# Patient Record
Sex: Female | Born: 1955 | Race: Black or African American | Hispanic: No | Marital: Married | Smoking: Former smoker
Health system: Southern US, Community
[De-identification: ages and names within clinical notes are randomized; demographics above are authoritative.]

## PROBLEM LIST (undated history)

## (undated) DIAGNOSIS — Z9989 Dependence on other enabling machines and devices: Secondary | ICD-10-CM

## (undated) DIAGNOSIS — M51379 Other intervertebral disc degeneration, lumbosacral region without mention of lumbar back pain or lower extremity pain: Secondary | ICD-10-CM

## (undated) DIAGNOSIS — Z803 Family history of malignant neoplasm of breast: Secondary | ICD-10-CM

## (undated) DIAGNOSIS — I517 Cardiomegaly: Secondary | ICD-10-CM

## (undated) DIAGNOSIS — E785 Hyperlipidemia, unspecified: Secondary | ICD-10-CM

## (undated) DIAGNOSIS — M5137 Other intervertebral disc degeneration, lumbosacral region: Secondary | ICD-10-CM

## (undated) DIAGNOSIS — C50919 Malignant neoplasm of unspecified site of unspecified female breast: Secondary | ICD-10-CM

## (undated) DIAGNOSIS — E039 Hypothyroidism, unspecified: Secondary | ICD-10-CM

## (undated) DIAGNOSIS — E0500 Thyrotoxicosis with diffuse goiter without thyrotoxic crisis or storm: Secondary | ICD-10-CM

## (undated) DIAGNOSIS — D72820 Lymphocytosis (symptomatic): Secondary | ICD-10-CM

## (undated) DIAGNOSIS — Z87442 Personal history of urinary calculi: Secondary | ICD-10-CM

## (undated) DIAGNOSIS — I10 Essential (primary) hypertension: Secondary | ICD-10-CM

## (undated) DIAGNOSIS — E119 Type 2 diabetes mellitus without complications: Secondary | ICD-10-CM

## (undated) DIAGNOSIS — N200 Calculus of kidney: Secondary | ICD-10-CM

## (undated) DIAGNOSIS — G4733 Obstructive sleep apnea (adult) (pediatric): Secondary | ICD-10-CM

## (undated) DIAGNOSIS — E278 Other specified disorders of adrenal gland: Secondary | ICD-10-CM

## (undated) DIAGNOSIS — M1990 Unspecified osteoarthritis, unspecified site: Secondary | ICD-10-CM

## (undated) DIAGNOSIS — Z800 Family history of malignant neoplasm of digestive organs: Secondary | ICD-10-CM

## (undated) HISTORY — DX: Unspecified osteoarthritis, unspecified site: M19.90

## (undated) HISTORY — DX: Obstructive sleep apnea (adult) (pediatric): Z99.89

## (undated) HISTORY — PX: LITHOTRIPSY: SUR834

## (undated) HISTORY — DX: Type 2 diabetes mellitus without complications: E11.9

## (undated) HISTORY — DX: Cardiomegaly: I51.7

## (undated) HISTORY — DX: Calculus of kidney: N20.0

## (undated) HISTORY — DX: Essential (primary) hypertension: I10

## (undated) HISTORY — PX: ABDOMINAL HYSTERECTOMY: SHX81

## (undated) HISTORY — DX: Hyperlipidemia, unspecified: E78.5

## (undated) HISTORY — DX: Family history of malignant neoplasm of digestive organs: Z80.0

## (undated) HISTORY — DX: Family history of malignant neoplasm of breast: Z80.3

## (undated) HISTORY — DX: Thyrotoxicosis with diffuse goiter without thyrotoxic crisis or storm: E05.00

## (undated) HISTORY — DX: Obstructive sleep apnea (adult) (pediatric): G47.33

## (undated) HISTORY — DX: Lymphocytosis (symptomatic): D72.820

## (undated) HISTORY — DX: Other specified disorders of adrenal gland: E27.8

---

## 1984-05-23 HISTORY — PX: OTHER SURGICAL HISTORY: SHX169

## 1996-10-21 HISTORY — PX: OTHER SURGICAL HISTORY: SHX169

## 2000-09-26 ENCOUNTER — Ambulatory Visit (HOSPITAL_COMMUNITY): Admission: RE | Admit: 2000-09-26 | Discharge: 2000-09-26 | Payer: Self-pay | Admitting: Occupational Therapy

## 2000-10-18 ENCOUNTER — Ambulatory Visit (HOSPITAL_COMMUNITY): Admission: RE | Admit: 2000-10-18 | Discharge: 2000-10-18 | Payer: Self-pay | Admitting: Cardiology

## 2000-10-18 ENCOUNTER — Encounter: Payer: Self-pay | Admitting: Internal Medicine

## 2000-11-13 ENCOUNTER — Ambulatory Visit (HOSPITAL_COMMUNITY): Admission: RE | Admit: 2000-11-13 | Discharge: 2000-11-13 | Payer: Self-pay | Admitting: Urology

## 2000-11-13 ENCOUNTER — Encounter: Payer: Self-pay | Admitting: Urology

## 2001-07-25 ENCOUNTER — Ambulatory Visit (HOSPITAL_COMMUNITY): Admission: RE | Admit: 2001-07-25 | Discharge: 2001-07-25 | Payer: Self-pay | Admitting: Obstetrics and Gynecology

## 2001-07-25 ENCOUNTER — Encounter: Payer: Self-pay | Admitting: Obstetrics and Gynecology

## 2001-08-08 ENCOUNTER — Other Ambulatory Visit: Admission: RE | Admit: 2001-08-08 | Discharge: 2001-08-08 | Payer: Self-pay | Admitting: Obstetrics and Gynecology

## 2001-08-24 ENCOUNTER — Encounter: Payer: Self-pay | Admitting: Obstetrics and Gynecology

## 2001-08-24 ENCOUNTER — Ambulatory Visit (HOSPITAL_COMMUNITY): Admission: RE | Admit: 2001-08-24 | Discharge: 2001-08-24 | Payer: Self-pay | Admitting: Obstetrics and Gynecology

## 2002-08-12 ENCOUNTER — Ambulatory Visit (HOSPITAL_COMMUNITY): Admission: RE | Admit: 2002-08-12 | Discharge: 2002-08-12 | Payer: Self-pay | Admitting: Internal Medicine

## 2002-08-12 ENCOUNTER — Encounter: Payer: Self-pay | Admitting: Obstetrics and Gynecology

## 2003-10-09 ENCOUNTER — Ambulatory Visit (HOSPITAL_COMMUNITY): Admission: RE | Admit: 2003-10-09 | Discharge: 2003-10-09 | Payer: Self-pay | Admitting: Obstetrics and Gynecology

## 2004-01-23 ENCOUNTER — Ambulatory Visit (HOSPITAL_COMMUNITY): Admission: RE | Admit: 2004-01-23 | Discharge: 2004-01-23 | Payer: Self-pay | Admitting: Occupational Therapy

## 2004-02-10 ENCOUNTER — Ambulatory Visit (HOSPITAL_COMMUNITY): Admission: RE | Admit: 2004-02-10 | Discharge: 2004-02-10 | Payer: Self-pay | Admitting: Urology

## 2004-02-11 ENCOUNTER — Ambulatory Visit (HOSPITAL_COMMUNITY): Admission: RE | Admit: 2004-02-11 | Discharge: 2004-02-11 | Payer: Self-pay | Admitting: Urology

## 2004-05-11 ENCOUNTER — Ambulatory Visit (HOSPITAL_COMMUNITY): Admission: RE | Admit: 2004-05-11 | Discharge: 2004-05-11 | Payer: Self-pay | Admitting: Urology

## 2004-05-13 ENCOUNTER — Ambulatory Visit: Payer: Self-pay | Admitting: Occupational Therapy

## 2004-07-12 ENCOUNTER — Ambulatory Visit (HOSPITAL_COMMUNITY): Admission: RE | Admit: 2004-07-12 | Discharge: 2004-07-12 | Payer: Self-pay | Admitting: Occupational Therapy

## 2004-11-05 ENCOUNTER — Ambulatory Visit (HOSPITAL_COMMUNITY): Admission: RE | Admit: 2004-11-05 | Discharge: 2004-11-05 | Payer: Self-pay | Admitting: Obstetrics and Gynecology

## 2005-05-05 ENCOUNTER — Ambulatory Visit (HOSPITAL_COMMUNITY): Admission: RE | Admit: 2005-05-05 | Discharge: 2005-05-05 | Payer: Self-pay | Admitting: General Surgery

## 2005-09-30 ENCOUNTER — Encounter (INDEPENDENT_AMBULATORY_CARE_PROVIDER_SITE_OTHER): Payer: Self-pay | Admitting: Internal Medicine

## 2005-11-21 ENCOUNTER — Ambulatory Visit (HOSPITAL_COMMUNITY): Admission: RE | Admit: 2005-11-21 | Discharge: 2005-11-21 | Payer: Self-pay | Admitting: Obstetrics and Gynecology

## 2005-12-16 ENCOUNTER — Ambulatory Visit (HOSPITAL_COMMUNITY): Admission: RE | Admit: 2005-12-16 | Discharge: 2005-12-16 | Payer: Self-pay | Admitting: Internal Medicine

## 2005-12-16 ENCOUNTER — Ambulatory Visit: Payer: Self-pay | Admitting: Internal Medicine

## 2006-03-27 ENCOUNTER — Ambulatory Visit: Payer: Self-pay | Admitting: Internal Medicine

## 2006-03-27 LAB — CONVERTED CEMR LAB
ALT: 35 units/L
AST: 30 units/L
Albumin: 4.1 g/dL
Alkaline Phosphatase: 113 units/L
BUN: 10 mg/dL
CO2: 26 meq/L
Calcium: 9.3 mg/dL
Chloride: 101 meq/L
Creatinine, Ser: 0.58 mg/dL
Free T4: 1.11 ng/dL
Glucose, Bld: 94 mg/dL
Microalb Creat Ratio: 23.8 mg/g
Microalb, Ur: 1.93 mg/dL
Potassium: 3.7 meq/L
Sodium: 144 meq/L
TSH: 3.623 microintl units/mL
Total Bilirubin: 0.4 mg/dL
Total Protein: 7.8 g/dL

## 2006-04-03 ENCOUNTER — Ambulatory Visit (HOSPITAL_COMMUNITY): Admission: RE | Admit: 2006-04-03 | Discharge: 2006-04-03 | Payer: Self-pay | Admitting: Internal Medicine

## 2006-04-05 DIAGNOSIS — I10 Essential (primary) hypertension: Secondary | ICD-10-CM | POA: Insufficient documentation

## 2006-04-05 DIAGNOSIS — E785 Hyperlipidemia, unspecified: Secondary | ICD-10-CM | POA: Insufficient documentation

## 2006-04-05 DIAGNOSIS — G4733 Obstructive sleep apnea (adult) (pediatric): Secondary | ICD-10-CM | POA: Insufficient documentation

## 2006-04-05 DIAGNOSIS — E782 Mixed hyperlipidemia: Secondary | ICD-10-CM | POA: Insufficient documentation

## 2006-04-05 DIAGNOSIS — E1169 Type 2 diabetes mellitus with other specified complication: Secondary | ICD-10-CM | POA: Insufficient documentation

## 2006-04-05 DIAGNOSIS — E278 Other specified disorders of adrenal gland: Secondary | ICD-10-CM | POA: Insufficient documentation

## 2006-04-05 DIAGNOSIS — E039 Hypothyroidism, unspecified: Secondary | ICD-10-CM | POA: Insufficient documentation

## 2006-04-05 DIAGNOSIS — E119 Type 2 diabetes mellitus without complications: Secondary | ICD-10-CM | POA: Insufficient documentation

## 2006-05-05 ENCOUNTER — Ambulatory Visit: Payer: Self-pay | Admitting: Internal Medicine

## 2006-05-05 LAB — CONVERTED CEMR LAB
Basophils Absolute: 0 10*3/uL
Basophils Relative: 0 %
Cholesterol: 179 mg/dL
Eosinophils Absolute: 0.2 10*3/uL
Eosinophils Relative: 3 %
HCT: 45.5 %
HDL: 49 mg/dL
Hemoglobin: 14.5 g/dL
LDL Cholesterol: 116 mg/dL
Lymphocytes Relative: 48 %
Lymphs Abs: 3.7 10*3/uL
MCHC: 31.9 g/dL
MCV: 82.4 fL
Monocytes Absolute: 0.4 10*3/uL
Monocytes Relative: 6 %
Neutro Abs: 3.3 10*3/uL
Neutrophils Relative %: 43 %
Platelets: 289 10*3/uL
RBC: 5.52 M/uL
RDW: 14.8 %
Total CHOL/HDL Ratio: 3.7
Triglycerides: 71 mg/dL
VLDL: 14 mg/dL
WBC: 7.7 10*3/uL

## 2006-06-02 ENCOUNTER — Ambulatory Visit: Payer: Self-pay | Admitting: Internal Medicine

## 2006-06-30 ENCOUNTER — Ambulatory Visit: Payer: Self-pay | Admitting: Internal Medicine

## 2006-06-30 LAB — CONVERTED CEMR LAB
Blood Glucose, AC Bkfst: 117 mg/dL
Cholesterol, target level: 200 mg/dL
HDL goal, serum: 40 mg/dL
Hgb A1c MFr Bld: 5.8 %
LDL Goal: 100 mg/dL

## 2006-07-01 ENCOUNTER — Encounter (INDEPENDENT_AMBULATORY_CARE_PROVIDER_SITE_OTHER): Payer: Self-pay | Admitting: Internal Medicine

## 2006-07-03 LAB — CONVERTED CEMR LAB
ALT: 23 units/L (ref 0–35)
AST: 21 units/L (ref 0–37)
Albumin: 4.3 g/dL (ref 3.5–5.2)
Alkaline Phosphatase: 129 units/L — ABNORMAL HIGH (ref 39–117)
Bilirubin, Direct: <0.1 mg/dL (ref 0.0–0.3)
Total Bilirubin: 0.6 mg/dL (ref 0.3–1.2)
Total Protein: 8.1 g/dL (ref 6.0–8.3)

## 2006-10-02 ENCOUNTER — Ambulatory Visit (HOSPITAL_COMMUNITY): Admission: RE | Admit: 2006-10-02 | Discharge: 2006-10-02 | Payer: Self-pay | Admitting: Internal Medicine

## 2006-10-02 ENCOUNTER — Ambulatory Visit: Payer: Self-pay | Admitting: Internal Medicine

## 2006-10-10 ENCOUNTER — Telehealth (INDEPENDENT_AMBULATORY_CARE_PROVIDER_SITE_OTHER): Payer: Self-pay | Admitting: Internal Medicine

## 2006-10-17 ENCOUNTER — Encounter (INDEPENDENT_AMBULATORY_CARE_PROVIDER_SITE_OTHER): Payer: Self-pay | Admitting: Internal Medicine

## 2006-10-24 ENCOUNTER — Encounter: Payer: Self-pay | Admitting: Internal Medicine

## 2006-10-30 ENCOUNTER — Ambulatory Visit: Payer: Self-pay | Admitting: Internal Medicine

## 2006-10-31 ENCOUNTER — Telehealth (INDEPENDENT_AMBULATORY_CARE_PROVIDER_SITE_OTHER): Payer: Self-pay | Admitting: *Deleted

## 2006-10-31 LAB — CONVERTED CEMR LAB
ALT: 19 units/L (ref 0–35)
AST: 18 units/L (ref 0–37)
Albumin: 4.1 g/dL (ref 3.5–5.2)
Alkaline Phosphatase: 119 units/L — ABNORMAL HIGH (ref 39–117)
BUN: 15 mg/dL (ref 6–23)
Basophils Absolute: 0 10*3/uL (ref 0.0–0.1)
Basophils Relative: 0 % (ref 0–1)
CO2: 28 meq/L (ref 19–32)
Calcium: 9.2 mg/dL (ref 8.4–10.5)
Chloride: 99 meq/L (ref 96–112)
Cholesterol: 182 mg/dL (ref 0–200)
Cortisol, Plasma: 6.1 ug/dL
Creatinine, Ser: 0.57 mg/dL (ref 0.40–1.20)
Eosinophils Absolute: 0.2 10*3/uL (ref 0.0–0.7)
Eosinophils Relative: 3 % (ref 0–5)
Glucose, Bld: 99 mg/dL (ref 70–99)
HCT: 42.6 % (ref 36.0–46.0)
HDL: 54 mg/dL (ref >39)
Hemoglobin: 13.8 g/dL (ref 12.0–15.0)
LDL Cholesterol: 117 mg/dL — ABNORMAL HIGH (ref 0–99)
Lymphocytes Relative: 46 % (ref 12–46)
Lymphs Abs: 3.4 10*3/uL — ABNORMAL HIGH (ref 0.7–3.3)
MCHC: 32.4 g/dL (ref 30.0–36.0)
MCV: 82.1 fL (ref 78.0–100.0)
Monocytes Absolute: 0.4 10*3/uL (ref 0.2–0.7)
Monocytes Relative: 6 % (ref 3–11)
Neutro Abs: 3.3 10*3/uL (ref 1.7–7.7)
Neutrophils Relative %: 45 % (ref 43–77)
Platelets: 261 10*3/uL (ref 150–400)
Potassium: 3.9 meq/L (ref 3.5–5.3)
RBC: 5.19 M/uL — ABNORMAL HIGH (ref 3.87–5.11)
RDW: 14.4 % — ABNORMAL HIGH (ref 11.5–14.0)
Sodium: 140 meq/L (ref 135–145)
Total Bilirubin: 0.5 mg/dL (ref 0.3–1.2)
Total CHOL/HDL Ratio: 3.4
Total Protein: 7.9 g/dL (ref 6.0–8.3)
Triglycerides: 57 mg/dL (ref <150)
VLDL: 11 mg/dL (ref 0–40)
WBC: 7.3 10*3/uL (ref 4.0–10.5)

## 2006-12-19 ENCOUNTER — Ambulatory Visit (HOSPITAL_COMMUNITY): Admission: RE | Admit: 2006-12-19 | Discharge: 2006-12-19 | Payer: Self-pay | Admitting: Internal Medicine

## 2006-12-21 ENCOUNTER — Telehealth (INDEPENDENT_AMBULATORY_CARE_PROVIDER_SITE_OTHER): Payer: Self-pay | Admitting: *Deleted

## 2007-04-13 ENCOUNTER — Ambulatory Visit: Payer: Self-pay | Admitting: Internal Medicine

## 2007-04-13 LAB — CONVERTED CEMR LAB
Blood Glucose, Fingerstick: 97
Hgb A1c MFr Bld: 5.9 %

## 2007-04-15 LAB — CONVERTED CEMR LAB
BUN: 14 mg/dL (ref 6–23)
CO2: 30 meq/L (ref 19–32)
Calcium: 9.5 mg/dL (ref 8.4–10.5)
Chloride: 104 meq/L (ref 96–112)
Creatinine, Ser: 0.48 mg/dL (ref 0.40–1.20)
Creatinine, Urine: 87 mg/dL
Free T4: 1.51 ng/dL (ref 0.89–1.80)
Glucose, Bld: 89 mg/dL (ref 70–99)
Microalb Creat Ratio: 18 mg/g (ref 0.0–30.0)
Microalb, Ur: 1.57 mg/dL (ref 0.00–1.89)
Potassium: 3.8 meq/L (ref 3.5–5.3)
Sodium: 144 meq/L (ref 135–145)
TSH: 0.007 microintl units/mL — ABNORMAL LOW (ref 0.350–5.50)

## 2007-04-16 ENCOUNTER — Telehealth (INDEPENDENT_AMBULATORY_CARE_PROVIDER_SITE_OTHER): Payer: Self-pay | Admitting: *Deleted

## 2007-08-17 ENCOUNTER — Encounter (INDEPENDENT_AMBULATORY_CARE_PROVIDER_SITE_OTHER): Payer: Self-pay | Admitting: Internal Medicine

## 2007-11-05 ENCOUNTER — Encounter (INDEPENDENT_AMBULATORY_CARE_PROVIDER_SITE_OTHER): Payer: Self-pay | Admitting: Internal Medicine

## 2007-12-18 ENCOUNTER — Encounter (INDEPENDENT_AMBULATORY_CARE_PROVIDER_SITE_OTHER): Payer: Self-pay | Admitting: Internal Medicine

## 2008-03-07 ENCOUNTER — Ambulatory Visit: Payer: Self-pay | Admitting: Internal Medicine

## 2008-03-14 ENCOUNTER — Ambulatory Visit (HOSPITAL_COMMUNITY): Admission: RE | Admit: 2008-03-14 | Discharge: 2008-03-14 | Payer: Self-pay | Admitting: Internal Medicine

## 2008-04-23 ENCOUNTER — Ambulatory Visit: Payer: Self-pay | Admitting: Internal Medicine

## 2008-04-25 ENCOUNTER — Encounter (INDEPENDENT_AMBULATORY_CARE_PROVIDER_SITE_OTHER): Payer: Self-pay | Admitting: Internal Medicine

## 2008-04-25 LAB — CONVERTED CEMR LAB
Albumin: 3.9 g/dL (ref 3.5–5.2)
Alkaline Phosphatase: 109 units/L (ref 39–117)
BUN: 15 mg/dL (ref 6–23)
Calcium: 9 mg/dL (ref 8.4–10.5)
Chloride: 103 meq/L (ref 96–112)
Glucose, Bld: 90 mg/dL (ref 70–99)
Potassium: 3.4 meq/L — ABNORMAL LOW (ref 3.5–5.3)
Sodium: 142 meq/L (ref 135–145)
Total Protein: 7.1 g/dL (ref 6.0–8.3)

## 2008-06-02 ENCOUNTER — Encounter (INDEPENDENT_AMBULATORY_CARE_PROVIDER_SITE_OTHER): Payer: Self-pay | Admitting: Internal Medicine

## 2008-06-13 ENCOUNTER — Ambulatory Visit: Payer: Self-pay | Admitting: Internal Medicine

## 2008-06-13 LAB — CONVERTED CEMR LAB
Blood Glucose, Fingerstick: 113
Ketones, urine, test strip: NEGATIVE
Nitrite: NEGATIVE
Urobilinogen, UA: 0.2
WBC Urine, dipstick: NEGATIVE

## 2008-06-17 LAB — CONVERTED CEMR LAB
Creatinine, Urine: 179.6 mg/dL
HDL: 54 mg/dL (ref >39)
Microalb Creat Ratio: 22.8 mg/g (ref 0.0–30.0)
Total CHOL/HDL Ratio: 3.1
Triglycerides: 61 mg/dL (ref <150)

## 2008-06-25 ENCOUNTER — Encounter (INDEPENDENT_AMBULATORY_CARE_PROVIDER_SITE_OTHER): Payer: Self-pay | Admitting: Internal Medicine

## 2008-10-03 ENCOUNTER — Ambulatory Visit: Payer: Self-pay | Admitting: Internal Medicine

## 2008-10-10 ENCOUNTER — Ambulatory Visit: Payer: Self-pay | Admitting: Cardiology

## 2008-10-10 ENCOUNTER — Encounter (INDEPENDENT_AMBULATORY_CARE_PROVIDER_SITE_OTHER): Payer: Self-pay | Admitting: Internal Medicine

## 2008-10-10 ENCOUNTER — Ambulatory Visit (HOSPITAL_COMMUNITY): Admission: RE | Admit: 2008-10-10 | Discharge: 2008-10-10 | Payer: Self-pay | Admitting: Internal Medicine

## 2008-11-03 ENCOUNTER — Ambulatory Visit: Payer: Self-pay | Admitting: Internal Medicine

## 2008-11-21 ENCOUNTER — Ambulatory Visit: Payer: Self-pay | Admitting: Internal Medicine

## 2009-01-02 ENCOUNTER — Encounter (INDEPENDENT_AMBULATORY_CARE_PROVIDER_SITE_OTHER): Payer: Self-pay | Admitting: Internal Medicine

## 2009-04-22 ENCOUNTER — Ambulatory Visit (HOSPITAL_COMMUNITY): Admission: RE | Admit: 2009-04-22 | Discharge: 2009-04-22 | Payer: Self-pay | Admitting: Obstetrics and Gynecology

## 2009-05-23 HISTORY — PX: COLONOSCOPY: SHX174

## 2009-05-23 HISTORY — PX: HERNIA REPAIR: SHX51

## 2010-04-26 ENCOUNTER — Ambulatory Visit (HOSPITAL_COMMUNITY)
Admission: RE | Admit: 2010-04-26 | Discharge: 2010-04-26 | Payer: Self-pay | Source: Home / Self Care | Admitting: Family Medicine

## 2010-06-12 ENCOUNTER — Encounter: Payer: Self-pay | Admitting: General Surgery

## 2010-06-13 ENCOUNTER — Encounter: Payer: Self-pay | Admitting: Obstetrics and Gynecology

## 2010-10-08 NOTE — Op Note (Signed)
Frances Hughes, Frances Hughes              ACCOUNT NO.:  [id number]   MEDICAL RECORD NO.:  [id number]          PATIENT TYPE:  AMB   LOCATION:  DAY                           FACILITY:  [facility]   PHYSICIAN:  R. Roetta Sessions, M.D. DATE OF BIRTH:  Mar 01, 1956   DATE OF PROCEDURE:  12/16/2005  DATE OF DISCHARGE:                                  PROCEDURE NOTE   PROCEDURE:  Screening colonoscopy.   ENDOSCOPIST:  Jonathon Bellows, M.D.   INDICATION FOR PROCEDURE:  The patient is a 55 year old lady sent over at  the courtesy of Dr. Christin Bach for colorectal cancer screening.  She is  devoid of any lower GI symptoms.  She has never had the lower GI tract  imaged.  Family history is positive in that her mother reportedly had colon  cancer contained to polyps, which were removed previously.  Colonoscopy is  now being done as a screening maneuver.  This approach has been discussed  with the patient at length and potential risks, benefits and alternatives  have been reviewed and questions answered; she is agreeable.  Please  documentation in the medical record.   PROCEDURAL NOTE:  O2 saturation, blood pressure, pulse and respirations were  monitored throughout entire procedure.   CONSCIOUS SEDATION:  Versed 5 mg IV, Demerol 100 mg in divided doses.   INSTRUMENT:  Olympus video chip system.   FINDINGS:  Digital rectal exam revealed no abnormalities.   ENDOSCOPIC FINDINGS:  Prep was good.   RECTUM:  Examination of the rectal mucosa including a retroflexed view of  the anal verge revealed no abnormalities.   COLON:  Colonic mucosa was surveyed from the rectosigmoid junction through  the left, transverse and right colon to the area of the appendiceal orifice  and ileocecal valve and cecum.  These structures were well-seen and  photographed for the record.  From this level, the scope was slowly and  cautiously withdrawn.  All previously imaged mucosal surfaces were again  seen.  The colonic mucosa  appeared normal.  It is notable that in the area  of the hepatic flexure the colon appeared somewhat twisted or extrinsically  compressed, perhaps by a hernia or an anatomical variant.  The lumen was  narrowed at this level and it was affixed, but there was no stricture and  the mucosa appeared entirely normal through it.  The remainder of the  colonic mucosa also appeared normal.   The patient tolerated the procedure well and was reacted at endoscopy.   IMPRESSION:  1.  Normal rectum.  2.  Elongated, tortuous colon.  3.  Narrowing versus some torsion of the colon at the area of the hepatic      flexure suggestive of possibly a hernia encroaching on the hepatic      flexure or simply torsion/anatomic variant, mucosal findings as noted      above.   RECOMMENDATIONS:  Because it sounds like her mother did have early colon  cancer, I would recommend this lady come back for a repeat screening  colonoscopy in 5 years.      R.  Roetta Sessions, M.D.  Electronically Signed     RMR/MEDQ  D:  12/16/2005  T:  12/16/2005  Job:  [id number]   cc:   Tilda Burrow, M.D.  Fax: [phone]

## 2010-11-26 ENCOUNTER — Encounter: Payer: Self-pay | Admitting: Internal Medicine

## 2010-12-07 ENCOUNTER — Ambulatory Visit: Payer: Self-pay | Admitting: Urgent Care

## 2010-12-07 ENCOUNTER — Telehealth: Payer: Self-pay | Admitting: Urgent Care

## 2010-12-22 NOTE — Telephone Encounter (Signed)
Routed to Provider

## 2011-01-27 ENCOUNTER — Ambulatory Visit (INDEPENDENT_AMBULATORY_CARE_PROVIDER_SITE_OTHER): Payer: Managed Care, Other (non HMO) | Admitting: Family Medicine

## 2011-01-27 ENCOUNTER — Encounter: Payer: Self-pay | Admitting: Family Medicine

## 2011-01-27 VITALS — BP 100/70 | HR 66 | Resp 16 | Ht 63.0 in | Wt 269.1 lb

## 2011-01-27 DIAGNOSIS — J189 Pneumonia, unspecified organism: Secondary | ICD-10-CM

## 2011-01-27 DIAGNOSIS — I10 Essential (primary) hypertension: Secondary | ICD-10-CM

## 2011-01-27 DIAGNOSIS — E1149 Type 2 diabetes mellitus with other diabetic neurological complication: Secondary | ICD-10-CM

## 2011-01-27 DIAGNOSIS — G4733 Obstructive sleep apnea (adult) (pediatric): Secondary | ICD-10-CM

## 2011-01-27 DIAGNOSIS — E039 Hypothyroidism, unspecified: Secondary | ICD-10-CM

## 2011-01-27 NOTE — Assessment & Plan Note (Addendum)
Complete Levaquin course for PNA, oxygen sat normal, repeat x-ray in 6 weeks, pt has history of recurrent PNA

## 2011-01-27 NOTE — Patient Instructions (Signed)
Come back in 6 weeks for recheck on Pneumonia If you get worse then please call You can get your flu shot later this month I will get your records from your previous doctor

## 2011-01-27 NOTE — Assessment & Plan Note (Signed)
Continue Synthroid °

## 2011-01-27 NOTE — Assessment & Plan Note (Signed)
She does not wear CPAP as prescribed

## 2011-01-27 NOTE — Assessment & Plan Note (Signed)
Continue meds. 

## 2011-01-27 NOTE — Assessment & Plan Note (Signed)
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

## 2011-01-27 NOTE — Progress Notes (Signed)
  Subjective:    [name], female    DOB: [birth year], 55 y.o.   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 Rito Ehrlich   Hypothryroidsim- taking Synthroid, initially started as Graves disease in 1986- was on PTU, radiaoactive iodine then became hypothyroidism    ? History of Diabetes Mellitus- had A1C done which have been normal--   HTN- BP has been well controlled recently ,on diuretic for blood pressure    Adrenal mass- left side 4 years, has not had follow-up that she knows   Had hernia repair- had difficulty with anesthesia and swelling in throat  - UTD Mammogram  No vitamins       Review of Systems     GEN- denies fatigue, fever, weight loss,weakness, +recent illness HEENT- denies eye drainage, change in vision, nasal discharge, CVS- denies chest pain, palpitations RESP- denies SOB, +cough, denies wheeze ABD- denies N/V, change in stools, abd pain MSK- denies joint pain, muscle aches, injury Neuro- denies headache, dizziness, syncope, seizure activity      Objective:   Physical Exam   GEN- NAD, alert and oriented x3, obese HEENT- PERRL, EOMI, non injected sclera, pink conjunctiva, MMM, oropharynx clear Neck- Supple, no thryomegaly Nodes- no LAD CVS- RRR, no murmur RESP-rhonchi right base, harsh cough , normal WOB at rest EXT- 1+ pedal edema Pulses- Radial, DP- 2+ Feet- no open lesions, thick nails       Assessment & Plan:

## 2011-01-31 ENCOUNTER — Encounter: Payer: Self-pay | Admitting: Family Medicine

## 2011-01-31 NOTE — Progress Notes (Unsigned)
  Subjective:    [name], female    DOB: 02-09-1956, 55 y.o.   MRN: [id number]  HPI    Review of Systems     Objective:   Physical Exam        Assessment & Plan:    Reviewed note from PCP   A1C 5.9% on 12/17/10

## 2011-03-11 ENCOUNTER — Telehealth: Payer: Self-pay | Admitting: Family Medicine

## 2011-03-11 ENCOUNTER — Ambulatory Visit (HOSPITAL_COMMUNITY)
Admission: RE | Admit: 2011-03-11 | Discharge: 2011-03-11 | Disposition: A | Discharge status: Home / Self Care | Payer: Managed Care, Other (non HMO) | Source: Ambulatory Visit | Attending: Family Medicine | Admitting: Family Medicine

## 2011-03-11 ENCOUNTER — Ambulatory Visit (INDEPENDENT_AMBULATORY_CARE_PROVIDER_SITE_OTHER): Payer: Managed Care, Other (non HMO) | Admitting: Family Medicine

## 2011-03-11 ENCOUNTER — Encounter: Payer: Self-pay | Admitting: Family Medicine

## 2011-03-11 VITALS — BP 160/96 | HR 73 | Resp 16 | Ht 63.0 in | Wt 271.0 lb

## 2011-03-11 DIAGNOSIS — J189 Pneumonia, unspecified organism: Secondary | ICD-10-CM

## 2011-03-11 DIAGNOSIS — R059 Cough, unspecified: Secondary | ICD-10-CM | POA: Insufficient documentation

## 2011-03-11 DIAGNOSIS — E785 Hyperlipidemia, unspecified: Secondary | ICD-10-CM

## 2011-03-11 DIAGNOSIS — E039 Hypothyroidism, unspecified: Secondary | ICD-10-CM

## 2011-03-11 DIAGNOSIS — E1149 Type 2 diabetes mellitus with other diabetic neurological complication: Secondary | ICD-10-CM

## 2011-03-11 DIAGNOSIS — L905 Scar conditions and fibrosis of skin: Secondary | ICD-10-CM

## 2011-03-11 DIAGNOSIS — R05 Cough: Secondary | ICD-10-CM | POA: Insufficient documentation

## 2011-03-11 DIAGNOSIS — I10 Essential (primary) hypertension: Secondary | ICD-10-CM

## 2011-03-11 NOTE — Telephone Encounter (Signed)
Called pt , given neg CXR results

## 2011-03-11 NOTE — Patient Instructions (Addendum)
Try to get a blood pressure cuff--  Get your labs done- do not eat after midnight-  Get the x-ray done Use Cortisone over the counter If it flares up let us know  We will call with lab results Get your flu shot from work Next visit in 4 months

## 2011-03-11 NOTE — Progress Notes (Signed)
  Subjective:    [name], female    DOB: Aug 14, 1955, 55 y.o.   MRN: [id number]  HPI  Pneumonia- now has a dry cough, no fever, no SOB, no CP   Scar- has scar on chest present for many months after a hair follicle was infected   HTN- took all blood pressure medications today, does not take BP at home  Hypotension- synthroid dose has not been changed  [phone]- [place]   Has been traveling back and forth to [place] because sister is there with colon cancer  Will get flu shot at work Review of Systems  GEN- + fatigue, denies fever, weight loss,weakness, +recent illness CVS- denies chest pain, palpitations RESP- per above ABD- denies N/V, change in stools, abd pain Neuro- denies headache, dizziness,      Objective:   Physical Exam  GEN- NAD, alert and oriented x3, obese Neck- Supple, no thryomegaly CVS- RRR, no murmur RESP-CTAB, normal WOB at rest EXT- trace pedal edema Pulses- Radial, DP- 2+ Skin- small 4mm hyperpigmented scar located on center of chest, with mild hypertrophic skin surrounding- no hair follicule seen, NT, no erythema      Assessment & Plan:

## 2011-03-13 ENCOUNTER — Encounter: Payer: Self-pay | Admitting: Family Medicine

## 2011-03-13 DIAGNOSIS — L905 Scar conditions and fibrosis of skin: Secondary | ICD-10-CM | POA: Insufficient documentation

## 2011-03-13 NOTE — Assessment & Plan Note (Signed)
Diet controlled, last A1C normal

## 2011-03-13 NOTE — Assessment & Plan Note (Signed)
Continue meds ROutine labs to be obtained

## 2011-03-13 NOTE — Assessment & Plan Note (Signed)
Initial BP was normal at first visit, BP quite elevated today, no change to medications Pt to get BP cuff at home and record. Note she did not take Lasix

## 2011-03-13 NOTE — Assessment & Plan Note (Signed)
Persistent cough, likely post infectious, will obtain CXR  CXR-neg for acute process, PNA resolved

## 2011-03-13 NOTE — Assessment & Plan Note (Signed)
Appears to be old scar from follicilitis/ingrown hair, cortisone prn itch, hypertrophied

## 2011-03-13 NOTE — Assessment & Plan Note (Signed)
Check FLP 

## 2011-05-31 ENCOUNTER — Other Ambulatory Visit: Payer: Self-pay | Admitting: Family Medicine

## 2011-05-31 DIAGNOSIS — Z139 Encounter for screening, unspecified: Secondary | ICD-10-CM

## 2011-06-06 ENCOUNTER — Ambulatory Visit (HOSPITAL_COMMUNITY)
Admission: RE | Admit: 2011-06-06 | Discharge: 2011-06-06 | Disposition: A | Discharge status: Home / Self Care | Payer: Managed Care, Other (non HMO) | Source: Ambulatory Visit | Attending: Family Medicine | Admitting: Family Medicine

## 2011-06-06 DIAGNOSIS — Z1231 Encounter for screening mammogram for malignant neoplasm of breast: Secondary | ICD-10-CM | POA: Insufficient documentation

## 2011-06-06 DIAGNOSIS — Z139 Encounter for screening, unspecified: Secondary | ICD-10-CM

## 2011-07-15 ENCOUNTER — Ambulatory Visit (INDEPENDENT_AMBULATORY_CARE_PROVIDER_SITE_OTHER): Payer: Managed Care, Other (non HMO) | Admitting: Family Medicine

## 2011-07-15 ENCOUNTER — Encounter: Payer: Self-pay | Admitting: Family Medicine

## 2011-07-15 VITALS — BP 140/96 | HR 89 | Resp 16 | Ht 63.0 in | Wt 274.0 lb

## 2011-07-15 DIAGNOSIS — E1142 Type 2 diabetes mellitus with diabetic polyneuropathy: Secondary | ICD-10-CM

## 2011-07-15 DIAGNOSIS — F32A Depression, unspecified: Secondary | ICD-10-CM

## 2011-07-15 DIAGNOSIS — R220 Localized swelling, mass and lump, head: Secondary | ICD-10-CM

## 2011-07-15 DIAGNOSIS — E039 Hypothyroidism, unspecified: Secondary | ICD-10-CM

## 2011-07-15 DIAGNOSIS — F3289 Other specified depressive episodes: Secondary | ICD-10-CM

## 2011-07-15 DIAGNOSIS — R221 Localized swelling, mass and lump, neck: Secondary | ICD-10-CM | POA: Insufficient documentation

## 2011-07-15 DIAGNOSIS — G4733 Obstructive sleep apnea (adult) (pediatric): Secondary | ICD-10-CM

## 2011-07-15 DIAGNOSIS — E278 Other specified disorders of adrenal gland: Secondary | ICD-10-CM

## 2011-07-15 DIAGNOSIS — F329 Major depressive disorder, single episode, unspecified: Secondary | ICD-10-CM

## 2011-07-15 DIAGNOSIS — E785 Hyperlipidemia, unspecified: Secondary | ICD-10-CM

## 2011-07-15 DIAGNOSIS — E1149 Type 2 diabetes mellitus with other diabetic neurological complication: Secondary | ICD-10-CM

## 2011-07-15 DIAGNOSIS — E669 Obesity, unspecified: Secondary | ICD-10-CM

## 2011-07-15 DIAGNOSIS — L905 Scar conditions and fibrosis of skin: Secondary | ICD-10-CM

## 2011-07-15 DIAGNOSIS — E559 Vitamin D deficiency, unspecified: Secondary | ICD-10-CM

## 2011-07-15 DIAGNOSIS — I10 Essential (primary) hypertension: Secondary | ICD-10-CM

## 2011-07-15 HISTORY — DX: Depression, unspecified: F32.A

## 2011-07-15 MED ORDER — FUROSEMIDE 20 MG PO TABS: 20.0000 mg | ORAL_TABLET | ORAL | Status: DC | PRN

## 2011-07-15 MED ORDER — BUPROPION HCL ER (XL) 150 MG PO TB24: 150.0000 mg | ORAL_TABLET | ORAL | Status: DC

## 2011-07-15 MED ORDER — LEVOTHYROXINE SODIUM 200 MCG PO TABS: 200.0000 ug | ORAL_TABLET | Freq: Every day | ORAL | Status: DC

## 2011-07-15 MED ORDER — CHLORTHALIDONE 25 MG PO TABS: 25.0000 mg | ORAL_TABLET | Freq: Every day | ORAL | Status: DC

## 2011-07-15 MED ORDER — ALLOPURINOL 100 MG PO TABS: 100.0000 mg | ORAL_TABLET | Freq: Every day | ORAL | Status: DC

## 2011-07-15 MED ORDER — LOSARTAN POTASSIUM 100 MG PO TABS: 100.0000 mg | ORAL_TABLET | Freq: Every day | ORAL | Status: DC

## 2011-07-15 MED ORDER — CARVEDILOL 12.5 MG PO TABS: 12.5000 mg | ORAL_TABLET | Freq: Two times a day (BID) | ORAL | Status: DC

## 2011-07-15 NOTE — Assessment & Plan Note (Signed)
Persistent hypertrophic scar, I think this is due to ingrown follicle, though not infected today. Will send to dermatology, pt wishes to have this removed

## 2011-07-15 NOTE — Assessment & Plan Note (Signed)
Increased daytime fatigue, which is likley multifactorial based on OSA, depression, HTN. Will need to restart CPAP machine use, will send an order to [employer].

## 2011-07-15 NOTE — Assessment & Plan Note (Signed)
Obtain records from [facility], to determine when she will need surveillance scan again.

## 2011-07-15 NOTE — Assessment & Plan Note (Signed)
Previously diet controlled, obtain A1C today

## 2011-07-15 NOTE — Assessment & Plan Note (Signed)
Increase Co-reg to 12.5mg  bid , continue other medications

## 2011-07-15 NOTE — Assessment & Plan Note (Signed)
FLP to be done today

## 2011-07-15 NOTE — Assessment & Plan Note (Addendum)
Pt to start walking program, she is motivated to lose weight again as she has done in the past, but finding time with her schedule is difficult. She will also walk some with her co-workers

## 2011-07-15 NOTE — Patient Instructions (Addendum)
Walk 4 times a week- Try to get a pedometer  I will send you for an ultrasound of the neck Start the wellbutrin I have increased your Co-reg to 12.5mg  twice a day I will get records from the [facility]  We will call with lab results  I will refer you to Dermatology F/U 4 weeks

## 2011-07-15 NOTE — Assessment & Plan Note (Signed)
Obtain neck ultrasound

## 2011-07-15 NOTE — Progress Notes (Signed)
  Subjective:    [name], female    DOB: 1955-06-08, 56 y.o.   MRN: [id number]  HPI   Pt here for routine visit, feels very fatigued during the day. Since our last visit her sister died. She has been having a very difficult time with this. She finds herself worrying about her own health, will even sleep in a recline at night thinking it will avoid stroke symptoms. She is down and does not feel like her self, she is often sad and cries. Her sleep has been interrupted lately. She has her other siblings for support.   Obesity- she has been down on herself because of weight gain. She is not watching her foods or exercising. She has lost 60lbs in the past while working with a weight loss physician with portion control  HTN- taking bp meds as prescribed, she does not take lasix very often  Adrenal mass- she believes this was last looked at in 2011 at a hospital [facility], she has had two 24 hour urine test but does not recall any results.  Hypothyroidism- her dentist asked for her neck to  Be evaluated, he has noticed increased fullness in the thyroid region, has last ultrasound of neck was 2006, where she had abnormal lymph tissue. She did note at here surgery a few years ago, they had difficulty passing the ET tube and she was evaluated by ENT but everything was normal.   DM- diet controlled  Scar- pt has notice flare up of lesion on her chest and would like it removed   Review of Systems  GEN- + fatigue, fever, weight loss,weakness, recent illness HEENT- denies eye drainage, change in vision, nasal discharge, CVS- denies chest pain, palpitations RESP- occ SOB, cough, wheeze ABD- denies N/V, change in stools, abd pain GU- denies dysuria, hematuria, dribbling, incontinence MSK- denies joint pain, muscle aches, injury Neuro- denies headache, dizziness, syncope, seizure activity      Objective:   Physical Exam GEN- NAD, alert and oriented x3, obese HEENT-  PERRL, EOMI, oropharynx clear, MMM Neck- Supple, fullness in thyroid region, no specific nodule palpated CVS- RRR, no murmur RESP-CTAB, normal WOB at rest EXT- +pedal edema Pulses- Radial, DP- 2+ Skin- small 4mm hyperpigmented scar located on center of chest, with mild hypertrophic skin surrounding- n+hair follicule seen, NT, no erythema Psych- depressed appearing, crying during exam, not anxious appearing, no apparent SI        Assessment & Plan:

## 2011-07-15 NOTE — Assessment & Plan Note (Signed)
Pt is suffering from the lost of her sister 3 months ago, she is definitely out of her element as compared to her previous visits, I think this is also contributing to her complaint of fatigue. Will start Wellbutrin, she will call if she has any adverse side effects from medication. She will also start walking at work

## 2011-07-15 NOTE — Assessment & Plan Note (Signed)
Obtain ultrasound of thyroid, she is s/p radioactive uptake approx 25 years ago, but has concern on exam. Obtain thyroid studies

## 2011-07-16 LAB — COMPREHENSIVE METABOLIC PANEL
ALT: 21 U/L (ref 0–35)
AST: 20 U/L (ref 0–37)
Albumin: 3.7 g/dL (ref 3.5–5.2)
Alkaline Phosphatase: 85 U/L (ref 39–117)
BUN: 19 mg/dL (ref 6–23)
Creat: 0.67 mg/dL (ref 0.50–1.10)
Potassium: 3.8 mEq/L (ref 3.5–5.3)

## 2011-07-16 LAB — LIPID PANEL
Cholesterol: 171 mg/dL (ref 0–200)
LDL Cholesterol: 107 mg/dL — ABNORMAL HIGH (ref 0–99)
Total CHOL/HDL Ratio: 3.4 Ratio
VLDL: 14 mg/dL (ref 0–40)

## 2011-07-16 LAB — CBC
HCT: 41.6 % (ref 36.0–46.0)
Hemoglobin: 12.8 g/dL (ref 12.0–15.0)
MCH: 25.4 pg — ABNORMAL LOW (ref 26.0–34.0)
MCHC: 30.8 g/dL (ref 30.0–36.0)
RDW: 15.3 % (ref 11.5–15.5)

## 2011-07-17 LAB — VITAMIN D 1,25 DIHYDROXY: Vitamin D2 1, 25 (OH)2: <8 pg/mL

## 2011-07-19 ENCOUNTER — Ambulatory Visit (HOSPITAL_COMMUNITY): Payer: Managed Care, Other (non HMO)

## 2011-07-19 ENCOUNTER — Ambulatory Visit (HOSPITAL_COMMUNITY)
Admission: RE | Admit: 2011-07-19 | Discharge: 2011-07-19 | Disposition: A | Discharge status: Home / Self Care | Payer: Managed Care, Other (non HMO) | Source: Ambulatory Visit | Attending: Family Medicine | Admitting: Family Medicine

## 2011-07-19 DIAGNOSIS — E039 Hypothyroidism, unspecified: Secondary | ICD-10-CM

## 2011-07-19 DIAGNOSIS — R220 Localized swelling, mass and lump, head: Secondary | ICD-10-CM | POA: Insufficient documentation

## 2011-08-04 ENCOUNTER — Telehealth: Payer: Self-pay | Admitting: Family Medicine

## 2011-08-05 NOTE — Telephone Encounter (Signed)
Script written, dx 250.00, 278.00

## 2011-08-05 NOTE — Telephone Encounter (Signed)
Faxed over rx and left voicemail notifying pt that rx has been sent.

## 2011-08-26 ENCOUNTER — Other Ambulatory Visit: Payer: Self-pay | Admitting: Family Medicine

## 2011-09-29 ENCOUNTER — Other Ambulatory Visit: Payer: Self-pay | Admitting: Family Medicine

## 2011-11-11 ENCOUNTER — Ambulatory Visit: Payer: Managed Care, Other (non HMO) | Admitting: Family Medicine

## 2011-12-19 ENCOUNTER — Telehealth: Payer: Self-pay | Admitting: Family Medicine

## 2012-01-02 NOTE — Telephone Encounter (Signed)
Called and left message for pt to return call.  

## 2012-01-06 ENCOUNTER — Encounter: Payer: Self-pay | Admitting: Family Medicine

## 2012-01-06 ENCOUNTER — Ambulatory Visit (INDEPENDENT_AMBULATORY_CARE_PROVIDER_SITE_OTHER): Payer: Managed Care, Other (non HMO) | Admitting: Family Medicine

## 2012-01-06 VITALS — BP 140/76 | HR 78 | Resp 18 | Ht 63.0 in | Wt 287.1 lb

## 2012-01-06 DIAGNOSIS — E1149 Type 2 diabetes mellitus with other diabetic neurological complication: Secondary | ICD-10-CM

## 2012-01-06 DIAGNOSIS — E669 Obesity, unspecified: Secondary | ICD-10-CM

## 2012-01-06 DIAGNOSIS — G4733 Obstructive sleep apnea (adult) (pediatric): Secondary | ICD-10-CM

## 2012-01-06 DIAGNOSIS — E1142 Type 2 diabetes mellitus with diabetic polyneuropathy: Secondary | ICD-10-CM

## 2012-01-06 DIAGNOSIS — F3289 Other specified depressive episodes: Secondary | ICD-10-CM

## 2012-01-06 DIAGNOSIS — E278 Other specified disorders of adrenal gland: Secondary | ICD-10-CM

## 2012-01-06 DIAGNOSIS — I10 Essential (primary) hypertension: Secondary | ICD-10-CM

## 2012-01-06 DIAGNOSIS — F329 Major depressive disorder, single episode, unspecified: Secondary | ICD-10-CM

## 2012-01-06 DIAGNOSIS — E119 Type 2 diabetes mellitus without complications: Secondary | ICD-10-CM

## 2012-01-06 DIAGNOSIS — F32A Depression, unspecified: Secondary | ICD-10-CM

## 2012-01-06 MED ORDER — VENLAFAXINE HCL ER 37.5 MG PO CP24: 37.5000 mg | ORAL_CAPSULE | Freq: Every day | ORAL | Status: DC

## 2012-01-06 NOTE — Progress Notes (Signed)
  Subjective:    [name], female    DOB: 1955-08-01, 56 y.o.   MRN: [id number]  HPI Patient originally came for physical exam however is due for chronic routine followup and  Will reschedule physical. Medications reviewed Depression she is currently on Wellbutrin however has not seen much change in her medication she was actually started on this in February however was confused about the followup. She still not able to sleep very much and is under a lot of stress with her job. She has not been using her CPAP machine as instructed for her sleep apnea which is also contributing to her difficulty sleeping and fatigue during the day. Thyroid swelling she is status post ultrasound and was seen by ear nose and throat Adrenal tumor was noted on previous CT scan in 2007 she's not had followup for this recently. Obesity-she continues to gain weight she is not exercising as previous and has not been watching her diet. She's here with her sister today who reiterates that she has not been taking care of herself but worried about others   Review of Systems   GEN- + fatigue, fever, weight loss,weakness, recent illness HEENT- denies eye drainage, change in vision, nasal discharge, CVS- denies chest pain, palpitations RESP- denies SOB, cough, wheeze ABD- denies N/V, change in stools, abd pain GU- denies dysuria, hematuria, dribbling, incontinence MSK- denies joint pain, muscle aches, injury Neuro- denies headache, dizziness, syncope, seizure activity      Objective:   Physical Exam GEN- NAD, alert and oriented x3, obese HEENT- PERRL, EOMI, non injected sclera, pink conjunctiva, MMM, oropharynx clear Neck- Supple,  CVS- RRR, no murmur RESP-CTAB ABD-NABS,soft,NT,ND EXT- +pedal edema Pulses- Radial, DP- 2+ Psych-not overly depressed, not anxious appearing, no hallucinations, no SI        Assessment & Plan:

## 2012-01-06 NOTE — Telephone Encounter (Signed)
appt scheduled for today

## 2012-01-06 NOTE — Patient Instructions (Addendum)
Get the labs done Walk around building twice a day  Call for the CPAP set-up Goal in 2 pounds weight loss by next visit  D/c Wellbutrin  Start Effexor once a day  CT scan to be set abdomen  F/U 2 weeks

## 2012-01-09 ENCOUNTER — Encounter: Payer: Self-pay | Admitting: Family Medicine

## 2012-01-09 NOTE — Assessment & Plan Note (Addendum)
BP elevated today , goal < 130/80, check basic labs, plan to recheck in 2 weeks before any other meds changes

## 2012-01-09 NOTE — Assessment & Plan Note (Signed)
Obtain CT abdomen pelvis

## 2012-01-09 NOTE — Assessment & Plan Note (Signed)
Re-start CPAP 

## 2012-01-09 NOTE — Assessment & Plan Note (Signed)
Increase activity, change in diet Short term goal set

## 2012-01-09 NOTE — Assessment & Plan Note (Addendum)
Check A1C, no meds currently  Needs repeat urine Micro

## 2012-01-09 NOTE — Assessment & Plan Note (Signed)
Deteriorated, s/c Wellbutrin, start effexor

## 2012-01-20 ENCOUNTER — Encounter: Payer: Managed Care, Other (non HMO) | Admitting: Family Medicine

## 2012-01-21 LAB — BASIC METABOLIC PANEL
CO2: 35 mEq/L — ABNORMAL HIGH (ref 19–32)
Glucose, Bld: 99 mg/dL (ref 70–99)
Potassium: 3.5 mEq/L (ref 3.5–5.3)
Sodium: 141 mEq/L (ref 135–145)

## 2012-02-03 ENCOUNTER — Ambulatory Visit (INDEPENDENT_AMBULATORY_CARE_PROVIDER_SITE_OTHER): Payer: Managed Care, Other (non HMO) | Admitting: Family Medicine

## 2012-02-03 ENCOUNTER — Other Ambulatory Visit (HOSPITAL_COMMUNITY)
Admission: RE | Admit: 2012-02-03 | Discharge: 2012-02-03 | Disposition: A | Discharge status: Home / Self Care | Payer: Managed Care, Other (non HMO) | Source: Ambulatory Visit | Attending: Family Medicine | Admitting: Family Medicine

## 2012-02-03 ENCOUNTER — Encounter: Payer: Self-pay | Admitting: Family Medicine

## 2012-02-03 VITALS — BP 140/88 | HR 85 | Resp 15 | Ht 63.0 in | Wt 287.8 lb

## 2012-02-03 DIAGNOSIS — Z01419 Encounter for gynecological examination (general) (routine) without abnormal findings: Secondary | ICD-10-CM | POA: Insufficient documentation

## 2012-02-03 DIAGNOSIS — F329 Major depressive disorder, single episode, unspecified: Secondary | ICD-10-CM

## 2012-02-03 DIAGNOSIS — I10 Essential (primary) hypertension: Secondary | ICD-10-CM

## 2012-02-03 DIAGNOSIS — E1149 Type 2 diabetes mellitus with other diabetic neurological complication: Secondary | ICD-10-CM

## 2012-02-03 DIAGNOSIS — F3289 Other specified depressive episodes: Secondary | ICD-10-CM

## 2012-02-03 DIAGNOSIS — F32A Depression, unspecified: Secondary | ICD-10-CM

## 2012-02-03 DIAGNOSIS — E1142 Type 2 diabetes mellitus with diabetic polyneuropathy: Secondary | ICD-10-CM

## 2012-02-03 NOTE — Patient Instructions (Addendum)
Get flu shot at work Work on exercise and diet Continue the effexor  We will send a letter with PAP Smear results Take Calcium (1200mg ) and Vit D (800iu) Stop chlorthalidone  Take Lasix daily  F/U 2 months

## 2012-02-05 ENCOUNTER — Encounter: Payer: Self-pay | Admitting: Family Medicine

## 2012-02-05 NOTE — Assessment & Plan Note (Signed)
A1C deteriorated some to 6.6.% we discussed diet again, will hold on meds at this time

## 2012-02-05 NOTE — Assessment & Plan Note (Signed)
Uncontrolled, will have her d/c chlorthatilidone and take lasix daily instead of prn to help with leg swelling as well

## 2012-02-05 NOTE — Progress Notes (Signed)
  Subjective:    [name], female    DOB: 08/05/1955, 56 y.o.   MRN: [id number]  HPI  Pt here for GYN exam. She denies any vaginal bleeding, s/p hyserectomy Depression- has missed a few days of effexor otherwise doing okay Obesity- has not started walking at work, she is working longer hours Labs reviewed  Review of Systems GEN- denies fatigue, fever, weight loss,weakness, recent illness HEENT- denies eye drainage, change in vision, nasal discharge, CVS- denies chest pain, palpitations RESP- denies SOB, cough, wheeze ABD- denies N/V, change in stools, abd pain GU- denies dysuria, hematuria, dribbling, incontinence MSK- denies joint pain, muscle aches, injury Neuro- denies headache, dizziness, syncope, seizure activity      Objective:   Physical Exam GEN- NAD, alert and oriented, Neck- supple, no thyromegaly Breast- normal symmetry, no nipple inversion,no nipple drainage, no nodules or lumps felt Nodes- no axillary nodes GU- normal external genitalia, vaginal mucosa pink and moist, no uterus seen, no cervix, no adenxal mass, normal bladder, normal urethra Rectum-, normal rectal tone, small external tag, FOBT neg Ext- pedal edema Psych-normal affect and Mood       Assessment & Plan:   GYN exam- PAP smear done, if negative non further, Mammogram UTD  Flu shot to be done at work

## 2012-02-05 NOTE — Assessment & Plan Note (Addendum)
COntinue effexor, mood improved, needs PHQ-9 next visit

## 2012-03-01 ENCOUNTER — Other Ambulatory Visit: Payer: Self-pay | Admitting: Family Medicine

## 2012-04-05 ENCOUNTER — Encounter: Payer: Self-pay | Admitting: Family Medicine

## 2012-04-05 ENCOUNTER — Ambulatory Visit (INDEPENDENT_AMBULATORY_CARE_PROVIDER_SITE_OTHER): Payer: Managed Care, Other (non HMO) | Admitting: Family Medicine

## 2012-04-05 ENCOUNTER — Ambulatory Visit: Payer: Managed Care, Other (non HMO) | Admitting: Family Medicine

## 2012-04-05 VITALS — BP 140/90 | HR 72 | Resp 15 | Ht 63.0 in | Wt 288.4 lb

## 2012-04-05 DIAGNOSIS — F32A Depression, unspecified: Secondary | ICD-10-CM

## 2012-04-05 DIAGNOSIS — F3289 Other specified depressive episodes: Secondary | ICD-10-CM

## 2012-04-05 DIAGNOSIS — R609 Edema, unspecified: Secondary | ICD-10-CM

## 2012-04-05 DIAGNOSIS — F329 Major depressive disorder, single episode, unspecified: Secondary | ICD-10-CM

## 2012-04-05 DIAGNOSIS — E669 Obesity, unspecified: Secondary | ICD-10-CM

## 2012-04-05 DIAGNOSIS — R600 Localized edema: Secondary | ICD-10-CM

## 2012-04-05 DIAGNOSIS — I10 Essential (primary) hypertension: Secondary | ICD-10-CM

## 2012-04-05 MED ORDER — VENLAFAXINE HCL ER 37.5 MG PO CP24: 37.5000 mg | ORAL_CAPSULE | Freq: Every day | ORAL | Status: DC

## 2012-04-05 NOTE — Assessment & Plan Note (Signed)
Depression is weighing heavy on her obesity and lack of motivation, she will think about returning to her previous nutritionist/chiropracter? She prefers to try to tackle her exercise alone Discussed importance of weight loss

## 2012-04-05 NOTE — Assessment & Plan Note (Signed)
BP looks okay, advised to use lasix

## 2012-04-05 NOTE — Assessment & Plan Note (Addendum)
Detiorated, restart effexor, start counseling, she was doing well on effexor

## 2012-04-05 NOTE — Progress Notes (Signed)
  Subjective:    [name], female    DOB: 1956-03-29, 56 y.o.   MRN: [id number]  HPI Patient here to follow chronic medical problems. She's been on a lot of stress and her mood has been up and down. She stopped her Effexor one month ago. She thinks she should restart it should also see a therapist. A lot of her stress around her weight. She's not taken a step to start a weight loss or exercising. Her husband is now starting to comment about her weight. She previously was with a nutrition/chiropractor in [place] and she lost a significant amount of weight loss following his plan and is thinking about going back to this. Leg swelling/high blood pressure she's not been taking her Lasix on a regular basis because of having to use the restroom a lot and occasional leaking if she holds it too long. Her legs do improve if she  takes the medications.  Review of Systems  GEN- denies fatigue, fever, weight loss,weakness, recent illness HEENT- denies eye drainage, change in vision, nasal discharge, CVS- denies chest pain, palpitations RESP- denies SOB, cough, wheeze ABD- denies N/V, change in stools, abd pain GU- denies dysuria, hematuria, dribbling, incontinence MSK- denies joint pain, muscle aches, injury Neuro- denies headache, dizziness, syncope, seizure activity      Objective:   Physical Exam GEN- NAD, alert and oriented x3, obese HEENT- PERRL, EOMI, non injected sclera, pink conjunctiva, MMM, oropharynx clear Neck- Supple,  CVS- RRR, no murmur RESP-CTAB EXT- +pitting edema Pulses- Radial, DP- 2+ Psych- normal affect and Mood, normal speech        Assessment & Plan:

## 2012-04-05 NOTE — Patient Instructions (Addendum)
I recommend Calcium (1000mg ) and Vit D (800IU) Restart the effexor and call the therapist  Take lasix daily  F/U 8 weeks

## 2012-04-05 NOTE — Assessment & Plan Note (Signed)
Lasix to be used on regular basis

## 2012-06-04 ENCOUNTER — Ambulatory Visit: Payer: Managed Care, Other (non HMO) | Admitting: Family Medicine

## 2012-06-21 ENCOUNTER — Ambulatory Visit: Payer: Managed Care, Other (non HMO) | Admitting: Family Medicine

## 2012-07-21 ENCOUNTER — Other Ambulatory Visit: Payer: Self-pay | Admitting: Family Medicine

## 2012-08-21 ENCOUNTER — Other Ambulatory Visit: Payer: Self-pay | Admitting: Family Medicine

## 2012-08-21 ENCOUNTER — Other Ambulatory Visit: Payer: Self-pay

## 2012-08-21 ENCOUNTER — Telehealth: Payer: Self-pay | Admitting: Family Medicine

## 2012-08-21 MED ORDER — LORAZEPAM 0.5 MG PO TABS: ORAL_TABLET | ORAL | Status: DC

## 2012-08-21 NOTE — Telephone Encounter (Signed)
FYI- Dr Jeanice Lim has spoken with [facility]. She went into another room to speak with Dr privately

## 2012-08-21 NOTE — Progress Notes (Signed)
Called and spoke with patient. Her son died on 20-Oct-2022 diagnosed with assistant brain swelling she had last spoke with him on Thursday. He's been having headaches. She is surrounded with family and currently grieving. She's not sleeping very well and feels anxious. She also notes that she has been urinating more but denies any abdominal pain or dysuria. She's not taking the Effexor per her last visit. I have provided her with lorazepam to be used as needed for anxiety and stress. She will followup with me when able to

## 2012-09-14 ENCOUNTER — Encounter: Payer: Self-pay | Admitting: Family Medicine

## 2012-09-14 ENCOUNTER — Ambulatory Visit (INDEPENDENT_AMBULATORY_CARE_PROVIDER_SITE_OTHER): Payer: Managed Care, Other (non HMO) | Admitting: Family Medicine

## 2012-09-14 VITALS — BP 154/92 | HR 88 | Resp 16 | Wt 275.0 lb

## 2012-09-14 DIAGNOSIS — F32A Depression, unspecified: Secondary | ICD-10-CM

## 2012-09-14 DIAGNOSIS — Z23 Encounter for immunization: Secondary | ICD-10-CM

## 2012-09-14 DIAGNOSIS — F3289 Other specified depressive episodes: Secondary | ICD-10-CM

## 2012-09-14 DIAGNOSIS — E039 Hypothyroidism, unspecified: Secondary | ICD-10-CM

## 2012-09-14 DIAGNOSIS — E1149 Type 2 diabetes mellitus with other diabetic neurological complication: Secondary | ICD-10-CM

## 2012-09-14 DIAGNOSIS — I10 Essential (primary) hypertension: Secondary | ICD-10-CM

## 2012-09-14 DIAGNOSIS — R32 Unspecified urinary incontinence: Secondary | ICD-10-CM

## 2012-09-14 DIAGNOSIS — N898 Other specified noninflammatory disorders of vagina: Secondary | ICD-10-CM

## 2012-09-14 DIAGNOSIS — F329 Major depressive disorder, single episode, unspecified: Secondary | ICD-10-CM

## 2012-09-14 DIAGNOSIS — E785 Hyperlipidemia, unspecified: Secondary | ICD-10-CM

## 2012-09-14 LAB — CBC
HCT: 38.8 % (ref 36.0–46.0)
MCV: 78.5 fL (ref 78.0–100.0)
Platelets: 253 10*3/uL (ref 150–400)
RBC: 4.94 MIL/uL (ref 3.87–5.11)
WBC: 6.2 10*3/uL (ref 4.0–10.5)

## 2012-09-14 LAB — COMPREHENSIVE METABOLIC PANEL
AST: 23 U/L (ref 0–37)
Albumin: 3.8 g/dL (ref 3.5–5.2)
Alkaline Phosphatase: 69 U/L (ref 39–117)
Potassium: 3.5 mEq/L (ref 3.5–5.3)
Sodium: 142 mEq/L (ref 135–145)
Total Protein: 7 g/dL (ref 6.0–8.3)

## 2012-09-14 LAB — LIPID PANEL
Total CHOL/HDL Ratio: 3.5 Ratio
VLDL: 14 mg/dL (ref 0–40)

## 2012-09-14 LAB — T4: T4, Total: 4.6 ug/dL — ABNORMAL LOW (ref 5.0–12.5)

## 2012-09-14 NOTE — Progress Notes (Signed)
  Subjective:    [name], female    DOB: Apr 03, 1956, 57 y.o.   MRN: [id number]  HPI   Patient she was here for physical exam however she had a Pap smear done in September 2013 which was negative. Status returned to her regular office visit. Her youngest son passed away from unknown reasons a few weeks ago and she is grieving. She's had difficulty taking her medications states its too much for her to think about- this includes thyroid medication. She and her husband are seeking counseling today. She has a swelling on the inner labia and also had some difficulties with her bladder,states she urinates backwards, denies any burning sensation  Review of Systems   GEN- denies fatigue, fever, weight loss,weakness, recent illness HEENT- denies eye drainage, change in vision, nasal discharge, CVS- denies chest pain, palpitations RESP- denies SOB, cough, wheeze ABD- denies N/V, change in stools, abd pain GU- denies dysuria, hematuria, dribbling, incontinence MSK- denies joint pain, muscle aches, injury Neuro- denies headache, dizziness, syncope, seizure activity      Objective:   Physical Exam  GEN- NAD, alert and oriented x3 HEENT- PERRL, EOMI, non injected sclera, pink conjunctiva, MMM, oropharynx clear Neck- Supple,  CVS- RRR, no murmur RESP-CTAB GU- External genitialia normal, Inner right labia majora nickle size cystic lesion,mild TTP located 10' oclock potision , mild bladder prolapase, urethra in place EXT- No edema Pulses- Radial, DP- 2+ Psych- Upset and depressed appearing, not anxious, good eye contact, well dressed, no SI  EKG- NSR, inverted T waves      Assessment & Plan:

## 2012-09-14 NOTE — Patient Instructions (Addendum)
Goal is to  Exercise 30 minutes 5 days a week We will send a letter with lab results  Take your levothyroxine first thing in morning, say after brushing teeth Take your Cozaar and Coreg  Take the effexor once a day in morning Referral to GYN- Friday Schedule your mammogram and Bone Density  Tetanus given F/U 4 weeks for depression

## 2012-09-16 ENCOUNTER — Encounter: Payer: Self-pay | Admitting: Family Medicine

## 2012-09-16 DIAGNOSIS — R32 Unspecified urinary incontinence: Secondary | ICD-10-CM | POA: Insufficient documentation

## 2012-09-16 DIAGNOSIS — N898 Other specified noninflammatory disorders of vagina: Secondary | ICD-10-CM | POA: Insufficient documentation

## 2012-09-16 NOTE — Assessment & Plan Note (Signed)
Deterioated as expected with sudden death of her son. She will restart effexor daily as she was taking sporactically, family therapy has been set up. She did not use the benzo I sent at time of funeral but still has handy, d/c wellbutrin as she was not taking No SI

## 2012-09-16 NOTE — Assessment & Plan Note (Signed)
Needs statin therapy , LDL above goal however it midst of above will not add any new meds until she can consistently take her vital meds of BP and thyroid

## 2012-09-16 NOTE — Assessment & Plan Note (Signed)
Reiterated consistently taking synthroid and importance, labs abnormal Recheck in  6 weeks on consistent medications

## 2012-09-16 NOTE — Assessment & Plan Note (Signed)
Referral to GYN for removal. 

## 2012-09-16 NOTE — Assessment & Plan Note (Signed)
Some incontinence with sneezing, coughing more stress incontinence symptoms, mild bladder prolapse noted Will have gyn review as well may need urology

## 2012-09-16 NOTE — Assessment & Plan Note (Signed)
Diet controlled, repeat A1C looks good

## 2012-09-16 NOTE — Assessment & Plan Note (Signed)
Discussed medications and best times to take them, no change as not currently taking BP meds Metabolic panel today Urine micro

## 2012-09-26 ENCOUNTER — Encounter: Payer: Self-pay | Admitting: Family Medicine

## 2012-09-27 ENCOUNTER — Encounter: Payer: Self-pay | Admitting: Obstetrics & Gynecology

## 2012-09-27 ENCOUNTER — Ambulatory Visit (INDEPENDENT_AMBULATORY_CARE_PROVIDER_SITE_OTHER): Payer: Managed Care, Other (non HMO) | Admitting: Obstetrics & Gynecology

## 2012-09-27 VITALS — BP 170/110 | Ht 63.0 in | Wt 274.0 lb

## 2012-09-27 DIAGNOSIS — N907 Vulvar cyst: Secondary | ICD-10-CM

## 2012-09-27 DIAGNOSIS — N9089 Other specified noninflammatory disorders of vulva and perineum: Secondary | ICD-10-CM

## 2012-09-27 DIAGNOSIS — N3941 Urge incontinence: Secondary | ICD-10-CM

## 2012-09-27 MED ORDER — MIRABEGRON ER 25 MG PO TB24: 25.0000 mg | ORAL_TABLET | Freq: Every day | ORAL | Status: DC

## 2012-09-27 NOTE — Patient Instructions (Addendum)
Urinary Incontinence Your doctor wants you to have this information about urinary incontinence. This is the inability to keep urine in your body until you decide to release it. CAUSES  Prostate gland enlargement is a common cause of urinary incontinence. But there are many different causes for losing urinary control. They include:  Medicines.  Infections.  Prostate problems.  Surgery.  Neurological diseases.  Emotional factors. DIAGNOSIS  Evaluating the cause of incontinence is important in choosing the best treatment. This may require:  An ultrasound exam.  Kidney and bladder X-rays.  Cystoscopy. This is an exam of the bladder using a narrow scope. TREATMENT  For incontinent patients, normal daily hygiene and using changing pads or adult diapers regularly will prevent offensive odors and skin damage from the moisture. Changing your medicines may help control incontinence. Your caregiver may prescribe some medicines to help you regain control. Avoid caffeine. It can over-stimulate the bladder. Use the bathroom regularly. Try about every 2 to 3 hours even if you do not feel the need. Take time to empty your bladder completely. After urinating, wait a minute. Then try to urinate again. External devices used to catch urine or an indwelling urine catheter (Foley catheter) may be needed as well. Some prostate gland problems require surgery to correct. Call your caregiver for more information. Document Released: 06/16/2004 Document Revised: 08/01/2011 Document Reviewed: 06/11/2008 [employer] Patient Information 2013 [employer]  

## 2012-09-27 NOTE — Progress Notes (Signed)
[name], female   DOB: 07-05-1955, 58 y.o.   MRN: [id number] Referred from Dr Lodema Hong.  #1 "Bladder dropped" frequent urination, 4 times per night, urgency, loses urine 1 time per day, small amount  Exam Minimal bladder relaxation, really minimal movement with straining negative q tip test Status post hysterectomy  Detrusor instability, urge incontinence Rx Myrbetriq 25 qhs increase if needed in 1 month  #2 Vulvar cyst about 6-8 months  Exam Right vulvar sebaceous cyst 1.5 cm  Will remove on follow up visit

## 2012-10-03 ENCOUNTER — Other Ambulatory Visit: Payer: Self-pay | Admitting: Family Medicine

## 2012-10-03 DIAGNOSIS — Z139 Encounter for screening, unspecified: Secondary | ICD-10-CM

## 2012-10-05 ENCOUNTER — Ambulatory Visit (HOSPITAL_COMMUNITY)
Admission: RE | Admit: 2012-10-05 | Discharge: 2012-10-05 | Disposition: A | Discharge status: Home / Self Care | Payer: Managed Care, Other (non HMO) | Source: Ambulatory Visit | Attending: Family Medicine | Admitting: Family Medicine

## 2012-10-05 DIAGNOSIS — Z139 Encounter for screening, unspecified: Secondary | ICD-10-CM

## 2012-10-05 DIAGNOSIS — Z1231 Encounter for screening mammogram for malignant neoplasm of breast: Secondary | ICD-10-CM | POA: Insufficient documentation

## 2012-10-12 ENCOUNTER — Ambulatory Visit (INDEPENDENT_AMBULATORY_CARE_PROVIDER_SITE_OTHER): Payer: Managed Care, Other (non HMO) | Admitting: Family Medicine

## 2012-10-12 ENCOUNTER — Encounter: Payer: Self-pay | Admitting: Family Medicine

## 2012-10-12 ENCOUNTER — Other Ambulatory Visit: Payer: Self-pay | Admitting: Family Medicine

## 2012-10-12 VITALS — BP 158/100 | HR 80 | Resp 18 | Ht 63.0 in | Wt 269.0 lb

## 2012-10-12 DIAGNOSIS — R32 Unspecified urinary incontinence: Secondary | ICD-10-CM

## 2012-10-12 DIAGNOSIS — F32A Depression, unspecified: Secondary | ICD-10-CM

## 2012-10-12 DIAGNOSIS — F329 Major depressive disorder, single episode, unspecified: Secondary | ICD-10-CM

## 2012-10-12 DIAGNOSIS — F3289 Other specified depressive episodes: Secondary | ICD-10-CM

## 2012-10-12 DIAGNOSIS — E039 Hypothyroidism, unspecified: Secondary | ICD-10-CM

## 2012-10-12 DIAGNOSIS — I10 Essential (primary) hypertension: Secondary | ICD-10-CM

## 2012-10-12 DIAGNOSIS — Z780 Asymptomatic menopausal state: Secondary | ICD-10-CM

## 2012-10-12 MED ORDER — TOLTERODINE TARTRATE ER 4 MG PO CP24: 4.0000 mg | ORAL_CAPSULE | Freq: Every day | ORAL | Status: DC

## 2012-10-12 NOTE — Progress Notes (Signed)
  Subjective:    [name], female    DOB: 23-Feb-1956, 57 y.o.   MRN: [id number]  HPI  Patient here to followup an interim her blood pressure as well as her depression. She did not start the Effexor is to not want to take any medications and she is scheduled to have her first therapy session with her husband on Thursday. She continues to think of all of the different possibilities she could intervene early in her sons live in may prevent his sudden death from what appears to be a brain cyst that cause some swelling. She is counseled many people on death of family members and how to cope and she thinks that this is now coming back to her on her. Her husband is also grieving as he feels he no longer has a Chief Technology Officer and was unable to protect his family and she does not know how to help him through this situation. She is taking her Synthroid first thing in the morning. She's only taking 2 of her blood pressure pills which was losartan and chlorthalidone she also states she takes the Lasix almost every evening now  She was evaluated by GYN for the vulvar cyst they're planning to remove this in 4 weeks she was also given a new medication for overactive bladder however this is too expensive for her and she is asking for an alternative  Review of Systems - per above   GEN- denies fatigue, fever, weight loss,weakness, recent illness CVS- denies chest pain, palpitations RESP- denies SOB, cough, wheeze Neuro- denies headache, dizziness, syncope, seizure activity       Objective:   Physical Exam  GEN-NAD,alert and oriented x 3  Psych- depressed affect, not anxious, good eye contact, well dressed, no SI     Assessment & Plan:

## 2012-10-12 NOTE — Assessment & Plan Note (Signed)
Trial of Detrol. 

## 2012-10-12 NOTE — Assessment & Plan Note (Signed)
Uncontrolled and worsened by recent death of son I think therapy will be of great help to her She declines medications at this time  approx 30 minutes spent with pt > 50% on counseling for depression

## 2012-10-12 NOTE — Assessment & Plan Note (Signed)
She is not taking medications as prescribed, she does however use the lasix on a regular basis She tells meBP has never been controlled > 20 years Goal 140/90 D/C Chlorthalidone as she is taking lasix on regular basis Meds- Lasix, coreg, losartan

## 2012-10-12 NOTE — Patient Instructions (Signed)
Try the detrol Stop chlorthalidone and take the lasix once a day  Continue coreg and losartan F/U 2 months [employer]

## 2012-10-12 NOTE — Assessment & Plan Note (Signed)
We will recheck TFT next visit

## 2012-10-17 ENCOUNTER — Other Ambulatory Visit: Payer: Self-pay | Admitting: Family Medicine

## 2012-10-29 ENCOUNTER — Ambulatory Visit: Payer: Managed Care, Other (non HMO) | Admitting: Obstetrics & Gynecology

## 2012-11-22 ENCOUNTER — Telehealth: Payer: Self-pay | Admitting: Family Medicine

## 2012-11-22 MED ORDER — LORAZEPAM 0.5 MG PO TABS: ORAL_TABLET | ORAL | Status: AC

## 2012-11-22 MED ORDER — FUROSEMIDE 20 MG PO TABS: ORAL_TABLET | ORAL | Status: DC

## 2012-11-22 MED ORDER — CARVEDILOL 12.5 MG PO TABS: ORAL_TABLET | ORAL | Status: DC

## 2012-11-22 MED ORDER — LEVOTHYROXINE SODIUM 200 MCG PO TABS: ORAL_TABLET | ORAL | Status: DC

## 2012-11-22 MED ORDER — LOSARTAN POTASSIUM 100 MG PO TABS: ORAL_TABLET | ORAL | Status: DC

## 2012-11-22 MED ORDER — ALLOPURINOL 100 MG PO TABS: ORAL_TABLET | ORAL | Status: DC

## 2012-11-22 MED ORDER — TOLTERODINE TARTRATE ER 4 MG PO CP24: 4.0000 mg | ORAL_CAPSULE | Freq: Every day | ORAL | Status: DC

## 2012-11-22 MED ORDER — ASPIRIN 81 MG PO TABS: 81.0000 mg | ORAL_TABLET | Freq: Every day | ORAL | Status: DC

## 2012-11-22 NOTE — Telephone Encounter (Signed)
All meds refilled.

## 2013-02-15 ENCOUNTER — Ambulatory Visit (INDEPENDENT_AMBULATORY_CARE_PROVIDER_SITE_OTHER): Payer: BC Managed Care – PPO | Admitting: Family Medicine

## 2013-02-15 VITALS — BP 148/102 | HR 80 | Temp 98.3°F | Resp 18 | Wt 256.0 lb

## 2013-02-15 DIAGNOSIS — F3289 Other specified depressive episodes: Secondary | ICD-10-CM

## 2013-02-15 DIAGNOSIS — E1149 Type 2 diabetes mellitus with other diabetic neurological complication: Secondary | ICD-10-CM

## 2013-02-15 DIAGNOSIS — I10 Essential (primary) hypertension: Secondary | ICD-10-CM

## 2013-02-15 DIAGNOSIS — F32A Depression, unspecified: Secondary | ICD-10-CM

## 2013-02-15 DIAGNOSIS — F329 Major depressive disorder, single episode, unspecified: Secondary | ICD-10-CM

## 2013-02-15 DIAGNOSIS — E039 Hypothyroidism, unspecified: Secondary | ICD-10-CM

## 2013-02-15 LAB — BASIC METABOLIC PANEL
Calcium: 9.3 mg/dL (ref 8.4–10.5)
Chloride: 101 mEq/L (ref 96–112)
Creat: 0.67 mg/dL (ref 0.50–1.10)
Sodium: 143 mEq/L (ref 135–145)

## 2013-02-15 LAB — HEMOGLOBIN A1C
Hgb A1c MFr Bld: 6.6 % — ABNORMAL HIGH (ref <5.7)
Mean Plasma Glucose: 143 mg/dL — ABNORMAL HIGH (ref <117)

## 2013-02-15 LAB — CBC
HCT: 39.8 % (ref 36.0–46.0)
Hemoglobin: 13.1 g/dL (ref 12.0–15.0)
RBC: 5.19 MIL/uL — ABNORMAL HIGH (ref 3.87–5.11)

## 2013-02-15 LAB — TSH: TSH: 0.977 u[IU]/mL (ref 0.350–4.500)

## 2013-02-15 LAB — T4, FREE: Free T4: 1.4 ng/dL (ref 0.80–1.80)

## 2013-02-15 MED ORDER — CARVEDILOL 25 MG PO TABS: ORAL_TABLET | ORAL | Status: DC

## 2013-02-15 NOTE — Patient Instructions (Addendum)
Continue current medications We will all with lab results Coreg increased to 25mg  twice a day  FLu shot at work F/U 6 weeks

## 2013-02-17 ENCOUNTER — Encounter: Payer: Self-pay | Admitting: Family Medicine

## 2013-02-17 NOTE — Assessment & Plan Note (Signed)
Continues to grieve but has support Declines further medications Will assist as needed

## 2013-02-17 NOTE — Assessment & Plan Note (Signed)
Uncontrolled, increaes Coreg Improve use of lasix

## 2013-02-17 NOTE — Progress Notes (Signed)
  Subjective:    [name], female    DOB: August 12, 1955, 57 y.o.   MRN: [id number]  HPI  Pt here to f/u chronic medical problems. She is still grieving her son who died suddenly. SHe is no longer in therapy with her husband. Has not used ativan. She is looking in to personal therapy through her job. HTN- taking BP meds daily, does not have diuretic here today, takes a few days a week when swelling is severe, does not like to take daily because she has to drive often for work and does not want to stop for restroom breaks Due for labs. Medications reviewed Takes detrol as needed   Review of Systems  GEN- denies fatigue, fever, weight loss,weakness, recent illness HEENT- denies eye drainage, change in vision, nasal discharge, CVS- denies chest pain, palpitations RESP- denies SOB, cough, wheeze ABD- denies N/V, change in stools, abd pain GU- denies dysuria, hematuria, dribbling, incontinence MSK- denies joint pain, muscle aches, injury Neuro- denies headache, dizziness, syncope, seizure activity      Objective:   Physical Exam  GEN- NAD, alert and oriented x3, obese HEENT- PERRL, EOMI, non injected sclera, pink conjunctiva, MMM, oropharynx clear Neck- Supple,  CVS- RRR, no murmur RESP-CTAB EXT- No edema Pulses- Radial, DP- 2+ Psych- crying discussing son, not depressed appearing, not anxious, good eye contact, well dressed, no SI        Assessment & Plan:

## 2013-02-17 NOTE — Assessment & Plan Note (Signed)
Check thyroid levels taking daily now

## 2013-02-17 NOTE — Assessment & Plan Note (Signed)
Diet controlled, discussed weight, little exercise Check A1C

## 2013-02-25 ENCOUNTER — Other Ambulatory Visit: Payer: Self-pay | Admitting: Family Medicine

## 2013-02-25 MED ORDER — LOSARTAN POTASSIUM 100 MG PO TABS: ORAL_TABLET | ORAL | Status: DC

## 2013-02-25 MED ORDER — TOLTERODINE TARTRATE ER 4 MG PO CP24: 4.0000 mg | ORAL_CAPSULE | Freq: Every day | ORAL | Status: DC

## 2013-02-25 MED ORDER — LEVOTHYROXINE SODIUM 200 MCG PO TABS: ORAL_TABLET | ORAL | Status: DC

## 2013-02-25 NOTE — Telephone Encounter (Signed)
Meds refilled per pt request

## 2013-03-29 ENCOUNTER — Ambulatory Visit: Payer: BC Managed Care – PPO | Admitting: Family Medicine

## 2013-03-29 ENCOUNTER — Other Ambulatory Visit: Payer: Self-pay | Admitting: Family Medicine

## 2013-03-29 NOTE — Telephone Encounter (Signed)
Medication refilled per protocol. 

## 2013-05-07 ENCOUNTER — Encounter: Payer: Self-pay | Admitting: Family Medicine

## 2013-05-07 ENCOUNTER — Ambulatory Visit (INDEPENDENT_AMBULATORY_CARE_PROVIDER_SITE_OTHER): Payer: BC Managed Care – PPO | Admitting: Family Medicine

## 2013-05-07 VITALS — BP 130/80 | HR 68 | Temp 97.9°F | Resp 18 | Ht 63.0 in | Wt 271.0 lb

## 2013-05-07 DIAGNOSIS — F329 Major depressive disorder, single episode, unspecified: Secondary | ICD-10-CM

## 2013-05-07 DIAGNOSIS — E669 Obesity, unspecified: Secondary | ICD-10-CM

## 2013-05-07 DIAGNOSIS — F32A Depression, unspecified: Secondary | ICD-10-CM

## 2013-05-07 DIAGNOSIS — I10 Essential (primary) hypertension: Secondary | ICD-10-CM

## 2013-05-07 DIAGNOSIS — R600 Localized edema: Secondary | ICD-10-CM

## 2013-05-07 DIAGNOSIS — F3289 Other specified depressive episodes: Secondary | ICD-10-CM

## 2013-05-07 DIAGNOSIS — R609 Edema, unspecified: Secondary | ICD-10-CM

## 2013-05-07 MED ORDER — TRAZODONE HCL 50 MG PO TABS: ORAL_TABLET | ORAL | Status: DC

## 2013-05-07 NOTE — Assessment & Plan Note (Signed)
Weight gain noted during his grieving process.

## 2013-05-07 NOTE — Assessment & Plan Note (Signed)
Blood pressure is much improved today we will continue current medication.

## 2013-05-07 NOTE — Progress Notes (Signed)
   Subjective:    [name], female    DOB: [birth year], 57 y.o.   MRN: [id number]  HPI  Pt here to f/u HTN, last visit Coreg Increased to 25mg  twice a day. Grief- She continues to have difficulty since her son has passed. She's not sleeping well. She feels fatigued during the day and knows that she is depressed. She did do one-on-one counseling but did not get very much out of this. She's considering doing a group therapy for parents who have lost their children. Her husband is also grieving but in a different way and they're often butting heads. She continues to work and is productive. Diabetes mellitus-diet controlled. Her last A1c was less than 7% however she has gained some weight during the grieving process and has not been following a good diet. She's also noticed a few days on her medications.   Review of Systems  GEN- +fatigue, fever, weight loss,weakness, recent illness HEENT- denies eye drainage, change in vision, nasal discharge, CVS- denies chest pain, palpitations RESP- denies SOB, cough, wheeze ABD- denies N/V, change in stools, abd pain Neuro- denies headache, dizziness, syncope, seizure activity      Objective:   Physical Exam GEN- NAD, alert and oriented x3, obese HEENT- PERRL, EOMI, non injected sclera, pink conjunctiva, MMM, oropharynx clear  CVS- RRR, no murmur RESP-CTAB EXT- +pedal edema Pulses- Radial, DP- 2+ Psych-depressed affect, not anxious, no SI, good eye contact       Assessment & Plan:

## 2013-05-07 NOTE — Patient Instructions (Signed)
Continue current medications Try the trazodone 1-2 tablets as needed for sleep sleep  Flu shot at work  F/U 4 months- Labs will be done

## 2013-05-07 NOTE — Assessment & Plan Note (Signed)
Advised to use the Lasix on a regular basis for her leg swell

## 2013-05-07 NOTE — Assessment & Plan Note (Signed)
She agrees to start trazodone which will help with her depression as well as her sleep. She will also tried the group therapy for pacer to have lost children

## 2013-05-13 ENCOUNTER — Other Ambulatory Visit: Payer: Self-pay | Admitting: Family Medicine

## 2013-09-06 ENCOUNTER — Ambulatory Visit (INDEPENDENT_AMBULATORY_CARE_PROVIDER_SITE_OTHER): Payer: BC Managed Care – PPO | Admitting: Family Medicine

## 2013-09-06 ENCOUNTER — Encounter: Payer: Self-pay | Admitting: Family Medicine

## 2013-09-06 VITALS — BP 136/88 | HR 64 | Temp 97.9°F | Resp 16 | Ht 62.0 in | Wt 279.0 lb

## 2013-09-06 DIAGNOSIS — R609 Edema, unspecified: Secondary | ICD-10-CM

## 2013-09-06 DIAGNOSIS — E785 Hyperlipidemia, unspecified: Secondary | ICD-10-CM

## 2013-09-06 DIAGNOSIS — R600 Localized edema: Secondary | ICD-10-CM

## 2013-09-06 DIAGNOSIS — F329 Major depressive disorder, single episode, unspecified: Secondary | ICD-10-CM

## 2013-09-06 DIAGNOSIS — E039 Hypothyroidism, unspecified: Secondary | ICD-10-CM

## 2013-09-06 DIAGNOSIS — I10 Essential (primary) hypertension: Secondary | ICD-10-CM

## 2013-09-06 DIAGNOSIS — E1149 Type 2 diabetes mellitus with other diabetic neurological complication: Secondary | ICD-10-CM

## 2013-09-06 DIAGNOSIS — F3289 Other specified depressive episodes: Secondary | ICD-10-CM

## 2013-09-06 DIAGNOSIS — F32A Depression, unspecified: Secondary | ICD-10-CM

## 2013-09-06 LAB — T3, FREE: T3, Free: 2.6 pg/mL (ref 2.3–4.2)

## 2013-09-06 LAB — LIPID PANEL
Cholesterol: 170 mg/dL (ref 0–200)
HDL: 57 mg/dL (ref >39)
LDL CALC: 101 mg/dL — AB (ref 0–99)
Total CHOL/HDL Ratio: 3 Ratio
Triglycerides: 59 mg/dL (ref <150)
VLDL: 12 mg/dL (ref 0–40)

## 2013-09-06 LAB — COMPREHENSIVE METABOLIC PANEL
ALK PHOS: 81 U/L (ref 39–117)
ALT: 15 U/L (ref 0–35)
AST: 17 U/L (ref 0–37)
Albumin: 3.8 g/dL (ref 3.5–5.2)
BILIRUBIN TOTAL: 0.3 mg/dL (ref 0.2–1.2)
BUN: 17 mg/dL (ref 6–23)
CO2: 32 mEq/L (ref 19–32)
Calcium: 9 mg/dL (ref 8.4–10.5)
Chloride: 100 mEq/L (ref 96–112)
Creat: 0.8 mg/dL (ref 0.50–1.10)
Glucose, Bld: 113 mg/dL — ABNORMAL HIGH (ref 70–99)
Potassium: 3.9 mEq/L (ref 3.5–5.3)
Sodium: 143 mEq/L (ref 135–145)
Total Protein: 7.3 g/dL (ref 6.0–8.3)

## 2013-09-06 LAB — CBC WITH DIFFERENTIAL/PLATELET
BASOS PCT: 0 % (ref 0–1)
Basophils Absolute: 0 10*3/uL (ref 0.0–0.1)
EOS PCT: 2 % (ref 0–5)
Eosinophils Absolute: 0.1 10*3/uL (ref 0.0–0.7)
HEMATOCRIT: 38.3 % (ref 36.0–46.0)
Hemoglobin: 12.5 g/dL (ref 12.0–15.0)
Lymphocytes Relative: 36 % (ref 12–46)
Lymphs Abs: 2.7 10*3/uL (ref 0.7–4.0)
MCH: 25.6 pg — ABNORMAL LOW (ref 26.0–34.0)
MCHC: 32.6 g/dL (ref 30.0–36.0)
MCV: 78.3 fL (ref 78.0–100.0)
MONO ABS: 0.4 10*3/uL (ref 0.1–1.0)
Monocytes Relative: 5 % (ref 3–12)
Neutro Abs: 4.2 10*3/uL (ref 1.7–7.7)
Neutrophils Relative %: 57 % (ref 43–77)
PLATELETS: 265 10*3/uL (ref 150–400)
RBC: 4.89 MIL/uL (ref 3.87–5.11)
RDW: 15.8 % — ABNORMAL HIGH (ref 11.5–15.5)
WBC: 7.4 10*3/uL (ref 4.0–10.5)

## 2013-09-06 LAB — TSH: TSH: 4.193 u[IU]/mL (ref 0.350–4.500)

## 2013-09-06 LAB — T4, FREE: FREE T4: 1.41 ng/dL (ref 0.80–1.80)

## 2013-09-06 LAB — HEMOGLOBIN A1C
Hgb A1c MFr Bld: 6.3 % — ABNORMAL HIGH (ref <5.7)
Mean Plasma Glucose: 134 mg/dL — ABNORMAL HIGH (ref <117)

## 2013-09-06 NOTE — Progress Notes (Signed)
[name], female   DOB: 1956/04/05, 58 y.o.   MRN: [id number]   Subjective:    [name], female    DOB: October 30, 1955, 58 y.o.   MRN: [id number]  Patient presents for 4 month F/U  Patient here to follow chronic medical problems. She has no specific concerns today. Diabetes mellitus diet controlled she is due for repeat A1c the last was 6.6% in September. She's also due for lipid panel of note she did have some peanut butter crackers this morning. She's taking her medications as prescribed often forgets to take some of the blood pressure medications. She did inquire about multivitamins. He continues to have difficulty with her sleep and depression but overall feels that she is in a better place. She does not attend the support groups on a regular basis. She never started the trazodone but knows that she has a day or if needed. She's been able to occupy her time with her grandchildren here the past couple months which she enjoys.   Review Of Systems:  GEN- denies fatigue, fever, weight loss,weakness, recent illness HEENT- denies eye drainage, change in vision, nasal discharge, CVS- denies chest pain, palpitations RESP- denies SOB, cough, wheeze ABD- denies N/V, change in stools, abd pain GU- denies dysuria, hematuria, dribbling, incontinence MSK- denies joint pain, muscle aches, injury Neuro- denies headache, dizziness, syncope, seizure activity       Objective:    BP 136/88  Pulse 64  Temp(Src) 97.9 F (36.6 C) (Oral)  Resp 16  Ht 5\' 2"  (1.575 m)  Wt 279 lb (126.554 kg)  BMI 51.02 kg/m2 GEN- NAD, alert and oriented x3 HEENT- PERRL, EOMI, non injected sclera, pink conjunctiva, MMM, oropharynx clear Neck- Supple, no thyromegaly CVS- RRR, no murmur RESP-CTAB EXT- 1+ edema Pulses- Radial, DP- 2+        Assessment & Plan:      Problem List Items Addressed This Visit   HYPOTHYROIDISM   Relevant Orders      TSH      T4, free      T3,  free   HYPERTENSION   HYPERLIPIDEMIA   Relevant Orders      Lipid panel   DIABETES MELLITUS, TYPE II, CONTROLLED, W/NEURO COMPS - Primary   Relevant Orders      CBC with Differential      Comprehensive metabolic panel      Hemoglobin A1c      Lipid panel      Note: This dictation was prepared with Dragon dictation along with smaller phrase technology. Any transcriptional errors that result from this process are unintentional.

## 2013-09-06 NOTE — Assessment & Plan Note (Signed)
Check check lipid panel. She's not on a statin drug yet

## 2013-09-06 NOTE — Patient Instructions (Signed)
We will call with lab results or send letter if normal Continue current medications F/U 3 MONTHS for PHYSICAL

## 2013-09-06 NOTE — Assessment & Plan Note (Signed)
Diet controlled we'll check A1c today she is on aspirin and ARB

## 2013-09-06 NOTE — Assessment & Plan Note (Signed)
Advised to take Lasix today secondary to edema.

## 2013-09-06 NOTE — Assessment & Plan Note (Signed)
Blood pressure looks okay today we'll have her continue current medication she's not had her diuretic

## 2013-09-06 NOTE — Assessment & Plan Note (Signed)
Check thyroid function tests she is taking her levothyroxine on a regular basis.

## 2013-09-06 NOTE — Assessment & Plan Note (Signed)
Continue to provide support for her. She and the family have pulled together doing fairly well. She does have the trazodone if she decides to take it for sleep we also discussed the use of melatonin

## 2013-09-10 ENCOUNTER — Encounter: Payer: Self-pay | Admitting: *Deleted

## 2013-11-08 ENCOUNTER — Encounter: Payer: Self-pay | Admitting: Family Medicine

## 2013-11-08 ENCOUNTER — Ambulatory Visit (INDEPENDENT_AMBULATORY_CARE_PROVIDER_SITE_OTHER): Payer: BC Managed Care – PPO | Admitting: Family Medicine

## 2013-11-08 ENCOUNTER — Ambulatory Visit (HOSPITAL_COMMUNITY)
Admission: RE | Admit: 2013-11-08 | Discharge: 2013-11-08 | Disposition: A | Discharge status: Home / Self Care | Payer: BC Managed Care – PPO | Source: Ambulatory Visit | Attending: Family Medicine | Admitting: Family Medicine

## 2013-11-08 VITALS — BP 144/88 | HR 76 | Temp 98.3°F | Resp 18 | Ht 62.0 in | Wt 265.0 lb

## 2013-11-08 DIAGNOSIS — I10 Essential (primary) hypertension: Secondary | ICD-10-CM

## 2013-11-08 DIAGNOSIS — R609 Edema, unspecified: Secondary | ICD-10-CM

## 2013-11-08 DIAGNOSIS — R0602 Shortness of breath: Secondary | ICD-10-CM

## 2013-11-08 DIAGNOSIS — R600 Localized edema: Secondary | ICD-10-CM

## 2013-11-08 DIAGNOSIS — I517 Cardiomegaly: Secondary | ICD-10-CM | POA: Insufficient documentation

## 2013-11-08 DIAGNOSIS — R011 Cardiac murmur, unspecified: Secondary | ICD-10-CM

## 2013-11-08 MED ORDER — FUROSEMIDE 40 MG PO TABS: ORAL_TABLET | ORAL | Status: DC

## 2013-11-08 MED ORDER — POTASSIUM CHLORIDE CRYS ER 20 MEQ PO TBCR: 20.0000 meq | EXTENDED_RELEASE_TABLET | Freq: Every day | ORAL | Status: DC

## 2013-11-08 NOTE — Progress Notes (Signed)
[name], female   DOB: 01-05-1956, 57 y.o.   MRN: [id number]   Subjective:    [name], female    DOB: 1955-10-20, 58 y.o.   MRN: [id number]  Patient presents for Increased edema  Patient here with increased leg swelling over the past month. She's also had some swelling in  her abdomen even though she is working out she has not lost much abdominal girth. She's also noticed increased shortness of breath even with walking short distances. She's not had any chest discomfort but does feel that her clavicle on the right side is more prominent in that it cuts off her breath when she is talking. She has been taking her Lasix 20 mg but does not urinate very much with this.  Her thyroid function and A1c were normal 2 months ago.  Review Of Systems:  GEN- denies fatigue, fever, weight loss,weakness, recent illness HEENT- denies eye drainage, change in vision, nasal discharge, CVS- denies chest pain, palpitations RESP- +SOB, cough, wheeze ABD- denies N/V, change in stools, abd pain GU- denies dysuria, hematuria, dribbling, incontinence MSK- denies joint pain, muscle aches, injury Neuro- denies headache, dizziness, syncope, seizure activity       Objective:    BP 144/88  Pulse 76  Temp(Src) 98.3 F (36.8 C) (Oral)  Resp 18  Ht 5\' 2"  (1.575 m)  Wt 265 lb (120.203 kg)  BMI 48.46 kg/m2  SpO2 95% GEN- NAD, alert and oriented x3 HEENT- PERRL, EOMI, non injected sclera, pink conjunctiva, MMM, oropharynx clear Neck- Supple, no thyromegaly. No JVD  MSK- prominent right collar bone at sternoclavicular border  CVS- RRR, soft 2/6 SEM at Right sternal border RESP-CTAB, normal WOB ABD-NABS,soft,NT,ND EXT- 1+ edema to shins Pulses- Radial, DP-  decreased Bilat    EKG- NSR     Assessment & Plan:      Problem List Items Addressed This Visit   SOB (shortness of breath) - Primary   Leg edema   HYPERTENSION   Relevant Medications      furosemide  (LASIX) tablet      Note: This dictation was prepared with Dragon dictation along with smaller phrase technology. Any transcriptional errors that result from this process are unintentional.

## 2013-11-08 NOTE — Assessment & Plan Note (Signed)
Per above will rule out heart failure versus renal or liver failure. I doubt the latter. Echogram to be done chest x-ray

## 2013-11-08 NOTE — Assessment & Plan Note (Signed)
Will increase Lasix to 40 mg twice a day through the weekend. I will check her metabolic panel and her CBC. We will obtain a chest x-ray I do not hear any indication of infection on exam. I'll also get an echocardiogram

## 2013-11-08 NOTE — Patient Instructions (Signed)
Increase lasix to 40mg  twice a day with potassium for the next 3 days Get a chest x-ray An echocardiogram will be done of her heart to evaluate valves and look for evidence of heart failure Go to the ER if shortness of breath gets worse F/U pending results

## 2013-11-08 NOTE — Assessment & Plan Note (Signed)
Blood pressure looks okay today 

## 2013-11-09 LAB — COMPREHENSIVE METABOLIC PANEL
ALK PHOS: 80 U/L (ref 39–117)
ALT: 17 U/L (ref 0–35)
AST: 18 U/L (ref 0–37)
Albumin: 3.6 g/dL (ref 3.5–5.2)
BILIRUBIN TOTAL: 0.3 mg/dL (ref 0.2–1.2)
BUN: 14 mg/dL (ref 6–23)
CALCIUM: 8.8 mg/dL (ref 8.4–10.5)
CHLORIDE: 102 meq/L (ref 96–112)
CO2: 31 mEq/L (ref 19–32)
CREATININE: 0.77 mg/dL (ref 0.50–1.10)
Glucose, Bld: 102 mg/dL — ABNORMAL HIGH (ref 70–99)
Potassium: 3.7 mEq/L (ref 3.5–5.3)
Sodium: 144 mEq/L (ref 135–145)
Total Protein: 6.9 g/dL (ref 6.0–8.3)

## 2013-11-09 LAB — CBC WITH DIFFERENTIAL/PLATELET
BASOS PCT: 0 % (ref 0–1)
Basophils Absolute: 0 10*3/uL (ref 0.0–0.1)
EOS PCT: 3 % (ref 0–5)
Eosinophils Absolute: 0.2 10*3/uL (ref 0.0–0.7)
HEMATOCRIT: 36.8 % (ref 36.0–46.0)
HEMOGLOBIN: 11.9 g/dL — AB (ref 12.0–15.0)
Lymphocytes Relative: 37 % (ref 12–46)
Lymphs Abs: 2.9 10*3/uL (ref 0.7–4.0)
MCH: 25.9 pg — ABNORMAL LOW (ref 26.0–34.0)
MCHC: 32.3 g/dL (ref 30.0–36.0)
MCV: 80 fL (ref 78.0–100.0)
MONO ABS: 0.6 10*3/uL (ref 0.1–1.0)
Monocytes Relative: 7 % (ref 3–12)
NEUTROS ABS: 4.2 10*3/uL (ref 1.7–7.7)
Neutrophils Relative %: 53 % (ref 43–77)
Platelets: 230 10*3/uL (ref 150–400)
RBC: 4.6 MIL/uL (ref 3.87–5.11)
RDW: 14.9 % (ref 11.5–15.5)
WBC: 7.9 10*3/uL (ref 4.0–10.5)

## 2013-11-09 LAB — BRAIN NATRIURETIC PEPTIDE: BRAIN NATRIURETIC PEPTIDE: 46.7 pg/mL (ref 0.0–100.0)

## 2013-11-12 ENCOUNTER — Ambulatory Visit (HOSPITAL_COMMUNITY)
Admission: RE | Admit: 2013-11-12 | Discharge: 2013-11-12 | Disposition: A | Discharge status: Home / Self Care | Payer: BC Managed Care – PPO | Source: Ambulatory Visit | Attending: Family Medicine | Admitting: Family Medicine

## 2013-11-12 DIAGNOSIS — R0989 Other specified symptoms and signs involving the circulatory and respiratory systems: Secondary | ICD-10-CM | POA: Insufficient documentation

## 2013-11-12 DIAGNOSIS — R600 Localized edema: Secondary | ICD-10-CM

## 2013-11-12 DIAGNOSIS — R0602 Shortness of breath: Secondary | ICD-10-CM

## 2013-11-12 DIAGNOSIS — I10 Essential (primary) hypertension: Secondary | ICD-10-CM

## 2013-11-12 DIAGNOSIS — I059 Rheumatic mitral valve disease, unspecified: Secondary | ICD-10-CM

## 2013-11-12 DIAGNOSIS — R0609 Other forms of dyspnea: Secondary | ICD-10-CM | POA: Insufficient documentation

## 2013-11-12 NOTE — Progress Notes (Signed)
*  PRELIMINARY RESULTS* Echocardiogram 2D Echocardiogram has been performed.  Frances Hughes 11/12/2013, 2:34 PM

## 2013-11-15 ENCOUNTER — Other Ambulatory Visit: Payer: Self-pay | Admitting: *Deleted

## 2013-11-15 DIAGNOSIS — R0602 Shortness of breath: Secondary | ICD-10-CM

## 2013-11-15 DIAGNOSIS — I5189 Other ill-defined heart diseases: Secondary | ICD-10-CM

## 2013-11-21 ENCOUNTER — Encounter: Payer: Self-pay | Admitting: Cardiovascular Disease

## 2013-11-21 ENCOUNTER — Ambulatory Visit (INDEPENDENT_AMBULATORY_CARE_PROVIDER_SITE_OTHER): Payer: BC Managed Care – PPO | Admitting: Cardiovascular Disease

## 2013-11-21 ENCOUNTER — Encounter: Payer: Self-pay | Admitting: *Deleted

## 2013-11-21 VITALS — BP 145/79 | HR 69 | Ht 63.0 in | Wt 272.0 lb

## 2013-11-21 DIAGNOSIS — R0989 Other specified symptoms and signs involving the circulatory and respiratory systems: Secondary | ICD-10-CM

## 2013-11-21 DIAGNOSIS — R0609 Other forms of dyspnea: Secondary | ICD-10-CM

## 2013-11-21 DIAGNOSIS — I5031 Acute diastolic (congestive) heart failure: Secondary | ICD-10-CM

## 2013-11-21 DIAGNOSIS — I119 Hypertensive heart disease without heart failure: Secondary | ICD-10-CM

## 2013-11-21 DIAGNOSIS — R609 Edema, unspecified: Secondary | ICD-10-CM

## 2013-11-21 DIAGNOSIS — R9431 Abnormal electrocardiogram [ECG] [EKG]: Secondary | ICD-10-CM

## 2013-11-21 DIAGNOSIS — R600 Localized edema: Secondary | ICD-10-CM

## 2013-11-21 DIAGNOSIS — I10 Essential (primary) hypertension: Secondary | ICD-10-CM

## 2013-11-21 DIAGNOSIS — R0600 Dyspnea, unspecified: Secondary | ICD-10-CM

## 2013-11-21 DIAGNOSIS — R0789 Other chest pain: Secondary | ICD-10-CM

## 2013-11-21 MED ORDER — POTASSIUM CHLORIDE CRYS ER 20 MEQ PO TBCR: 20.0000 meq | EXTENDED_RELEASE_TABLET | Freq: Every day | ORAL | Status: DC

## 2013-11-21 MED ORDER — CHLORTHALIDONE 25 MG PO TABS
25.0000 mg | ORAL_TABLET | Freq: Every day | ORAL | Status: DC
Start: 2013-11-21 — End: 2013-12-06

## 2013-11-21 NOTE — Progress Notes (Signed)
[name], female   DOB: 04-25-1956, 58 y.o.   MRN: [id number]       CARDIOLOGY CONSULT NOTE  [name] MRN: [id number] DOB/AGE: 31-Dec-1955 58 y.o.  Admit date: (Not on file) Primary Physician Vic Blackbird, MD  Reason for Consultation: hypertensive heart disease, leg and abdominal swelling, DOE  HPI: The patient is a 58 year old woman with a past medical history significant for hypertension, hypothyroidism (Grave's disease diagnosed in 1986 treated with radioactive iodine), hyperlipidemia, and obesity who has been experiencing increasing leg and abdominal swelling. She has also had dyspnea on exertion with relatively minimal activity. She had been taking Lasix 20 mg daily which was briefly increased to 40 mg twice daily and she now takes it daily as needed. Dr. Buelah Manis obtained an echocardiogram which demonstrated normal left ventricular systolic function, EF 10-25%, moderate LVH, grade 1 diastolic dysfunction with elevated filling pressures, aortic annular and mitral annular calcification, mild mitral and aortic leaflet thickening and calcification, mild to moderate left atrial enlargement, and mild mitral regurgitation.  An ECG performed in the office today demonstrates normal sinus rhythm with a nonspecific intraventricular conduction delay with a QRS of 108 ms. Labs obtained by her PCP demonstrate a normal BNP of 46, BUN 14, creatinine 0.77, and hemoglobin 11.9.  She feels that her right clavicle is causing her some dyspnea. She very seldom has chest discomfort. She believes she started having difficulty with high blood pressure at the time she was diagnosed with Graves' disease. Her carvedilol was increased to 25 mg twice daily approximately 3 months ago. Her blood pressure today is 145/79.  Lasix 40 mg had been alleviating her leg swelling but she has had more difficulty with this recently, over the last 3-4 weeks.  No Known Allergies  Current  Outpatient Prescriptions  Medication Sig Dispense Refill  . allopurinol (ZYLOPRIM) 100 MG tablet TAKE (1) TABLET BY MOUTH ONCE DAILY FOR GOUT.  30 tablet  1  . aspirin 81 MG tablet Take 1 tablet (81 mg total) by mouth daily.  30 tablet  1  . carvedilol (COREG) 25 MG tablet TAKE 1 TABLET BY MOUTH TWICE DAILY  60 tablet  6  . furosemide (LASIX) 40 MG tablet TAKE (1) TABLET BY MOUTH ONCE DAILY AS NEEDED.  30 tablet  6  . levothyroxine (SYNTHROID, LEVOTHROID) 200 MCG tablet TAKE (1) TABLET DAILY FOR THYROID.  30 tablet  5  . losartan (COZAAR) 100 MG tablet TAKE (1) TABLET BY MOUTH DAILY FOR HIGH BLOOD PRESSURE.  30 tablet  5  . Multiple Vitamins-Minerals (CENTRUM SILVER PO) Take 1 tablet by mouth daily.      . potassium chloride SA (K-DUR,KLOR-CON) 20 MEQ tablet Take 1 tablet (20 mEq total) by mouth daily. With AM lasix  30 tablet  3  . tolterodine (DETROL LA) 4 MG 24 hr capsule TAKE (1) CAPSULE BY MOUTH ONCE DAILY.  30 capsule  2  . traZODone (DESYREL) 50 MG tablet Take 1-2 tablets at bedtime for sleep  60 tablet  3   No current facility-administered medications for this visit.    Past Medical History  Diagnosis Date  . Hyperlipidemia   . HTN (hypertension)   . DM (diabetes mellitus)     Type II controlled  . Enlarged heart   . OSA on CPAP   . Lymphocytosis   . Nephrolithiasis   . Adrenal mass, left   . Grave's disease     Past Surgical History  Procedure Laterality Date  . Lithotripsy    . Thyroid ablation  1986  . S/p hysterectomy  10/1996    with bilat SOO seconardt to fibroids  . Hernia repair  2011    History   Social History  . Marital Status: Married    Spouse Name: N/A    Number of Children: N/A  . Years of Education: N/A   Occupational History  . Not on file.   Social History Main Topics  . Smoking status: Former Research scientist (life sciences)  . Smokeless tobacco: Never Used  . Alcohol Use: No  . Drug Use: No  . Sexual Activity: Yes   Other Topics Concern  . Not on file    Social History Narrative  . No narrative on file     No family history of premature CAD in 1st degree relatives.  Prior to Admission medications   Medication Sig Start Date End Date Taking? Authorizing Provider  allopurinol (ZYLOPRIM) 100 MG tablet TAKE (1) TABLET BY MOUTH ONCE DAILY FOR GOUT. 11/22/12   Alycia Rossetti, MD  aspirin 81 MG tablet Take 1 tablet (81 mg total) by mouth daily. 11/22/12   Alycia Rossetti, MD  carvedilol (COREG) 25 MG tablet TAKE 1 TABLET BY MOUTH TWICE DAILY 02/15/13   Alycia Rossetti, MD  furosemide (LASIX) 40 MG tablet TAKE (1) TABLET BY MOUTH ONCE DAILY AS NEEDED. 11/08/13   Alycia Rossetti, MD  levothyroxine (SYNTHROID, LEVOTHROID) 200 MCG tablet TAKE (1) TABLET DAILY FOR THYROID. 03/29/13   Alycia Rossetti, MD  losartan (COZAAR) 100 MG tablet TAKE (1) TABLET BY MOUTH DAILY FOR HIGH BLOOD PRESSURE. 03/29/13   Alycia Rossetti, MD  Multiple Vitamins-Minerals (CENTRUM SILVER PO) Take 1 tablet by mouth daily.    Historical Provider, MD  potassium chloride SA (K-DUR,KLOR-CON) 20 MEQ tablet Take 1 tablet (20 mEq total) by mouth daily. With AM lasix 11/08/13   Alycia Rossetti, MD  tolterodine (DETROL LA) 4 MG 24 hr capsule TAKE (1) CAPSULE BY MOUTH ONCE DAILY. 05/13/13   Alycia Rossetti, MD  traZODone (DESYREL) 50 MG tablet Take 1-2 tablets at bedtime for sleep 05/07/13   Alycia Rossetti, MD     Review of systems complete and found to be negative unless listed above in HPI     Physical exam Height 5\' 3"  (1.6 m), weight 272 lb (123.378 kg). General: NAD, obese Neck: No JVD, no thyromegaly or thyroid nodule.  Lungs: Clear to auscultation bilaterally with normal respiratory effort. CV: Nondisplaced PMI. Regular rate and rhythm, normal S1/S2, no S3/S4, soft II/VI ejection systolic murmur over RUSB. 1+ pitting pretibial and periankle edema.  No carotid bruit.  Normal pedal pulses.  Abdomen: Soft, obese, nontender.  Skin: Intact without lesions or rashes.   Neurologic: Alert and oriented x 3.  Psych: Normal affect. Extremities: No clubbing or cyanosis.  HEENT: Normal.   Labs:   Lab Results  Component Value Date   WBC 7.9 11/08/2013   HGB 11.9* 11/08/2013   HCT 36.8 11/08/2013   MCV 80.0 11/08/2013   PLT 230 11/08/2013   No results found for this basename: NA, K, CL, CO2, BUN, CREATININE, CALCIUM, LABALBU, PROT, BILITOT, ALKPHOS, ALT, AST, GLUCOSE,  in the last 168 hours No results found for this basename: CKTOTAL, CKMB, CKMBINDEX, TROPONINI    Lab Results  Component Value Date   CHOL 170 09/06/2013   CHOL 208* 09/14/2012   CHOL 171 07/15/2011   Lab Results  Component  Value Date   HDL 57 09/06/2013   HDL 60 09/14/2012   HDL 50 07/15/2011   Lab Results  Component Value Date   LDLCALC 101* 09/06/2013   LDLCALC 134* 09/14/2012   LDLCALC 107* 07/15/2011   Lab Results  Component Value Date   TRIG 59 09/06/2013   TRIG 72 09/14/2012   TRIG 71 07/15/2011   Lab Results  Component Value Date   CHOLHDL 3.0 09/06/2013   CHOLHDL 3.5 09/14/2012   CHOLHDL 3.4 07/15/2011   Lab Results  Component Value Date   LDLDIRECT 110* 01/21/2012         Studies: - Procedure narrative: Transthoracic echocardiography. Image quality was suboptimal, due to poor sound transmission. - Left ventricle: The cavity size was normal. Wall thickness was increased in a pattern of moderate LVH. Systolic function was normal. The estimated ejection fraction was in the range of 55% to 60%. Images were inadequate for LV wall motion assessment. There was an increased relative contribution of atrial contraction to ventricular filling. Doppler parameters are consistent with abnormal left ventricular relaxation (grade 1 diastolic dysfunction). Doppler parameters are consistent with high ventricular filling pressure. - Aortic valve: Mildly calcified annulus. Trileaflet; mildly thickened, mildly calcified leaflets. - Mitral valve: Calcified annulus. Mildly thickened,  mildly calcified leaflets . There was mild regurgitation. - Left atrium: The atrium was mildly to moderately dilated. - Tricuspid valve: There was trivial regurgitation.   ASSESSMENT AND PLAN:  1. Increasing dyspnea on exertion with bilateral leg edema: She has definitive evidence of hypertensive heart disease with moderate LVH, grade 1 diastolic dysfunction, and elevated filling pressures, resulting in diastolic heart failure. In order to alleviate her symptoms and optimize her antihypertensive regimen, I will start chlorthalidone 25 mg daily. I have instructed her to take potassium chloride along with this. She has a nonspecific T wave abnormality on her ECG, and aortic and mitral annular and leaflet calcification. I will obtain a Lexiscan Cardiolite to evaluate for occult ischemic heart disease. 2. HTN: Mildly elevated today. In the context of her hypertensive heart disease, I will make the previously mentioned medication adjustments with the addition of chlorthalidone 25 mg daily.  Dispo: f/u 1 month.  Signed: Kate Sable, M.D., F.A.C.C.  11/21/2013, 2:54 PM

## 2013-11-21 NOTE — Patient Instructions (Signed)
   Continue taking Lasix as needed only  Begin Chlorthalidone 25mg  daily - new sent to pharm  Continue taking Potassium, but take with the Chlorthalidone Continue all other medications.   Your physician has requested that you have a lexiscan myoview. For further information please visit [email or web address]. Please follow instruction sheet, as given. Office will contact with results via phone or letter.   Follow up in  1 month

## 2013-11-25 ENCOUNTER — Encounter: Payer: Self-pay | Admitting: Family Medicine

## 2013-11-29 ENCOUNTER — Encounter (HOSPITAL_COMMUNITY)
Admission: RE | Admit: 2013-11-29 | Discharge: 2013-11-29 | Disposition: A | Discharge status: Home / Self Care | Payer: BC Managed Care – PPO | Source: Ambulatory Visit | Attending: Cardiovascular Disease | Admitting: Cardiovascular Disease

## 2013-11-29 ENCOUNTER — Encounter (HOSPITAL_COMMUNITY): Payer: Self-pay

## 2013-11-29 DIAGNOSIS — R079 Chest pain, unspecified: Secondary | ICD-10-CM | POA: Insufficient documentation

## 2013-11-29 DIAGNOSIS — R9431 Abnormal electrocardiogram [ECG] [EKG]: Secondary | ICD-10-CM | POA: Insufficient documentation

## 2013-11-29 DIAGNOSIS — R0989 Other specified symptoms and signs involving the circulatory and respiratory systems: Secondary | ICD-10-CM | POA: Insufficient documentation

## 2013-11-29 DIAGNOSIS — R0600 Dyspnea, unspecified: Secondary | ICD-10-CM

## 2013-11-29 DIAGNOSIS — R0789 Other chest pain: Secondary | ICD-10-CM

## 2013-11-29 DIAGNOSIS — R0609 Other forms of dyspnea: Secondary | ICD-10-CM | POA: Insufficient documentation

## 2013-11-29 MED ORDER — TECHNETIUM TC 99M SESTAMIBI GENERIC - CARDIOLITE
10.0000 | Freq: Once | INTRAVENOUS | Status: AC | PRN
  Administered 2013-11-29: 10 via INTRAVENOUS

## 2013-11-29 MED ORDER — SODIUM CHLORIDE 0.9 % IJ SOLN
INTRAMUSCULAR | Status: AC
  Administered 2013-11-29: 10 mL via INTRAVENOUS
  Filled 2013-11-29: qty 10

## 2013-11-29 MED ORDER — TECHNETIUM TC 99M SESTAMIBI - CARDIOLITE
30.0000 | Freq: Once | INTRAVENOUS | Status: AC | PRN
  Administered 2013-11-29: 30 via INTRAVENOUS

## 2013-11-29 MED ORDER — SODIUM CHLORIDE 0.9 % IJ SOLN
10.0000 mL | INTRAMUSCULAR | Status: DC | PRN
  Administered 2013-11-29: 10 mL via INTRAVENOUS

## 2013-11-29 MED ORDER — REGADENOSON 0.4 MG/5ML IV SOLN
INTRAVENOUS | Status: AC
  Administered 2013-11-29: 0.4 mg via INTRAVENOUS
  Filled 2013-11-29: qty 5

## 2013-11-29 MED ORDER — REGADENOSON 0.4 MG/5ML IV SOLN
0.4000 mg | Freq: Once | INTRAVENOUS | Status: AC | PRN
  Administered 2013-11-29: 0.4 mg via INTRAVENOUS

## 2013-11-29 NOTE — Progress Notes (Signed)
Stress Lab Nurses Notes - Frances Hughes  Frances Hughes 11/29/2013 Reason for doing test: Dyspnea Type of test: Wille Glaser Nurse performing test: Gerrit Halls, RN Nuclear Medicine Tech: Melburn Hake Echo Tech: Not Applicable MD performing test: Konesaran/K.Purcell Nails NP Family MD: [facility] explained and consent signed: Yes.   IV started: 22g jelco, Saline lock flushed, No redness or edema and Saline lock started in radiology Symptoms: SOB & Nausea Treatment/Intervention: None Reason test stopped: protocol completed After recovery IV was: Discontinued via X-ray tech and No redness or edema Patient to return to Nuc. Med at :10:45 Patient discharged: Home Patient's Condition upon discharge was: stable Comments: During test BP  197/112 & HR 96.  Recovery BP 190/107 & HR 79.  Symptoms resolved in recovery. Geanie Cooley T

## 2013-12-05 ENCOUNTER — Telehealth: Payer: Self-pay | Admitting: *Deleted

## 2013-12-05 NOTE — Telephone Encounter (Signed)
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 Needs aggressive BP control. I started chlorthalidone at last visit. Please have her monitor BP's 4-5 times per week and log them so that we can discuss at f/u ov. ------

## 2013-12-05 NOTE — Telephone Encounter (Signed)
Notes Recorded by Laurine Blazer, LPN on 0/71/2197 at 58:83 AM Patient notified. Follow up already scheduled for 12/20/2013 with Dr. Harl Bowie.

## 2013-12-06 ENCOUNTER — Encounter: Payer: Self-pay | Admitting: Family Medicine

## 2013-12-06 ENCOUNTER — Ambulatory Visit (INDEPENDENT_AMBULATORY_CARE_PROVIDER_SITE_OTHER): Payer: BC Managed Care – PPO | Admitting: Family Medicine

## 2013-12-06 VITALS — BP 136/88 | HR 72 | Temp 97.7°F | Resp 14 | Ht 62.0 in | Wt 254.0 lb

## 2013-12-06 DIAGNOSIS — Z800 Family history of malignant neoplasm of digestive organs: Secondary | ICD-10-CM

## 2013-12-06 DIAGNOSIS — I10 Essential (primary) hypertension: Secondary | ICD-10-CM

## 2013-12-06 DIAGNOSIS — Z130 Encounter for screening for diseases of the blood and blood-forming organs and certain disorders involving the immune mechanism: Secondary | ICD-10-CM

## 2013-12-06 DIAGNOSIS — E278 Other specified disorders of adrenal gland: Secondary | ICD-10-CM

## 2013-12-06 DIAGNOSIS — E785 Hyperlipidemia, unspecified: Secondary | ICD-10-CM

## 2013-12-06 DIAGNOSIS — R490 Dysphonia: Secondary | ICD-10-CM

## 2013-12-06 DIAGNOSIS — Z13228 Encounter for screening for other metabolic disorders: Secondary | ICD-10-CM

## 2013-12-06 DIAGNOSIS — G4733 Obstructive sleep apnea (adult) (pediatric): Secondary | ICD-10-CM

## 2013-12-06 DIAGNOSIS — E1149 Type 2 diabetes mellitus with other diabetic neurological complication: Secondary | ICD-10-CM

## 2013-12-06 DIAGNOSIS — I5189 Other ill-defined heart diseases: Secondary | ICD-10-CM | POA: Insufficient documentation

## 2013-12-06 DIAGNOSIS — Z1329 Encounter for screening for other suspected endocrine disorder: Secondary | ICD-10-CM

## 2013-12-06 DIAGNOSIS — Z0000 Encounter for general adult medical examination without abnormal findings: Secondary | ICD-10-CM | POA: Insufficient documentation

## 2013-12-06 DIAGNOSIS — Z1231 Encounter for screening mammogram for malignant neoplasm of breast: Secondary | ICD-10-CM

## 2013-12-06 DIAGNOSIS — Z1321 Encounter for screening for nutritional disorder: Secondary | ICD-10-CM

## 2013-12-06 DIAGNOSIS — Z13820 Encounter for screening for osteoporosis: Secondary | ICD-10-CM

## 2013-12-06 DIAGNOSIS — Z0001 Encounter for general adult medical examination with abnormal findings: Secondary | ICD-10-CM | POA: Insufficient documentation

## 2013-12-06 DIAGNOSIS — I519 Heart disease, unspecified: Secondary | ICD-10-CM

## 2013-12-06 DIAGNOSIS — Z1211 Encounter for screening for malignant neoplasm of colon: Secondary | ICD-10-CM

## 2013-12-06 LAB — CBC WITH DIFFERENTIAL/PLATELET
Basophils Absolute: 0 10*3/uL (ref 0.0–0.1)
Basophils Relative: 0 % (ref 0–1)
Eosinophils Absolute: 0.2 10*3/uL (ref 0.0–0.7)
Eosinophils Relative: 3 % (ref 0–5)
HEMATOCRIT: 38.4 % (ref 36.0–46.0)
HEMOGLOBIN: 12.7 g/dL (ref 12.0–15.0)
LYMPHS PCT: 36 % (ref 12–46)
Lymphs Abs: 2.4 10*3/uL (ref 0.7–4.0)
MCH: 25.7 pg — ABNORMAL LOW (ref 26.0–34.0)
MCHC: 33.1 g/dL (ref 30.0–36.0)
MCV: 77.7 fL — ABNORMAL LOW (ref 78.0–100.0)
MONOS PCT: 6 % (ref 3–12)
Monocytes Absolute: 0.4 10*3/uL (ref 0.1–1.0)
NEUTROS ABS: 3.6 10*3/uL (ref 1.7–7.7)
NEUTROS PCT: 55 % (ref 43–77)
Platelets: 248 10*3/uL (ref 150–400)
RBC: 4.94 MIL/uL (ref 3.87–5.11)
RDW: 14.4 % (ref 11.5–15.5)
WBC: 6.6 10*3/uL (ref 4.0–10.5)

## 2013-12-06 LAB — LIPID PANEL
CHOL/HDL RATIO: 3.6 ratio
CHOLESTEROL: 180 mg/dL (ref 0–200)
HDL: 50 mg/dL (ref >39)
LDL Cholesterol: 114 mg/dL — ABNORMAL HIGH (ref 0–99)
TRIGLYCERIDES: 82 mg/dL (ref <150)
VLDL: 16 mg/dL (ref 0–40)

## 2013-12-06 LAB — COMPREHENSIVE METABOLIC PANEL
ALBUMIN: 3.7 g/dL (ref 3.5–5.2)
ALT: 13 U/L (ref 0–35)
AST: 16 U/L (ref 0–37)
Alkaline Phosphatase: 91 U/L (ref 39–117)
BUN: 15 mg/dL (ref 6–23)
CALCIUM: 9.5 mg/dL (ref 8.4–10.5)
CHLORIDE: 100 meq/L (ref 96–112)
CO2: 33 mEq/L — ABNORMAL HIGH (ref 19–32)
Creat: 0.73 mg/dL (ref 0.50–1.10)
GLUCOSE: 110 mg/dL — AB (ref 70–99)
POTASSIUM: 4 meq/L (ref 3.5–5.3)
SODIUM: 141 meq/L (ref 135–145)
TOTAL PROTEIN: 7.4 g/dL (ref 6.0–8.3)
Total Bilirubin: 0.5 mg/dL (ref 0.2–1.2)

## 2013-12-06 LAB — HEMOGLOBIN A1C
Hgb A1c MFr Bld: 6.2 % — ABNORMAL HIGH (ref <5.7)
MEAN PLASMA GLUCOSE: 131 mg/dL — AB (ref <117)

## 2013-12-06 MED ORDER — CHLORTHALIDONE 25 MG PO TABS: 25.0000 mg | ORAL_TABLET | Freq: Every day | ORAL | Status: DC

## 2013-12-06 MED ORDER — CARVEDILOL 25 MG PO TABS: ORAL_TABLET | ORAL | Status: DC

## 2013-12-06 MED ORDER — LOSARTAN POTASSIUM 100 MG PO TABS: ORAL_TABLET | ORAL | Status: DC

## 2013-12-06 MED ORDER — POTASSIUM CHLORIDE CRYS ER 20 MEQ PO TBCR: 20.0000 meq | EXTENDED_RELEASE_TABLET | Freq: Every day | ORAL | Status: DC

## 2013-12-06 MED ORDER — LEVOTHYROXINE SODIUM 200 MCG PO TABS: ORAL_TABLET | ORAL | Status: DC

## 2013-12-06 NOTE — Assessment & Plan Note (Signed)
Diabetes is diet controlled I will check an A1c for an examination done today She's had recent eye appointment

## 2013-12-06 NOTE — Assessment & Plan Note (Signed)
Referral to ENT for evaluation

## 2013-12-06 NOTE — Addendum Note (Signed)
Addended by: Sheral Flow on: 12/06/2013 10:59 AM   Modules accepted: Orders

## 2013-12-06 NOTE — Patient Instructions (Signed)
I recommend eye visit once a year I recommend dental visit every 6 months Goal is to  Exercise 30 minutes 5 days a week We will send a letter with lab results  Mammogram, colonoscopy, Bone Destiny to be done Referral for ENT for voice CT for adrenal will be done in the fall Take the lasix as needed F/U 3 months

## 2013-12-06 NOTE — Assessment & Plan Note (Signed)
Frances Hughes physical done. Mammogram colonoscopy bone density to be set up here she is first degree relatives with colon cancer her sister died with colon cancer. Her immunizations are up-to-date. Fasting labs will be done.

## 2013-12-06 NOTE — Assessment & Plan Note (Signed)
Blood pressure is improved today she'll continue the chlorthalidone have advised her to use a couple days of Lasix to help the leg swelling she will follow with cardiology in 2 weeks

## 2013-12-06 NOTE — Assessment & Plan Note (Signed)
Will send a prescription for new supplies for her CPAP she does not use on a regular basis. She states when she does lose weight and she feels like she does not need to breathe however during the daytime she's been napping more and feels fatigued he is working she is uses a more regular basis we will get her updated equipment

## 2013-12-06 NOTE — Progress Notes (Signed)
[name], female   DOB: [birth year], 58 y.o.   MRN: [id number]   Subjective:    [name], female    DOB: [birth year], 58 y.o.   MRN: [id number]  Patient presents for CPE- no PAP  Patient here for physical exam. She's status post hysterectomy therefore does not need Pap smear. She's due for mammogram as well as bone density. She's to family history of colon cancer in first degree relatives including her mother and brother and sister who have had colon cancer. Her last colonoscopy was in 2011. Her immunizations are up-to-date  She was seen by cardiology secondary to diastolic dysfunction shortness of breath. Surest test was done which was fairly unremarkable. She is started on chlorthalidone to help with blood pressure control and continued on Lasix as needed with her other medications. She continues to have that sensation of not being her breathing her throat as well as raspiness of her voice that comes and goes. She was told she may have had some type of nodule in her throat in the past when she was evaluated by ear nose and throat.  Adrenal adenoma/last she was noted to have an adrenal mass many years ago her last scan was in 2013 she's due for repeat followup on this.   Review Of Systems:  GEN- denies fatigue, fever, weight loss,weakness, recent illness HEENT- denies eye drainage, change in vision, nasal discharge, CVS- denies chest pain, palpitations RESP- +SOB, cough, wheeze ABD- denies N/V, change in stools, abd pain GU- denies dysuria, hematuria, dribbling, incontinence MSK- denies joint pain, muscle aches, injury Neuro- denies headache, dizziness, syncope, seizure activity       Objective:    BP 136/88  Pulse 72  Temp(Src) 97.7 F (36.5 C) (Oral)  Resp 14  Ht 5\' 2"  (1.575 m)  Wt 254 lb (115.214 kg)  BMI 46.45 kg/m2 GEN- NAD, alert and oriented x3 HEENT- PERRL, EOMI, non injected sclera, pink conjunctiva, MMM, oropharynx clear Neck-  Supple, no thyromegaly. No JVD CVS- RRR, soft 2/6 SEM at Right sternal border RESP-CTAB, normal WOB ABD-NABS,soft,NT,ND EXT- 1+ edema to shins Pulses- Radial, DP-  decreased Bilat         Assessment & Plan:      Problem List Items Addressed This Visit   Routine general medical examination at a health care facility   HYPERTENSION - Primary   Relevant Orders      CBC with Differential      Comprehensive metabolic panel   HYPERLIPIDEMIA   Relevant Orders      Lipid panel   DIABETES MELLITUS, TYPE II, CONTROLLED, W/NEURO COMPS   Relevant Orders      Hemoglobin A1c    Other Visit Diagnoses   Encounter for vitamin deficiency screening        Relevant Orders       Vitamin D, 25-hydroxy       Note: This dictation was prepared with Dragon dictation along with smaller phrase technology. Any transcriptional errors that result from this process are unintentional.

## 2013-12-06 NOTE — Assessment & Plan Note (Signed)
She is due for followup on the adrenal adenoma. She'll have CT scan done we will get this done in the fall

## 2013-12-07 LAB — VITAMIN D 25 HYDROXY (VIT D DEFICIENCY, FRACTURES): Vit D, 25-Hydroxy: 22 ng/mL — ABNORMAL LOW (ref 30–89)

## 2013-12-09 ENCOUNTER — Encounter (INDEPENDENT_AMBULATORY_CARE_PROVIDER_SITE_OTHER): Payer: Self-pay | Admitting: *Deleted

## 2013-12-09 ENCOUNTER — Encounter: Payer: Self-pay | Admitting: *Deleted

## 2013-12-12 ENCOUNTER — Other Ambulatory Visit: Payer: Self-pay | Admitting: *Deleted

## 2013-12-12 MED ORDER — ERGOCALCIFEROL 1.25 MG (50000 UT) PO CAPS: 50000.0000 [IU] | ORAL_CAPSULE | ORAL | Status: DC

## 2013-12-20 ENCOUNTER — Encounter: Payer: Self-pay | Admitting: Cardiovascular Disease

## 2013-12-20 ENCOUNTER — Ambulatory Visit (INDEPENDENT_AMBULATORY_CARE_PROVIDER_SITE_OTHER): Payer: BC Managed Care – PPO | Admitting: Cardiovascular Disease

## 2013-12-20 VITALS — BP 158/96 | HR 59 | Ht 63.0 in | Wt 265.0 lb

## 2013-12-20 DIAGNOSIS — Z7182 Exercise counseling: Secondary | ICD-10-CM

## 2013-12-20 DIAGNOSIS — R0609 Other forms of dyspnea: Secondary | ICD-10-CM

## 2013-12-20 DIAGNOSIS — R0600 Dyspnea, unspecified: Secondary | ICD-10-CM

## 2013-12-20 DIAGNOSIS — R0989 Other specified symptoms and signs involving the circulatory and respiratory systems: Secondary | ICD-10-CM

## 2013-12-20 DIAGNOSIS — I119 Hypertensive heart disease without heart failure: Secondary | ICD-10-CM

## 2013-12-20 DIAGNOSIS — R600 Localized edema: Secondary | ICD-10-CM

## 2013-12-20 DIAGNOSIS — I10 Essential (primary) hypertension: Secondary | ICD-10-CM

## 2013-12-20 DIAGNOSIS — R609 Edema, unspecified: Secondary | ICD-10-CM

## 2013-12-20 DIAGNOSIS — Z7189 Other specified counseling: Secondary | ICD-10-CM

## 2013-12-20 MED ORDER — AMLODIPINE BESYLATE 5 MG PO TABS: 5.0000 mg | ORAL_TABLET | Freq: Every day | ORAL | Status: DC

## 2013-12-20 NOTE — Patient Instructions (Signed)
   Begin Amlodipine 5mg  daily - new sent to pharm Continue all other medications.   Follow up in  3 months

## 2013-12-20 NOTE — Progress Notes (Signed)
[name], female   DOB: Jan 05, 1956, 58 y.o.   MRN: [id number]      SUBJECTIVE: The patient returns for followup on cardiovascular testing performed for the evaluation of increasing dyspnea on exertion and fatigue. Nuclear myocardial perfusion was reasonably normal soft tissue attenuation artifact noted. She was noted to have malignant hypertension with a resting blood pressure of 212/108.  In summary, she has a past medical history significant for hypertension, hypothyroidism (Grave's disease diagnosed in 1986 treated with radioactive iodine), hyperlipidemia, and obesity. Prior echocardiogram demonstrated normal left ventricular systolic function, EF 78-29%, moderate LVH, grade 1 diastolic dysfunction with elevated filling pressures, aortic annular and mitral annular calcification, mild mitral and aortic leaflet thickening and calcification, mild to moderate left atrial enlargement, and mild mitral regurgitation.  She was recently prescribed new CPAP equipment by her PCP.  We had a long conversation today, and she told me that her 60 year old son who had recently graduated college passed away suddenly one year ago with a brain cyst Dola Factor). Life has been extremely challenging for her over the past one year. She denies chest pain. She does not get dyspneic with exertion, but when she gets emotional. She is not getting any physical activity, but knows that she needs to begin to take charge of her life. She, along with her family members have tried counseling through the church, but she continues to struggle emotionally.  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  Review of Systems: As per "subjective", otherwise negative.  No Known Allergies  Current Outpatient Prescriptions  Medication Sig Dispense Refill  . aspirin 81 MG tablet Take 1 tablet (81 mg total) by mouth daily.  30 tablet  1  . carvedilol (COREG) 25 MG tablet TAKE 1  TABLET BY MOUTH TWICE DAILY  180 tablet  3  . chlorthalidone (HYGROTON) 25 MG tablet Take 1 tablet (25 mg total) by mouth daily.  90 tablet  3  . ergocalciferol (VITAMIN D2) 50000 UNITS capsule Take 1 capsule (50,000 Units total) by mouth once a week.  12 capsule  0  . furosemide (LASIX) 20 MG tablet Take by mouth as needed.      Marland Kitchen levothyroxine (SYNTHROID, LEVOTHROID) 200 MCG tablet TAKE (1) TABLET DAILY FOR THYROID.  90 tablet  3  . losartan (COZAAR) 100 MG tablet TAKE (1) TABLET BY MOUTH DAILY FOR HIGH BLOOD PRESSURE.  90 tablet  3  . Multiple Vitamins-Minerals (CENTRUM SILVER PO) Take 1 tablet by mouth daily.      . potassium chloride SA (K-DUR,KLOR-CON) 20 MEQ tablet Take 20 mEq by mouth daily. Prn only when pt takes lasix       No current facility-administered medications for this visit.    Past Medical History  Diagnosis Date  . Hyperlipidemia   . HTN (hypertension)   . DM (diabetes mellitus)     Type II controlled  . Enlarged heart   . OSA on CPAP   . Lymphocytosis   . Nephrolithiasis   . Adrenal mass, left   . Grave's disease     Past Surgical History  Procedure Laterality Date  . Lithotripsy    . Thyroid ablation  1986  . S/p hysterectomy  10/1996    with bilat SOO seconardt to fibroids  . Hernia repair  2011    History   Social History  . Marital Status: Married    Spouse Name: N/A    Number  of Children: N/A  . Years of Education: N/A   Occupational History  . Not on file.   Social History Main Topics  . Smoking status: Former Research scientist (life sciences)  . Smokeless tobacco: Never Used  . Alcohol Use: No  . Drug Use: No  . Sexual Activity: Yes   Other Topics Concern  . Not on file   Social History Narrative  . No narrative on file     Filed Vitals:   12/20/13 1325  BP: 158/96  Pulse: 59  Height: 5\' 3"  (1.6 m)  Weight: 265 lb (120.203 kg)    PHYSICAL EXAM General: NAD Neck: No JVD, no thyromegaly. Lungs: Clear to auscultation bilaterally with normal  respiratory effort. CV: Nondisplaced PMI.  Regular rate and rhythm, normal S1/S2, no S3/S4, no murmur. No pretibial or periankle edema.  No carotid bruit.  Normal pedal pulses.  Abdomen: Soft, nontender, no hepatosplenomegaly, no distention.  Neurologic: Alert and oriented x 3.  Psych: Normal affect. Extremities: No clubbing or cyanosis.   ECG: reviewed and available in electronic records.      ASSESSMENT AND PLAN: 1. Dyspnea on exertion with bilateral leg edema: She has definitive evidence of hypertensive heart disease with moderate LVH, grade 1 diastolic dysfunction, and elevated filling pressures, resulting in diastolic heart failure. In order to alleviate her symptoms and optimize her antihypertensive regimen, I will add amlodipine 5 mg daily. Lexiscan Cardiolite demonstrated reasonable myocardial perfusion.  I discussed exercise counseling at length with her, and she appears to be motivated.  2. Malignant HTN: Remains elevated today. In the context of her hypertensive heart disease, I will add amlodipine 5 mg daily in addition to carvedilol 25 mg twice daily, chlorthalidone 25 mg daily and losartan 100 mg daily.  3. Grief: We talked at length about her struggles and I tried to offer emotional support.  Dispo: f/u 3 months.  Kate Sable, M.D., F.A.C.C.

## 2014-01-09 ENCOUNTER — Ambulatory Visit (INDEPENDENT_AMBULATORY_CARE_PROVIDER_SITE_OTHER): Payer: BC Managed Care – PPO | Admitting: Otolaryngology

## 2014-01-09 DIAGNOSIS — K219 Gastro-esophageal reflux disease without esophagitis: Secondary | ICD-10-CM

## 2014-01-09 DIAGNOSIS — R490 Dysphonia: Secondary | ICD-10-CM

## 2014-02-06 ENCOUNTER — Ambulatory Visit (INDEPENDENT_AMBULATORY_CARE_PROVIDER_SITE_OTHER): Payer: BC Managed Care – PPO | Admitting: Otolaryngology

## 2014-02-06 DIAGNOSIS — K219 Gastro-esophageal reflux disease without esophagitis: Secondary | ICD-10-CM

## 2014-02-06 DIAGNOSIS — R490 Dysphonia: Secondary | ICD-10-CM

## 2014-03-14 ENCOUNTER — Ambulatory Visit: Payer: BC Managed Care – PPO | Admitting: Family Medicine

## 2014-03-21 ENCOUNTER — Ambulatory Visit (INDEPENDENT_AMBULATORY_CARE_PROVIDER_SITE_OTHER): Payer: BC Managed Care – PPO | Admitting: Cardiovascular Disease

## 2014-03-21 ENCOUNTER — Encounter: Payer: Self-pay | Admitting: Cardiovascular Disease

## 2014-03-21 VITALS — BP 166/92 | HR 62 | Resp 97 | Ht 63.0 in | Wt 268.0 lb

## 2014-03-21 DIAGNOSIS — Z719 Counseling, unspecified: Secondary | ICD-10-CM

## 2014-03-21 DIAGNOSIS — I119 Hypertensive heart disease without heart failure: Secondary | ICD-10-CM

## 2014-03-21 DIAGNOSIS — I10 Essential (primary) hypertension: Secondary | ICD-10-CM

## 2014-03-21 DIAGNOSIS — R600 Localized edema: Secondary | ICD-10-CM

## 2014-03-21 DIAGNOSIS — Z7182 Exercise counseling: Secondary | ICD-10-CM

## 2014-03-21 DIAGNOSIS — R0609 Other forms of dyspnea: Secondary | ICD-10-CM

## 2014-03-21 DIAGNOSIS — R0600 Dyspnea, unspecified: Secondary | ICD-10-CM

## 2014-03-21 DIAGNOSIS — G4733 Obstructive sleep apnea (adult) (pediatric): Secondary | ICD-10-CM

## 2014-03-21 MED ORDER — FUROSEMIDE 20 MG PO TABS: 20.0000 mg | ORAL_TABLET | ORAL | Status: DC | PRN

## 2014-03-21 NOTE — Progress Notes (Signed)
[name], female   DOB: 1955/11/25, 58 y.o.   MRN: [id number]      SUBJECTIVE: The patient has a history of malignant HTN with dyspnea on exertion and fatigue. Nuclear myocardial perfusion was reasonably normal soft tissue attenuation artifact noted. She was noted to have malignant hypertension with a resting blood pressure of 212/108. Past medical history is also significant for sleep apnea, hypothyroidism (Grave's disease diagnosed in 1984/09/14 treated with radioactive iodine), hyperlipidemia, and obesity. Prior echocardiogram demonstrated normal left ventricular systolic function, EF 89-21%, moderate LVH, grade 1 diastolic dysfunction with elevated filling pressures, aortic annular and mitral annular calcification, mild mitral and aortic leaflet thickening and calcification, mild to moderate left atrial enlargement, and mild mitral regurgitation.   She has been very busy with her job and thus does not take her medications daily, but tries to do so. She has over 1000 hours of sick leave which she has not taken. Her job is quite stressful. She has not been exercising. She has not gotten a new CPAP machine. She has bilateral leg swelling at the end of the day. She continues to mourn the loss of her son and questions what her purpose is in all of this. She does not routinely take her diuretic because it causes her to have to stop and use the bathroom when she is traveling.   Soc: Married. 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 Her 60 year old son who had recently graduated college passed away suddenly in 2012/09/14 with a brain cyst Dola Factor).      Review of Systems: As per "subjective", otherwise negative.  No Known Allergies  Current Outpatient Prescriptions  Medication Sig Dispense Refill  . amLODipine (NORVASC) 5 MG tablet Take 1 tablet (5 mg total) by mouth daily.  30 tablet  6  . aspirin 81 MG tablet Take 1 tablet (81 mg total) by  mouth daily.  30 tablet  1  . carvedilol (COREG) 25 MG tablet TAKE 1 TABLET BY MOUTH TWICE DAILY  180 tablet  3  . chlorthalidone (HYGROTON) 25 MG tablet Take 1 tablet (25 mg total) by mouth daily.  90 tablet  3  . ergocalciferol (VITAMIN D2) 50000 UNITS capsule Take 1 capsule (50,000 Units total) by mouth once a week.  12 capsule  0  . furosemide (LASIX) 20 MG tablet Take by mouth as needed.      Marland Kitchen levothyroxine (SYNTHROID, LEVOTHROID) 200 MCG tablet TAKE (1) TABLET DAILY FOR THYROID.  90 tablet  3  . losartan (COZAAR) 100 MG tablet TAKE (1) TABLET BY MOUTH DAILY FOR HIGH BLOOD PRESSURE.  90 tablet  3  . Multiple Vitamins-Minerals (CENTRUM SILVER PO) Take 1 tablet by mouth daily.      . potassium chloride SA (K-DUR,KLOR-CON) 20 MEQ tablet Take 20 mEq by mouth daily. Prn only when pt takes lasix       No current facility-administered medications for this visit.    Past Medical History  Diagnosis Date  . Hyperlipidemia   . HTN (hypertension)   . DM (diabetes mellitus)     Type II controlled  . Enlarged heart   . OSA on CPAP   . Lymphocytosis   . Nephrolithiasis   . Adrenal mass, left   . Grave's disease     Past Surgical History  Procedure Laterality Date  . Lithotripsy    . Thyroid ablation  1986  . S/p hysterectomy  10/1996  with bilat SOO seconardt to fibroids  . Hernia repair  2011    History   Social History  . Marital Status: Legally Separated    Spouse Name: N/A    Number of Children: N/A  . Years of Education: N/A   Occupational History  . Not on file.   Social History Main Topics  . Smoking status: Former Research scientist (life sciences)  . Smokeless tobacco: Never Used  . Alcohol Use: No  . Drug Use: No  . Sexual Activity: Yes   Other Topics Concern  . Not on file   Social History Narrative  . No narrative on file     Filed Vitals:   03/21/14 0832  BP: 166/92  Pulse: 62  Resp: 97  Height: 5\' 3"  (1.6 m)  Weight: 268 lb (121.564 kg)   Resp: 12-14  PHYSICAL  EXAM General: NAD HEENT: Normal. Neck: No JVD, no thyromegaly. Lungs: Clear to auscultation bilaterally with normal respiratory effort. CV: Nondisplaced PMI.  Regular rate and rhythm, normal S1/S2, no S3/S4, no murmur. Trace pretibial and periankle edema.  No carotid bruit.  Normal pedal pulses.  Abdomen: Soft, obese.  Neurologic: Alert and oriented x 3.  Psych: Normal affect. Skin: Normal. Musculoskeletal: Normal range of motion, no gross deformities. Extremities: No clubbing or cyanosis.   ECG: Most recent ECG reviewed.      ASSESSMENT AND PLAN: 1. Dyspnea on exertion with bilateral leg edema: We discussed the importance of taking her medications on a daily basis. I also discussed purchasing a pillbox. I recommended she purchase a blood pressure cuff so that she can monitor her blood pressures every other day over the course of the next 6 weeks so that I can determine if further antihypertensive titration is warranted. I also discussed the importance of obtaining a new continuous positive airway pressure machine. I also discussed exercise counseling. She has definitive evidence of hypertensive heart disease with moderate LVH, grade 1 diastolic dysfunction, and elevated filling pressures, resulting in diastolic heart failure. Will continue current medical therapy. Lexiscan Cardiolite demonstrated reasonable myocardial perfusion.  I again discussed exercise counseling at length with her, and she appears to be motivated.  2. Malignant HTN: Remains elevated today. Continue amlodipine 5 mg daily, carvedilol 25 mg twice daily, chlorthalidone 25 mg daily and losartan 100 mg daily. I discussed the importance of taking her medications on a daily basis, which she has not done. I also discussed the importance of treating sleep apnea as well as exercise. 3. Sleep apnea: The importance of weight loss and CPAP use was emphasized. 4. Obesity: Exercise counseling provided.  Dispo: f/u 3 months.  Time  spent: 40 minutes, of which greater than 50% was spent on counseling the patient on appropriate use of medications as well as treatment for underlying etiologies such as obesity and sleep apnea.  Kate Sable, M.D., F.A.C.C.

## 2014-03-21 NOTE — Patient Instructions (Signed)
Your physician has requested that you regularly monitor and record your blood pressure readings at home. Please check every other day x 6 weeks & return to office for MD review.  Lasix 20mg  as needed for leg swelling Continue all other medications.   Take medications DAILY as prescribed. Follow up in  3 months

## 2014-06-04 ENCOUNTER — Encounter (INDEPENDENT_AMBULATORY_CARE_PROVIDER_SITE_OTHER): Payer: Self-pay | Admitting: *Deleted

## 2014-06-19 ENCOUNTER — Ambulatory Visit: Payer: BC Managed Care – PPO | Admitting: Cardiovascular Disease

## 2014-07-03 ENCOUNTER — Encounter: Payer: Self-pay | Admitting: Cardiovascular Disease

## 2014-07-03 ENCOUNTER — Ambulatory Visit (INDEPENDENT_AMBULATORY_CARE_PROVIDER_SITE_OTHER): Payer: BLUE CROSS/BLUE SHIELD | Admitting: Cardiovascular Disease

## 2014-07-03 VITALS — BP 180/97 | HR 65 | Ht 63.0 in | Wt 269.0 lb

## 2014-07-03 DIAGNOSIS — I119 Hypertensive heart disease without heart failure: Secondary | ICD-10-CM

## 2014-07-03 DIAGNOSIS — G4733 Obstructive sleep apnea (adult) (pediatric): Secondary | ICD-10-CM

## 2014-07-03 DIAGNOSIS — Z719 Counseling, unspecified: Secondary | ICD-10-CM

## 2014-07-03 DIAGNOSIS — Z7182 Exercise counseling: Secondary | ICD-10-CM

## 2014-07-03 DIAGNOSIS — I10 Essential (primary) hypertension: Secondary | ICD-10-CM

## 2014-07-03 MED ORDER — AMLODIPINE BESYLATE 10 MG PO TABS: 10.0000 mg | ORAL_TABLET | Freq: Every day | ORAL | Status: DC

## 2014-07-03 NOTE — Progress Notes (Signed)
[name], female   DOB: 07/04/1955, 59 y.o.   MRN: [id number]      SUBJECTIVE: The patient has a history of malignant hypertension with dyspnea on exertion and fatigue. Nuclear myocardial perfusion in 7/15 was reasonably normal with soft tissue attenuation artifact noted. She was noted to have malignant hypertension with a resting blood pressure of 212/108. Past medical history is also significant for sleep apnea, hypothyroidism (Grave's disease diagnosed in 09-13-1984 treated with radioactive iodine), hyperlipidemia, and obesity. Echocardiogram in 6/15 demonstrated normal left ventricular systolic function, EF 19-41%, moderate LVH, grade 1 diastolic dysfunction with elevated filling pressures, aortic annular and mitral annular calcification, mild mitral and aortic leaflet thickening and calcification, mild to moderate left atrial enlargement, and mild mitral regurgitation.   She has not begun a walking program yet but plans on doing so with her husband in the near future as he is going to retire at the end of this month. She denies chest pain. She took Lasix this morning. She needs to get a few more pieces for her CPAP machine which he plans on doing.   Soc: Married. 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 Her 30 year old son who had recently graduated college passed away suddenly in 09/13/12 with a brain cyst Dola Factor).    Review of Systems: As per "subjective", otherwise negative.  No Known Allergies  Current Outpatient Prescriptions  Medication Sig Dispense Refill  . amLODipine (NORVASC) 5 MG tablet Take 1 tablet (5 mg total) by mouth daily. 30 tablet 6  . aspirin 81 MG tablet Take 1 tablet (81 mg total) by mouth daily. 30 tablet 1  . carvedilol (COREG) 25 MG tablet TAKE 1 TABLET BY MOUTH TWICE DAILY 180 tablet 3  . chlorthalidone (HYGROTON) 25 MG tablet Take 1 tablet (25 mg total) by mouth daily. 90 tablet 3  . furosemide (LASIX) 40  MG tablet Take 1 tablet by mouth daily as needed.  5  . levothyroxine (SYNTHROID, LEVOTHROID) 200 MCG tablet TAKE (1) TABLET DAILY FOR THYROID. 90 tablet 3  . losartan (COZAAR) 100 MG tablet TAKE (1) TABLET BY MOUTH DAILY FOR HIGH BLOOD PRESSURE. 90 tablet 3  . Multiple Vitamins-Minerals (CENTRUM SILVER PO) Take 1 tablet by mouth daily.    . potassium chloride SA (K-DUR,KLOR-CON) 20 MEQ tablet Take 20 mEq by mouth daily as needed. Prn only when pt takes lasix    . ergocalciferol (VITAMIN D2) 50000 UNITS capsule Take 1 capsule (50,000 Units total) by mouth once a week. (Patient not taking: Reported on 07/03/2014) 12 capsule 0   No current facility-administered medications for this visit.    Past Medical History  Diagnosis Date  . Hyperlipidemia   . HTN (hypertension)   . DM (diabetes mellitus)     Type II controlled  . Enlarged heart   . OSA on CPAP   . Lymphocytosis   . Nephrolithiasis   . Adrenal mass, left   . Grave's disease     Past Surgical History  Procedure Laterality Date  . Lithotripsy    . Thyroid ablation  1986  . S/p hysterectomy  10/1996    with bilat SOO seconardt to fibroids  . Hernia repair  Sep 13, 2009    History   Social History  . Marital Status: Legally Separated    Spouse Name: N/A  . Number of Children: N/A  . Years of Education: N/A   Occupational History  . Not on file.  Social History Main Topics  . Smoking status: Former Smoker -- 0.75 packs/day for 10 years    Types: Cigarettes    Start date: 01/30/1969    Quit date: 05/23/1988  . Smokeless tobacco: Never Used  . Alcohol Use: No  . Drug Use: No  . Sexual Activity: Yes   Other Topics Concern  . Not on file   Social History Narrative     Filed Vitals:   07/03/14 0905  Height: 5\' 3"  (1.6 m)  Weight: 269 lb (122.018 kg)   BP 180/97  Pulse 65  PHYSICAL EXAM General: NAD, obese HEENT: Normal. Neck: No JVD, no thyromegaly. Lungs: Clear to auscultation bilaterally with normal  respiratory effort. CV: Nondisplaced PMI. Regular rate and rhythm, normal S1/S2, no S3/S4, no murmur. Trace pretibial edema. No carotid bruit. Abdomen: Soft, obese.  Neurologic: Alert and oriented x 3.  Psych: Normal affect. Skin: Normal. Musculoskeletal: Normal range of motion, no gross deformities. Extremities: No clubbing or cyanosis.   ECG: Most recent ECG reviewed.    ASSESSMENT AND PLAN: 1. Malignant hypertension with hypertensive heart disease: Admits to medication compliance. Not using CPAP yet. BP remains markedly elevated today. Will increase amlodipine to 10 mg daily and continue carvedilol 25 mg twice daily, chlorthalidone 25 mg daily and losartan 100 mg daily. I again discussed the importance of treating sleep apnea as well as exercise.  2. Sleep apnea: The importance of exercise, weight loss, and CPAP use was again emphasized.  3. Morbid obesity: Exercise counseling provided.  Dispo: f/u 6 months.   Kate Sable, M.D., F.A.C.C.

## 2014-07-03 NOTE — Patient Instructions (Signed)
Your physician wants you to follow-up in: 6 months with Dr. Virgina Jock will receive a reminder letter in the mail two months in advance. If you don't receive a letter, please call our office to schedule the follow-up appointment.  Your physician has recommended you make the following change in your medication:   INCREASE AMLODIPINE 10 MG DAILY  CONTINUE ALL OTHER MEDICATIONS AS DIRECTED  Thank you for choosing [facility]!!

## 2014-09-15 ENCOUNTER — Other Ambulatory Visit: Payer: Self-pay | Admitting: Family Medicine

## 2014-09-15 DIAGNOSIS — Z1231 Encounter for screening mammogram for malignant neoplasm of breast: Secondary | ICD-10-CM

## 2014-10-03 ENCOUNTER — Ambulatory Visit (HOSPITAL_COMMUNITY)
Admission: RE | Admit: 2014-10-03 | Discharge: 2014-10-03 | Disposition: A | Discharge status: Home / Self Care | Payer: BLUE CROSS/BLUE SHIELD | Source: Ambulatory Visit | Attending: Family Medicine | Admitting: Family Medicine

## 2014-10-03 DIAGNOSIS — Z1231 Encounter for screening mammogram for malignant neoplasm of breast: Secondary | ICD-10-CM

## 2014-10-06 ENCOUNTER — Ambulatory Visit: Payer: BLUE CROSS/BLUE SHIELD | Admitting: Cardiovascular Disease

## 2014-10-06 ENCOUNTER — Encounter (HOSPITAL_COMMUNITY): Payer: Self-pay | Admitting: *Deleted

## 2014-10-06 ENCOUNTER — Emergency Department (HOSPITAL_COMMUNITY)
Admission: EM | Admit: 2014-10-06 | Discharge: 2014-10-07 | Disposition: A | Discharge status: Home / Self Care | Payer: BLUE CROSS/BLUE SHIELD | Attending: Emergency Medicine | Admitting: Emergency Medicine

## 2014-10-06 ENCOUNTER — Telehealth: Payer: Self-pay | Admitting: *Deleted

## 2014-10-06 DIAGNOSIS — Z9981 Dependence on supplemental oxygen: Secondary | ICD-10-CM | POA: Diagnosis not present

## 2014-10-06 DIAGNOSIS — G4733 Obstructive sleep apnea (adult) (pediatric): Secondary | ICD-10-CM | POA: Diagnosis not present

## 2014-10-06 DIAGNOSIS — Z79899 Other long term (current) drug therapy: Secondary | ICD-10-CM | POA: Insufficient documentation

## 2014-10-06 DIAGNOSIS — R197 Diarrhea, unspecified: Secondary | ICD-10-CM | POA: Diagnosis not present

## 2014-10-06 DIAGNOSIS — R42 Dizziness and giddiness: Secondary | ICD-10-CM | POA: Diagnosis not present

## 2014-10-06 DIAGNOSIS — Z7982 Long term (current) use of aspirin: Secondary | ICD-10-CM | POA: Insufficient documentation

## 2014-10-06 DIAGNOSIS — Z862 Personal history of diseases of the blood and blood-forming organs and certain disorders involving the immune mechanism: Secondary | ICD-10-CM | POA: Insufficient documentation

## 2014-10-06 DIAGNOSIS — I10 Essential (primary) hypertension: Secondary | ICD-10-CM

## 2014-10-06 DIAGNOSIS — Z87442 Personal history of urinary calculi: Secondary | ICD-10-CM | POA: Insufficient documentation

## 2014-10-06 DIAGNOSIS — Z87891 Personal history of nicotine dependence: Secondary | ICD-10-CM | POA: Diagnosis not present

## 2014-10-06 DIAGNOSIS — R112 Nausea with vomiting, unspecified: Secondary | ICD-10-CM | POA: Diagnosis present

## 2014-10-06 DIAGNOSIS — E119 Type 2 diabetes mellitus without complications: Secondary | ICD-10-CM | POA: Diagnosis not present

## 2014-10-06 LAB — I-STAT CHEM 8, ED
BUN: 19 mg/dL (ref 6–20)
Calcium, Ion: 1.11 mmol/L — ABNORMAL LOW (ref 1.13–1.30)
Chloride: 98 mmol/L — ABNORMAL LOW (ref 101–111)
Creatinine, Ser: 0.7 mg/dL (ref 0.44–1.00)
Glucose, Bld: 106 mg/dL — ABNORMAL HIGH (ref 65–99)
HCT: 46 % (ref 36.0–46.0)
Hemoglobin: 15.6 g/dL — ABNORMAL HIGH (ref 12.0–15.0)
Potassium: 3.5 mmol/L (ref 3.5–5.1)
Sodium: 140 mmol/L (ref 135–145)
TCO2: 30 mmol/L (ref 0–100)

## 2014-10-06 MED ORDER — CLONIDINE HCL 0.2 MG PO TABS
0.3000 mg | ORAL_TABLET | Freq: Once | ORAL | Status: AC
  Administered 2014-10-06: 0.3 mg via ORAL
  Filled 2014-10-06 (×2): qty 1

## 2014-10-06 MED ORDER — ONDANSETRON HCL 4 MG/2ML IJ SOLN
4.0000 mg | Freq: Once | INTRAMUSCULAR | Status: AC
  Administered 2014-10-06: 4 mg via INTRAVENOUS
  Filled 2014-10-06: qty 2

## 2014-10-06 NOTE — ED Provider Notes (Signed)
CSN: [id number]     Arrival date & time 10/06/14  1554 History   This chart was scribed for Leonard Schwartz, MD by Pricilla Loveless, ED Scribe. This patient was seen in room APA12/APA12 and the patient's care was started at 9:39 PM.     Chief Complaint  Patient presents with  . Emesis   The history is provided by the patient. No language interpreter was used.  \HPI Comments: Frances Hughes is a 59 y.o. female with Hx of HTN who presents to the Emergency Department complaining of intermittent, moderate vomiting that started this morning. The patient lists nausea, light-headedness and diarrhea as associated symptoms. Pt reports that she took all of her medicine at the same time while at work today, which is abnormal for her. She was not able to keep down any of her pills. Pt was seen by [facility]TA and was referred to the ED for elevated HTN. She was given and tolerated Carvedilol at [facility]. Pt denies CP and HA as associated symptoms.  Past Medical History  Diagnosis Date  . Hyperlipidemia   . HTN (hypertension)   . DM (diabetes mellitus)     Type II controlled  . Enlarged heart   . OSA on CPAP   . Lymphocytosis   . Nephrolithiasis   . Adrenal mass, left   . Grave's disease    Past Surgical History  Procedure Laterality Date  . Lithotripsy    . Thyroid ablation  1986  . S/p hysterectomy  10/1996    with bilat SOO seconardt to fibroids  . Hernia repair  2011  . Abdominal hysterectomy     Family History  Problem Relation Age of Onset  . Colon cancer Mother   . Colon cancer Sister   . Liver cancer Sister   . Cancer Sister     Colon cancer   History  Substance Use Topics  . Smoking status: Former Smoker -- 0.75 packs/day for 10 years    Types: Cigarettes    Start date: 01/30/1969    Quit date: 05/23/1988  . Smokeless tobacco: Never Used  . Alcohol Use: No   OB History    No data available     Review of Systems  Cardiovascular: Negative for chest pain.   Gastrointestinal: Positive for nausea, vomiting and diarrhea.  Neurological: Positive for light-headedness. Negative for headaches.  All other systems reviewed and are negative.  Allergies  Review of patient's allergies indicates no known allergies.  Home Medications   Prior to Admission medications   Medication Sig Start Date End Date Taking? Authorizing Provider  amLODipine (NORVASC) 10 MG tablet Take 1 tablet (10 mg total) by mouth daily. 07/03/14  Yes Herminio Commons, MD  aspirin 81 MG tablet Take 1 tablet (81 mg total) by mouth daily. 11/22/12  Yes Alycia Rossetti, MD  carvedilol (COREG) 25 MG tablet TAKE 1 TABLET BY MOUTH TWICE DAILY Patient taking differently: Take 25 mg by mouth 2 (two) times daily with a meal.  12/06/13  Yes Alycia Rossetti, MD  furosemide (LASIX) 40 MG tablet Take 1 tablet by mouth daily as needed for fluid.  07/01/14  Yes Historical Provider, MD  levothyroxine (SYNTHROID, LEVOTHROID) 200 MCG tablet TAKE (1) TABLET DAILY FOR THYROID. Patient taking differently: Take 200 mcg by mouth daily before breakfast.  12/06/13  Yes Alycia Rossetti, MD  losartan (COZAAR) 100 MG tablet TAKE (1) TABLET BY MOUTH DAILY FOR HIGH BLOOD PRESSURE. Patient taking differently: Take  100 mg by mouth daily.  12/06/13  Yes Alycia Rossetti, MD  Multiple Vitamins-Minerals (CENTRUM SILVER PO) Take 1 tablet by mouth daily.   Yes Historical Provider, MD  omeprazole (PRILOSEC) 20 MG capsule Take 20 mg by mouth 2 (two) times daily as needed. *To take 30 minutes prior to meals   Yes Historical Provider, MD  potassium chloride SA (K-DUR,KLOR-CON) 20 MEQ tablet Take 20 mEq by mouth daily as needed. Prn only when pt takes lasix 12/06/13  Yes Alycia Rossetti, MD  Blood Glucose Monitoring Suppl (BLOOD GLUCOSE MONITOR SYSTEM) W/DEVICE KIT Please dispense based on patient and insurance preference. Use to monitor fasting blood sugar 1x daily. Dx: E11.9 10/09/14   Alycia Rossetti, MD  chlorthalidone  (HYGROTON) 25 MG tablet Take 1 tablet (25 mg total) by mouth daily. 12/06/13   Alycia Rossetti, MD  ergocalciferol (VITAMIN D2) 50000 UNITS capsule Take 1 capsule (50,000 Units total) by mouth once a week. 12/12/13   Alycia Rossetti, MD  escitalopram (LEXAPRO) 10 MG tablet Take 1 tablet (10 mg total) by mouth at bedtime. 10/07/14   Alycia Rossetti, MD  Glucose Blood (BLOOD GLUCOSE TEST STRIPS) STRP Please dispense based on patient and insurance preference. Use to monitor fasting blood sugar 1x daily. Dx: E11.9 10/09/14   Alycia Rossetti, MD  Lancets 28G MISC Please dispense based on patient and insurance preference. Use to monitor fasting blood sugar 1x daily. Dx: E11.9 10/09/14   Alycia Rossetti, MD  metFORMIN (GLUCOPHAGE) 500 MG tablet Take 1 tablet (500 mg total) by mouth daily with breakfast. 10/09/14   Alycia Rossetti, MD   BP 183/99 mmHg  Pulse 64  Temp(Src) 98 F (36.7 C) (Oral)  Resp 18  Ht _0  (1.626 m)  Wt 253 lb (114.76 kg)  BMI 43.41 kg/m2  SpO2 93% Physical Exam  Constitutional: She is oriented to person, place, and time. She appears well-developed and well-nourished. No distress.  HENT:  Head: Normocephalic and atraumatic.  Eyes: Pupils are equal, round, and reactive to light.  Neck: Normal range of motion.  Cardiovascular: Normal rate and intact distal pulses.   Pulmonary/Chest: No respiratory distress.  Abdominal: Soft. Normal appearance and bowel sounds are normal. She exhibits no distension and no mass. There is no tenderness. There is no rebound and no guarding.  Musculoskeletal: Normal range of motion.  Neurological: She is alert and oriented to person, place, and time. No cranial nerve deficit.  Skin: Skin is warm and dry. No rash noted.  Psychiatric: She has a normal mood and affect. Her behavior is normal.  Nursing note and vitals reviewed.   ED Course  Procedures (including critical care time)  Medications  ondansetron (ZOFRAN) injection 4 mg (4 mg  Intravenous Given 10/06/14 2203)  cloNIDine (CATAPRES) tablet 0.3 mg (0.3 mg Oral Given 10/06/14 2203)   After treatment in the ED the patient feels back to baseline and wants to go home. DIAGNOSTIC STUDIES: Oxygen Saturation is 93% on RA, low by my interpretation.    COORDINATION OF CARE: 9:46 PM Discussed treatment plan at bedside. Pt agreed to plan.    Labs Review Labs Reviewed  I-STAT CHEM 8, ED - Abnormal; Notable for the following:    Chloride 98 (*)    Glucose, Bld 106 (*)    Calcium, Ion 1.11 (*)    Hemoglobin 15.6 (*)    All other components within normal limits    Imaging Review No results found.  EKG Interpretation None      MDM   Final diagnoses:  Essential hypertension    I personally performed the services described in this documentation, which was scribed in my presence. The recorded information has been reviewed and considered.t}    Leonard Schwartz, MD 10/10/14 [phone]

## 2014-10-06 NOTE — ED Notes (Signed)
MD Beaton at bedside. 

## 2014-10-06 NOTE — ED Notes (Signed)
Onset this am, nausea, weak  Went to her MD and bp was up, Told to go to ED  No pain

## 2014-10-06 NOTE — Discharge Instructions (Signed)
DASH Eating Plan DASH stands for "Dietary Approaches to Stop Hypertension." The DASH eating plan is a healthy eating plan that has been shown to reduce high blood pressure (hypertension). Additional health benefits may include reducing the risk of type 2 diabetes mellitus, heart disease, and stroke. The DASH eating plan may also help with weight loss. WHAT DO I NEED TO KNOW ABOUT THE DASH EATING PLAN? For the DASH eating plan, you will follow these general guidelines:  Choose foods with a percent daily value for sodium of less than 5% (as listed on the food label).  Use salt-free seasonings or herbs instead of table salt or sea salt.  Check with your health care provider or pharmacist before using salt substitutes.  Eat lower-sodium products, often labeled as "lower sodium" or "no salt added."  Eat fresh foods.  Eat more vegetables, fruits, and low-fat dairy products.  Choose whole grains. Look for the word "whole" as the first word in the ingredient list.  Choose fish and skinless chicken or turkey more often than red meat. Limit fish, poultry, and meat to 6 oz (170 g) each day.  Limit sweets, desserts, sugars, and sugary drinks.  Choose heart-healthy fats.  Limit cheese to 1 oz (28 g) per day.  Eat more home-cooked food and less restaurant, buffet, and fast food.  Limit fried foods.  Cook foods using methods other than frying.  Limit canned vegetables. If you do use them, rinse them well to decrease the sodium.  When eating at a restaurant, ask that your food be prepared with less salt, or no salt if possible. WHAT FOODS CAN I EAT? Seek help from a dietitian for individual calorie needs. Grains Whole grain or whole wheat bread. Brown rice. Whole grain or whole wheat pasta. Quinoa, bulgur, and whole grain cereals. Low-sodium cereals. Corn or whole wheat flour tortillas. Whole grain cornbread. Whole grain crackers. Low-sodium crackers. Vegetables Fresh or frozen vegetables  (raw, steamed, roasted, or grilled). Low-sodium or reduced-sodium tomato and vegetable juices. Low-sodium or reduced-sodium tomato sauce and paste. Low-sodium or reduced-sodium canned vegetables.  Fruits All fresh, canned (in natural juice), or frozen fruits. Meat and Other Protein Products Ground beef (85% or leaner), grass-fed beef, or beef trimmed of fat. Skinless chicken or turkey. Ground chicken or turkey. Pork trimmed of fat. All fish and seafood. Eggs. Dried beans, peas, or lentils. Unsalted nuts and seeds. Unsalted canned beans. Dairy Low-fat dairy products, such as skim or 1% milk, 2% or reduced-fat cheeses, low-fat ricotta or cottage cheese, or plain low-fat yogurt. Low-sodium or reduced-sodium cheeses. Fats and Oils Tub margarines without trans fats. Light or reduced-fat mayonnaise and salad dressings (reduced sodium). Avocado. Safflower, olive, or canola oils. Natural peanut or almond butter. Other Unsalted popcorn and pretzels. The items listed above may not be a complete list of recommended foods or beverages. Contact your dietitian for more options. WHAT FOODS ARE NOT RECOMMENDED? Grains White bread. White pasta. White rice. Refined cornbread. Bagels and croissants. Crackers that contain trans fat. Vegetables Creamed or fried vegetables. Vegetables in a cheese sauce. Regular canned vegetables. Regular canned tomato sauce and paste. Regular tomato and vegetable juices. Fruits Dried fruits. Canned fruit in light or heavy syrup. Fruit juice. Meat and Other Protein Products Fatty cuts of meat. Ribs, chicken wings, bacon, sausage, bologna, salami, chitterlings, fatback, hot dogs, bratwurst, and packaged luncheon meats. Salted nuts and seeds. Canned beans with salt. Dairy Whole or 2% milk, cream, half-and-half, and cream cheese. Whole-fat or sweetened yogurt. Full-fat   cheeses or blue cheese. Nondairy creamers and whipped toppings. Processed cheese, cheese spreads, or cheese  curds. Condiments Onion and garlic salt, seasoned salt, table salt, and sea salt. Canned and packaged gravies. Worcestershire sauce. Tartar sauce. Barbecue sauce. Teriyaki sauce. Soy sauce, including reduced sodium. Steak sauce. Fish sauce. Oyster sauce. Cocktail sauce. Horseradish. Ketchup and mustard. Meat flavorings and tenderizers. Bouillon cubes. Hot sauce. Tabasco sauce. Marinades. Taco seasonings. Relishes. Fats and Oils Butter, stick margarine, lard, shortening, ghee, and bacon fat. Coconut, palm kernel, or palm oils. Regular salad dressings. Other Pickles and olives. Salted popcorn and pretzels. The items listed above may not be a complete list of foods and beverages to avoid. Contact your dietitian for more information. WHERE CAN I FIND MORE INFORMATION? National Heart, Lung, and Blood Institute: www.nhlbi.nih.gov/health/health-topics/topics/dash/ Document Released: 04/28/2011 Document Revised: 09/23/2013 Document Reviewed: 03/13/2013 ExitCare Patient Information 2015 [employer]. This information is not intended to replace advice given to you by your health care provider. Make sure you discuss any questions you have with your health care provider. Hypertension Hypertension, commonly called high blood pressure, is when the force of blood pumping through your arteries is too strong. Your arteries are the blood vessels that carry blood from your heart throughout your body. A blood pressure reading consists of a higher number over a lower number, such as 110/72. The higher number (systolic) is the pressure inside your arteries when your heart pumps. The lower number (diastolic) is the pressure inside your arteries when your heart relaxes. Ideally you want your blood pressure below 120/80. Hypertension forces your heart to work harder to pump blood. Your arteries may become narrow or stiff. Having hypertension puts you at risk for heart disease, stroke, and other problems.  RISK  FACTORS Some risk factors for high blood pressure are controllable. Others are not.  Risk factors you cannot control include:   Race. You may be at higher risk if you are African American.  Age. Risk increases with age.  Gender. Men are at higher risk than women before age 45 years. After age 65, women are at higher risk than men. Risk factors you can control include:  Not getting enough exercise or physical activity.  Being overweight.  Getting too much fat, sugar, calories, or salt in your diet.  Drinking too much alcohol. SIGNS AND SYMPTOMS Hypertension does not usually cause signs or symptoms. Extremely high blood pressure (hypertensive crisis) may cause headache, anxiety, shortness of breath, and nosebleed. DIAGNOSIS  To check if you have hypertension, your health care provider will measure your blood pressure while you are seated, with your arm held at the level of your heart. It should be measured at least twice using the same arm. Certain conditions can cause a difference in blood pressure between your right and left arms. A blood pressure reading that is higher than normal on one occasion does not mean that you need treatment. If one blood pressure reading is high, ask your health care provider about having it checked again. TREATMENT  Treating high blood pressure includes making lifestyle changes and possibly taking medicine. Living a healthy lifestyle can help lower high blood pressure. You may need to change some of your habits. Lifestyle changes may include:  Following the DASH diet. This diet is high in fruits, vegetables, and whole grains. It is low in salt, red meat, and added sugars.  Getting at least 2 hours of brisk physical activity every week.  Losing weight if necessary.  Not smoking.  Limiting   alcoholic beverages.  Learning ways to reduce stress. If lifestyle changes are not enough to get your blood pressure under control, your health care provider may  prescribe medicine. You may need to take more than one. Work closely with your health care provider to understand the risks and benefits. HOME CARE INSTRUCTIONS  Have your blood pressure rechecked as directed by your health care provider.   Take medicines only as directed by your health care provider. Follow the directions carefully. Blood pressure medicines must be taken as prescribed. The medicine does not work as well when you skip doses. Skipping doses also puts you at risk for problems.   Do not smoke.   Monitor your blood pressure at home as directed by your health care provider. SEEK MEDICAL CARE IF:   You think you are having a reaction to medicines taken.  You have recurrent headaches or feel dizzy.  You have swelling in your ankles.  You have trouble with your vision. SEEK IMMEDIATE MEDICAL CARE IF:  You develop a severe headache or confusion.  You have unusual weakness, numbness, or feel faint.  You have severe chest or abdominal pain.  You vomit repeatedly.  You have trouble breathing. MAKE SURE YOU:   Understand these instructions.  Will watch your condition.  Will get help right away if you are not doing well or get worse. Document Released: 05/09/2005 Document Revised: 09/23/2013 Document Reviewed: 03/01/2013 ExitCare Patient Information 2015 [employer]. This information is not intended to replace advice given to you by your health care provider. Make sure you discuss any questions you have with your health care provider.  

## 2014-10-07 ENCOUNTER — Encounter: Payer: Self-pay | Admitting: Family Medicine

## 2014-10-07 ENCOUNTER — Ambulatory Visit (INDEPENDENT_AMBULATORY_CARE_PROVIDER_SITE_OTHER): Payer: BLUE CROSS/BLUE SHIELD | Admitting: Family Medicine

## 2014-10-07 VITALS — BP 128/80 | HR 72 | Temp 98.1°F | Resp 16 | Ht 63.0 in | Wt 276.0 lb

## 2014-10-07 DIAGNOSIS — E038 Other specified hypothyroidism: Secondary | ICD-10-CM | POA: Diagnosis not present

## 2014-10-07 DIAGNOSIS — R600 Localized edema: Secondary | ICD-10-CM | POA: Diagnosis not present

## 2014-10-07 DIAGNOSIS — G4733 Obstructive sleep apnea (adult) (pediatric): Secondary | ICD-10-CM

## 2014-10-07 DIAGNOSIS — E669 Obesity, unspecified: Secondary | ICD-10-CM | POA: Diagnosis not present

## 2014-10-07 DIAGNOSIS — F329 Major depressive disorder, single episode, unspecified: Secondary | ICD-10-CM

## 2014-10-07 DIAGNOSIS — R0789 Other chest pain: Secondary | ICD-10-CM

## 2014-10-07 DIAGNOSIS — E279 Disorder of adrenal gland, unspecified: Secondary | ICD-10-CM

## 2014-10-07 DIAGNOSIS — E1149 Type 2 diabetes mellitus with other diabetic neurological complication: Secondary | ICD-10-CM

## 2014-10-07 DIAGNOSIS — E278 Other specified disorders of adrenal gland: Secondary | ICD-10-CM

## 2014-10-07 DIAGNOSIS — I10 Essential (primary) hypertension: Secondary | ICD-10-CM

## 2014-10-07 DIAGNOSIS — F32A Depression, unspecified: Secondary | ICD-10-CM

## 2014-10-07 LAB — HEMOGLOBIN A1C
Hgb A1c MFr Bld: 6.4 % — ABNORMAL HIGH (ref <5.7)
MEAN PLASMA GLUCOSE: 137 mg/dL — AB (ref <117)

## 2014-10-07 LAB — CBC WITH DIFFERENTIAL/PLATELET
BASOS ABS: 0 10*3/uL (ref 0.0–0.1)
Basophils Relative: 0 % (ref 0–1)
Eosinophils Absolute: 0.2 10*3/uL (ref 0.0–0.7)
Eosinophils Relative: 2 % (ref 0–5)
HCT: 38.6 % (ref 36.0–46.0)
Hemoglobin: 12.5 g/dL (ref 12.0–15.0)
Lymphocytes Relative: 35 % (ref 12–46)
Lymphs Abs: 2.8 10*3/uL (ref 0.7–4.0)
MCH: 25.1 pg — AB (ref 26.0–34.0)
MCHC: 32.4 g/dL (ref 30.0–36.0)
MCV: 77.5 fL — ABNORMAL LOW (ref 78.0–100.0)
MPV: 9.6 fL (ref 8.6–12.4)
Monocytes Absolute: 0.4 10*3/uL (ref 0.1–1.0)
Monocytes Relative: 5 % (ref 3–12)
Neutro Abs: 4.7 10*3/uL (ref 1.7–7.7)
Neutrophils Relative %: 58 % (ref 43–77)
PLATELETS: 271 10*3/uL (ref 150–400)
RBC: 4.98 MIL/uL (ref 3.87–5.11)
RDW: 14.9 % (ref 11.5–15.5)
WBC: 8.1 10*3/uL (ref 4.0–10.5)

## 2014-10-07 LAB — TSH: TSH: 3.774 u[IU]/mL (ref 0.350–4.500)

## 2014-10-07 LAB — T4, FREE: Free T4: 0.95 ng/dL (ref 0.80–1.80)

## 2014-10-07 LAB — T3, FREE: T3 FREE: 1.9 pg/mL — AB (ref 2.3–4.2)

## 2014-10-07 MED ORDER — ESCITALOPRAM OXALATE 10 MG PO TABS: 10.0000 mg | ORAL_TABLET | Freq: Every day | ORAL | Status: DC

## 2014-10-07 NOTE — Progress Notes (Signed)
Patient ID: SHANESHA Hughes, female   DOB: 05-17-1956, 59 y.o.   MRN: [id number]   Subjective:    [name], female    DOB: 1956/05/23, 59 y.o.   MRN: [id number]  Patient presents for ED F/U  Patient here to follow-up the emergency room for severely elevated blood pressure. She was last seen in my office in July 2015 at that time we were treating hypertension which I refer her to cardiology for diabetes mellitus obesity peripheral edema hyperlipidemia. She is being followed by cardiology and states that she has been compliant with all for medications with the exception of diuretic which she is not taking on a regular basis. For the past 2 months she's had episodes of fatigue dizziness healing of fleshiness she also gets short of breath with minimal exertion. She just does not feel well. She is still grieving from the loss of her son and is not dealing with this very well. She does not sleep well at all and often sleeps in her living room on the love seat. Her husband is accompanying here today and agrees with all the above. Yesterday at work she had one of her episodes where she began to feel flushed and dizzy and lightheaded she then became nauseous and vomited her blood pressure was severely elevated she went to the nearby [facility] family practice they gave her a dose of blood pressure medication but it did not help therefore they called her cardiologist she went over to the cardiologist's office but her blood pressure was in the 188C systolic therefore they sent her to the emergency room for treatment. She had a chem 8 done in the ER she was given clonidine 0.3 mg. On review of her medication she has been taking everything with the exception of chlorthalidone   Review Of Systems:  GEN- + fatigue, fever, weight loss,weakness, recent illness HEENT- denies eye drainage, change in vision, nasal discharge, CVS- denies chest pain, palpitations RESP- + SOB, cough, wheeze ABD- denies  N/V, change in stools, abd pain GU- denies dysuria, hematuria, dribbling, incontinence MSK- denies joint pain, muscle aches, injury Neuro- denies headache,+ dizziness, syncope, seizure activity       Objective:    BP 128/80 mmHg  Pulse 72  Temp(Src) 98.1 F (36.7 C) (Oral)  Resp 16  Ht 5\' 3"  (1.6 m)  Wt 276 lb (125.193 kg)  BMI 48.90 kg/m2 GEN- NAD, alert and oriented x3,obese HEENT- PERRL, EOMI, non injected sclera, pink conjunctiva, MMM, oropharynx clear Neck- Supple, no thyromegaly, no JVD CVS- RRR, no murmur RESP-CTAB ABD-NABS,soft,NT,ND Psych-depressed and tearful affect, not anxious, no SI, well groomed, good Eye contact EXT- 1+ pitting edema Pulses- Radial, DP- 2+  EKG- sinus bradycardia, non specific t waves- compared to EKG 2015      Assessment & Plan:      Problem List Items Addressed This Visit    Obstructive sleep apnea   Obesity   Leg edema   Hypothyroidism   Relevant Orders   TSH   T3, free   T4, free   Essential hypertension   Diabetes mellitus, type II   Relevant Orders   CBC with Differential/Platelet   Comprehensive metabolic panel   Hemoglobin A1c   Depression (emotion)   Relevant Medications   escitalopram (LEXAPRO) 10 MG tablet   Adrenal mass, left    Other Visit Diagnoses    Other chest pain    -  Primary    Relevant Orders  EKG 12-Lead (Completed)       Note: This dictation was prepared with Dragon dictation along with smaller phrase technology. Any transcriptional errors that result from this process are unintentional.

## 2014-10-07 NOTE — Patient Instructions (Signed)
Start lexapro at bedtime for mood and sleep Restart chlorthalidone, which is a milder diuretic Compresson hose- [facility] CPAP Machine  We will call with lab results Imaging to be done on Adrenal gland- afternnon F/U 4 weeks

## 2014-10-07 NOTE — Telephone Encounter (Signed)
Several calls were made to patient to inform her that per Dr. Raliegh Ip, she needed to go to the ED for an evaluation but was unable to reach patient. Patient was informed yesterday after arriving to the office and contact information was updated.

## 2014-10-08 LAB — COMPREHENSIVE METABOLIC PANEL
ALT: 18 U/L (ref 0–35)
AST: 16 U/L (ref 0–37)
Albumin: 3.5 g/dL (ref 3.5–5.2)
Alkaline Phosphatase: 86 U/L (ref 39–117)
BUN: 19 mg/dL (ref 6–23)
CALCIUM: 8.8 mg/dL (ref 8.4–10.5)
CHLORIDE: 96 meq/L (ref 96–112)
CO2: 29 mEq/L (ref 19–32)
Creat: 0.76 mg/dL (ref 0.50–1.10)
GLUCOSE: 97 mg/dL (ref 70–99)
Potassium: 3.7 mEq/L (ref 3.5–5.3)
Sodium: 139 mEq/L (ref 135–145)
Total Bilirubin: 0.4 mg/dL (ref 0.2–1.2)
Total Protein: 7 g/dL (ref 6.0–8.3)

## 2014-10-08 NOTE — Assessment & Plan Note (Signed)
Daily duiretic , compression hose ordered, pt agrees to wear

## 2014-10-08 NOTE — Assessment & Plan Note (Signed)
Plan for repeat CT scan of adrenal glands

## 2014-10-08 NOTE — Assessment & Plan Note (Signed)
BP improved today, will put her back on chlorthalidone, she has not been taking, labs done today including TFT Discussed importance of following up with our office and heart doctor

## 2014-10-08 NOTE — Assessment & Plan Note (Addendum)
Trial of lexapro 10mg  at bedtime, needs therapy and support group to help with grief , continue to push this I think a lot of her above symptoms are MTF from her depression, to morbid obesity, insomnia

## 2014-10-08 NOTE — Addendum Note (Signed)
Addended by: Vic Blackbird F on: 10/08/2014 01:03 PM   Modules accepted: Orders

## 2014-10-08 NOTE — Assessment & Plan Note (Signed)
New script sent to [facility] for CPAP equipment, she would benefit from treatment

## 2014-10-09 ENCOUNTER — Other Ambulatory Visit: Payer: Self-pay | Admitting: *Deleted

## 2014-10-09 MED ORDER — LANCETS 28G MISC: Status: AC

## 2014-10-09 MED ORDER — METFORMIN HCL 500 MG PO TABS: 500.0000 mg | ORAL_TABLET | Freq: Every day | ORAL | Status: DC

## 2014-10-09 MED ORDER — BLOOD GLUCOSE MONITOR SYSTEM W/DEVICE KIT: PACK | Status: AC

## 2014-10-09 MED ORDER — BLOOD GLUCOSE TEST VI STRP: ORAL_STRIP | Status: DC

## 2014-10-10 ENCOUNTER — Ambulatory Visit (HOSPITAL_COMMUNITY)
Admission: RE | Admit: 2014-10-10 | Discharge: 2014-10-10 | Disposition: A | Discharge status: Home / Self Care | Payer: BLUE CROSS/BLUE SHIELD | Source: Ambulatory Visit | Attending: Family Medicine | Admitting: Family Medicine

## 2014-10-10 DIAGNOSIS — N200 Calculus of kidney: Secondary | ICD-10-CM | POA: Insufficient documentation

## 2014-10-10 DIAGNOSIS — E279 Disorder of adrenal gland, unspecified: Secondary | ICD-10-CM | POA: Diagnosis present

## 2014-10-10 DIAGNOSIS — E278 Other specified disorders of adrenal gland: Secondary | ICD-10-CM

## 2014-10-10 MED ORDER — IOHEXOL 300 MG/ML  SOLN
100.0000 mL | Freq: Once | INTRAMUSCULAR | Status: AC | PRN
  Administered 2014-10-10: 100 mL via INTRAVENOUS

## 2014-11-04 ENCOUNTER — Encounter: Payer: Self-pay | Admitting: Family Medicine

## 2014-11-04 ENCOUNTER — Ambulatory Visit: Payer: BLUE CROSS/BLUE SHIELD | Admitting: Family Medicine

## 2014-11-04 ENCOUNTER — Ambulatory Visit (INDEPENDENT_AMBULATORY_CARE_PROVIDER_SITE_OTHER): Payer: BLUE CROSS/BLUE SHIELD | Admitting: Family Medicine

## 2014-11-04 VITALS — BP 132/76 | HR 84 | Temp 97.6°F | Resp 16 | Ht 63.0 in | Wt 261.0 lb

## 2014-11-04 DIAGNOSIS — F329 Major depressive disorder, single episode, unspecified: Secondary | ICD-10-CM | POA: Diagnosis not present

## 2014-11-04 DIAGNOSIS — I10 Essential (primary) hypertension: Secondary | ICD-10-CM | POA: Diagnosis not present

## 2014-11-04 DIAGNOSIS — R600 Localized edema: Secondary | ICD-10-CM | POA: Diagnosis not present

## 2014-11-04 DIAGNOSIS — E1149 Type 2 diabetes mellitus with other diabetic neurological complication: Secondary | ICD-10-CM | POA: Diagnosis not present

## 2014-11-04 DIAGNOSIS — F32A Depression, unspecified: Secondary | ICD-10-CM

## 2014-11-04 NOTE — Patient Instructions (Signed)
Continue current medications Try the lexapro for your mood Continue to work on weight loss, healthy eating F/U 3 months

## 2014-11-05 NOTE — Progress Notes (Signed)
[name], female   DOB: Sep 22, 1955, 59 y.o.   MRN: [id number]   Subjective:    [name], female    DOB: 04-13-1956, 59 y.o.   MRN: [id number]  Patient presents for 4 month F/U  patient here for interim follow-up from her last visit. She is taking all her medications as prescribed. She continues to suffer with grief from her son's loss she is doing some therapy with another friend who recently lost a child but she has not taken the Lexapro even though she thinks that it will help. She still finds her mind wandering a lot and she has a lot of anxiety.  Sleep apnea she is using her C Pap machine however she wants to switch to the nasal prongs because this is making her mouth very dry she thinks she may be mouth breathing some.  Diabetes mellitus she is taking the metformin as prescribed.  Leg swelling she continues to have significant edema however she does not use the Lasix she has been traveling a lot and does not like to take the Lasix but is taking the chlorthalidone. She did get fitted for her compression hose but she does not have these right now. Of note though she has lost 15 pounds since her last visit.    Review Of Systems:  GEN- denies fatigue, fever, weight loss,weakness, recent illness HEENT- denies eye drainage, change in vision, nasal discharge, CVS- denies chest pain, palpitations RESP- denies SOB, cough, wheeze ABD- denies N/V, change in stools, abd pain GU- denies dysuria, hematuria, dribbling, incontinence MSK- denies joint pain, muscle aches, injury Neuro- denies headache, dizziness, syncope, seizure activity       Objective:    BP 132/76 mmHg  Pulse 84  Temp(Src) 97.6 F (36.4 C) (Oral)  Resp 16  Ht 5\' 3"  (1.6 m)  Wt 261 lb (118.389 kg)  BMI 46.25 kg/m2 GEN- NAD, alert and oriented x3,obese HEENT- PERRL, EOMI, non injected sclera, pink conjunctiva, MMM, oropharynx clear CVS- RRR, no murmur RESP-CTAB Psych-depressed and  tearful affect, not anxious, no SI, well groomed, EXT- 1+edema from mid shins down (improved) Pulses- Radial, DP- 2+     Assessment & Plan:      Problem List Items Addressed This Visit    None      Note: This dictation was prepared with Dragon dictation along with smaller phrase technology. Any transcriptional errors that result from this process are unintentional.

## 2014-11-05 NOTE — Assessment & Plan Note (Signed)
Blood pressure is well-controlled today continue current medications

## 2014-11-05 NOTE — Assessment & Plan Note (Signed)
Continue metformin. Her weight loss is significant. She is trying to monitor her diet and is following more of a high protein diet. She will follow-up with her next visit and we will have repeat A1c

## 2014-11-05 NOTE — Assessment & Plan Note (Signed)
She would benefit from the Lexapro however she just does not want to start this medication. She has a good support network.

## 2014-11-05 NOTE — Assessment & Plan Note (Signed)
Hopefully she will wear the compression hose. Continue the chlorthalidone she also has Lasix but is not using this

## 2014-12-25 ENCOUNTER — Encounter: Payer: Self-pay | Admitting: Physician Assistant

## 2014-12-25 ENCOUNTER — Ambulatory Visit (INDEPENDENT_AMBULATORY_CARE_PROVIDER_SITE_OTHER): Payer: BLUE CROSS/BLUE SHIELD | Admitting: Physician Assistant

## 2014-12-25 VITALS — BP 130/82 | HR 80 | Temp 98.0°F | Resp 16 | Ht 63.0 in | Wt 239.0 lb

## 2014-12-25 DIAGNOSIS — M545 Low back pain, unspecified: Secondary | ICD-10-CM

## 2014-12-25 MED ORDER — MELOXICAM 7.5 MG PO TABS
7.5000 mg | ORAL_TABLET | Freq: Every day | ORAL | Status: DC
Start: 2014-12-25 — End: 2015-04-03

## 2014-12-25 MED ORDER — METAXALONE 800 MG PO TABS
800.0000 mg | ORAL_TABLET | Freq: Three times a day (TID) | ORAL | Status: DC
Start: 2014-12-25 — End: 2015-04-03

## 2014-12-25 NOTE — Progress Notes (Signed)
[name] MRN: [id number], DOB: 1955/09/21, 59 y.o. Date of Encounter: 12/25/2014, 12:18 PM    Chief Complaint:  Chief Complaint  Patient presents with  . Lumbar Pain    x1 day- states that she woke up with R sided lumbar pain, but now it has moved to L side- denies injury or sprain     HPI: 59 y.o. year old AA female presents for evaluation of low back pain.  She states that she has no prior history of problems with low back pain. Says that she has been having pain across her low back for the past one week. She has been taking ibuprofen and has been applying SalonPas to that area. Says that it started out on the right side and now is hurting more on the left side. Says that yesterday she felt some shooting pain down the right leg to the knee.  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 Says that her daughter had a baby and wants to come stay with her for a while while the baby is so small so patient has been working cleaning out the guest room but says that that really did not involve any heavy lifting. Has mostly just been rearranging things. Has done no other uterine unusual activity or increased activity. Says that this past Sunday after doing a lot of cooking,  and having to clean up,  it was really bothering her.     Home Meds:   Outpatient Prescriptions Prior to Visit  Medication Sig Dispense Refill  . amLODipine (NORVASC) 10 MG tablet Take 1 tablet (10 mg total) by mouth daily. 180 tablet 3  . aspirin 81 MG tablet Take 1 tablet (81 mg total) by mouth daily. 30 tablet 1  . Blood Glucose Monitoring Suppl (BLOOD GLUCOSE MONITOR SYSTEM) W/DEVICE KIT Please dispense based on patient and insurance preference. Use to monitor fasting blood sugar 1x daily. Dx: E11.9 1 each 0  . carvedilol (COREG) 25 MG tablet TAKE 1 TABLET BY MOUTH TWICE DAILY (Patient taking differently: Take 25 mg by mouth 2 (two) times daily with a meal. ) 180 tablet 3  .  chlorthalidone (HYGROTON) 25 MG tablet Take 1 tablet (25 mg total) by mouth daily. 90 tablet 3  . furosemide (LASIX) 40 MG tablet Take 1 tablet by mouth daily as needed for fluid.   5  . Glucose Blood (BLOOD GLUCOSE TEST STRIPS) STRP Please dispense based on patient and insurance preference. Use to monitor fasting blood sugar 1x daily. Dx: E11.9 100 each 0  . Lancets 28G MISC Please dispense based on patient and insurance preference. Use to monitor fasting blood sugar 1x daily. Dx: E11.9 100 each 0  . levothyroxine (SYNTHROID, LEVOTHROID) 200 MCG tablet TAKE (1) TABLET DAILY FOR THYROID. (Patient taking differently: Take 200 mcg by mouth daily before breakfast. ) 90 tablet 3  . losartan (COZAAR) 100 MG tablet TAKE (1) TABLET BY MOUTH DAILY FOR HIGH BLOOD PRESSURE. (Patient taking differently: Take 100 mg by mouth daily. ) 90 tablet 3  . metFORMIN (GLUCOPHAGE) 500 MG tablet Take 1 tablet (500 mg total) by mouth daily with breakfast. 30 tablet 3  . Multiple Vitamins-Minerals (CENTRUM SILVER PO) Take 1 tablet by mouth daily.    Marland Kitchen omeprazole (PRILOSEC) 20 MG capsule Take 20 mg by mouth 2 (two) times daily as needed. *To take 30 minutes prior to meals    . potassium chloride SA (K-DUR,KLOR-CON) 20 MEQ tablet Take  20 mEq by mouth daily as needed. Prn only when pt takes lasix     No facility-administered medications prior to visit.    Allergies: No Known Allergies    Review of Systems: See HPI for pertinent ROS. All other ROS negative.    Physical Exam: Blood pressure 130/82, pulse 80, temperature 98 F (36.7 C), temperature source Oral, resp. rate 16, height _0  (1.6 m), weight 239 lb (108.41 kg)., Body mass index is 42.35 kg/(m^2). General: Obese AAF.  Appears in no acute distress. Neck: Supple. No thyromegaly. No lymphadenopathy. Lungs: Clear bilaterally to auscultation without wheezes, rales, or rhonchi. Breathing is unlabored. Heart: Regular rhythm. No murmurs, rubs, or gallops. Msk:   Strength and tone normal for age. Discomfort is at L5 level bilaterally.  I had her perform bilateral SLR and Hip Abduction--she had difficult time even stepping up onto step. Had difficulty lying back onto exam table and coming back up to seated position. She was able to do straight leg raise and hip abduction bilaterally but said this did cause some increased discomfort in the low back. Patella reflexes are 2+/equal bilaterally. Extremities/Skin: Warm and dry. Neuro: Alert and oriented X 3. Moves all extremities spontaneously. Gait is normal. CNII-XII grossly in tact. Psych:  Responds to questions appropriately with a normal affect.     ASSESSMENT AND PLAN:  59 y.o. year old female with  1. Bilateral low back pain without sciatica Discussed applying heat to the area in the form of warm water in the shower as well as heating pad. Discussed slowly gently stretching the area. Told her throughout the workday to stand and stretch. I think that her obesity and body habitus is the main cause for her pain. Discussed going ahead and getting x-ray versus trying this therapy first and then if continues with pain getting x-ray. Patient states that she has an appointment with Dr. Buelah Manis next week. Says that she will try this medication and if continues with significant pain will have Dr. Buelah Manis order x-ray at that visit in one week. Cautioned that the Skelaxin may cause drowsiness and if it does then only take this at night.  - metaxalone (SKELAXIN) 800 MG tablet; Take 1 tablet (800 mg total) by mouth 3 (three) times daily.  Dispense: 30 tablet; Refill: 2 - meloxicam (MOBIC) 7.5 MG tablet; Take 1 tablet (7.5 mg total) by mouth daily. With food.  Dispense: 30 tablet; Refill: 0   Signed, [address] [place], [place], [facility] 12/25/2014 12:18 PM

## 2014-12-30 ENCOUNTER — Encounter: Payer: Self-pay | Admitting: Family Medicine

## 2014-12-30 ENCOUNTER — Ambulatory Visit (INDEPENDENT_AMBULATORY_CARE_PROVIDER_SITE_OTHER): Payer: BLUE CROSS/BLUE SHIELD | Admitting: Family Medicine

## 2014-12-30 VITALS — BP 128/72 | HR 74 | Temp 98.0°F | Resp 16 | Ht 63.0 in | Wt 243.0 lb

## 2014-12-30 DIAGNOSIS — F329 Major depressive disorder, single episode, unspecified: Secondary | ICD-10-CM | POA: Diagnosis not present

## 2014-12-30 DIAGNOSIS — E785 Hyperlipidemia, unspecified: Secondary | ICD-10-CM | POA: Diagnosis not present

## 2014-12-30 DIAGNOSIS — M545 Low back pain, unspecified: Secondary | ICD-10-CM | POA: Insufficient documentation

## 2014-12-30 DIAGNOSIS — Z0000 Encounter for general adult medical examination without abnormal findings: Secondary | ICD-10-CM

## 2014-12-30 DIAGNOSIS — I10 Essential (primary) hypertension: Secondary | ICD-10-CM | POA: Diagnosis not present

## 2014-12-30 DIAGNOSIS — E1149 Type 2 diabetes mellitus with other diabetic neurological complication: Secondary | ICD-10-CM | POA: Diagnosis not present

## 2014-12-30 DIAGNOSIS — Z1211 Encounter for screening for malignant neoplasm of colon: Secondary | ICD-10-CM | POA: Diagnosis not present

## 2014-12-30 DIAGNOSIS — F32A Depression, unspecified: Secondary | ICD-10-CM

## 2014-12-30 NOTE — Progress Notes (Signed)
[name], female   DOB: 1955/10/14, 59 y.o.   MRN: [id number]   Subjective:    [name], female    DOB: 09-Nov-1955, 59 y.o.   MRN: [id number]  Patient presents for Annual Exam here for annual exam. She was here for  Back pain about a week ago. She does not remember any particular injury but she still has pain across the lower part of her back feels like it is moving around. She denies any tingling or numbness in her lower extremities denies any radiating pain. She's been using a muscle relaxant which helped some as well some meloxicam. No change in bowel or bladder.    she is due for colonoscopy she has family history of colon cancer. Mammogram is up-to-date. Immunizations reviewed she is due for shingles vaccine but not sure if this is covered with her insurance.   With regards her diabetes and blood pressure she is due for fasting labs in about 2 weeks.   she does continue to state that she gets a fullness near her collarbone she feels like may be her bone structure is uneven and she has had this evaluated multiple times by ear nose and throat and nothing can be found. She had an ultrasound back in 2013 she had very little residual thyroid left. She is taking her thyroid medicine as prescribed  Review Of Systems:  GEN- denies fatigue, fever, weight loss,weakness, recent illness HEENT- denies eye drainage, change in vision, nasal discharge, CVS- denies chest pain, palpitations RESP- denies SOB, cough, wheeze ABD- denies N/V, change in stools, abd pain GU- denies dysuria, hematuria, dribbling, incontinence MSK- denies joint pain, muscle aches, injury Neuro- denies headache, dizziness, syncope, seizure activity       Objective:    BP 128/72 mmHg  Pulse 74  Temp(Src) 98 F (36.7 C) (Oral)  Resp 16  Ht 5\' 3"  (1.6 m)  Wt 243 lb (110.224 kg)  BMI 43.06 kg/m2 GEN- NAD, alert and oriented x3 HEENT- PERRL, EOMI, non injected sclera, pink  conjunctiva, MMM, oropharynx clear Neck- Supple, no thyromegaly, no LAD, no bony abnormality palpated  CVS- RRR, no murmur RESP-CTAB MSK- TTP lumbar spine and bilat parapspinals, no spasm palpated, fair ROM spine, neg SLR, Fair ROM HIPS- Difficult due to habitus ABD-NABS,soft,NT,ND EXT- 1+ non pitting edema Pulses- Radial, DP- 2+        Assessment & Plan:      Problem List Items Addressed This Visit    Routine general medical examination at a health care facility    CPE done, mammo UTD, schedule for colonoscopy, return for fasting labs, check into shingles vaccine      Lumbago    diffuclt due to habitus, will obtain xray l lpine, no red flags, likely has some DDD      Hyperlipidemia - Primary    Recheck lipids fasting       Essential hypertension    Well controlled       Diabetes mellitus, type II    Continue MTF, goal a1c <7%      Depression (emotion)      Note: This dictation was prepared with Dragon dictation along with smaller phrase technology. Any transcriptional errors that result from this process are unintentional.

## 2014-12-30 NOTE — Assessment & Plan Note (Signed)
Continue MTF, goal a1c <7%

## 2014-12-30 NOTE — Assessment & Plan Note (Signed)
diffuclt due to habitus, will obtain xray l lpine, no red flags, likely has some DDD

## 2014-12-30 NOTE — Assessment & Plan Note (Signed)
Recheck lipids fasting

## 2014-12-30 NOTE — Assessment & Plan Note (Signed)
CPE done, mammo UTD, schedule for colonoscopy, return for fasting labs, check into shingles vaccine

## 2014-12-30 NOTE — Assessment & Plan Note (Signed)
Well controlled 

## 2014-12-30 NOTE — Patient Instructions (Addendum)
Return in 2 weeks for labs Colonoscopy to be scheduled Check shingles vaccine  Continue current medications Release of records- [facility] [place] - last note  Change follow-up to November

## 2015-01-07 ENCOUNTER — Other Ambulatory Visit: Payer: Self-pay | Admitting: Family Medicine

## 2015-01-08 NOTE — Telephone Encounter (Signed)
Refill appropriate and filled per protocol. 

## 2015-02-06 ENCOUNTER — Ambulatory Visit: Payer: BLUE CROSS/BLUE SHIELD | Admitting: Family Medicine

## 2015-04-03 ENCOUNTER — Encounter: Payer: Self-pay | Admitting: Family Medicine

## 2015-04-03 ENCOUNTER — Ambulatory Visit (INDEPENDENT_AMBULATORY_CARE_PROVIDER_SITE_OTHER): Payer: BLUE CROSS/BLUE SHIELD | Admitting: Family Medicine

## 2015-04-03 VITALS — BP 130/74 | HR 78 | Temp 98.3°F | Resp 14 | Ht 63.0 in | Wt 244.0 lb

## 2015-04-03 DIAGNOSIS — E669 Obesity, unspecified: Secondary | ICD-10-CM | POA: Diagnosis not present

## 2015-04-03 DIAGNOSIS — E785 Hyperlipidemia, unspecified: Secondary | ICD-10-CM

## 2015-04-03 DIAGNOSIS — I10 Essential (primary) hypertension: Secondary | ICD-10-CM

## 2015-04-03 DIAGNOSIS — E1149 Type 2 diabetes mellitus with other diabetic neurological complication: Secondary | ICD-10-CM

## 2015-04-03 DIAGNOSIS — I519 Heart disease, unspecified: Secondary | ICD-10-CM

## 2015-04-03 DIAGNOSIS — I5189 Other ill-defined heart diseases: Secondary | ICD-10-CM

## 2015-04-03 LAB — CBC WITH DIFFERENTIAL/PLATELET
Basophils Absolute: 0 10*3/uL (ref 0.0–0.1)
Basophils Relative: 0 % (ref 0–1)
Eosinophils Absolute: 0.2 10*3/uL (ref 0.0–0.7)
Eosinophils Relative: 3 % (ref 0–5)
HCT: 40.6 % (ref 36.0–46.0)
HEMOGLOBIN: 12.8 g/dL (ref 12.0–15.0)
LYMPHS ABS: 2.6 10*3/uL (ref 0.7–4.0)
Lymphocytes Relative: 36 % (ref 12–46)
MCH: 25.3 pg — ABNORMAL LOW (ref 26.0–34.0)
MCHC: 31.5 g/dL (ref 30.0–36.0)
MCV: 80.2 fL (ref 78.0–100.0)
MONO ABS: 0.4 10*3/uL (ref 0.1–1.0)
MPV: 9.9 fL (ref 8.6–12.4)
Monocytes Relative: 5 % (ref 3–12)
NEUTROS PCT: 56 % (ref 43–77)
Neutro Abs: 4 10*3/uL (ref 1.7–7.7)
Platelets: 286 10*3/uL (ref 150–400)
RBC: 5.06 MIL/uL (ref 3.87–5.11)
RDW: 14.6 % (ref 11.5–15.5)
WBC: 7.1 10*3/uL (ref 4.0–10.5)

## 2015-04-03 LAB — COMPREHENSIVE METABOLIC PANEL
ALBUMIN: 3.7 g/dL (ref 3.6–5.1)
ALK PHOS: 94 U/L (ref 33–130)
ALT: 15 U/L (ref 6–29)
AST: 16 U/L (ref 10–35)
BILIRUBIN TOTAL: 0.4 mg/dL (ref 0.2–1.2)
BUN: 16 mg/dL (ref 7–25)
CO2: 33 mmol/L — ABNORMAL HIGH (ref 20–31)
CREATININE: 0.71 mg/dL (ref 0.50–0.99)
Calcium: 9 mg/dL (ref 8.6–10.4)
Chloride: 104 mmol/L (ref 98–110)
Glucose, Bld: 108 mg/dL — ABNORMAL HIGH (ref 70–99)
Potassium: 4 mmol/L (ref 3.5–5.3)
Sodium: 148 mmol/L — ABNORMAL HIGH (ref 135–146)
TOTAL PROTEIN: 7.4 g/dL (ref 6.1–8.1)

## 2015-04-03 LAB — LIPID PANEL
CHOLESTEROL: 174 mg/dL (ref 125–200)
HDL: 59 mg/dL (ref >=46)
LDL Cholesterol: 103 mg/dL (ref <130)
Total CHOL/HDL Ratio: 2.9 Ratio (ref <=5.0)
Triglycerides: 62 mg/dL (ref <150)
VLDL: 12 mg/dL (ref <30)

## 2015-04-03 LAB — HEMOGLOBIN A1C
HEMOGLOBIN A1C: 6.5 % — AB (ref <5.7)
MEAN PLASMA GLUCOSE: 140 mg/dL — AB (ref <117)

## 2015-04-03 NOTE — Assessment & Plan Note (Signed)
Goal LDL less than 100, no current statn, difficult getting her to be compliant with all her other meds

## 2015-04-03 NOTE — Assessment & Plan Note (Signed)
On MTF, recheck labs ,goal less than 7%, check urine micro  Flu shot done Reiterated importance of medications, dietary changes, she is morbidly obese, high risk for CVA and/or CAD

## 2015-04-03 NOTE — Patient Instructions (Signed)
We will call with lab results Continue current medications Work on the diet and walking for weight loss F/U 3 months

## 2015-04-03 NOTE — Assessment & Plan Note (Signed)
Has diurteic but avoids due to her work and having to drive,discussed importance of these, she is wearing support socks

## 2015-04-03 NOTE — Progress Notes (Signed)
[name], female   DOB: 10-03-1955, 59 y.o.   MRN: [id number]   Subjective:    [name], female    DOB: 05-Feb-1956, 59 y.o.   MRN: [id number]  Patient presents for 3 month F/U Pt here to follow chronic medical problems. She not have her fasting labs done after her annual wellness back in August. She has diabetes mellitus she's currently on metformin. She did not bring her meter with her today. She denies any hypoglycemic symptoms. She is taking her other medicines most of the time She is dealing with her husband having prostate cancer, recent biopsies are pending One of her daughters is having problems with substance abuse  Flu shot done by health department   Review Of Systems:  GEN- denies fatigue, fever, weight loss,weakness, recent illness HEENT- denies eye drainage, change in vision, nasal discharge, CVS- denies chest pain, palpitations RESP- denies SOB, cough, wheeze ABD- denies N/V, change in stools, abd pain GU- denies dysuria, hematuria, dribbling, incontinence MSK- denies joint pain, muscle aches, injury Neuro- denies headache, dizziness, syncope, seizure activity       Objective:    BP 130/74 mmHg  Pulse 78  Temp(Src) 98.3 F (36.8 C) (Oral)  Resp 14  Ht 5\' 3"  (1.6 m)  Wt 244 lb (110.678 kg)  BMI 43.23 kg/m2 GEN- NAD, alert and oriented x3, obese HEENT- PERRL, EOMI, non injected sclera, pink conjunctiva, MMM, oropharynx clear CVS- RRR, soft systolic  murmur RESP-CTAB EXT- pedal  edema Pulses- Radial 2+, DP-1+        Assessment & Plan:      Problem List Items Addressed This Visit    Obesity - Primary   Hyperlipidemia    Goal LDL less than 100, no current statn, difficult getting her to be compliant with all her other meds       Essential hypertension   Diastolic dysfunction    Has diurteic but avoids due to her work and having to drive,discussed importance of these, she is wearing support socks      Diabetes  mellitus, type II (Frances Hughes)    On MTF, recheck labs ,goal less than 7%, check urine micro  Flu shot done Reiterated importance of medications, dietary changes, she is morbidly obese, high risk for CVA and/or CAD      Relevant Orders   Microalbumin / creatinine urine ratio   HM DIABETES FOOT EXAM (Completed)    Other Visit Diagnoses    Type 2 diabetes mellitus with other diabetic neurological complication Frances Hughes)           Note: This dictation was prepared with Dragon dictation along with smaller phrase technology. Any transcriptional errors that result from this process are unintentional.

## 2015-04-04 LAB — MICROALBUMIN / CREATININE URINE RATIO
Creatinine, Urine: 40 mg/dL (ref 20–320)
MICROALB UR: 0.3 mg/dL
Microalb Creat Ratio: 8 mcg/mg creat (ref <30)

## 2015-04-07 ENCOUNTER — Other Ambulatory Visit: Payer: Self-pay | Admitting: *Deleted

## 2015-04-07 MED ORDER — CARVEDILOL 25 MG PO TABS: 25.0000 mg | ORAL_TABLET | Freq: Two times a day (BID) | ORAL | Status: DC

## 2015-04-07 MED ORDER — POTASSIUM CHLORIDE CRYS ER 20 MEQ PO TBCR: 20.0000 meq | EXTENDED_RELEASE_TABLET | Freq: Every day | ORAL | Status: DC

## 2015-04-07 MED ORDER — LEVOTHYROXINE SODIUM 200 MCG PO TABS: ORAL_TABLET | ORAL | Status: DC

## 2015-04-07 MED ORDER — AMLODIPINE BESYLATE 10 MG PO TABS: 10.0000 mg | ORAL_TABLET | Freq: Every day | ORAL | Status: DC

## 2015-04-07 MED ORDER — LOSARTAN POTASSIUM 100 MG PO TABS: ORAL_TABLET | ORAL | Status: DC

## 2015-04-07 MED ORDER — OMEPRAZOLE 20 MG PO CPDR: 20.0000 mg | DELAYED_RELEASE_CAPSULE | Freq: Two times a day (BID) | ORAL | Status: DC | PRN

## 2015-04-07 MED ORDER — CHLORTHALIDONE 25 MG PO TABS: 25.0000 mg | ORAL_TABLET | Freq: Every day | ORAL | Status: DC

## 2015-04-07 MED ORDER — FUROSEMIDE 40 MG PO TABS: ORAL_TABLET | ORAL | Status: DC

## 2015-04-07 MED ORDER — METFORMIN HCL 500 MG PO TABS: 500.0000 mg | ORAL_TABLET | Freq: Every day | ORAL | Status: DC

## 2015-07-07 ENCOUNTER — Ambulatory Visit: Payer: BLUE CROSS/BLUE SHIELD | Admitting: Family Medicine

## 2015-07-17 ENCOUNTER — Encounter: Payer: Self-pay | Admitting: Family Medicine

## 2015-07-17 ENCOUNTER — Ambulatory Visit (INDEPENDENT_AMBULATORY_CARE_PROVIDER_SITE_OTHER): Payer: BLUE CROSS/BLUE SHIELD | Admitting: Family Medicine

## 2015-07-17 VITALS — BP 136/76 | HR 82 | Temp 98.3°F | Resp 14 | Ht 63.0 in | Wt 249.0 lb

## 2015-07-17 DIAGNOSIS — E1149 Type 2 diabetes mellitus with other diabetic neurological complication: Secondary | ICD-10-CM

## 2015-07-17 DIAGNOSIS — K219 Gastro-esophageal reflux disease without esophagitis: Secondary | ICD-10-CM | POA: Diagnosis not present

## 2015-07-17 DIAGNOSIS — R300 Dysuria: Secondary | ICD-10-CM

## 2015-07-17 DIAGNOSIS — Z1211 Encounter for screening for malignant neoplasm of colon: Secondary | ICD-10-CM

## 2015-07-17 DIAGNOSIS — E038 Other specified hypothyroidism: Secondary | ICD-10-CM | POA: Diagnosis not present

## 2015-07-17 DIAGNOSIS — K047 Periapical abscess without sinus: Secondary | ICD-10-CM

## 2015-07-17 DIAGNOSIS — E669 Obesity, unspecified: Secondary | ICD-10-CM

## 2015-07-17 DIAGNOSIS — I10 Essential (primary) hypertension: Secondary | ICD-10-CM

## 2015-07-17 LAB — URINALYSIS, ROUTINE W REFLEX MICROSCOPIC
BILIRUBIN URINE: NEGATIVE
Glucose, UA: NEGATIVE
KETONES UR: NEGATIVE
Leukocytes, UA: NEGATIVE
NITRITE: NEGATIVE
PROTEIN: NEGATIVE
Specific Gravity, Urine: 1.02 (ref 1.001–1.035)
pH: 6.5 (ref 5.0–8.0)

## 2015-07-17 LAB — URINALYSIS, MICROSCOPIC ONLY
CRYSTALS: NONE SEEN [HPF]
Casts: NONE SEEN [LPF]
YEAST: NONE SEEN [HPF]

## 2015-07-17 LAB — COMPREHENSIVE METABOLIC PANEL
ALBUMIN: 3.5 g/dL — AB (ref 3.6–5.1)
ALT: 16 U/L (ref 6–29)
AST: 15 U/L (ref 10–35)
Alkaline Phosphatase: 94 U/L (ref 33–130)
BILIRUBIN TOTAL: 0.5 mg/dL (ref 0.2–1.2)
BUN: 11 mg/dL (ref 7–25)
CO2: 31 mmol/L (ref 20–31)
CREATININE: 0.51 mg/dL (ref 0.50–0.99)
Calcium: 8.9 mg/dL (ref 8.6–10.4)
Chloride: 99 mmol/L (ref 98–110)
Glucose, Bld: 92 mg/dL (ref 70–99)
Potassium: 3.7 mmol/L (ref 3.5–5.3)
SODIUM: 141 mmol/L (ref 135–146)
TOTAL PROTEIN: 7 g/dL (ref 6.1–8.1)

## 2015-07-17 LAB — CBC WITH DIFFERENTIAL/PLATELET
BASOS ABS: 0 10*3/uL (ref 0.0–0.1)
Basophils Relative: 0 % (ref 0–1)
EOS ABS: 0.2 10*3/uL (ref 0.0–0.7)
Eosinophils Relative: 2 % (ref 0–5)
HEMATOCRIT: 40.4 % (ref 36.0–46.0)
HEMOGLOBIN: 12.8 g/dL (ref 12.0–15.0)
LYMPHS ABS: 2.9 10*3/uL (ref 0.7–4.0)
LYMPHS PCT: 37 % (ref 12–46)
MCH: 25.1 pg — ABNORMAL LOW (ref 26.0–34.0)
MCHC: 31.7 g/dL (ref 30.0–36.0)
MCV: 79.2 fL (ref 78.0–100.0)
MONOS PCT: 5 % (ref 3–12)
MPV: 10.1 fL (ref 8.6–12.4)
Monocytes Absolute: 0.4 10*3/uL (ref 0.1–1.0)
NEUTROS ABS: 4.4 10*3/uL (ref 1.7–7.7)
NEUTROS PCT: 56 % (ref 43–77)
Platelets: 272 10*3/uL (ref 150–400)
RBC: 5.1 MIL/uL (ref 3.87–5.11)
RDW: 14.9 % (ref 11.5–15.5)
WBC: 7.9 10*3/uL (ref 4.0–10.5)

## 2015-07-17 LAB — HEMOGLOBIN A1C
HEMOGLOBIN A1C: 6.4 % — AB (ref <5.7)
MEAN PLASMA GLUCOSE: 137 mg/dL — AB (ref <117)

## 2015-07-17 MED ORDER — AMOXICILLIN 500 MG PO CAPS: 500.0000 mg | ORAL_CAPSULE | Freq: Two times a day (BID) | ORAL | Status: DC

## 2015-07-17 NOTE — Assessment & Plan Note (Signed)
Recheck her A1c previously she was well-controlled. Her weight is up about 5 pounds and she has not been following her blood sugars also keep her less than 7%

## 2015-07-17 NOTE — Progress Notes (Signed)
[name], female   DOB: 13-Mar-1956, 60 y.o.   MRN: [id number]   Subjective:    [name], female    DOB: 1955/06/10, 60 y.o.   MRN: [id number]  Patient presents for 3 month F/U here for follow-up. She recently returned from a cruise. She omits that she's had urinary frequency for the past few weeks. She has not been checking her blood sugars. She did take her water pill a few days so she is not sure if it was just this. She is taking her medicine as prescribed purpura.  She's also had some epigastric discomfort and some reflux symptoms she's not been taking the Prilosec on a regular basis. She denies any change in her bowels denies any nausea or vomiting. She has had instances where she would burp and fill acid come up into her throat.  She continues to have stress with her daughter who is dealing with substance abuse but the family is trying her best to get her help.  She will like to be referred for a colonoscopy again  She also states that she has a broken tooth which feels like he has an abscess. She cannot get an appointment with the dentist for couple weeks.   Review Of Systems: per above   GEN- denies fatigue, fever, weight loss,weakness, recent illness HEENT- denies eye drainage, change in vision, nasal discharge, CVS- denies chest pain, palpitations RESP- denies SOB, cough, wheeze ABD- denies N/V, change in stools, abd pain GU- denies dysuria, hematuria, dribbling, incontinence MSK- denies joint pain, muscle aches, injury Neuro- denies headache, dizziness, syncope, seizure activity       Objective:    BP 136/76 mmHg  Pulse 82  Temp(Src) 98.3 F (36.8 C) (Oral)  Resp 14  Ht 5\' 3"  (1.6 m)  Wt 249 lb (112.946 kg)  BMI 44.12 kg/m2 GEN- NAD, alert and oriented x3 HEENT- PERRL, EOMI, non injected sclera, pink conjunctiva, MMM, oropharynx clear, Right upper molar cracked tooth, erythema, bogginess of gum Neck- Supple, no LAD  CVS-  RRR,2/6 SEM  RESP-CTAB ABD-NABS,soft,NT,ND, no CVA tenderness EXT- + edema Pulses- Radial, DP- 2+         Assessment & Plan:      Problem List Items Addressed This Visit    Obesity   Hypothyroidism   Relevant Orders   TSH   T3, free   T4, free   Essential hypertension    Blood pressure is okay today. Continue to reiterate taking her diuretic on a regular basis      Diabetes mellitus, type II ([place]) - Primary    Recheck her A1c previously she was well-controlled. Her weight is up about 5 pounds and she has not been following her blood sugars also keep her less than 7%      Relevant Orders   CBC with Differential/Platelet   Comprehensive metabolic panel   Hemoglobin A1c    Other Visit Diagnoses    Dysuria        No sign of UTI    Relevant Orders    Urinalysis, Routine w reflex microscopic (not at [facility]) (Completed)    Tooth abscess        amoxicillin x 7 days, salt water rinse       Note: This dictation was prepared with Dragon dictation along with smaller phrase technology. Any transcriptional errors that result from this process are unintentional.

## 2015-07-17 NOTE — Assessment & Plan Note (Signed)
Blood pressure is okay today. Continue to reiterate taking her diuretic on a regular basis

## 2015-07-17 NOTE — Assessment & Plan Note (Signed)
Advise prilosec daily

## 2015-07-17 NOTE — Patient Instructions (Signed)
Take prilosec once  A day  We will call with labs Take antibiotics for tooth COlonoscopy referral Dr. Gala Romney F/U  4 months

## 2015-07-18 LAB — TSH: TSH: 0.23 mIU/L — ABNORMAL LOW

## 2015-07-18 LAB — T3, FREE: T3, Free: 2.8 pg/mL (ref 2.3–4.2)

## 2015-07-18 LAB — T4, FREE: Free T4: 1.4 ng/dL (ref 0.8–1.8)

## 2015-07-20 ENCOUNTER — Encounter (INDEPENDENT_AMBULATORY_CARE_PROVIDER_SITE_OTHER): Payer: Self-pay | Admitting: *Deleted

## 2015-07-21 ENCOUNTER — Other Ambulatory Visit: Payer: Self-pay | Admitting: Family Medicine

## 2015-07-21 DIAGNOSIS — E038 Other specified hypothyroidism: Secondary | ICD-10-CM

## 2015-07-31 ENCOUNTER — Other Ambulatory Visit: Payer: Self-pay | Admitting: Family Medicine

## 2015-07-31 NOTE — Telephone Encounter (Signed)
200 mcg levothyroxine denied, pt on different dose

## 2015-08-03 ENCOUNTER — Telehealth: Payer: Self-pay | Admitting: Family Medicine

## 2015-08-03 NOTE — Telephone Encounter (Signed)
Pt is requesting a refill of Levothyroxine 200 mg [facility] in [place] [phone] 200 mg tabs.

## 2015-08-04 MED ORDER — LEVOTHYROXINE SODIUM 175 MCG PO TABS
175.0000 ug | ORAL_TABLET | Freq: Every day | ORAL | Status: DC
Start: 2015-08-04 — End: 2015-09-12

## 2015-08-04 NOTE — Telephone Encounter (Signed)
Per patient last TSH, levothyroxine should be decreased to 158mcg since TSH is overcorrected.   Call placed to patient to inquire.   [place].

## 2015-08-04 NOTE — Telephone Encounter (Signed)
Patient returned call.   States that she has not changed her dosage, but agrees to lower dose.   Prescription sent to pharmacy.

## 2015-08-17 ENCOUNTER — Other Ambulatory Visit (INDEPENDENT_AMBULATORY_CARE_PROVIDER_SITE_OTHER): Payer: Self-pay | Admitting: *Deleted

## 2015-08-17 DIAGNOSIS — Z800 Family history of malignant neoplasm of digestive organs: Secondary | ICD-10-CM

## 2015-08-17 LAB — HM DIABETES EYE EXAM

## 2015-08-28 ENCOUNTER — Encounter: Payer: Self-pay | Admitting: *Deleted

## 2015-09-01 ENCOUNTER — Other Ambulatory Visit: Payer: BLUE CROSS/BLUE SHIELD

## 2015-09-01 DIAGNOSIS — E038 Other specified hypothyroidism: Secondary | ICD-10-CM

## 2015-09-01 LAB — TSH: TSH: 1.43 mIU/L

## 2015-09-11 ENCOUNTER — Telehealth: Payer: Self-pay | Admitting: Family Medicine

## 2015-09-11 NOTE — Telephone Encounter (Signed)
Patient needs appointment.  No antibiotics are to be given without seeing patient, unless they were here <1 week prior for same issue.

## 2015-09-11 NOTE — Telephone Encounter (Signed)
Patient would like another antibiotic called into [facility] in [place]. She states it is for her tooth again she went to the dentist the other week and had a deep cleaning but they can't get to the bad tooth until Sep 21, 2015.  CB# [phone]

## 2015-09-12 ENCOUNTER — Other Ambulatory Visit: Payer: Self-pay | Admitting: Family Medicine

## 2015-09-14 NOTE — Telephone Encounter (Signed)
Refill appropriate and filled per protocol. 

## 2015-09-23 ENCOUNTER — Encounter (INDEPENDENT_AMBULATORY_CARE_PROVIDER_SITE_OTHER): Payer: Self-pay | Admitting: *Deleted

## 2015-09-23 ENCOUNTER — Other Ambulatory Visit (INDEPENDENT_AMBULATORY_CARE_PROVIDER_SITE_OTHER): Payer: Self-pay | Admitting: *Deleted

## 2015-09-23 NOTE — Telephone Encounter (Signed)
Patient needs trilyte 

## 2015-09-28 MED ORDER — PEG 3350-KCL-NA BICARB-NACL 420 G PO SOLR: 4000.0000 mL | Freq: Once | ORAL | Status: DC

## 2015-10-13 ENCOUNTER — Telehealth (INDEPENDENT_AMBULATORY_CARE_PROVIDER_SITE_OTHER): Payer: Self-pay | Admitting: *Deleted

## 2015-10-13 NOTE — Telephone Encounter (Signed)
agree

## 2015-10-13 NOTE — Telephone Encounter (Signed)
Referring MD/PCP: [place]   Procedure: tcs  Reason/Indication:  fam hx colon ca  Has patient had this procedure before?  Yes 5 yrs ago in [place]  If so, when, by whom and where?    Is there a family history of colon cancer?  Yes, mom, sister, cousins  Who?  What age when diagnosed?    Is patient diabetic?   yes      Does patient have prosthetic heart valve or mechanical valve?  no  Do you have a pacemaker?  no  Has patient ever had endocarditis? no  Has patient had joint replacement within last 12 months?  no  Does patient tend to be constipated or take laxatives? no  Does patient have a history of alcohol/drug use?  no  Is patient on Coumadin, Plavix and/or Aspirin? yes  Medications: carvediol 25 mg bid, chlorthalidone 25 mg daily, furosemide 40 mg prn, levothyroxine 175 mg daily, losartan 100 mg daily, metformin 500 mg daily, asa 81 mg daily, amlodipine 10 mg daily  Allergies: nkda  Medication Adjustment: asa 2 days, hold metformin morning of  Procedure date & time: 11/12/15 at 730

## 2015-11-11 ENCOUNTER — Other Ambulatory Visit (INDEPENDENT_AMBULATORY_CARE_PROVIDER_SITE_OTHER): Payer: Self-pay | Admitting: *Deleted

## 2015-11-11 DIAGNOSIS — Z800 Family history of malignant neoplasm of digestive organs: Secondary | ICD-10-CM

## 2015-11-11 NOTE — OR Nursing (Signed)
Called patient for pre-op phone call. Patient stated she did not know that she was having the procedure tomorrow and has not received any instructions in the mail. Patient stated she would like to reschedule for another day. Lacretia Nicks at Dr. Olevia Perches office notified and will get the patient rescheduled.

## 2015-11-12 ENCOUNTER — Ambulatory Visit (HOSPITAL_COMMUNITY)
Admission: RE | Admit: 2015-11-12 | Payer: BLUE CROSS/BLUE SHIELD | Source: Ambulatory Visit | Admitting: Internal Medicine

## 2015-11-12 ENCOUNTER — Encounter (HOSPITAL_COMMUNITY): Admission: RE | Payer: Self-pay | Source: Ambulatory Visit

## 2015-11-12 SURGERY — COLONOSCOPY
Anesthesia: Moderate Sedation

## 2015-11-17 ENCOUNTER — Other Ambulatory Visit: Payer: Self-pay | Admitting: Family Medicine

## 2015-12-09 ENCOUNTER — Encounter (INDEPENDENT_AMBULATORY_CARE_PROVIDER_SITE_OTHER): Payer: Self-pay | Admitting: *Deleted

## 2016-01-22 ENCOUNTER — Other Ambulatory Visit: Payer: Self-pay | Admitting: Family Medicine

## 2016-01-29 ENCOUNTER — Encounter: Payer: Self-pay | Admitting: Family Medicine

## 2016-01-29 ENCOUNTER — Ambulatory Visit (INDEPENDENT_AMBULATORY_CARE_PROVIDER_SITE_OTHER): Payer: BLUE CROSS/BLUE SHIELD | Admitting: Family Medicine

## 2016-01-29 VITALS — BP 134/72 | HR 88 | Temp 98.6°F | Resp 14 | Ht 63.0 in | Wt 277.0 lb

## 2016-01-29 DIAGNOSIS — F4321 Adjustment disorder with depressed mood: Secondary | ICD-10-CM

## 2016-01-29 DIAGNOSIS — F4381 Prolonged grief disorder: Secondary | ICD-10-CM

## 2016-01-29 DIAGNOSIS — Z1211 Encounter for screening for malignant neoplasm of colon: Secondary | ICD-10-CM

## 2016-01-29 DIAGNOSIS — E1149 Type 2 diabetes mellitus with other diabetic neurological complication: Secondary | ICD-10-CM

## 2016-01-29 DIAGNOSIS — E669 Obesity, unspecified: Secondary | ICD-10-CM

## 2016-01-29 DIAGNOSIS — E785 Hyperlipidemia, unspecified: Secondary | ICD-10-CM | POA: Diagnosis not present

## 2016-01-29 DIAGNOSIS — F329 Major depressive disorder, single episode, unspecified: Secondary | ICD-10-CM

## 2016-01-29 DIAGNOSIS — I519 Heart disease, unspecified: Secondary | ICD-10-CM | POA: Diagnosis not present

## 2016-01-29 DIAGNOSIS — I5189 Other ill-defined heart diseases: Secondary | ICD-10-CM

## 2016-01-29 DIAGNOSIS — I10 Essential (primary) hypertension: Secondary | ICD-10-CM

## 2016-01-29 DIAGNOSIS — E038 Other specified hypothyroidism: Secondary | ICD-10-CM | POA: Diagnosis not present

## 2016-01-29 DIAGNOSIS — G4733 Obstructive sleep apnea (adult) (pediatric): Secondary | ICD-10-CM

## 2016-01-29 DIAGNOSIS — F32A Depression, unspecified: Secondary | ICD-10-CM

## 2016-01-29 DIAGNOSIS — F4329 Adjustment disorder with other symptoms: Secondary | ICD-10-CM

## 2016-01-29 MED ORDER — LOSARTAN POTASSIUM 100 MG PO TABS: ORAL_TABLET | ORAL | 3 refills | Status: DC

## 2016-01-29 MED ORDER — OMEPRAZOLE 20 MG PO CPDR: 20.0000 mg | DELAYED_RELEASE_CAPSULE | Freq: Two times a day (BID) | ORAL | 3 refills | Status: DC | PRN

## 2016-01-29 MED ORDER — CHLORTHALIDONE 25 MG PO TABS
25.0000 mg | ORAL_TABLET | Freq: Every day | ORAL | 3 refills | Status: DC
Start: 2016-01-29 — End: 2016-10-07

## 2016-01-29 MED ORDER — BLOOD GLUCOSE TEST VI STRP: ORAL_STRIP | 0 refills | Status: DC

## 2016-01-29 MED ORDER — POTASSIUM CHLORIDE CRYS ER 20 MEQ PO TBCR: 20.0000 meq | EXTENDED_RELEASE_TABLET | Freq: Every day | ORAL | 3 refills | Status: DC

## 2016-01-29 MED ORDER — FUROSEMIDE 40 MG PO TABS: ORAL_TABLET | ORAL | 3 refills | Status: DC

## 2016-01-29 MED ORDER — AMLODIPINE BESYLATE 10 MG PO TABS: 10.0000 mg | ORAL_TABLET | Freq: Every day | ORAL | 3 refills | Status: DC

## 2016-01-29 MED ORDER — CARVEDILOL 25 MG PO TABS: 25.0000 mg | ORAL_TABLET | Freq: Two times a day (BID) | ORAL | 3 refills | Status: DC

## 2016-01-29 NOTE — Patient Instructions (Addendum)
Labs to be done Therapy in [facility]  Work on weight  F/U 3 months  Take lasix at least 3 days in row with potassium

## 2016-01-29 NOTE — Progress Notes (Signed)
Subjective:    [name], female    DOB: [birth year], 60 y.o.   MRN: [id number]  Patient presents for Fatigue (reports increased fastiguw with physical exertion or just talking if she is excited)   Pt here with general feeling of unwell, states she has been taking all her meds as prescribed. Has not followed up since Feb. Has gained almost 30lbs in that time. Stress at home and on job. Ongoing grief and depression from her son's death. She has not saught any counseling. Now her daughter and grandchildren are in the home and this is also a source of stress.  She is falling asleep at work at home, is not compliant with CPAP, sleep is fair. Always tired. Has back and knee pains with little exertion.   Knows she needs to make a change but cant seem to get things on track.    Needs new referral for screening colonoscopy  Review Of Systems:  GEN- denies fatigue, fever, weight loss,weakness, recent illness HEENT- denies eye drainage, change in vision, nasal discharge, CVS- denies chest pain, palpitations RESP- denies SOB, cough, wheeze ABD- denies N/V, change in stools, abd pain GU- denies dysuria, hematuria, dribbling, incontinence MSK- + joint pain, muscle aches, injury Neuro- denies headache, dizziness, syncope, seizure activity       Objective:    BP 134/72 (BP Location: Left Arm, Patient Position: Sitting, Cuff Size: Large)   Pulse 88   Temp 98.6 F (37 C) (Oral)   Resp 14   Ht 5\' 3"  (1.6 m)   Wt 277 lb (125.6 kg)   BMI 49.07 kg/m  GEN- NAD, alert and oriented x3 HEENT- PERRL, EOMI, non injected sclera, pink conjunctiva, MMM, oropharynx clear Neck- Supple, no thyromegaly, no JVD  CVS- RRR, no murmur RESP-CTAB ABD-NABS,soft,NT,ND MSK- Spine NT, no spasm, fair ROM Back/hips/knees, NO EFFUSION joints  Psych- stressed appearing, tearful at times, no SI, no hallucinations  EXT- 1+  Edema to mid shins  Pulses- Radial 2+        Assessment & Plan:    45  minutes spent with pt > 50% on counseling    Problem List Items Addressed This Visit    Obstructive sleep apnea    Has CPAP does not use regulary      Obesity   Hypothyroidism    Recheck TFT today ensure no hormonal imbalance       Relevant Medications   carvedilol (COREG) 25 MG tablet   Other Relevant Orders   TSH (Completed)   T4, free (Completed)   T3, free (Completed)   Hyperlipidemia   Relevant Medications   amLODipine (NORVASC) 10 MG tablet   carvedilol (COREG) 25 MG tablet   chlorthalidone (HYGROTON) 25 MG tablet   furosemide (LASIX) 40 MG tablet   losartan (COZAAR) 100 MG tablet   Other Relevant Orders   Lipid panel (Completed)   Essential hypertension    BP looks okay , no change to meds      Relevant Medications   amLODipine (NORVASC) 10 MG tablet   carvedilol (COREG) 25 MG tablet   chlorthalidone (HYGROTON) 25 MG tablet   furosemide (LASIX) 40 MG tablet   losartan (COZAAR) 100 MG tablet   Other Relevant Orders   CBC with Differential/Platelet (Completed)   Comprehensive metabolic panel (Completed)   Diastolic dysfunction    Chronic edema, not compliant with diuretic advised to take daily to help remove fluid,low salt diet       Diabetes  mellitus, type II ([place])    Continued weight gain in setting of DM, concern she will have further cardiovascular events from not improving her weight and focusing on her health Goal A1c less than 7%      Relevant Medications   losartan (COZAAR) 100 MG tablet   Other Relevant Orders   Lipid panel (Completed)   Hemoglobin A1c (Completed)   Depression (emotion) - Primary    I think her untreated depression/grief is at the center of her poor health.  There are so many confounding health factors from OSA, DM,dastolic dysfunction.  She admits she is depressed but still has not acted on therapy, declines medicatins. Now with daytime fatigue, stress eating, weight gain, more somatic complaints.  Discussed seeing a christian  therapy/psychiatric group in [place] she agrees today but follow up is key.        Other Visit Diagnoses   None.     Note: This dictation was prepared with Dragon dictation along with smaller phrase technology. Any transcriptional errors that result from this process are unintentional.

## 2016-01-30 ENCOUNTER — Encounter: Payer: Self-pay | Admitting: Family Medicine

## 2016-01-30 LAB — CBC WITH DIFFERENTIAL/PLATELET
Basophils Absolute: 0 cells/uL (ref 0–200)
Basophils Relative: 0 %
EOS ABS: 176 {cells}/uL (ref 15–500)
Eosinophils Relative: 2 %
HEMATOCRIT: 40 % (ref 35.0–45.0)
Hemoglobin: 12.5 g/dL (ref 12.0–15.0)
LYMPHS PCT: 35 %
Lymphs Abs: 3080 cells/uL (ref 850–3900)
MCH: 25.4 pg — AB (ref 27.0–33.0)
MCHC: 31.3 g/dL — AB (ref 32.0–36.0)
MCV: 81.1 fL (ref 80.0–100.0)
MONO ABS: 616 {cells}/uL (ref 200–950)
MPV: 9.9 fL (ref 7.5–12.5)
Monocytes Relative: 7 %
NEUTROS PCT: 56 %
Neutro Abs: 4928 cells/uL (ref 1500–7800)
Platelets: 299 10*3/uL (ref 140–400)
RBC: 4.93 MIL/uL (ref 3.80–5.10)
RDW: 15.3 % — AB (ref 11.0–15.0)
WBC: 8.8 10*3/uL (ref 3.8–10.8)

## 2016-01-30 LAB — COMPREHENSIVE METABOLIC PANEL
ALK PHOS: 69 U/L (ref 33–130)
ALT: 20 U/L (ref 6–29)
AST: 23 U/L (ref 10–35)
Albumin: 3.7 g/dL (ref 3.6–5.1)
BUN: 17 mg/dL (ref 7–25)
CALCIUM: 9.4 mg/dL (ref 8.6–10.4)
CHLORIDE: 101 mmol/L (ref 98–110)
CO2: 30 mmol/L (ref 20–31)
Creat: 0.82 mg/dL (ref 0.50–0.99)
GLUCOSE: 80 mg/dL (ref 70–99)
POTASSIUM: 3.7 mmol/L (ref 3.5–5.3)
Sodium: 143 mmol/L (ref 135–146)
Total Bilirubin: 0.3 mg/dL (ref 0.2–1.2)
Total Protein: 7.3 g/dL (ref 6.1–8.1)

## 2016-01-30 LAB — HEMOGLOBIN A1C
Hgb A1c MFr Bld: 6.3 % — ABNORMAL HIGH (ref <5.7)
MEAN PLASMA GLUCOSE: 134 mg/dL

## 2016-01-30 LAB — LIPID PANEL
CHOL/HDL RATIO: 3.2 ratio (ref <=5.0)
CHOLESTEROL: 183 mg/dL (ref 125–200)
HDL: 58 mg/dL (ref >=46)
LDL Cholesterol: 105 mg/dL (ref <130)
Triglycerides: 99 mg/dL (ref <150)
VLDL: 20 mg/dL (ref <30)

## 2016-01-30 LAB — TSH: TSH: 9.28 mIU/L — ABNORMAL HIGH

## 2016-01-30 LAB — T3, FREE: T3, Free: 1.9 pg/mL — ABNORMAL LOW (ref 2.3–4.2)

## 2016-01-30 LAB — T4, FREE: FREE T4: 0.8 ng/dL (ref 0.8–1.8)

## 2016-01-30 NOTE — Assessment & Plan Note (Addendum)
I think her untreated depression/grief is at the center of her poor health.  There are so many confounding health factors from OSA, DM,dastolic dysfunction.  She admits she is depressed but still has not acted on therapy, declines medicatins. Now with daytime fatigue, stress eating, weight gain, more somatic complaints.  Discussed seeing a christian therapy/psychiatric group in [place] she agrees today but follow up is key.

## 2016-01-30 NOTE — Assessment & Plan Note (Signed)
BP looks okay, no change to meds 

## 2016-01-30 NOTE — Assessment & Plan Note (Signed)
Chronic edema, not compliant with diuretic advised to take daily to help remove fluid,low salt diet

## 2016-01-30 NOTE — Assessment & Plan Note (Signed)
Recheck TFT today ensure no hormonal imbalance

## 2016-01-30 NOTE — Assessment & Plan Note (Signed)
Continued weight gain in setting of DM, concern she will have further cardiovascular events from not improving her weight and focusing on her health Goal A1c less than 7%

## 2016-01-30 NOTE — Assessment & Plan Note (Signed)
Has CPAP does not use regulary

## 2016-02-02 ENCOUNTER — Other Ambulatory Visit: Payer: Self-pay

## 2016-02-02 MED ORDER — LEVOTHYROXINE SODIUM 200 MCG PO TABS: 200.0000 ug | ORAL_TABLET | Freq: Every day | ORAL | 3 refills | Status: DC

## 2016-02-02 NOTE — Telephone Encounter (Signed)
Called pt regarding lab results no answer. New prescription sent in

## 2016-02-22 ENCOUNTER — Telehealth (INDEPENDENT_AMBULATORY_CARE_PROVIDER_SITE_OTHER): Payer: Self-pay | Admitting: *Deleted

## 2016-02-22 ENCOUNTER — Encounter (INDEPENDENT_AMBULATORY_CARE_PROVIDER_SITE_OTHER): Payer: Self-pay | Admitting: *Deleted

## 2016-02-22 NOTE — Telephone Encounter (Signed)
Referring MD/PCP: [place]   Procedure: tcs  Reason/Indication:  fam hx colon ca  Has patient had this procedure before?  Yes, 5 yrs ago in [place]  If so, when, by whom and where?    Is there a family history of colon cancer?  Yes, mom, sister, cousins  Who?  What age when diagnosed?    Is patient diabetic?   yes      Does patient have prosthetic heart valve or mechanical valve?  no  Do you have a pacemaker?  no  Has patient ever had endocarditis? no  Has patient had joint replacement within last 12 months?  no  Does patient tend to be constipated or take laxatives? no  Does patient have a history of alcohol/drug use?  no  Is patient on Coumadin, Plavix and/or Aspirin? yes  Medications: carvediol 25 mg bid, chlorthalidone 25 mg daily, furosemide 40 mg prn, levothyroxine 175 mg daily, losartan 100 mg daily, metformin 500 mg daily, asa 81 mg daily, amlodipine 10 mg daily  Allergies: nkda  Medication Adjustment: asa 2 days, hold metformin morning of  Procedure date & time: 03/24/16 at 10

## 2016-02-22 NOTE — Telephone Encounter (Signed)
Patient needs trilyte 

## 2016-02-23 MED ORDER — PEG 3350-KCL-NA BICARB-NACL 420 G PO SOLR: 4000.0000 mL | Freq: Once | ORAL | 0 refills | Status: AC

## 2016-02-23 NOTE — Telephone Encounter (Signed)
agree

## 2016-03-08 ENCOUNTER — Encounter: Payer: Self-pay | Admitting: Family Medicine

## 2016-03-08 ENCOUNTER — Ambulatory Visit (INDEPENDENT_AMBULATORY_CARE_PROVIDER_SITE_OTHER): Payer: BLUE CROSS/BLUE SHIELD | Admitting: Family Medicine

## 2016-03-08 VITALS — BP 142/86 | HR 78 | Temp 98.3°F | Resp 16 | Ht 63.0 in | Wt 269.0 lb

## 2016-03-08 DIAGNOSIS — R42 Dizziness and giddiness: Secondary | ICD-10-CM | POA: Diagnosis not present

## 2016-03-08 DIAGNOSIS — E038 Other specified hypothyroidism: Secondary | ICD-10-CM | POA: Diagnosis not present

## 2016-03-08 MED ORDER — MECLIZINE HCL 12.5 MG PO TABS: 12.5000 mg | ORAL_TABLET | Freq: Three times a day (TID) | ORAL | 0 refills | Status: DC | PRN

## 2016-03-08 NOTE — Patient Instructions (Signed)
Use meclizine as prescribed  We will call with lab results F/U as previous

## 2016-03-08 NOTE — Progress Notes (Signed)
Orthostatic Blood Pressure:  Lying: 142/ 82  Sitting: 142/ 86  Standing: 136/86

## 2016-03-08 NOTE — Progress Notes (Signed)
   Subjective:    [name], female    DOB: Jul 09, 1955, 60 y.o.   MRN: [id number]  Patient presents for Vertigo (x2 days- one episode of dizziness on Sun- states that she was really nauseous while having dizzy spell) Patient with vertigo-like symptoms for the past 2 days. 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 She does admit that she was low fatigue from standing up Saturday night preparing for the ceremony. She had had a small amount to eat  but begin filling more woozy when she got up at the wedding. She sat down for a few minutes and tried again that time she had some nausea with that woozy/dizzy feeling. She denies any spinning sensation denies any headache chest pain shortness of breath associated. She had a similar event at that time her blood pressure was significantly elevated. She has been taking her medicines as prescribed at the exception of the diuretic. She had a milder episode yesterday but today she feels well. She's not had any recent sinus pressure or drainage no recent illness. No change in her medications recently.  She also has hypothyroidism was not taking her medicines as prescribed she's due for repeat thyroid function studies from her labs in September, she is currently on 200 g once a day    Review Of Systems:  GEN- denies fatigue, fever, weight loss,weakness, recent illness HEENT- denies eye drainage, change in vision, nasal discharge, CVS- denies chest pain, palpitations RESP- denies SOB, cough, wheeze ABD- denies N/V, change in stools, abd pain GU- denies dysuria, hematuria, dribbling, incontinence MSK- denies joint pain, muscle aches, injury Neuro- denies headache, +dizziness, syncope, seizure activity       Objective:    BP (!) 142/86 (BP Location: Right Arm, Patient Position: Sitting, Cuff Size: Large)   Pulse 78   Temp 98.3 F (36.8 C) (Oral)   Resp 16   Ht  5\' 3"  (1.6 m)   Wt 269 lb (122 kg)   BMI 47.65 kg/m  GEN- NAD, alert and oriented x3,well appearing  HEENT- PERRL, EOMI, non injected sclera, pink conjunctiva, MMM, oropharynx clear Neck- Supple, no bruit  CVS- RRR, no murmur RESP-CTAB Neuro-CNII-XII intact, no deficits, no nystagmus  Ext- edema to mid shins, piting         Assessment & Plan:      Problem List Items Addressed This Visit    Hypothyroidism - Primary   Relevant Orders   T4, free   TSH   T3, free    Other Visit Diagnoses    Dizziness and giddiness       likley MTF, does not sound like true Vertigo, BP up a little, but no diuretic taken yet. Now back at baseline, check TFT per above. Benign exam, given Meclizine as she will be traveling tomorrow in case she has recurrence of symptoms    Relevant Medications   meclizine (ANTIVERT) 12.5 MG tablet   Other Relevant Orders   Basic metabolic panel      Note: This dictation was prepared with Dragon dictation along with smaller phrase technology. Any transcriptional errors that result from this process are unintentional.

## 2016-03-09 LAB — T4, FREE: Free T4: 1 ng/dL (ref 0.8–1.8)

## 2016-03-09 LAB — BASIC METABOLIC PANEL
BUN: 13 mg/dL (ref 7–25)
CHLORIDE: 102 mmol/L (ref 98–110)
CO2: 30 mmol/L (ref 20–31)
CREATININE: 0.6 mg/dL (ref 0.50–0.99)
Calcium: 8.9 mg/dL (ref 8.6–10.4)
Glucose, Bld: 96 mg/dL (ref 70–99)
Potassium: 3.9 mmol/L (ref 3.5–5.3)
Sodium: 144 mmol/L (ref 135–146)

## 2016-03-09 LAB — TSH: TSH: 6.33 mIU/L — ABNORMAL HIGH

## 2016-03-09 LAB — T3, FREE: T3 FREE: 2.2 pg/mL — AB (ref 2.3–4.2)

## 2016-03-10 ENCOUNTER — Other Ambulatory Visit: Payer: Self-pay | Admitting: *Deleted

## 2016-03-10 DIAGNOSIS — E038 Other specified hypothyroidism: Secondary | ICD-10-CM

## 2016-03-24 ENCOUNTER — Ambulatory Visit (HOSPITAL_COMMUNITY): Admit: 2016-03-24 | Payer: BLUE CROSS/BLUE SHIELD | Admitting: Internal Medicine

## 2016-03-24 ENCOUNTER — Encounter (HOSPITAL_COMMUNITY): Payer: Self-pay

## 2016-03-24 SURGERY — COLONOSCOPY
Anesthesia: Moderate Sedation

## 2016-06-03 ENCOUNTER — Encounter: Payer: Self-pay | Admitting: Family Medicine

## 2016-06-03 ENCOUNTER — Ambulatory Visit (INDEPENDENT_AMBULATORY_CARE_PROVIDER_SITE_OTHER): Payer: BLUE CROSS/BLUE SHIELD | Admitting: Family Medicine

## 2016-06-03 VITALS — BP 122/80 | HR 76 | Temp 97.6°F | Resp 18 | Wt 290.0 lb

## 2016-06-03 DIAGNOSIS — G4733 Obstructive sleep apnea (adult) (pediatric): Secondary | ICD-10-CM | POA: Diagnosis not present

## 2016-06-03 DIAGNOSIS — R600 Localized edema: Secondary | ICD-10-CM | POA: Diagnosis not present

## 2016-06-03 DIAGNOSIS — F321 Major depressive disorder, single episode, moderate: Secondary | ICD-10-CM

## 2016-06-03 DIAGNOSIS — E038 Other specified hypothyroidism: Secondary | ICD-10-CM

## 2016-06-03 DIAGNOSIS — E1149 Type 2 diabetes mellitus with other diabetic neurological complication: Secondary | ICD-10-CM

## 2016-06-03 DIAGNOSIS — F4329 Adjustment disorder with other symptoms: Secondary | ICD-10-CM

## 2016-06-03 DIAGNOSIS — Z6841 Body Mass Index (BMI) 40.0 and over, adult: Secondary | ICD-10-CM

## 2016-06-03 DIAGNOSIS — F4321 Adjustment disorder with depressed mood: Secondary | ICD-10-CM

## 2016-06-03 DIAGNOSIS — I10 Essential (primary) hypertension: Secondary | ICD-10-CM | POA: Diagnosis not present

## 2016-06-03 DIAGNOSIS — IMO0001 Reserved for inherently not codable concepts without codable children: Secondary | ICD-10-CM

## 2016-06-03 DIAGNOSIS — E6609 Other obesity due to excess calories: Secondary | ICD-10-CM | POA: Diagnosis not present

## 2016-06-03 DIAGNOSIS — F4381 Prolonged grief disorder: Secondary | ICD-10-CM

## 2016-06-03 NOTE — Progress Notes (Signed)
Subjective:    [name], female    DOB: 06-Nov-1955, 61 y.o.   MRN: [id number]  Patient presents for follow up after med chg (thyroid med) Patient here to follow-up hypothyroidism and her last visit in October her levels were improving she is currently on 200 g of Synthroid. She is due for repeat TSH today. Her weight is up 21 pounds, She admits to emotional eating and not taking good care of herself. She has not been exercising she's been working a lot to try to take her mind up for her son who passed away at daughter whom has substance abuse. She feels like a lot of things are not falling into place with the family at this time. She has not sought out any therapy but like her previous visit states that she noticed that she needs some help. She finds herself more fatigued and sleeping during the day. She has not been using her CPAP as prescribed     Review Of Systems:  GEN- denies fatigue, fever, weight loss,weakness, recent illness HEENT- denies eye drainage, change in vision, nasal discharge, CVS- denies chest pain, palpitations RESP- denies SOB, cough, wheeze ABD- denies N/V, change in stools, abd pain GU- denies dysuria, hematuria, dribbling, incontinence MSK- denies joint pain, muscle aches, injury Neuro- denies headache, dizziness, syncope, seizure activity       Objective:    BP 122/80   Pulse 76   Temp 97.6 F (36.4 C) (Oral)   Resp 18   Wt 290 lb (131.5 kg)   BMI 51.37 kg/m  GEN- NAD, alert and oriented x3 HEENT- PERRL, EOMI, non injected sclera, pink conjunctiva, MMM, oropharynx clear Neck- Supple, no thyromegaly CVS- RRR, sytolic murmur RESP-CTAB Psych- not anxious appearing, depressed affect, no SI  EXT- + bilat pitting  edema Pulses- Radial  2+        Assessment & Plan:      Problem List Items Addressed This Visit    Obstructive sleep apnea    Discussed importance of compliance with CPAP This affects her breathing, daytime  fatigue, HTN Her increasing obesity however is making things more challenging and life threatning       Obesity   Relevant Orders   Ambulatory referral to Psychology   Leg edema    Advised to take her diuretic which is often non compliant with      Hypothyroidism - Primary    Recheck thyroid levels  She has significant underlying depression, since her son's death and multiple stressors with her other children, she is neglecting herself, she agrees with this but still has a mental block on taking the steps to properly take care of herself and has a unhealthy relationship with food. While interested in weight loss surgery, she needs to tackle the mental aspect. She agrees to have her referred for mental health services       Relevant Orders   TSH (Completed)   T3, free (Completed)   T4, free (Completed)   Essential hypertension   Relevant Orders   CBC with Differential/Platelet (Completed)   Comprehensive metabolic panel (Completed)   Diabetes mellitus, type II ([place])   Relevant Orders   Hemoglobin A1c (Completed)   Depression (emotion)   Relevant Orders   Ambulatory referral to Psychology    Other Visit Diagnoses    Grief reaction with prolonged bereavement       Relevant Orders   Ambulatory referral to Psychology      Note: This  dictation was prepared with Dragon dictation along with smaller phrase technology. Any transcriptional errors that result from this process are unintentional.

## 2016-06-03 NOTE — Patient Instructions (Addendum)
Referral for therapy We will call with lab results Schedule a seminar with bariatric program at [email or web address] F/U 3 months

## 2016-06-04 LAB — COMPREHENSIVE METABOLIC PANEL
ALK PHOS: 73 U/L (ref 33–130)
ALT: 16 U/L (ref 6–29)
AST: 19 U/L (ref 10–35)
Albumin: 3.5 g/dL — ABNORMAL LOW (ref 3.6–5.1)
BUN: 17 mg/dL (ref 7–25)
CO2: 34 mmol/L — ABNORMAL HIGH (ref 20–31)
Calcium: 8.7 mg/dL (ref 8.6–10.4)
Chloride: 103 mmol/L (ref 98–110)
Creat: 0.66 mg/dL (ref 0.50–0.99)
GLUCOSE: 90 mg/dL (ref 70–99)
POTASSIUM: 3.7 mmol/L (ref 3.5–5.3)
Sodium: 142 mmol/L (ref 135–146)
Total Bilirubin: 0.3 mg/dL (ref 0.2–1.2)
Total Protein: 6.9 g/dL (ref 6.1–8.1)

## 2016-06-04 LAB — CBC WITH DIFFERENTIAL/PLATELET
BASOS PCT: 0 %
Basophils Absolute: 0 cells/uL (ref 0–200)
Eosinophils Absolute: 158 cells/uL (ref 15–500)
Eosinophils Relative: 2 %
HCT: 38.7 % (ref 35.0–45.0)
Hemoglobin: 12.2 g/dL (ref 12.0–15.0)
LYMPHS PCT: 33 %
Lymphs Abs: 2607 cells/uL (ref 850–3900)
MCH: 25.8 pg — ABNORMAL LOW (ref 27.0–33.0)
MCHC: 31.5 g/dL — ABNORMAL LOW (ref 32.0–36.0)
MCV: 82 fL (ref 80.0–100.0)
MONOS PCT: 7 %
MPV: 10 fL (ref 7.5–12.5)
Monocytes Absolute: 553 cells/uL (ref 200–950)
Neutro Abs: 4582 cells/uL (ref 1500–7800)
Neutrophils Relative %: 58 %
PLATELETS: 287 10*3/uL (ref 140–400)
RBC: 4.72 MIL/uL (ref 3.80–5.10)
RDW: 14.1 % (ref 11.0–15.0)
WBC: 7.9 10*3/uL (ref 3.8–10.8)

## 2016-06-04 LAB — T3, FREE: T3 FREE: 2.3 pg/mL (ref 2.3–4.2)

## 2016-06-04 LAB — T4, FREE: Free T4: 1.3 ng/dL (ref 0.8–1.8)

## 2016-06-04 LAB — TSH: TSH: 0.81 m[IU]/L

## 2016-06-04 LAB — HEMOGLOBIN A1C
Hgb A1c MFr Bld: 6.4 % — ABNORMAL HIGH (ref <5.7)
Mean Plasma Glucose: 137 mg/dL

## 2016-06-04 NOTE — Assessment & Plan Note (Signed)
Discussed importance of compliance with CPAP This affects her breathing, daytime fatigue, HTN Her increasing obesity however is making things more challenging and life threatning

## 2016-06-04 NOTE — Assessment & Plan Note (Signed)
Recheck thyroid levels  She has significant underlying depression, since her son's death and multiple stressors with her other children, she is neglecting herself, she agrees with this but still has a mental block on taking the steps to properly take care of herself and has a unhealthy relationship with food. While interested in weight loss surgery, she needs to tackle the mental aspect. She agrees to have her referred for mental health services

## 2016-06-04 NOTE — Assessment & Plan Note (Signed)
Advised to take her diuretic which is often non compliant with

## 2016-06-13 ENCOUNTER — Other Ambulatory Visit: Payer: Self-pay | Admitting: *Deleted

## 2016-06-13 MED ORDER — METFORMIN HCL 500 MG PO TABS: 500.0000 mg | ORAL_TABLET | Freq: Two times a day (BID) | ORAL | 3 refills | Status: DC

## 2016-07-21 ENCOUNTER — Telehealth (HOSPITAL_COMMUNITY): Payer: Self-pay | Admitting: *Deleted

## 2016-07-21 NOTE — Telephone Encounter (Signed)
left voice message regarding an appointment. 

## 2016-07-22 ENCOUNTER — Other Ambulatory Visit: Payer: Self-pay | Admitting: Family Medicine

## 2016-08-16 ENCOUNTER — Telehealth (HOSPITAL_COMMUNITY): Payer: Self-pay | Admitting: *Deleted

## 2016-08-16 NOTE — Telephone Encounter (Signed)
left voice message regarding an appointment. 

## 2016-08-29 ENCOUNTER — Telehealth (HOSPITAL_COMMUNITY): Payer: Self-pay | Admitting: *Deleted

## 2016-08-29 NOTE — Telephone Encounter (Signed)
left voice message regarding scheduling an appointment. 

## 2016-08-31 ENCOUNTER — Telehealth (HOSPITAL_COMMUNITY): Payer: Self-pay | Admitting: *Deleted

## 2016-08-31 NOTE — Telephone Encounter (Signed)
left voice message regarding an appointment. 

## 2016-09-02 ENCOUNTER — Telehealth: Payer: Self-pay | Admitting: Family Medicine

## 2016-09-02 MED ORDER — LEVOTHYROXINE SODIUM 200 MCG PO TABS: ORAL_TABLET | ORAL | 3 refills | Status: DC

## 2016-09-02 NOTE — Telephone Encounter (Signed)
Prescription sent to pharmacy.

## 2016-09-02 NOTE — Telephone Encounter (Signed)
Pt needs synthroid sent in

## 2016-09-09 ENCOUNTER — Ambulatory Visit (INDEPENDENT_AMBULATORY_CARE_PROVIDER_SITE_OTHER): Payer: BLUE CROSS/BLUE SHIELD | Admitting: Family Medicine

## 2016-09-09 ENCOUNTER — Encounter: Payer: Self-pay | Admitting: Family Medicine

## 2016-09-09 ENCOUNTER — Telehealth: Payer: Self-pay | Admitting: *Deleted

## 2016-09-09 VITALS — BP 138/82 | HR 80 | Temp 98.2°F | Resp 16 | Ht 63.0 in | Wt 269.0 lb

## 2016-09-09 DIAGNOSIS — E1149 Type 2 diabetes mellitus with other diabetic neurological complication: Secondary | ICD-10-CM

## 2016-09-09 DIAGNOSIS — I10 Essential (primary) hypertension: Secondary | ICD-10-CM

## 2016-09-09 DIAGNOSIS — Z6841 Body Mass Index (BMI) 40.0 and over, adult: Secondary | ICD-10-CM | POA: Diagnosis not present

## 2016-09-09 DIAGNOSIS — F321 Major depressive disorder, single episode, moderate: Secondary | ICD-10-CM

## 2016-09-09 DIAGNOSIS — I519 Heart disease, unspecified: Secondary | ICD-10-CM | POA: Diagnosis not present

## 2016-09-09 DIAGNOSIS — E038 Other specified hypothyroidism: Secondary | ICD-10-CM

## 2016-09-09 DIAGNOSIS — Z1239 Encounter for other screening for malignant neoplasm of breast: Secondary | ICD-10-CM

## 2016-09-09 DIAGNOSIS — G4733 Obstructive sleep apnea (adult) (pediatric): Secondary | ICD-10-CM

## 2016-09-09 DIAGNOSIS — Z1211 Encounter for screening for malignant neoplasm of colon: Secondary | ICD-10-CM

## 2016-09-09 DIAGNOSIS — Z1231 Encounter for screening mammogram for malignant neoplasm of breast: Secondary | ICD-10-CM

## 2016-09-09 DIAGNOSIS — I5189 Other ill-defined heart diseases: Secondary | ICD-10-CM

## 2016-09-09 DIAGNOSIS — E66813 Obesity, class 3: Secondary | ICD-10-CM

## 2016-09-09 MED ORDER — ESCITALOPRAM OXALATE 10 MG PO TABS: 10.0000 mg | ORAL_TABLET | Freq: Every day | ORAL | 3 refills | Status: DC

## 2016-09-09 NOTE — Telephone Encounter (Signed)
Received call from patient daughter [name] [phone].  States that she has concerns about her mother that she would like to discuss with MD. Reports that she does not think patient is giving MD "whole story". Would not elaborate further.   Advised that she can accompany patient and discuss concerns with MD. States that she will not be able to get off work in time for appointment.   Requested MD to call to discuss concerns.

## 2016-09-09 NOTE — Progress Notes (Signed)
Subjective:    [name], female    DOB: [birth year], 61 y.o.   MRN: [id number]  Patient presents for F/U    Pt here for f/u, note her daughter called before visit, see the Previous note. In summary mother has not been taking care of herself she's not taking her medications as described their concern for her health. She seems very stubborn about getting help with her depression.    Hypothyroidism- msising doses   CHF- has more fluid, often misses her meds    Depression-  wosrened She now has one of her daughters had moved back in with her along with her 3 children and she also has custody of her other daughter's child is still grieving from the loss of her son thinks it is very overwhelming. Her family has tried to talk to her and she agrees that she needs to see someone. She states that she did receive a call about the therapy but never followed through with it. She is ready to start therapy as well as a medication with her depression. She is not sleeping very well she finds her mind racing she's anxious. She knows that she is not taking good care of herself as well.   OSA- not using CPAP regulary states something wrong with hose and adapter    She she states that the past 2 weeks she tried to change her diet she's not eating after 3:00 she feels like this works for her to help her lose weight. She feels better when she does this feels like she can control her eating habits compared to when she gets home. She is not checking her sugars regularly.  He requests a colonoscopy and mammogram be set up again  Review Of Systems:  GEN- + fatigue, fever, weight loss,weakness, recent illness HEENT- denies eye drainage, change in vision, nasal discharge, CVS- denies chest pain, palpitations RESP- denies SOB, cough, wheeze ABD- denies N/V, change in stools, abd pain GU- denies dysuria, hematuria, dribbling, incontinence MSK- denies joint pain, muscle aches, injury Neuro- denies  headache, dizziness, syncope, seizure activity       Objective:    BP 138/82   Pulse 80   Temp 98.2 F (36.8 C) (Oral)   Resp 16   Ht 5\' 3"  (1.6 m)   Wt 269 lb (122 kg)   SpO2 98%   BMI 47.65 kg/m  GEN- NAD, alert and oriented x3 HEENT- PERRL, EOMI, non injected sclera, pink conjunctiva, MMM, oropharynx clear Neck- Supple, no thyromegaly , NO JVD  CVS- RRR, sytolic murmur RESP-CTAB Psych- not anxious appearing, depressed affect, no SI  EXT- 2+ bilat LE edema Pulses- Radial, DP- 2+        Assessment & Plan:      Problem List Items Addressed This Visit    Obstructive sleep apnea - Primary    She given for renewal of her equipment needed discussed the importance of using her CPAP on a regular basis. This affects her blood pressure and her congestive heart failure      Obesity   Hypothyroidism    Check thyroid function studies that she is not taking regularly will likely not change the dose and work on getting her to commit to taking this on a regular basis.      Relevant Orders   TSH (Completed)   T3, free (Completed)   T4, free (Completed)   Essential hypertension    There control per above he's to take  medications regularly He discussed possibly blister packing her medications he states that this still was not keep her on track is just her actually taking the medications to matter how they are dispensed      Relevant Orders   CBC with Differential/Platelet (Completed)   Comprehensive metabolic panel (Completed)   Lipid panel (Completed)   Diastolic dysfunction    She always has chronic edema does not take her diuretic regularly. Discussed importance of taking her diuretic she is to take twice a day for the next few days.      Diabetes mellitus, type II ([place])    Check her A1c. In general her diabetes has not been a difficult thing to control. She is on metformin will check her renal function goal is less than 7%      Relevant Orders   CBC with  Differential/Platelet (Completed)   Comprehensive metabolic panel (Completed)   Hemoglobin A1c (Completed)   Lipid panel (Completed)   Depression (emotion)    We'll start Lexapro she agrees to take the medication we discussed the side effects of the medication. She was also given the number to call directly for her therapy which I think will be the most beneficial getting her back on track. We've had numerous conversations about this. See previous notes      Relevant Medications   escitalopram (LEXAPRO) 10 MG tablet    Other Visit Diagnoses    Colon cancer screening       Relevant Orders   Ambulatory referral to Gastroenterology   Breast cancer screening       Relevant Orders   MS DIGITAL SCREENING TOMO BILATERAL      Note: This dictation was prepared with Dragon dictation along with smaller phrase technology. Any transcriptional errors that result from this process are unintentional.

## 2016-09-09 NOTE — Patient Instructions (Addendum)
CPAP prescription given  Increase lasix to twice a day for next week for swelling  We will call with lab results Schedule your mammogram Colonoscopy referral sent  Start lexapro 10mg  at bedtime Call [facility] to set up your therapy F/U 4 weeks- Fridays

## 2016-09-09 NOTE — Telephone Encounter (Signed)
Spoke with pt daughter, she has not been taking meds regulary , not using CPAP, not eating correctly and declining at home. Won't listen to daughters her husband [name]  She has appt today I will discuss with her

## 2016-09-10 LAB — CBC WITH DIFFERENTIAL/PLATELET
BASOS ABS: 0 {cells}/uL (ref 0–200)
Basophils Relative: 0 %
EOS ABS: 146 {cells}/uL (ref 15–500)
Eosinophils Relative: 2 %
HEMATOCRIT: 40.6 % (ref 35.0–45.0)
HEMOGLOBIN: 12.6 g/dL (ref 12.0–15.0)
LYMPHS ABS: 2555 {cells}/uL (ref 850–3900)
Lymphocytes Relative: 35 %
MCH: 25 pg — ABNORMAL LOW (ref 27.0–33.0)
MCHC: 31 g/dL — ABNORMAL LOW (ref 32.0–36.0)
MCV: 80.7 fL (ref 80.0–100.0)
MONO ABS: 511 {cells}/uL (ref 200–950)
MPV: 10.1 fL (ref 7.5–12.5)
Monocytes Relative: 7 %
NEUTROS PCT: 56 %
Neutro Abs: 4088 cells/uL (ref 1500–7800)
Platelets: 279 10*3/uL (ref 140–400)
RBC: 5.03 MIL/uL (ref 3.80–5.10)
RDW: 15.2 % — ABNORMAL HIGH (ref 11.0–15.0)
WBC: 7.3 10*3/uL (ref 3.8–10.8)

## 2016-09-10 LAB — LIPID PANEL
Cholesterol: 176 mg/dL (ref <200)
HDL: 58 mg/dL (ref >50)
LDL CALC: 97 mg/dL (ref <100)
Total CHOL/HDL Ratio: 3 Ratio (ref <5.0)
Triglycerides: 106 mg/dL (ref <150)
VLDL: 21 mg/dL (ref <30)

## 2016-09-10 LAB — COMPREHENSIVE METABOLIC PANEL
ALBUMIN: 3.7 g/dL (ref 3.6–5.1)
ALK PHOS: 76 U/L (ref 33–130)
ALT: 18 U/L (ref 6–29)
AST: 20 U/L (ref 10–35)
BUN: 11 mg/dL (ref 7–25)
CALCIUM: 9.5 mg/dL (ref 8.6–10.4)
CO2: 33 mmol/L — ABNORMAL HIGH (ref 20–31)
Chloride: 100 mmol/L (ref 98–110)
Creat: 0.6 mg/dL (ref 0.50–0.99)
Glucose, Bld: 101 mg/dL — ABNORMAL HIGH (ref 70–99)
POTASSIUM: 3.6 mmol/L (ref 3.5–5.3)
Sodium: 143 mmol/L (ref 135–146)
Total Bilirubin: 0.3 mg/dL (ref 0.2–1.2)
Total Protein: 7.2 g/dL (ref 6.1–8.1)

## 2016-09-10 LAB — HEMOGLOBIN A1C
Hgb A1c MFr Bld: 5.9 % — ABNORMAL HIGH (ref <5.7)
MEAN PLASMA GLUCOSE: 123 mg/dL

## 2016-09-10 LAB — T3, FREE: T3, Free: 2.3 pg/mL (ref 2.3–4.2)

## 2016-09-10 LAB — TSH: TSH: 1.59 m[IU]/L

## 2016-09-10 LAB — T4, FREE: Free T4: 1.2 ng/dL (ref 0.8–1.8)

## 2016-09-11 NOTE — Assessment & Plan Note (Signed)
She always has chronic edema does not take her diuretic regularly. Discussed importance of taking her diuretic she is to take twice a day for the next few days.

## 2016-09-11 NOTE — Assessment & Plan Note (Signed)
There control per above he's to take medications regularly He discussed possibly blister packing her medications he states that this still was not keep her on track is just her actually taking the medications to matter how they are dispensed

## 2016-09-11 NOTE — Assessment & Plan Note (Signed)
We'll start Lexapro she agrees to take the medication we discussed the side effects of the medication. She was also given the number to call directly for her therapy which I think will be the most beneficial getting her back on track. We've had numerous conversations about this. See previous notes

## 2016-09-11 NOTE — Assessment & Plan Note (Signed)
Check her A1c. In general her diabetes has not been a difficult thing to control. She is on metformin will check her renal function goal is less than 7%

## 2016-09-11 NOTE — Assessment & Plan Note (Signed)
She given for renewal of her equipment needed discussed the importance of using her CPAP on a regular basis. This affects her blood pressure and her congestive heart failure

## 2016-09-11 NOTE — Assessment & Plan Note (Addendum)
Check thyroid function studies that she is not taking regularly will likely not change the dose and work on getting her to commit to taking this on a regular basis.

## 2016-09-13 ENCOUNTER — Other Ambulatory Visit: Payer: Self-pay | Admitting: Family Medicine

## 2016-09-13 DIAGNOSIS — Z1231 Encounter for screening mammogram for malignant neoplasm of breast: Secondary | ICD-10-CM

## 2016-09-19 ENCOUNTER — Ambulatory Visit (HOSPITAL_COMMUNITY)
Admission: RE | Admit: 2016-09-19 | Discharge: 2016-09-19 | Disposition: A | Discharge status: Home / Self Care | Payer: BLUE CROSS/BLUE SHIELD | Source: Ambulatory Visit | Attending: Family Medicine | Admitting: Family Medicine

## 2016-09-19 DIAGNOSIS — Z1231 Encounter for screening mammogram for malignant neoplasm of breast: Secondary | ICD-10-CM

## 2016-10-07 ENCOUNTER — Ambulatory Visit: Payer: BLUE CROSS/BLUE SHIELD | Admitting: Family Medicine

## 2016-10-07 ENCOUNTER — Ambulatory Visit (INDEPENDENT_AMBULATORY_CARE_PROVIDER_SITE_OTHER): Payer: BLUE CROSS/BLUE SHIELD | Admitting: Family Medicine

## 2016-10-07 ENCOUNTER — Encounter: Payer: Self-pay | Admitting: Family Medicine

## 2016-10-07 VITALS — BP 132/68 | HR 72 | Temp 98.2°F | Resp 14 | Ht 63.0 in | Wt 266.0 lb

## 2016-10-07 DIAGNOSIS — I10 Essential (primary) hypertension: Secondary | ICD-10-CM

## 2016-10-07 DIAGNOSIS — R600 Localized edema: Secondary | ICD-10-CM

## 2016-10-07 DIAGNOSIS — I519 Heart disease, unspecified: Secondary | ICD-10-CM

## 2016-10-07 DIAGNOSIS — F321 Major depressive disorder, single episode, moderate: Secondary | ICD-10-CM | POA: Diagnosis not present

## 2016-10-07 DIAGNOSIS — I5189 Other ill-defined heart diseases: Secondary | ICD-10-CM

## 2016-10-07 MED ORDER — AMLODIPINE BESYLATE 10 MG PO TABS: 10.0000 mg | ORAL_TABLET | Freq: Every day | ORAL | 3 refills | Status: DC

## 2016-10-07 MED ORDER — CHLORTHALIDONE 25 MG PO TABS: 25.0000 mg | ORAL_TABLET | Freq: Every day | ORAL | 3 refills | Status: DC

## 2016-10-07 MED ORDER — POTASSIUM CHLORIDE CRYS ER 20 MEQ PO TBCR: 20.0000 meq | EXTENDED_RELEASE_TABLET | Freq: Every day | ORAL | 3 refills | Status: DC

## 2016-10-07 MED ORDER — LOSARTAN POTASSIUM 100 MG PO TABS
ORAL_TABLET | ORAL | 3 refills | Status: DC
Start: 2016-10-07 — End: 2017-11-17

## 2016-10-07 MED ORDER — METFORMIN HCL 500 MG PO TABS: 500.0000 mg | ORAL_TABLET | Freq: Two times a day (BID) | ORAL | 3 refills | Status: DC

## 2016-10-07 MED ORDER — FUROSEMIDE 40 MG PO TABS: ORAL_TABLET | ORAL | 3 refills | Status: DC

## 2016-10-07 MED ORDER — CARVEDILOL 25 MG PO TABS: 25.0000 mg | ORAL_TABLET | Freq: Two times a day (BID) | ORAL | 3 refills | Status: DC

## 2016-10-07 MED ORDER — LEVOTHYROXINE SODIUM 200 MCG PO TABS: ORAL_TABLET | ORAL | 3 refills | Status: DC

## 2016-10-07 MED ORDER — ESCITALOPRAM OXALATE 20 MG PO TABS: 20.0000 mg | ORAL_TABLET | Freq: Every day | ORAL | 2 refills | Status: DC

## 2016-10-07 NOTE — Assessment & Plan Note (Signed)
Increase Lexapro to 20 mg. Discussed the importance of the one-on-one therapy for herself. She agrees to call again next week to set up appointment. We will follow-up with her next week

## 2016-10-07 NOTE — Assessment & Plan Note (Signed)
Diastolic dysfunction. Her peripheral edema does look better compared to 4 weeks ago. Discussed continued to be consistent with her medication elevate the legs she does not wear compression hose.

## 2016-10-07 NOTE — Assessment & Plan Note (Signed)
Controlled. Discussed importance of using her CPAP she cannot find a piece that we need to order another one whenever that may be.

## 2016-10-07 NOTE — Progress Notes (Signed)
   Subjective:    [name], female    DOB: 07/24/1955, 61 y.o.   MRN: [id number]  Patient presents for F/U (is not fasting)  Patient here for interim follow-up. She was seen 4 weeks ago for multiple medical problems. She been noncompliant dealing with a lot of depression and stressors. Agreed to start Lexapro 10 mg which she has been taking daily she has not noticed much difference. She states that she tried to call therapist but isn't playing phone tag just was not scheduled for herself. She did speak with therapist and is seeing one of her grandchildren.  Peripheral edema diastolic dysfunction she has been taking her diuretic and did notice a decrease in her fluid when she took it twice a day. She has now been taking more consistently and Sundays take an extra diuretic or weight is down 3 pounds she can put her shoes on better than previously. She's not had any shortness of breath.  Obstructive sleep apnea she is still not using her CPAP as prescribed she states that she is missing a piece that one of the children to get home. She knows that she needs to find it as her insurance will not cover it.  She still does miss some of her medications during the day because she gets sidetracked at work. Review Of Systems:  GEN- denies fatigue, fever, weight loss,weakness, recent illness HEENT- denies eye drainage, change in vision, nasal discharge, CVS- denies chest pain, palpitations RESP- denies SOB, cough, wheeze ABD- denies N/V, change in stools, abd pain GU- denies dysuria, hematuria, dribbling, incontinence MSK- denies joint pain, muscle aches, injury Neuro- denies headache, dizziness, syncope, seizure activity       Objective:    BP 132/68   Pulse 72   Temp 98.2 F (36.8 C) (Oral)   Resp 14   Ht 5\' 3"  (1.6 m)   Wt 266 lb (120.7 kg)   SpO2 98%   BMI 47.12 kg/m  GEN- NAD, alert and oriented x3 HEENT- PERRL, EOMI, non injected sclera, pink conjunctiva, MMM,  oropharynx clear CVS- RRR, sytolic murmur RESP-CTAB Psych- not anxious appearing, depressed affect, no SI  EXT- 1+ LLE, less than 1+ in RLE   Pulses- Radial, DP- 2+        Assessment & Plan:      Problem List Items Addressed This Visit    Leg edema   Essential hypertension - Primary    Controlled. Discussed importance of using her CPAP she cannot find a piece that we need to order another one whenever that may be.      Diastolic dysfunction    Diastolic dysfunction. Her peripheral edema does look better compared to 4 weeks ago. Discussed continued to be consistent with her medication elevate the legs she does not wear compression hose.      Depression (emotion)    Increase Lexapro to 20 mg. Discussed the importance of the one-on-one therapy for herself. She agrees to call again next week to set up appointment. We will follow-up with her next week      Relevant Medications   escitalopram (LEXAPRO) 20 MG tablet      Note: This dictation was prepared with Dragon dictation along with smaller phrase technology. Any transcriptional errors that result from this process are unintentional.

## 2016-10-07 NOTE — Addendum Note (Signed)
Addended by: Sheral Flow on: 10/07/2016 05:25 PM   Modules accepted: Orders

## 2016-10-07 NOTE — Patient Instructions (Signed)
Call for therapy session Lexapro 20mg  daily  F/U 3 months

## 2017-01-06 ENCOUNTER — Ambulatory Visit (INDEPENDENT_AMBULATORY_CARE_PROVIDER_SITE_OTHER): Payer: BLUE CROSS/BLUE SHIELD | Admitting: Family Medicine

## 2017-01-06 ENCOUNTER — Encounter: Payer: Self-pay | Admitting: Family Medicine

## 2017-01-06 VITALS — BP 130/80 | HR 79 | Temp 98.2°F | Resp 16 | Ht 63.0 in | Wt 279.2 lb

## 2017-01-06 DIAGNOSIS — I519 Heart disease, unspecified: Secondary | ICD-10-CM

## 2017-01-06 DIAGNOSIS — I5189 Other ill-defined heart diseases: Secondary | ICD-10-CM

## 2017-01-06 DIAGNOSIS — F321 Major depressive disorder, single episode, moderate: Secondary | ICD-10-CM

## 2017-01-06 DIAGNOSIS — E038 Other specified hypothyroidism: Secondary | ICD-10-CM | POA: Diagnosis not present

## 2017-01-06 DIAGNOSIS — E1149 Type 2 diabetes mellitus with other diabetic neurological complication: Secondary | ICD-10-CM | POA: Diagnosis not present

## 2017-01-06 DIAGNOSIS — E782 Mixed hyperlipidemia: Secondary | ICD-10-CM | POA: Diagnosis not present

## 2017-01-06 LAB — CBC WITH DIFFERENTIAL/PLATELET
BASOS ABS: 0 {cells}/uL (ref 0–200)
Basophils Relative: 0 %
EOS ABS: 132 {cells}/uL (ref 15–500)
EOS PCT: 2 %
HCT: 40.9 % (ref 35.0–45.0)
HEMOGLOBIN: 12.8 g/dL (ref 12.0–15.0)
LYMPHS ABS: 2112 {cells}/uL (ref 850–3900)
Lymphocytes Relative: 32 %
MCH: 25.7 pg — AB (ref 27.0–33.0)
MCHC: 31.3 g/dL — ABNORMAL LOW (ref 32.0–36.0)
MCV: 82.1 fL (ref 80.0–100.0)
MONO ABS: 528 {cells}/uL (ref 200–950)
MPV: 9.5 fL (ref 7.5–12.5)
Monocytes Relative: 8 %
NEUTROS ABS: 3828 {cells}/uL (ref 1500–7800)
Neutrophils Relative %: 58 %
Platelets: 257 10*3/uL (ref 140–400)
RBC: 4.98 MIL/uL (ref 3.80–5.10)
RDW: 14.3 % (ref 11.0–15.0)
WBC: 6.6 10*3/uL (ref 3.8–10.8)

## 2017-01-06 LAB — T3, FREE: T3, Free: 3 pg/mL (ref 2.3–4.2)

## 2017-01-06 LAB — TSH: TSH: 0.08 mIU/L — ABNORMAL LOW

## 2017-01-06 MED ORDER — ESCITALOPRAM OXALATE 20 MG PO TABS: 20.0000 mg | ORAL_TABLET | Freq: Every day | ORAL | 2 refills | Status: DC

## 2017-01-06 NOTE — Progress Notes (Signed)
Subjective:    [name], female    DOB: [birth year], 61 y.o.   MRN: [id number]  Patient presents for routine follow up   Pt here to f/u chronic medical problems, last visit in May , weight at that visit 266lbs, now at 279lbs.She has diastolic heart failure, also prescribed compression hose  She does get SOB with exertion .She denies any chest pain. She admits that she will sometimes forget her medications including her diuretic and her diabetes medications. States that she is disconcerting with taking care of everyone else in her household.   OSA- prescribed CPAP , she does have some of the pieces but now states that she needs an entire new machine  MDD- increased lexapro to 20mg  once a day at the last visit which she has been taking but she never sought out therapy or counseling even though she knows she would benefit from it.  Recently started Mag/Zinc/Calcium- has pain with walking more and her muscle states that this helps.   DM- last A1C 5.9%  Frances Hughes- daughter keeps her on task- [phone]   Review Of Systems:  GEN- denies fatigue, fever, weight loss,weakness, recent illness HEENT- denies eye drainage, change in vision, nasal discharge, CVS- denies chest pain, palpitations RESP- denies SOB, cough, wheeze ABD- denies N/V, change in stools, abd pain GU- denies dysuria, hematuria, dribbling, incontinence MSK- denies joint pain, muscle aches, injury Neuro- denies headache, dizziness, syncope, seizure activity       Objective:    BP 130/80   Pulse 79   Temp 98.2 F (36.8 C) (Oral)   Resp 16   Ht 5\' 3"  (1.6 m)   Wt 279 lb 3.2 oz (126.6 kg)   SpO2 97%   BMI 49.46 kg/m  GEN- NAD, alert and oriented x3, obese  HEENT- PERRL, EOMI, non injected sclera, pink conjunctiva, MMM, oropharynx clear Neck- Supple, no thyromegaly CVS- RRR,  2/6 SEM  RESP-CTAB ABD-NABS,soft,NT,ND Psych- normal affect and mood  EXT- 1+ edema Pulses- Radial, DP- 2+        Assessment & Plan:      Problem List Items Addressed This Visit      Unprioritized   Hypothyroidism   Relevant Orders   T3, free   TSH   Hyperlipidemia - Primary    Lipids at goal  LDL < 536       Diastolic dysfunction    Medication compliance as well as use of compression hose diet continue to be an issue. She is having now shortness of breath with exertion which I think is related to her fluid retention in her heart failure. Him and get her point and with the cardiology. I commend that she take her diuretics daily as prescribed.      Relevant Orders   Brain natriuretic peptide   Comprehensive metabolic panel   CBC with Differential/Platelet   Diabetes mellitus, type II (Frances Hughes)    Diabetes has been fairly well controlled we'll recheck her A1c today. We discussed her diet she has a snack cart in her office which has chips and cookies and sodas on it which she states for the staff but admits to just eating and snacking all throughout the day even when she is not hungry. Once again I think all of this is emotionally driven but she is not taken the initiative to work on her mental health.      Relevant Orders   Hemoglobin A1c   Depression (emotion)    Continue  the Lexapro as prescribed again recommended that she go see therapist.      Relevant Medications   escitalopram (LEXAPRO) 20 MG tablet      Note: This dictation was prepared with Dragon dictation along with smaller phrase technology. Any transcriptional errors that result from this process are unintentional.

## 2017-01-06 NOTE — Assessment & Plan Note (Signed)
Lipids at goal  LDL < 100

## 2017-01-06 NOTE — Assessment & Plan Note (Signed)
Continue the Lexapro as prescribed again recommended that she go see therapist.

## 2017-01-06 NOTE — Assessment & Plan Note (Addendum)
Medication compliance as well as use of compression hose diet continue to be an issue. She is having now shortness of breath with exertion which I think is related to her fluid retention in her heart failure. Send to   Cardiology. Repeat ECHO? I commend that she take her diuretics daily as prescribed.

## 2017-01-06 NOTE — Patient Instructions (Signed)
Referral to Cardiology  We will call with lab results F/U 4 months

## 2017-01-06 NOTE — Assessment & Plan Note (Signed)
Diabetes has been fairly well controlled we'll recheck her A1c today. We discussed her diet she has a snack cart in her office which has chips and cookies and sodas on it which she states for the staff but admits to just eating and snacking all throughout the day even when she is not hungry. Once again I think all of this is emotionally driven but she is not taken the initiative to work on her mental health.

## 2017-01-07 LAB — COMPREHENSIVE METABOLIC PANEL
ALK PHOS: 91 U/L (ref 33–130)
ALT: 21 U/L (ref 6–29)
AST: 18 U/L (ref 10–35)
Albumin: 3.7 g/dL (ref 3.6–5.1)
BILIRUBIN TOTAL: 0.4 mg/dL (ref 0.2–1.2)
BUN: 19 mg/dL (ref 7–25)
CALCIUM: 9.3 mg/dL (ref 8.6–10.4)
CO2: 31 mmol/L (ref 20–32)
CREATININE: 0.61 mg/dL (ref 0.50–0.99)
Chloride: 101 mmol/L (ref 98–110)
Glucose, Bld: 117 mg/dL — ABNORMAL HIGH (ref 70–99)
Potassium: 3.7 mmol/L (ref 3.5–5.3)
SODIUM: 143 mmol/L (ref 135–146)
TOTAL PROTEIN: 7 g/dL (ref 6.1–8.1)

## 2017-01-07 LAB — HEMOGLOBIN A1C
HEMOGLOBIN A1C: 6 % — AB (ref <5.7)
Mean Plasma Glucose: 126 mg/dL

## 2017-01-07 LAB — BRAIN NATRIURETIC PEPTIDE: Brain Natriuretic Peptide: 9.4 pg/mL (ref <100)

## 2017-01-17 ENCOUNTER — Other Ambulatory Visit: Payer: Self-pay | Admitting: *Deleted

## 2017-01-17 DIAGNOSIS — E038 Other specified hypothyroidism: Secondary | ICD-10-CM

## 2017-01-17 MED ORDER — LEVOTHYROXINE SODIUM 175 MCG PO TABS: 175.0000 ug | ORAL_TABLET | Freq: Every day | ORAL | 1 refills | Status: DC

## 2017-02-13 ENCOUNTER — Other Ambulatory Visit: Payer: Self-pay | Admitting: Family Medicine

## 2017-02-13 ENCOUNTER — Other Ambulatory Visit: Payer: Self-pay

## 2017-02-13 DIAGNOSIS — E038 Other specified hypothyroidism: Secondary | ICD-10-CM | POA: Diagnosis not present

## 2017-02-13 LAB — T4, FREE: FREE T4: 1.3 ng/dL (ref 0.8–1.8)

## 2017-02-13 LAB — TSH: TSH: 1.27 m[IU]/L (ref 0.40–4.50)

## 2017-02-21 ENCOUNTER — Encounter: Payer: Self-pay | Admitting: *Deleted

## 2017-04-29 ENCOUNTER — Other Ambulatory Visit: Payer: Self-pay | Admitting: Family Medicine

## 2017-05-30 ENCOUNTER — Ambulatory Visit (HOSPITAL_COMMUNITY)
Admission: RE | Admit: 2017-05-30 | Discharge: 2017-05-30 | Disposition: A | Discharge status: Home / Self Care | Payer: BLUE CROSS/BLUE SHIELD | Source: Ambulatory Visit | Attending: Family Medicine | Admitting: Family Medicine

## 2017-05-30 ENCOUNTER — Other Ambulatory Visit: Payer: Self-pay

## 2017-05-30 ENCOUNTER — Ambulatory Visit: Payer: BLUE CROSS/BLUE SHIELD | Admitting: Family Medicine

## 2017-05-30 ENCOUNTER — Encounter: Payer: Self-pay | Admitting: Family Medicine

## 2017-05-30 VITALS — BP 128/76 | HR 82 | Temp 98.0°F | Resp 16 | Ht 63.0 in | Wt 289.0 lb

## 2017-05-30 DIAGNOSIS — I83891 Varicose veins of right lower extremities with other complications: Secondary | ICD-10-CM | POA: Insufficient documentation

## 2017-05-30 DIAGNOSIS — I10 Essential (primary) hypertension: Secondary | ICD-10-CM

## 2017-05-30 DIAGNOSIS — I8391 Asymptomatic varicose veins of right lower extremity: Secondary | ICD-10-CM | POA: Diagnosis not present

## 2017-05-30 DIAGNOSIS — R600 Localized edema: Secondary | ICD-10-CM

## 2017-05-30 DIAGNOSIS — G4733 Obstructive sleep apnea (adult) (pediatric): Secondary | ICD-10-CM | POA: Diagnosis not present

## 2017-05-30 DIAGNOSIS — I519 Heart disease, unspecified: Secondary | ICD-10-CM

## 2017-05-30 DIAGNOSIS — E1149 Type 2 diabetes mellitus with other diabetic neurological complication: Secondary | ICD-10-CM | POA: Diagnosis not present

## 2017-05-30 DIAGNOSIS — I5189 Other ill-defined heart diseases: Secondary | ICD-10-CM

## 2017-05-30 NOTE — Patient Instructions (Addendum)
Ultrasound to be done  Take lasix every day Stop the chlorthalidone  Forestine Na 2:15pm  Use heat to area Sleep study to be done We will call with lab results  F/U 3 months

## 2017-05-30 NOTE — Assessment & Plan Note (Signed)
D/c chlorathalidone, take lasix 40mg  every day Wear compression hose

## 2017-05-30 NOTE — Assessment & Plan Note (Signed)
controlled 

## 2017-05-30 NOTE — Assessment & Plan Note (Addendum)
Check A1C on Metformin, needs significant weight loss Which we have discussed multiple times  Concern for superficial thrombus or phlebitis, obtain US of leg, may need vascular Advised to wear compression hose Warm compresses, NSAID- she prefers ASA- advised 325mg 

## 2017-05-30 NOTE — Assessment & Plan Note (Addendum)
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

## 2017-05-30 NOTE — Progress Notes (Signed)
Subjective:    [name], female    DOB: [birth year], 62 y.o.   MRN: [id number]  Patient presents for R Leg Pain (states that she has raised vein in back of leg- reports increased pain with walking and vien is more prominent then)  Past few weeks has had pain of a vein on right popliteal region that radiates to thigh, Sunday became very painfl and throbbing could not walk. Still has pain and discomfort.  Not noted any significant change in her peripheral edema but admits that she has not been wearing her compression hose she still skips days of taking her Lasix but does take her chlorthalidone.  As always fatigued and has gained 10 pounds since her last visit in August.  She has not been checking her blood sugars though her last A1c was at 6% in August.  She has sleep apnea she has not been wearing her CPAP states that she was told she needs a new when they would not replace the one piece but she has been so busy that she did not have anything done.  She now has both of her daughters plus a total of 5 children living with her and her husband and things are a little stressful at times states that she is often sitting around caring for the children and not being very active.   Review Of Systems:  GEN- + fatigue, fever, weight loss,weakness, recent illness HEENT- denies eye drainage, change in vision, nasal discharge, CVS- denies chest pain, palpitations RESP- denies SOB, cough, wheeze ABD- denies N/V, change in stools, abd pain GU- denies dysuria, hematuria, dribbling, incontinence MSK- + joint pain, muscle aches, injury Neuro- denies headache, dizziness, syncope, seizure activity       Objective:    BP 128/76   Pulse 82   Temp 98 F (36.7 C) (Oral)   Resp 16   Ht 5\' 3"  (1.6 m)   Wt 289 lb (131.1 kg)   SpO2 97%   BMI 51.19 kg/m  GEN- NAD, alert and oriented x3,obese  HEENT- PERRL, EOMI, non injected sclera, pink conjunctiva, MMM, oropharynx clear Neck-  Supple, no thyromegaly CVS- RRR,  2/6 SEM  RESP-CTAB ABD-NABS,soft,NT,ND Psych- normal affect and mood  EXT- 1+ edema , right leg palpale varicose vein, hardening in middle running from medial popliteal to medial thigh 3-4 inches long, TTP, no erythema , no warmth  Pulses- Radial 2+, feet warm to touch        Assessment & Plan:      Problem List Items Addressed This Visit      Unprioritized   Leg edema   Obstructive sleep apnea    She needs split night study done, to get her a new machine and restart CPAP Therapy I thinkh er fatigue is MTF but significant due to obesity and untreated OSA/ with likely obesity hypoventilation      Essential hypertension - Primary    controlled      Relevant Orders   CBC with Differential/Platelet   Comprehensive metabolic panel   Diastolic dysfunction    D/c chlorathalidone, take lasix 40mg  every day Wear compression hose      Diabetes mellitus, type II (HCC)    Check A1C on Metformin, needs significant weight loss Which we have discussed multiple times  Concern for superficial thrombus or phlebitis, obtain US of leg, may need vascular Advised to wear compression hose Warm compresses, NSAID- she prefers ASA- advised 325mg   Relevant Orders   Hemoglobin A1c    Other Visit Diagnoses    Varicose veins of right lower extremity, unspecified whether complicated       Relevant Orders   US Venous Img Lower Unilateral Right      Note: This dictation was prepared with Dragon dictation along with smaller phrase technology. Any transcriptional errors that result from this process are unintentional.

## 2017-05-31 LAB — CBC WITH DIFFERENTIAL/PLATELET
BASOS PCT: 0.4 %
Basophils Absolute: 34 cells/uL (ref 0–200)
EOS ABS: 170 {cells}/uL (ref 15–500)
Eosinophils Relative: 2 %
HCT: 40.6 % (ref 35.0–45.0)
HEMOGLOBIN: 12.9 g/dL (ref 11.7–15.5)
Lymphs Abs: 2975 cells/uL (ref 850–3900)
MCH: 25.2 pg — AB (ref 27.0–33.0)
MCHC: 31.8 g/dL — ABNORMAL LOW (ref 32.0–36.0)
MCV: 79.3 fL — AB (ref 80.0–100.0)
MPV: 10.1 fL (ref 7.5–12.5)
Monocytes Relative: 6.3 %
NEUTROS ABS: 4786 {cells}/uL (ref 1500–7800)
Neutrophils Relative %: 56.3 %
Platelets: 291 10*3/uL (ref 140–400)
RBC: 5.12 10*6/uL — ABNORMAL HIGH (ref 3.80–5.10)
RDW: 14.3 % (ref 11.0–15.0)
Total Lymphocyte: 35 %
WBC: 8.5 10*3/uL (ref 3.8–10.8)
WBCMIX: 536 {cells}/uL (ref 200–950)

## 2017-05-31 LAB — COMPREHENSIVE METABOLIC PANEL
AG RATIO: 1 (calc) (ref 1.0–2.5)
ALT: 17 U/L (ref 6–29)
AST: 23 U/L (ref 10–35)
Albumin: 3.7 g/dL (ref 3.6–5.1)
Alkaline phosphatase (APISO): 76 U/L (ref 33–130)
BUN: 17 mg/dL (ref 7–25)
CHLORIDE: 99 mmol/L (ref 98–110)
CO2: 34 mmol/L — AB (ref 20–32)
Calcium: 9.5 mg/dL (ref 8.6–10.4)
Creat: 0.83 mg/dL (ref 0.50–0.99)
GLOBULIN: 3.7 g/dL (ref 1.9–3.7)
Glucose, Bld: 102 mg/dL — ABNORMAL HIGH (ref 65–99)
Potassium: 3.4 mmol/L — ABNORMAL LOW (ref 3.5–5.3)
Sodium: 143 mmol/L (ref 135–146)
Total Bilirubin: 0.3 mg/dL (ref 0.2–1.2)
Total Protein: 7.4 g/dL (ref 6.1–8.1)

## 2017-05-31 LAB — HEMOGLOBIN A1C
Hgb A1c MFr Bld: 6.4 % of total Hgb — ABNORMAL HIGH (ref <5.7)
Mean Plasma Glucose: 137 (calc)
eAG (mmol/L): 7.6 (calc)

## 2017-06-02 ENCOUNTER — Telehealth: Payer: Self-pay | Admitting: *Deleted

## 2017-06-02 NOTE — Telephone Encounter (Signed)
Call placed to Terri and authorization number provided.

## 2017-06-02 NOTE — Telephone Encounter (Signed)
Received call from Edna, Merchant navy officer at [facility] [phone] telephone.   Reports that Peer to Peer review is required for split night sleep study.   [employer] AIM: [phone] telephone  Member ID: [id number].  Call placed to AIM. Was advised that clinical documentation can be given by nurse for AIM MD to review on Monday, but Peer to Peer can only be done by ordering provider.   Clinical documentation given as follows: Patient has untreated OSA and requires new sleep study to resume CPAP therapy.  Multifactorial fatigue significant for obesity and likely hypoventilation.   MD to be made aware.

## 2017-06-02 NOTE — Telephone Encounter (Signed)
Authorization # is  [id number], Exp March 11,2019

## 2017-06-22 ENCOUNTER — Ambulatory Visit: Payer: Self-pay | Admitting: Physician Assistant

## 2017-06-22 ENCOUNTER — Telehealth: Payer: Self-pay | Admitting: *Deleted

## 2017-06-22 ENCOUNTER — Encounter: Payer: Self-pay | Admitting: Physician Assistant

## 2017-06-22 ENCOUNTER — Ambulatory Visit: Payer: BLUE CROSS/BLUE SHIELD | Admitting: Physician Assistant

## 2017-06-22 ENCOUNTER — Other Ambulatory Visit: Payer: Self-pay

## 2017-06-22 VITALS — BP 130/70 | HR 111 | Temp 99.8°F | Resp 18 | Wt 293.8 lb

## 2017-06-22 DIAGNOSIS — R6889 Other general symptoms and signs: Secondary | ICD-10-CM | POA: Diagnosis not present

## 2017-06-22 DIAGNOSIS — J988 Other specified respiratory disorders: Secondary | ICD-10-CM | POA: Diagnosis not present

## 2017-06-22 DIAGNOSIS — B9789 Other viral agents as the cause of diseases classified elsewhere: Secondary | ICD-10-CM | POA: Diagnosis not present

## 2017-06-22 LAB — INFLUENZA A AND B AG, IMMUNOASSAY
INFLUENZA A ANTIGEN: NOT DETECTED
INFLUENZA B ANTIGEN: NOT DETECTED

## 2017-06-22 NOTE — Telephone Encounter (Signed)
Received call from patient daughter, [name].   Reports that patient has become ill and is having SOB. Advised to bring to office foe evaluation.    Also reports that patient has split night sleep study scheduled for 8pm at [facility]. Appointment for sleep study cancelled. Patient can re-schedule after illness.

## 2017-06-22 NOTE — Progress Notes (Signed)
[name] MRN: [id number], DOB: [birth year], 62 y.o. Date of Encounter: 06/22/2017, 1:03 PM    Chief Complaint:  Chief Complaint  Patient presents with  . Headache    x2days  . Cough  . bodyaches  . sinus pressure     HPI: 62 y.o. year old female presents with above.   She reports that she has been having these symptoms for 2 days.  Reports that she is having headache and body aches.  Says that her "hips hurt."  Also is having some cough.  Says that her head feels worse than her chest.  I asked if she had been around any known sick contacts.  States that her grandchildren are in daycare and are constantly getting sick and coughing and sneezing on her.  Her coworkers have been sick but no specific illness/exposure.  Temperature reading 99.8 here.  Has had no other known fevers or chills.     Home Meds:   Outpatient Medications Prior to Visit  Medication Sig Dispense Refill  . amLODipine (NORVASC) 10 MG tablet Take 1 tablet (10 mg total) by mouth daily. 90 tablet 3  . aspirin 81 MG tablet Take 1 tablet (81 mg total) by mouth daily. 30 tablet 1  . Blood Glucose Monitoring Suppl (BLOOD GLUCOSE MONITOR SYSTEM) W/DEVICE KIT Please dispense based on patient and insurance preference. Use to monitor fasting blood sugar 1x daily. Dx: E11.9 1 each 0  . carvedilol (COREG) 25 MG tablet Take 1 tablet (25 mg total) by mouth 2 (two) times daily. 180 tablet 3  . escitalopram (LEXAPRO) 20 MG tablet Take 1 tablet (20 mg total) by mouth at bedtime. 90 tablet 2  . furosemide (LASIX) 40 MG tablet TAKE (1) TABLET BY MOUTH ONCE DAILY AS NEEDED. 90 tablet 3  . Glucose Blood (BLOOD GLUCOSE TEST STRIPS) STRP Please dispense based on patient and insurance preference. Use to monitor fasting blood sugar 1x daily. Dx: E11.9 100 each 0  . Lancets 28G MISC Please dispense based on patient and insurance preference. Use to monitor fasting blood sugar 1x daily. Dx: E11.9 100 each 0  .  levothyroxine (SYNTHROID, LEVOTHROID) 175 MCG tablet TAKE 1 TABLET BY MOUTH ONCE DAILY BEFORE BREAKFAST. 30 tablet 0  . losartan (COZAAR) 100 MG tablet TAKE (1) TABLET BY MOUTH DAILY FOR HIGH BLOOD PRESSURE. 90 tablet 3  . metFORMIN (GLUCOPHAGE) 500 MG tablet Take 1 tablet (500 mg total) by mouth 2 (two) times daily with a meal. 180 tablet 3  . Multiple Vitamins-Minerals (CENTRUM SILVER PO) Take 1 tablet by mouth daily.    . potassium chloride SA (K-DUR,KLOR-CON) 20 MEQ tablet Take 1 tablet (20 mEq total) by mouth daily. 90 tablet 3   No facility-administered medications prior to visit.     Allergies: No Known Allergies    Review of Systems: See HPI for pertinent ROS. All other ROS negative.    Physical Exam: Blood pressure 130/70, pulse (!) 111, temperature 99.8 F (37.7 C), temperature source Oral, resp. rate 18, weight 133.3 kg (293 lb 12.8 oz), SpO2 94 %., Body mass index is 52.04 kg/m. General:  Obese AAF.  Appears in no acute distress. HEENT: Normocephalic, atraumatic, eyes without discharge, sclera non-icteric, nares are without discharge. Bilateral auditory canals clear, TM's are without perforation, pearly grey and translucent with reflective cone of light bilaterally. Oral cavity moist, posterior pharynx without exudate, erythema, peritonsillar abscess.  Neck: Supple. No thyromegaly. No lymphadenopathy. Lungs: Clear bilaterally to auscultation  without wheezes, rales, or rhonchi. Breathing is unlabored. Heart: Regular rhythm. No murmurs, rubs, or gallops. Msk:  Strength and tone normal for age. Extremities/Skin: Warm and dry.  Neuro: Alert and oriented X 3. Moves all extremities spontaneously. Gait is normal. CNII-XII grossly in tact. Psych:  Responds to questions appropriately with a normal affect.     ASSESSMENT AND PLAN:  62 y.o. year old female with   1. Viral respiratory infection Discussed that test is negative for flu.  Discussed that at this point illness is  consistent with viral illness which means that should run its course and resolve on its own.  In the interim she can use over-the-counter medications for symptom relief.  Note given to cover missing some work yesterday and being out of work today.  she is then scheduled to be off tomorrow in the weekend.  During that time she needs to get plenty of rest and stay home to avoid spreading germs.  If symptoms worsen significantly or persist > 7 -10 days,  then follow-up.  2. Flu-like symptoms - Influenza A and B Ag, Immunoassay     Signed, [address] [place], [place], [facility] 06/22/2017 1:03 PM

## 2017-08-21 ENCOUNTER — Ambulatory Visit: Payer: BLUE CROSS/BLUE SHIELD | Admitting: Family Medicine

## 2017-08-21 ENCOUNTER — Encounter: Payer: Self-pay | Admitting: Family Medicine

## 2017-08-21 ENCOUNTER — Other Ambulatory Visit: Payer: Self-pay

## 2017-08-21 VITALS — BP 138/82 | HR 90 | Temp 98.3°F | Resp 16 | Ht 63.0 in | Wt 294.0 lb

## 2017-08-21 DIAGNOSIS — E1149 Type 2 diabetes mellitus with other diabetic neurological complication: Secondary | ICD-10-CM

## 2017-08-21 DIAGNOSIS — I5189 Other ill-defined heart diseases: Secondary | ICD-10-CM | POA: Diagnosis not present

## 2017-08-21 DIAGNOSIS — R600 Localized edema: Secondary | ICD-10-CM | POA: Diagnosis not present

## 2017-08-21 DIAGNOSIS — R0600 Dyspnea, unspecified: Secondary | ICD-10-CM

## 2017-08-21 DIAGNOSIS — E038 Other specified hypothyroidism: Secondary | ICD-10-CM | POA: Diagnosis not present

## 2017-08-21 DIAGNOSIS — R0609 Other forms of dyspnea: Secondary | ICD-10-CM | POA: Diagnosis not present

## 2017-08-21 DIAGNOSIS — F321 Major depressive disorder, single episode, moderate: Secondary | ICD-10-CM | POA: Diagnosis not present

## 2017-08-21 NOTE — Patient Instructions (Signed)
Increase lasix to 40mg  twice a day for a week, take with potassium Referral to cardiology Call [facility] [phone]

## 2017-08-21 NOTE — Progress Notes (Signed)
Subjective:    [name], female    DOB: [birth year], 62 y.o.   MRN: [id number]  Patient presents for SOB on Exertion (states that she has increased SOB with exertion) Patient here worsening shortness of breath for the past week.  She is history of diastolic dysfunction morbid obesity hypothyroidism.  She has not had the best compliant with her diuretics but states that the past week she has been taking regularly.  She went on a trip with some coworkers and had severe shortness of breath trying to walk up the hill.  Felt like her heart was jumping out of her chest.  She has not had any significant cough or congestion.  She gets easily winded even when she is playing with her grandchildren or get into the bathtub.   Did suffer with grief from the death of her son the anniversary was on March 30.  She is currently seeing a counselor but does not feel like she connected with them well.  She knows she is having difficulty moving past her grieving staying therefore not taking care of herself. She is taking lexapro   Review Of Systems:  GEN- denies fatigue, fever, weight loss,weakness, recent illness HEENT- denies eye drainage, change in vision, nasal discharge, CVS- denies chest pain, palpitations RESP- denies SOB, cough, wheeze ABD- denies N/V, change in stools, abd pain GU- denies dysuria, hematuria, dribbling, incontinence MSK- denies joint pain, muscle aches, injury Neuro- denies headache, dizziness, syncope, seizure activity       Objective:    BP 138/82   Pulse 90   Temp 98.3 F (36.8 C) (Oral)   Resp 16   Ht 5\' 3"  (1.6 m)   Wt 294 lb (133.4 kg)   SpO2 94%   BMI 52.08 kg/m  GEN- NAD, alert and oriented x3,weight up 5lbs since Jan HEENT- PERRL, EOMI, non injected sclera, pink conjunctiva, MMM, oropharynx clear Neck- Supple, no thyromegaly, no JVD CVS- RRR, no murmur RESP-CTAB ABD-NABS,soft,NT,ND EXT- 1+ pitting edema Pulses- Radial, - 2+  EKG-  NSR      Assessment & Plan:      Problem List Items Addressed This Visit      Unprioritized   Leg edema - Primary   Relevant Orders   Brain natriuretic peptide   Ambulatory referral to Cardiology   Hypothyroidism   Relevant Orders   T3, free   TSH   T4, free   Diabetes mellitus, type II ([place])   Relevant Orders   Hemoglobin G8J   Diastolic dysfunction    Concerned that her morbid obesity has led to worsening heart failure especially with her dyspnea on exertion which is likely multifactorial in general.  I am to check a BNP on her.  Her EKG was unremarkable in the office.  She has significant edema on lower extremities.  She does not adhere to a good diet.  Increase her Lasix to 40 mg twice a day with the potassium.  I think that she needs a another echocardiogram.  I would get her set up with cardiology to have this arranged.  I will check her other risk factors including diabetes and hypothyroidism to make sure these are not metabolically contributing.      Relevant Orders   CBC with Differential/Platelet   Comprehensive metabolic panel   Brain natriuretic peptide   EKG 12-Lead (Completed)   Ambulatory referral to Cardiology   Depression (emotion)    Her depression and prolonged grief is definitely interfering  with her being able to live her life and take care of herself.  I have given her recommendations for a Christian counselor her think that she may benefit from.  She is going to continue the Lexapro.       Other Visit Diagnoses    DOE (dyspnea on exertion)       Relevant Orders   Ambulatory referral to Cardiology      Note: This dictation was prepared with Dragon dictation along with smaller phrase technology. Any transcriptional errors that result from this process are unintentional.

## 2017-08-21 NOTE — Assessment & Plan Note (Signed)
Concerned that her morbid obesity has led to worsening heart failure especially with her dyspnea on exertion which is likely multifactorial in general.  I am to check a BNP on her.  Her EKG was unremarkable in the office.  She has significant edema on lower extremities.  She does not adhere to a good diet.  Increase her Lasix to 40 mg twice a day with the potassium.  I think that she needs a another echocardiogram.  I would get her set up with cardiology to have this arranged.  I will check her other risk factors including diabetes and hypothyroidism to make sure these are not metabolically contributing.

## 2017-08-21 NOTE — Assessment & Plan Note (Signed)
Her depression and prolonged grief is definitely interfering with her being able to live her life and take care of herself.  I have given her recommendations for a Christian counselor her think that she may benefit from.  She is going to continue the Lexapro.

## 2017-08-22 LAB — COMPREHENSIVE METABOLIC PANEL
AG Ratio: 1.2 (calc) (ref 1.0–2.5)
ALBUMIN MSPROF: 3.9 g/dL (ref 3.6–5.1)
ALT: 24 U/L (ref 6–29)
AST: 22 U/L (ref 10–35)
Alkaline phosphatase (APISO): 74 U/L (ref 33–130)
BUN: 16 mg/dL (ref 7–25)
CO2: 36 mmol/L — ABNORMAL HIGH (ref 20–32)
CREATININE: 0.72 mg/dL (ref 0.50–0.99)
Calcium: 9.3 mg/dL (ref 8.6–10.4)
Chloride: 98 mmol/L (ref 98–110)
GLUCOSE: 105 mg/dL — AB (ref 65–99)
Globulin: 3.3 g/dL (calc) (ref 1.9–3.7)
POTASSIUM: 3.9 mmol/L (ref 3.5–5.3)
SODIUM: 144 mmol/L (ref 135–146)
TOTAL PROTEIN: 7.2 g/dL (ref 6.1–8.1)
Total Bilirubin: 0.3 mg/dL (ref 0.2–1.2)

## 2017-08-22 LAB — T4, FREE: Free T4: 0.9 ng/dL (ref 0.8–1.8)

## 2017-08-22 LAB — CBC WITH DIFFERENTIAL/PLATELET
BASOS PCT: 0.6 %
Basophils Absolute: 53 cells/uL (ref 0–200)
EOS PCT: 2.4 %
Eosinophils Absolute: 214 cells/uL (ref 15–500)
HCT: 38.8 % (ref 35.0–45.0)
Hemoglobin: 12.9 g/dL (ref 11.7–15.5)
Lymphs Abs: 3160 cells/uL (ref 850–3900)
MCH: 26.7 pg — ABNORMAL LOW (ref 27.0–33.0)
MCHC: 33.2 g/dL (ref 32.0–36.0)
MCV: 80.3 fL (ref 80.0–100.0)
MONOS PCT: 5.2 %
MPV: 10.8 fL (ref 7.5–12.5)
Neutro Abs: 5011 cells/uL (ref 1500–7800)
Neutrophils Relative %: 56.3 %
PLATELETS: 301 10*3/uL (ref 140–400)
RBC: 4.83 10*6/uL (ref 3.80–5.10)
RDW: 13.1 % (ref 11.0–15.0)
TOTAL LYMPHOCYTE: 35.5 %
WBC: 8.9 10*3/uL (ref 3.8–10.8)
WBCMIX: 463 {cells}/uL (ref 200–950)

## 2017-08-22 LAB — HEMOGLOBIN A1C
EAG (MMOL/L): 7.6 (calc)
HEMOGLOBIN A1C: 6.4 %{Hb} — AB (ref <5.7)
Mean Plasma Glucose: 137 (calc)

## 2017-08-22 LAB — BRAIN NATRIURETIC PEPTIDE: BRAIN NATRIURETIC PEPTIDE: 21 pg/mL (ref <100)

## 2017-08-22 LAB — TSH: TSH: 8.86 m[IU]/L — AB (ref 0.40–4.50)

## 2017-08-22 LAB — T3, FREE: T3 FREE: 2 pg/mL — AB (ref 2.3–4.2)

## 2017-08-24 ENCOUNTER — Other Ambulatory Visit: Payer: Self-pay | Admitting: *Deleted

## 2017-08-24 MED ORDER — LEVOTHYROXINE SODIUM 200 MCG PO TABS: 200.0000 ug | ORAL_TABLET | Freq: Every day | ORAL | 3 refills | Status: DC

## 2017-09-19 ENCOUNTER — Telehealth: Payer: Self-pay | Admitting: *Deleted

## 2017-09-19 NOTE — Telephone Encounter (Signed)
Received VM from patient daughter.  Reports that family is in [place] for vacation. Reports that patient has been noted to have been SOB after minor exertion. Also reports that patient is voicing C/O fatigue. States that she has also been "loopy". Requested information on cardiology referral.   Call placed to patient, and patient daughter [name] with no answer. LM on VM that patient needs to go to ER for eval.

## 2017-09-29 ENCOUNTER — Ambulatory Visit: Payer: Self-pay | Admitting: Cardiology

## 2017-10-13 ENCOUNTER — Ambulatory Visit: Payer: Self-pay | Admitting: Cardiovascular Disease

## 2017-10-13 ENCOUNTER — Encounter

## 2017-10-13 ENCOUNTER — Encounter: Payer: Self-pay | Admitting: Family Medicine

## 2017-10-13 ENCOUNTER — Ambulatory Visit (INDEPENDENT_AMBULATORY_CARE_PROVIDER_SITE_OTHER): Payer: BLUE CROSS/BLUE SHIELD | Admitting: Family Medicine

## 2017-10-13 ENCOUNTER — Other Ambulatory Visit: Payer: Self-pay

## 2017-10-13 VITALS — BP 136/80 | HR 76 | Temp 98.4°F | Resp 14 | Ht 63.0 in | Wt 293.0 lb

## 2017-10-13 DIAGNOSIS — Z1231 Encounter for screening mammogram for malignant neoplasm of breast: Secondary | ICD-10-CM

## 2017-10-13 DIAGNOSIS — Z0000 Encounter for general adult medical examination without abnormal findings: Secondary | ICD-10-CM | POA: Diagnosis not present

## 2017-10-13 DIAGNOSIS — I5189 Other ill-defined heart diseases: Secondary | ICD-10-CM | POA: Diagnosis not present

## 2017-10-13 DIAGNOSIS — E038 Other specified hypothyroidism: Secondary | ICD-10-CM | POA: Diagnosis not present

## 2017-10-13 DIAGNOSIS — G4733 Obstructive sleep apnea (adult) (pediatric): Secondary | ICD-10-CM

## 2017-10-13 DIAGNOSIS — I10 Essential (primary) hypertension: Secondary | ICD-10-CM | POA: Diagnosis not present

## 2017-10-13 DIAGNOSIS — Z1239 Encounter for other screening for malignant neoplasm of breast: Secondary | ICD-10-CM

## 2017-10-13 DIAGNOSIS — E1149 Type 2 diabetes mellitus with other diabetic neurological complication: Secondary | ICD-10-CM | POA: Diagnosis not present

## 2017-10-13 DIAGNOSIS — Z114 Encounter for screening for human immunodeficiency virus [HIV]: Secondary | ICD-10-CM

## 2017-10-13 DIAGNOSIS — F321 Major depressive disorder, single episode, moderate: Secondary | ICD-10-CM

## 2017-10-13 NOTE — Patient Instructions (Addendum)
Split night sleep study to be rescheduled We will f/u 1 week for therapy number  Cardiology on June 5th  Shingrix sent to pharmacy  Schedule your mammogram Release of record- [facility] We will call with blood work results  F/U 2 months

## 2017-10-13 NOTE — Progress Notes (Signed)
Subjective:    [name], female    DOB: May 20, 1956, 62 y.o.   MRN: [id number]  Patient presents for CPE (is fasting)  Pt here for CPE  She has not followed up with cardiology, see phone notes from daghter, states cardiology has rescheduled on her multiple times. She continues to have SOB, she is taking meds regulary now, edema goes up and down, no chest pain  She knows she is not taking care of herself like she should, she lives to take care of her grandchildren which is a lot but the only thing keeping her going. She is tearful and depressed all the time. Does not sleep well. Did not reschedule her sleep study     Hypotyroidism- synthroid was changed to 217mcg in April, due for rpeat    DM- A1C in April 6.4%    Due for colonoscopy Due for Hep C/HIV screening  Due for eye exam  Mammogram- Due     Review Of Systems:  GEN- denies fatigue, fever, weight loss,weakness, recent illness HEENT- denies eye drainage, change in vision, nasal discharge, CVS- denies chest pain, palpitations RESP- denies SOB, cough, wheeze ABD- denies N/V, change in stools, abd pain GU- denies dysuria, hematuria, dribbling, incontinence MSK- denies joint pain, muscle aches, injury Neuro- denies headache, dizziness, syncope, seizure activity       Objective:    BP 136/80   Pulse 76   Temp 98.4 F (36.9 C) (Oral)   Resp 14   Ht 5\' 3"  (1.6 m)   Wt 293 lb (132.9 kg)   SpO2 96%   BMI 51.90 kg/m  GEN- NAD, alert and oriented x3,weight up 5lbs since Jan HEENT- PERRL, EOMI, non injected sclera, pink conjunctiva, MMM, oropharynx clear Neck- Supple, no thyromegaly, no JVD CVS- RRR, no murmur RESP-CTAB ABD-NABS,soft,NT,ND Psych- depressed affect, tearful, not anxious, no SI  EXT- 1+ pitting edema Pulses- Radial, - 2+        Assessment & Plan:      Problem List Items Addressed This Visit      Unprioritized   Hypothyroidism   Relevant Orders   TSH (Completed)   T3, free (Completed)   T4, free (Completed)   Obstructive sleep apnea   Diastolic dysfunction   Depression (emotion)   Routine general medical examination at a health care facility - Primary    CPE done, she is due for mammogram which she can schedule for the future, as well as colonoscopy, but we need to focus on her depression, her OSA, her diastolic heart failure which has decompensated She has cardiology scheduled for 1st week of June Will reschedule her sleep study to get her CPAP on correct settings Recheck TFT for her thyroid supplement Continue lexapro, discussed importance of therapy for grief, ongoing issues, she has a specific place/group she wants to join but did not have contact info with her, advised will give her 1 week, we will call need name and contact info, to help ensure she is following through As we have discussed this quite a few times      Relevant Orders   MM 3D SCREEN BREAST BILATERAL   Hepatitis C antibody (Completed)   Comprehensive metabolic panel (Completed)   Lipid panel (Completed)   Essential hypertension    Controlled, continue duiretic       Diabetes mellitus, type II ([place])    Recheck A1C, goal < 7% Taking MTF      Relevant Orders   Comprehensive metabolic  panel (Completed)   Lipid panel (Completed)    Other Visit Diagnoses    Breast cancer screening       Relevant Orders   MM 3D SCREEN BREAST BILATERAL   Encounter for screening for HIV       Relevant Orders   HIV antibody (Completed)      Note: This dictation was prepared with Dragon dictation along with smaller phrase technology. Any transcriptional errors that result from this process are unintentional.

## 2017-10-16 ENCOUNTER — Encounter: Payer: Self-pay | Admitting: Family Medicine

## 2017-10-16 LAB — HEPATITIS C ANTIBODY
Hepatitis C Ab: NONREACTIVE
SIGNAL TO CUT-OFF: 0.92 (ref <1.00)

## 2017-10-16 LAB — COMPREHENSIVE METABOLIC PANEL
AG RATIO: 1.1 (calc) (ref 1.0–2.5)
ALBUMIN MSPROF: 3.7 g/dL (ref 3.6–5.1)
ALKALINE PHOSPHATASE (APISO): 81 U/L (ref 33–130)
ALT: 20 U/L (ref 6–29)
AST: 21 U/L (ref 10–35)
BUN: 14 mg/dL (ref 7–25)
CHLORIDE: 100 mmol/L (ref 98–110)
CO2: 33 mmol/L — AB (ref 20–32)
Calcium: 9.1 mg/dL (ref 8.6–10.4)
Creat: 0.69 mg/dL (ref 0.50–0.99)
GLOBULIN: 3.3 g/dL (ref 1.9–3.7)
Glucose, Bld: 110 mg/dL — ABNORMAL HIGH (ref 65–99)
POTASSIUM: 4 mmol/L (ref 3.5–5.3)
Sodium: 145 mmol/L (ref 135–146)
Total Bilirubin: 0.4 mg/dL (ref 0.2–1.2)
Total Protein: 7 g/dL (ref 6.1–8.1)

## 2017-10-16 LAB — EXTRA LAV TOP TUBE

## 2017-10-16 LAB — LIPID PANEL
CHOL/HDL RATIO: 3.1 (calc) (ref <5.0)
CHOLESTEROL: 182 mg/dL (ref <200)
HDL: 58 mg/dL (ref >50)
LDL Cholesterol (Calc): 108 mg/dL (calc) — ABNORMAL HIGH
Non-HDL Cholesterol (Calc): 124 mg/dL (calc) (ref <130)
Triglycerides: 71 mg/dL (ref <150)

## 2017-10-16 LAB — HIV ANTIBODY (ROUTINE TESTING W REFLEX): HIV 1&2 Ab, 4th Generation: NONREACTIVE

## 2017-10-16 LAB — T4, FREE: FREE T4: 0.9 ng/dL (ref 0.8–1.8)

## 2017-10-16 LAB — T3, FREE: T3 FREE: 1.9 pg/mL — AB (ref 2.3–4.2)

## 2017-10-16 LAB — TSH: TSH: 13.29 mIU/L — ABNORMAL HIGH (ref 0.40–4.50)

## 2017-10-16 NOTE — Assessment & Plan Note (Signed)
CPE done, she is due for mammogram which she can schedule for the future, as well as colonoscopy, but we need to focus on her depression, her OSA, her diastolic heart failure which has decompensated She has cardiology scheduled for 1st week of June Will reschedule her sleep study to get her CPAP on correct settings Recheck TFT for her thyroid supplement Continue lexapro, discussed importance of therapy for grief, ongoing issues, she has a specific place/group she wants to join but did not have contact info with her, advised will give her 1 week, we will call need name and contact info, to help ensure she is following through As we have discussed this quite a few times

## 2017-10-16 NOTE — Assessment & Plan Note (Signed)
Controlled, continue duiretic

## 2017-10-16 NOTE — Assessment & Plan Note (Signed)
Recheck A1C, goal < 7% Taking MTF

## 2017-10-25 ENCOUNTER — Ambulatory Visit: Payer: BLUE CROSS/BLUE SHIELD | Admitting: Cardiology

## 2017-10-25 ENCOUNTER — Encounter: Payer: Self-pay | Admitting: Cardiology

## 2017-10-25 VITALS — BP 173/101 | HR 86 | Ht 63.0 in | Wt 296.8 lb

## 2017-10-25 DIAGNOSIS — R600 Localized edema: Secondary | ICD-10-CM

## 2017-10-25 DIAGNOSIS — I10 Essential (primary) hypertension: Secondary | ICD-10-CM | POA: Diagnosis not present

## 2017-10-25 DIAGNOSIS — R0609 Other forms of dyspnea: Secondary | ICD-10-CM | POA: Diagnosis not present

## 2017-10-25 DIAGNOSIS — R0600 Dyspnea, unspecified: Secondary | ICD-10-CM

## 2017-10-25 NOTE — Patient Instructions (Addendum)

## 2017-10-25 NOTE — Progress Notes (Signed)
Clinical Summary Frances Hughes is a 62 y.o.female seen as new consult, referred by Dr Buelah Manis for leg edema  1. Leg edema - controlled, on lasix 26m daily, will take additional as needed.    2. OSA  - has repeat sleep study pending.  - difficultly using current cpap  3. Malignant HTN - compliant with meds - home bp's 120s/70   4. DOE - 2015 echo LVEF 537-48% grade I diastolic dysfunction, mild MR - 2015 nuclear stress without clear ischemia  - some SOB at times, mainly with walking incline.  - sedentary lifestyle, tolerates her daily activities without troubles.  - gradual worsening of SOB/DOE. Reports some weight graudal weight gain.    5. Depression - followed by pcp, related to passing of her son.   6. Hypothyroidism - TOLM78.6on last check, followed by pcp  7. Prediabetes - managed by pcp Past Medical History:  Diagnosis Date  . Adrenal mass, left ([place]   . DM (diabetes mellitus) ([place]    Type II controlled  . Enlarged heart   . Grave's disease   . HTN (hypertension)   . Hyperlipidemia   . Lymphocytosis   . Nephrolithiasis   . OSA on CPAP      No Known Allergies   Current Outpatient Medications  Medication Sig Dispense Refill  . amLODipine (NORVASC) 10 MG tablet Take 1 tablet (10 mg total) by mouth daily. 90 tablet 3  . aspirin 81 MG tablet Take 1 tablet (81 mg total) by mouth daily. 30 tablet 1  . Blood Glucose Monitoring Suppl (BLOOD GLUCOSE MONITOR SYSTEM) W/DEVICE KIT Please dispense based on patient and insurance preference. Use to monitor fasting blood sugar 1x daily. Dx: E11.9 1 each 0  . carvedilol (COREG) 25 MG tablet Take 1 tablet (25 mg total) by mouth 2 (two) times daily. 180 tablet 3  . escitalopram (LEXAPRO) 20 MG tablet Take 1 tablet (20 mg total) by mouth at bedtime. 90 tablet 2  . furosemide (LASIX) 40 MG tablet TAKE (1) TABLET BY MOUTH ONCE DAILY AS NEEDED. 90 tablet 3  . Glucose Blood (BLOOD GLUCOSE TEST STRIPS) STRP Please  dispense based on patient and insurance preference. Use to monitor fasting blood sugar 1x daily. Dx: E11.9 100 each 0  . Lancets 28G MISC Please dispense based on patient and insurance preference. Use to monitor fasting blood sugar 1x daily. Dx: E11.9 100 each 0  . levothyroxine (SYNTHROID, LEVOTHROID) 200 MCG tablet Take 1 tablet (200 mcg total) by mouth daily before breakfast. 30 tablet 3  . losartan (COZAAR) 100 MG tablet TAKE (1) TABLET BY MOUTH DAILY FOR HIGH BLOOD PRESSURE. 90 tablet 3  . metFORMIN (GLUCOPHAGE) 500 MG tablet Take 1 tablet (500 mg total) by mouth 2 (two) times daily with a meal. 180 tablet 3  . Multiple Vitamins-Minerals (CENTRUM SILVER PO) Take 1 tablet by mouth daily.    . potassium chloride SA (K-DUR,KLOR-CON) 20 MEQ tablet Take 1 tablet (20 mEq total) by mouth daily. 90 tablet 3   No current facility-administered medications for this visit.      Past Surgical History:  Procedure Laterality Date  . ABDOMINAL HYSTERECTOMY    . HERNIA REPAIR  2011  . LITHOTRIPSY    . S/P Hysterectomy  10/1996   with bilat SOO seconardt to fibroids  . thyroid ablation  1986     No Known Allergies    Family History  Problem Relation Age of Onset  . Colon  cancer Mother   . Colon cancer Sister   . Liver cancer Sister   . Cancer Sister        Colon cancer     Social History Frances Hughes reports that she quit smoking about 29 years ago. Her smoking use included cigarettes. She started smoking about 48 years ago. She has a 7.50 pack-year smoking history. She has never used smokeless tobacco. Frances Hughes reports that she does not drink alcohol.   Review of Systems CONSTITUTIONAL: No weight loss, fever, chills, weakness or fatigue.  HEENT: Eyes: No visual loss, blurred vision, double vision or yellow sclerae.No hearing loss, sneezing, congestion, runny nose or sore throat.  SKIN: No rash or itching.  CARDIOVASCULAR: per hpi RESPIRATORY: per hpi GASTROINTESTINAL: No  anorexia, nausea, vomiting or diarrhea. No abdominal pain or blood.  GENITOURINARY: No burning on urination, no polyuria NEUROLOGICAL: No headache, dizziness, syncope, paralysis, ataxia, numbness or tingling in the extremities. No change in bowel or bladder control.  MUSCULOSKELETAL: No muscle, back pain, joint pain or stiffness.  LYMPHATICS: No enlarged nodes. No history of splenectomy.  PSYCHIATRIC: +depression ENDOCRINOLOGIC: No reports of sweating, cold or heat intolerance. No polyuria or polydipsia.  Marland Kitchen   Physical Examination Vitals:   10/25/17 0914 10/25/17 0917  BP: (!) 174/74 (!) 173/101  Pulse: 87 86  SpO2: 95%    Vitals:   10/25/17 0908  Weight: 296 lb 12.8 oz (134.6 kg)  Height: '5\' 3"'  (1.6 m)    Gen: resting comfortably, no acute distress HEENT: no scleral icterus, pupils equal round and reactive, no palptable cervical adenopathy,  CV: RRR, no m/r/g, no jvd Resp: Clear to auscultation bilaterally GI: abdomen is soft, non-tender, non-distended, normal bowel sounds, no hepatosplenomegaly MSK: extremities are warm, no edema.  Skin: warm, no rash Neuro:  no focal deficits Psych: appropriate affect   Diagnostic Studies 10/2013 echo Study Conclusions  - Procedure narrative: Transthoracic echocardiography. Image quality was suboptimal, due to poor sound transmission. - Left ventricle: The cavity size was normal. Wall thickness was increased in a pattern of moderate LVH. Systolic function was normal. The estimated ejection fraction was in the range of 55% to 60%. Images were inadequate for LV wall motion assessment. There was an increased relative contribution of atrial contraction to ventricular filling. Doppler parameters are consistent with abnormal left ventricular relaxation (grade 1 diastolic dysfunction). Doppler parameters are consistent with high ventricular filling pressure. - Aortic valve: Mildly calcified annulus. Trileaflet;  mildly thickened, mildly calcified leaflets. - Mitral valve: Calcified annulus. Mildly thickened, mildly calcified leaflets . There was mild regurgitation. - Left atrium: The atrium was mildly to moderately dilated. - Tricuspid valve: There was trivial regurgitation.  11/2013 nuclear stress IMPRESSION: 1.  Probably normal Lexiscan Cardiolite study.  2. Significant soft tissue attenuation artifact noted. Reasonably normal myocardial perfusion and regional wall motion.  3.  Markedly hypertensive at rest, BP 212/108 (malignant HTN).  4.  Normal LV systolic function, calculated LVEF 59%.   Assessment and Plan  1. LE edema/DOE - recent edema and SOB, we will repeat her echo  2. HTN - home bp's at goal, continue current meds. Elevated in clinic, suspect component of white coat HTN        Arnoldo Lenis, M.D., F.A.C.C.

## 2017-10-26 ENCOUNTER — Encounter: Payer: Self-pay | Admitting: *Deleted

## 2017-10-31 ENCOUNTER — Other Ambulatory Visit: Payer: Self-pay | Admitting: *Deleted

## 2017-10-31 MED ORDER — LEVOTHYROXINE SODIUM 25 MCG PO TABS: 25.0000 ug | ORAL_TABLET | Freq: Every day | ORAL | 3 refills | Status: DC

## 2017-11-01 ENCOUNTER — Other Ambulatory Visit: Payer: Self-pay | Admitting: Family Medicine

## 2017-11-01 ENCOUNTER — Ambulatory Visit (HOSPITAL_COMMUNITY)
Admission: RE | Admit: 2017-11-01 | Discharge: 2017-11-01 | Disposition: A | Discharge status: Home / Self Care | Payer: BLUE CROSS/BLUE SHIELD | Source: Ambulatory Visit | Attending: Family Medicine | Admitting: Family Medicine

## 2017-11-01 DIAGNOSIS — Z1231 Encounter for screening mammogram for malignant neoplasm of breast: Secondary | ICD-10-CM | POA: Diagnosis not present

## 2017-11-01 DIAGNOSIS — Z0000 Encounter for general adult medical examination without abnormal findings: Secondary | ICD-10-CM

## 2017-11-01 DIAGNOSIS — Z1239 Encounter for other screening for malignant neoplasm of breast: Secondary | ICD-10-CM

## 2017-11-17 ENCOUNTER — Telehealth: Payer: Self-pay | Admitting: *Deleted

## 2017-11-17 ENCOUNTER — Other Ambulatory Visit: Payer: Self-pay | Admitting: *Deleted

## 2017-11-17 MED ORDER — METFORMIN HCL 500 MG PO TABS: 500.0000 mg | ORAL_TABLET | Freq: Two times a day (BID) | ORAL | 3 refills | Status: DC

## 2017-11-17 MED ORDER — FUROSEMIDE 40 MG PO TABS: ORAL_TABLET | ORAL | 3 refills | Status: DC

## 2017-11-17 MED ORDER — ESCITALOPRAM OXALATE 20 MG PO TABS
20.0000 mg | ORAL_TABLET | Freq: Every day | ORAL | 2 refills | Status: DC
Start: 2017-11-17 — End: 2018-04-20

## 2017-11-17 MED ORDER — LOSARTAN POTASSIUM 100 MG PO TABS: ORAL_TABLET | ORAL | 3 refills | Status: DC

## 2017-11-17 MED ORDER — CARVEDILOL 25 MG PO TABS: 25.0000 mg | ORAL_TABLET | Freq: Two times a day (BID) | ORAL | 3 refills | Status: DC

## 2017-11-17 MED ORDER — POTASSIUM CHLORIDE CRYS ER 20 MEQ PO TBCR: 20.0000 meq | EXTENDED_RELEASE_TABLET | Freq: Every day | ORAL | 3 refills | Status: DC

## 2017-11-17 MED ORDER — AMLODIPINE BESYLATE 10 MG PO TABS: 10.0000 mg | ORAL_TABLET | Freq: Every day | ORAL | 3 refills | Status: DC

## 2017-11-17 NOTE — Telephone Encounter (Signed)
noted 

## 2017-11-17 NOTE — Telephone Encounter (Signed)
Received call from patient.   Reports that she has noted her current CPAP is broken. States that she has had current model since 2002. Requested order to be sent to [facility]. States that she understands that insurance will not cover since she has not completed recent sleep study. States that she has spoken to [employer] and has agreed to pay out of pocket.   Order faxed for Auto Pap @ 5-56mmHg.

## 2017-12-07 ENCOUNTER — Ambulatory Visit (INDEPENDENT_AMBULATORY_CARE_PROVIDER_SITE_OTHER): Payer: BLUE CROSS/BLUE SHIELD

## 2017-12-07 ENCOUNTER — Other Ambulatory Visit: Payer: Self-pay

## 2017-12-07 DIAGNOSIS — R0609 Other forms of dyspnea: Secondary | ICD-10-CM

## 2017-12-07 DIAGNOSIS — R0600 Dyspnea, unspecified: Secondary | ICD-10-CM

## 2017-12-13 ENCOUNTER — Ambulatory Visit: Payer: BLUE CROSS/BLUE SHIELD | Admitting: Family Medicine

## 2017-12-14 ENCOUNTER — Telehealth: Payer: Self-pay | Admitting: *Deleted

## 2017-12-14 NOTE — Telephone Encounter (Signed)
Pt aware - routed to pcp  

## 2017-12-14 NOTE — Telephone Encounter (Signed)
-----   Message from Arnoldo Lenis, MD sent at 12/14/2017  2:27 PM EDT ----- Echo shows heart pumping function is normal. Her heart is a little bit stiffer than normal, this is common with age and can cause some edema, but overall is a mild findings  Zandra Abts MD

## 2017-12-15 ENCOUNTER — Ambulatory Visit: Payer: BLUE CROSS/BLUE SHIELD | Admitting: Family Medicine

## 2017-12-15 ENCOUNTER — Encounter: Payer: Self-pay | Admitting: Family Medicine

## 2017-12-15 ENCOUNTER — Other Ambulatory Visit: Payer: Self-pay

## 2017-12-15 ENCOUNTER — Encounter: Payer: Self-pay | Admitting: *Deleted

## 2017-12-15 VITALS — BP 142/82 | HR 76 | Temp 98.5°F | Resp 14 | Ht 63.0 in | Wt 296.0 lb

## 2017-12-15 DIAGNOSIS — E038 Other specified hypothyroidism: Secondary | ICD-10-CM | POA: Diagnosis not present

## 2017-12-15 DIAGNOSIS — R221 Localized swelling, mass and lump, neck: Secondary | ICD-10-CM

## 2017-12-15 DIAGNOSIS — R0602 Shortness of breath: Secondary | ICD-10-CM

## 2017-12-15 DIAGNOSIS — F321 Major depressive disorder, single episode, moderate: Secondary | ICD-10-CM

## 2017-12-15 DIAGNOSIS — Z1211 Encounter for screening for malignant neoplasm of colon: Secondary | ICD-10-CM

## 2017-12-15 DIAGNOSIS — R490 Dysphonia: Secondary | ICD-10-CM | POA: Diagnosis not present

## 2017-12-15 DIAGNOSIS — E1149 Type 2 diabetes mellitus with other diabetic neurological complication: Secondary | ICD-10-CM | POA: Diagnosis not present

## 2017-12-15 DIAGNOSIS — G4733 Obstructive sleep apnea (adult) (pediatric): Secondary | ICD-10-CM

## 2017-12-15 MED ORDER — FUROSEMIDE 20 MG PO TABS: ORAL_TABLET | ORAL | 2 refills | Status: DC

## 2017-12-15 MED ORDER — CARVEDILOL 25 MG PO TABS: 25.0000 mg | ORAL_TABLET | Freq: Two times a day (BID) | ORAL | 3 refills | Status: DC

## 2017-12-15 NOTE — Patient Instructions (Addendum)
Lasix 20mg  daily for fluid and heart For your breathing- Pulmonary Function Test done at [facility] Referral to Dr. Benjamine Mola for you neck/voice changes Sleep study to be done  Refilled coreg  Thyroid to be rechecked if still elevated- endocrinology  Colonoscopy referral  Call for therapy  Take lexapro 1/2 lexapro for 2 weeks, then go to full tablet  F/U 3 months

## 2017-12-15 NOTE — Progress Notes (Signed)
Subjective:    [name], female    DOB: 02-Aug-1955, 62 y.o.   MRN: [id number]  Patient presents for Follow-up (is not fasting)   Pt here for intermin f/u. Last seen in May at her CPE.   She was referred to cardiology for CHF symptoms, peripheral edema, SOB. She had  ECHO showing  Normal heart function, some diastolic dysfunction Still misses some days of lasix  Hypothyroidism- last TSH 13.2 , when last checked in May advised Synthroid 225mg  daily, due for repeat today, her levels have been very difficult to control, some times due to compliance , but recently she has been taking her meds   Hyperlipidemia- she declined statin drug , states she was going to work on diet, weight still going up now at 296lbs  Frances Hughes   OSA- discussed need for sleep study, instead of relying on old machine  SOB- continues to have SOB, despite work and meds from cardiac standpoint, remote history of smoking, has COPD in family and has been concerned about this  Would like to be seen by ENT again- has been told many times her airways are thickened/swollen, more noticeable when she lies back she feels the area. Voice also continues to get hoarse  HTN- out of coreg  Overdue for colonoscopy  Daughter Frances Hughes here today states she is going to assist mother with following through on everything     Review Of Systems:  GEN- denies fatigue, fever, weight loss,weakness, recent illness HEENT- denies eye drainage, change in vision, nasal discharge, CVS- denies chest pain, palpitations RESP- + SOB, cough, wheeze ABD- denies N/V, change in stools, abd pain GU- denies dysuria, hematuria, dribbling, incontinence MSK- + joint pain, muscle aches, injury Neuro- denies headache, dizziness, syncope, seizure activity       Objective:    BP (!) 142/82   Pulse 76   Temp 98.5 F (36.9 C) (Oral)   Resp 14   Ht 5\' 3"  (1.6 m)   Wt 296 lb (134.3 kg)   SpO2 93%   BMI 52.43  kg/m  GEN- NAD, alert and oriented x3,obese HEENT- PERRL, EOMI, non injected sclera, pink conjunctiva, MMM, oropharynx clear Neck- Supple, no thyromegaly CVS- RRR, no murmur RESP-CTAB ABD-NABS,soft,NT,ND Psych- normal affect and mood EXT- 1+ edema L >R  Pulses- Radial, DP- 2+        Assessment & Plan:      Problem List Items Addressed This Visit      Unprioritized   Depression (emotion)    See previous notes, she is not taking lexapro regulary Her depression from loss of son keeping her from properly caring for herself.  Restart lexapro daily at 10mg  Recommend calling therapist again       Diabetes mellitus, type II ([place])    A1C up some at 6.9%, increase metformin to 1.5 tablets BID with her morbid obesity       Relevant Orders   CBC with Differential/Platelet (Completed)   Comprehensive metabolic panel (Completed)   Hemoglobin A1c (Completed)   Hoarse voice quality    With throat swelling/thickening based on previous views with anesthesia and voice changes sent to ENT for evaluation      Relevant Orders   Ambulatory referral to ENT   Hypothyroidism - Primary    recheck TFT      Relevant Medications   carvedilol (COREG) 25 MG tablet   Other Relevant Orders   T3, free (Completed)   TSH (Completed)  T4, free (Completed)   Localized swelling, mass and lump, neck   Relevant Orders   Ambulatory referral to ENT   OSA (obstructive sleep apnea)    Agrees to reschedule sleep study, will send for split night to get updated settings      Relevant Orders   Split night study   SOB (shortness of breath)    Obtain PFT      Relevant Orders   Pulmonary function test    Other Visit Diagnoses    Colon cancer screening       Relevant Orders   Ambulatory referral to Gastroenterology      Note: This dictation was prepared with Dragon dictation along with smaller phrase technology. Any transcriptional errors that result from this process are unintentional.

## 2017-12-16 LAB — COMPREHENSIVE METABOLIC PANEL
AG RATIO: 1.1 (calc) (ref 1.0–2.5)
ALT: 30 U/L — ABNORMAL HIGH (ref 6–29)
AST: 32 U/L (ref 10–35)
Albumin: 3.8 g/dL (ref 3.6–5.1)
Alkaline phosphatase (APISO): 94 U/L (ref 33–130)
BUN: 14 mg/dL (ref 7–25)
CO2: 37 mmol/L — AB (ref 20–32)
CREATININE: 0.69 mg/dL (ref 0.50–0.99)
Calcium: 9.2 mg/dL (ref 8.6–10.4)
Chloride: 99 mmol/L (ref 98–110)
GLOBULIN: 3.4 g/dL (ref 1.9–3.7)
GLUCOSE: 113 mg/dL — AB (ref 65–99)
Potassium: 4 mmol/L (ref 3.5–5.3)
SODIUM: 141 mmol/L (ref 135–146)
TOTAL PROTEIN: 7.2 g/dL (ref 6.1–8.1)
Total Bilirubin: 0.4 mg/dL (ref 0.2–1.2)

## 2017-12-16 LAB — CBC WITH DIFFERENTIAL/PLATELET
BASOS PCT: 0.4 %
Basophils Absolute: 31 cells/uL (ref 0–200)
EOS PCT: 2.3 %
Eosinophils Absolute: 179 cells/uL (ref 15–500)
HCT: 39.5 % (ref 35.0–45.0)
HEMOGLOBIN: 12.5 g/dL (ref 11.7–15.5)
Lymphs Abs: 2028 cells/uL (ref 850–3900)
MCH: 25.2 pg — ABNORMAL LOW (ref 27.0–33.0)
MCHC: 31.6 g/dL — ABNORMAL LOW (ref 32.0–36.0)
MCV: 79.5 fL — ABNORMAL LOW (ref 80.0–100.0)
MONOS PCT: 6.4 %
MPV: 10.6 fL (ref 7.5–12.5)
NEUTROS ABS: 5062 {cells}/uL (ref 1500–7800)
Neutrophils Relative %: 64.9 %
Platelets: 260 10*3/uL (ref 140–400)
RBC: 4.97 10*6/uL (ref 3.80–5.10)
RDW: 13.7 % (ref 11.0–15.0)
Total Lymphocyte: 26 %
WBC mixed population: 499 cells/uL (ref 200–950)
WBC: 7.8 10*3/uL (ref 3.8–10.8)

## 2017-12-16 LAB — T3, FREE: T3, Free: 2.6 pg/mL (ref 2.3–4.2)

## 2017-12-16 LAB — HEMOGLOBIN A1C
EAG (MMOL/L): 8.4 (calc)
Hgb A1c MFr Bld: 6.9 % of total Hgb — ABNORMAL HIGH (ref <5.7)
MEAN PLASMA GLUCOSE: 151 (calc)

## 2017-12-16 LAB — T4, FREE: FREE T4: 1.2 ng/dL (ref 0.8–1.8)

## 2017-12-16 LAB — TSH: TSH: 1.86 mIU/L (ref 0.40–4.50)

## 2017-12-18 ENCOUNTER — Encounter: Payer: Self-pay | Admitting: Family Medicine

## 2017-12-18 ENCOUNTER — Other Ambulatory Visit: Payer: Self-pay | Admitting: *Deleted

## 2017-12-18 MED ORDER — METFORMIN HCL 500 MG PO TABS: 750.0000 mg | ORAL_TABLET | Freq: Two times a day (BID) | ORAL | 3 refills | Status: DC

## 2017-12-18 NOTE — Assessment & Plan Note (Signed)
With throat swelling/thickening based on previous views with anesthesia and voice changes sent to ENT for evaluation

## 2017-12-18 NOTE — Assessment & Plan Note (Signed)
recheck TFT

## 2017-12-18 NOTE — Assessment & Plan Note (Signed)
Agrees to reschedule sleep study, will send for split night to get updated settings

## 2017-12-18 NOTE — Assessment & Plan Note (Signed)
See previous notes, she is not taking lexapro regulary Her depression from loss of son keeping her from properly caring for herself.  Restart lexapro daily at 10mg  Recommend calling therapist again

## 2017-12-18 NOTE — Assessment & Plan Note (Signed)
Obtain PFT

## 2017-12-18 NOTE — Assessment & Plan Note (Signed)
A1C up some at 6.9%, increase metformin to 1.5 tablets BID with her morbid obesity

## 2017-12-19 ENCOUNTER — Encounter: Payer: Self-pay | Admitting: Internal Medicine

## 2017-12-20 ENCOUNTER — Encounter: Payer: Self-pay | Admitting: Family Medicine

## 2017-12-20 ENCOUNTER — Telehealth: Payer: Self-pay | Admitting: *Deleted

## 2017-12-20 NOTE — Telephone Encounter (Signed)
Received call from Ten[place], Sleep Tech with [facility].   Reports that [employer] has denied in lab test, but will pay for home sleep study.   Requested new orders to be placed.   MD please advise.

## 2017-12-20 NOTE — Telephone Encounter (Signed)
Okay to change to home test -

## 2017-12-26 ENCOUNTER — Ambulatory Visit (HOSPITAL_COMMUNITY): Admission: RE | Admit: 2017-12-26 | Payer: BLUE CROSS/BLUE SHIELD | Source: Ambulatory Visit

## 2017-12-28 ENCOUNTER — Ambulatory Visit (HOSPITAL_COMMUNITY)
Admission: RE | Admit: 2017-12-28 | Discharge: 2017-12-28 | Disposition: A | Discharge status: Home / Self Care | Payer: BLUE CROSS/BLUE SHIELD | Source: Ambulatory Visit | Attending: Family Medicine | Admitting: Family Medicine

## 2017-12-28 DIAGNOSIS — R0602 Shortness of breath: Secondary | ICD-10-CM | POA: Diagnosis not present

## 2017-12-28 LAB — PULMONARY FUNCTION TEST
DL/VA % pred: 100 %
DL/VA: 4.69 ml/min/mmHg/L
DLCO UNC: 12.81 ml/min/mmHg
DLCO cor % pred: 57 %
DLCO cor: 13.19 ml/min/mmHg
DLCO unc % pred: 55 %
FEF 25-75 Post: 1.16 L/sec
FEF 25-75 Pre: 0.88 L/sec
FEF2575-%Change-Post: 31 %
FEF2575-%Pred-Post: 54 %
FEF2575-%Pred-Pre: 41 %
FEV1-%CHANGE-POST: 2 %
FEV1-%PRED-POST: 49 %
FEV1-%Pred-Pre: 48 %
FEV1-POST: 1.17 L
FEV1-Pre: 1.14 L
FEV1FVC-%Change-Post: 19 %
FEV1FVC-%Pred-Pre: 99 %
FEV6-%Change-Post: -2 %
FEV6-%PRED-POST: 43 %
FEV6-%Pred-Pre: 44 %
FEV6-POST: 1.29 L
FEV6-PRE: 1.32 L
FEV6FVC-%CHANGE-POST: 13 %
FEV6FVC-%PRED-POST: 104 %
FEV6FVC-%PRED-PRE: 91 %
FVC-%CHANGE-POST: -13 %
FVC-%Pred-Post: 41 %
FVC-%Pred-Pre: 48 %
FVC-Post: 1.29 L
FVC-Pre: 1.49 L
PRE FEV6/FVC RATIO: 88 %
Post FEV1/FVC ratio: 91 %
Post FEV6/FVC ratio: 100 %
Pre FEV1/FVC ratio: 76 %
RV % pred: 85 %
RV: 1.73 L
TLC % PRED: 66 %
TLC: 3.24 L

## 2017-12-28 MED ORDER — ALBUTEROL SULFATE (2.5 MG/3ML) 0.083% IN NEBU
2.5000 mg | INHALATION_SOLUTION | Freq: Once | RESPIRATORY_TRACT | Status: AC
  Administered 2017-12-28: 2.5 mg via RESPIRATORY_TRACT

## 2018-01-01 ENCOUNTER — Other Ambulatory Visit: Payer: Self-pay | Admitting: Family Medicine

## 2018-01-01 DIAGNOSIS — G4733 Obstructive sleep apnea (adult) (pediatric): Secondary | ICD-10-CM

## 2018-01-01 NOTE — Telephone Encounter (Signed)
Test changed to Home Sleep study.

## 2018-01-02 ENCOUNTER — Other Ambulatory Visit: Payer: Self-pay | Admitting: *Deleted

## 2018-01-02 ENCOUNTER — Telehealth: Payer: Self-pay | Admitting: Family Medicine

## 2018-01-02 ENCOUNTER — Encounter (HOSPITAL_COMMUNITY): Payer: BLUE CROSS/BLUE SHIELD

## 2018-01-02 DIAGNOSIS — J984 Other disorders of lung: Secondary | ICD-10-CM

## 2018-01-02 DIAGNOSIS — J449 Chronic obstructive pulmonary disease, unspecified: Secondary | ICD-10-CM

## 2018-01-02 MED ORDER — TIOTROPIUM BROMIDE MONOHYDRATE 2.5 MCG/ACT IN AERS: 2.0000 | INHALATION_SPRAY | Freq: Every day | RESPIRATORY_TRACT | 3 refills | Status: DC

## 2018-01-02 MED ORDER — BUDESONIDE-FORMOTEROL FUMARATE 80-4.5 MCG/ACT IN AERO: 2.0000 | INHALATION_SPRAY | Freq: Two times a day (BID) | RESPIRATORY_TRACT | 3 refills | Status: DC

## 2018-01-02 NOTE — Telephone Encounter (Signed)
Called and got patient scheduled for her home sleep study on 01/12/18. Patient is to go to [facility] at 7:30 PM to the sleep lab and pick up sleep study supplies.

## 2018-01-10 NOTE — Telephone Encounter (Signed)
Spoke with patient and informed her of appointment. Patient informed me that she changed the appointment to September 5th. See appointment on 01/25/18 on the appointment desk.

## 2018-01-19 ENCOUNTER — Ambulatory Visit (INDEPENDENT_AMBULATORY_CARE_PROVIDER_SITE_OTHER)
Admission: RE | Admit: 2018-01-19 | Discharge: 2018-01-19 | Disposition: A | Discharge status: Home / Self Care | Payer: BLUE CROSS/BLUE SHIELD | Source: Ambulatory Visit | Attending: Pulmonary Disease | Admitting: Pulmonary Disease

## 2018-01-19 ENCOUNTER — Encounter: Payer: Self-pay | Admitting: Pulmonary Disease

## 2018-01-19 ENCOUNTER — Ambulatory Visit: Payer: BLUE CROSS/BLUE SHIELD | Admitting: Pulmonary Disease

## 2018-01-19 VITALS — BP 124/82 | HR 77 | Ht 63.0 in | Wt 296.0 lb

## 2018-01-19 DIAGNOSIS — R062 Wheezing: Secondary | ICD-10-CM

## 2018-01-19 DIAGNOSIS — R0602 Shortness of breath: Secondary | ICD-10-CM

## 2018-01-19 LAB — NITRIC OXIDE: Nitric Oxide: 9

## 2018-01-19 MED ORDER — ALBUTEROL SULFATE HFA 108 (90 BASE) MCG/ACT IN AERS: 2.0000 | INHALATION_SPRAY | Freq: Four times a day (QID) | RESPIRATORY_TRACT | 2 refills | Status: DC | PRN

## 2018-01-19 NOTE — Progress Notes (Signed)
Frances Hughes    [id number]    08-12-1955  Primary Care Physician:Hazel Crest, Modena Nunnery, MD  Referring Physician: Alycia Rossetti, MD [address] [place], [place] [address]  Chief complaint:  Patient being seen for persistent shortness of breath  HPI:  Patient with a history of shortness of breath Increasing weight gain Has gained over 40 to 50 pounds recently Shortness of breath with activity History of obstructive sleep apnea for which he uses CPAP regularly, she has a repeat study scheduled to ascertain that she is right pressures  Progressive worsening of her shortness of breath especially with weight gain Has leg swelling  There was a concern for asthma and was placed on Symbicort Has not been using it much  She did smoke in the past, quit over 20 years ago No pertinent occupational history Denies any chest pains or chest discomfort   Outpatient Encounter Medications as of 01/19/2018  Medication Sig  . amLODipine (NORVASC) 10 MG tablet Take 1 tablet (10 mg total) by mouth daily.  Marland Kitchen aspirin 81 MG tablet Take 1 tablet (81 mg total) by mouth daily.  . Blood Glucose Monitoring Suppl (BLOOD GLUCOSE MONITOR SYSTEM) W/DEVICE KIT Please dispense based on patient and insurance preference. Use to monitor fasting blood sugar 1x daily. Dx: E11.9  . budesonide-formoterol (SYMBICORT) 80-4.5 MCG/ACT inhaler Inhale 2 puffs into the lungs 2 (two) times daily.  . carvedilol (COREG) 25 MG tablet Take 1 tablet (25 mg total) by mouth 2 (two) times daily.  Marland Kitchen escitalopram (LEXAPRO) 20 MG tablet Take 1 tablet (20 mg total) by mouth at bedtime.  . furosemide (LASIX) 20 MG tablet TAKE (1) TABLET BY MOUTH ONCE DAILY AS NEEDED.  Marland Kitchen Glucose Blood (BLOOD GLUCOSE TEST STRIPS) STRP Please dispense based on patient and insurance preference. Use to monitor fasting blood sugar 1x daily. Dx: E11.9  . Lancets 28G MISC Please dispense based on patient and insurance preference. Use to monitor  fasting blood sugar 1x daily. Dx: E11.9  . levothyroxine (SYNTHROID, LEVOTHROID) 200 MCG tablet Take 1 tablet (200 mcg total) by mouth daily before breakfast.  . levothyroxine (SYNTHROID, LEVOTHROID) 25 MCG tablet Take 1 tablet (25 mcg total) by mouth daily before breakfast. Take with Levothyroxine 219mg to equal 223m every day.  . losartan (COZAAR) 100 MG tablet TAKE (1) TABLET BY MOUTH DAILY FOR HIGH BLOOD PRESSURE.  . metFORMIN (GLUCOPHAGE) 500 MG tablet Take 1.5 tablets (750 mg total) by mouth 2 (two) times daily with a meal.  . Multiple Vitamins-Minerals (CENTRUM SILVER PO) Take 1 tablet by mouth daily.  . potassium chloride SA (K-DUR,KLOR-CON) 20 MEQ tablet Take 1 tablet (20 mEq total) by mouth daily.   No facility-administered encounter medications on file as of 01/19/2018.     Allergies as of 01/19/2018  . (No Known Allergies)    Past Medical History:  Diagnosis Date  . Adrenal mass, left ([facility]  . DM [place]diabetes mellitus) ([place]   Type II controlled  . Enlarged heart   . Grave's disease   . HTN (hypertension)   . Hyperlipidemia   . Lymphocytosis   . Nephrolithiasis   . OSA on CPAP     Past Surgical History:  Procedure Laterality Date  . ABDOMINAL HYSTERECTOMY    . HERNIA REPAIR  2011  . LITHOTRIPSY    . S/P Hysterectomy  10/1996   with bilat SOO seconardt to fibroids  . thyroid ablation  1986    Family History  Problem Relation Age of Onset  . Colon cancer Mother   . Colon cancer Sister   . Liver cancer Sister   . Cancer Sister        Colon cancer    Social History   Socioeconomic History  . Marital status: Legally Separated    Spouse name: Not on file  . Number of children: Not on file  . Years of education: Not on file  . Highest education level: Not on file  Occupational History  . Not on file  Social Needs  . Financial resource strain: Not on file  . Food insecurity:    Worry: Not on file    Inability: Not on file  . Transportation needs:     Medical: Not on file    Non-medical: Not on file  Tobacco Use  . Smoking status: Former Smoker    Packs/day: 0.75    Years: 10.00    Pack years: 7.50    Types: Cigarettes    Start date: 01/30/1969    Last attempt to quit: 05/23/1988    Years since quitting: 29.6  . Smokeless tobacco: Never Used  Substance and Sexual Activity  . Alcohol use: No    Alcohol/week: 0.0 standard drinks  . Drug use: No  . Sexual activity: Yes  Lifestyle  . Physical activity:    Days per week: Not on file    Minutes per session: Not on file  . Stress: Not on file  Relationships  . Social connections:    Talks on phone: Not on file    Gets together: Not on file    Attends religious service: Not on file    Active member of club or organization: Not on file    Attends meetings of clubs or organizations: Not on file    Relationship status: Not on file  . Intimate partner violence:    Fear of current or ex partner: Not on file    Emotionally abused: Not on file    Physically abused: Not on file    Forced sexual activity: Not on file  Other Topics Concern  . Not on file  Social History Narrative  . Not on file    Review of Systems  Constitutional: Positive for appetite change and unexpected weight change.  HENT: Negative.   Respiratory: Positive for shortness of breath and wheezing.   Cardiovascular: Negative.   Gastrointestinal: Negative.   Endocrine: Negative.   Genitourinary: Negative.   Musculoskeletal: Negative.   Skin: Negative.   Allergic/Immunologic: Negative.   Neurological: Negative.   Psychiatric/Behavioral:       Depression    Vitals:   01/19/18 1015  BP: 124/82  Pulse: 77  SpO2: 95%     Physical Exam  Constitutional: She is oriented to person, place, and time.  Morbid obesity  HENT:  Head: Normocephalic and atraumatic.  Eyes: Pupils are equal, round, and reactive to light. Conjunctivae are normal. Right eye exhibits no discharge.  Neck: Normal range of motion. Neck  supple. No tracheal deviation present. No thyromegaly present.  Cardiovascular: Normal rate, regular rhythm and normal heart sounds.  Pulmonary/Chest: Effort normal and breath sounds normal. No respiratory distress. She has no wheezes. She has no rales.  Abdominal: Soft. Bowel sounds are normal. She exhibits no distension.  Musculoskeletal: Normal range of motion. She exhibits edema.  Neurological: She is alert and oriented to person, place, and time. She has normal reflexes. No cranial nerve  deficit. Coordination normal.  Skin: Skin is warm and dry. She is not diaphoretic. No erythema.  Psychiatric: She has a normal mood and affect.     Data Reviewed:  Recent echo reviewed 12/07/2017 Ejection fraction of 60 to 04% Grade 1 diastolic dysfunction  FeNO of 9-in the office today   Assessment:   Shortness of breath Abnormal PFT- showing restrictive lung disease  Occasional wheezing Obstructive sleep apnea  Plan/Recommendations:  .  Continue Symbicort 80  .  Prescription for albuterol  .  We will follow-up on the scheduled sleep study  .  Obtain chest x-ray  .  Inhaler technique reviewed--I did coach the patient on how to use her inhaler properly  The significance of weight loss on her shortness of breath was extensively discussed    I will see her back in the office in about 2 months  Sherrilyn Rist MD [facility] 01/19/2018, 10:30 AM  CC: Alycia Rossetti, MD

## 2018-01-19 NOTE — Patient Instructions (Signed)
Abnormal PFT--mostly restrictive disease  Past history of smoking  Follow-up with scheduled sleep study  Obtain chest x-ray  Echocardiogram to assess right-sided cardiac functions-low diffusing capacity on PFT  Albuterol as needed  Extensive discussion about weight loss and how it impacts her shortness of breath  I will see you back here in about 2 months

## 2018-01-24 ENCOUNTER — Telehealth: Payer: Self-pay | Admitting: *Deleted

## 2018-01-24 NOTE — Telephone Encounter (Signed)
Received call from patient. [phone], ext [phone]  States that she has questions in regards to sleep study.   Please call her to discuss.

## 2018-01-25 ENCOUNTER — Ambulatory Visit: Payer: BLUE CROSS/BLUE SHIELD | Attending: Family Medicine | Admitting: Neurology

## 2018-01-25 DIAGNOSIS — Z7982 Long term (current) use of aspirin: Secondary | ICD-10-CM | POA: Diagnosis not present

## 2018-01-25 DIAGNOSIS — Z79890 Hormone replacement therapy (postmenopausal): Secondary | ICD-10-CM | POA: Insufficient documentation

## 2018-01-25 DIAGNOSIS — Z7984 Long term (current) use of oral hypoglycemic drugs: Secondary | ICD-10-CM | POA: Diagnosis not present

## 2018-01-25 DIAGNOSIS — Z79899 Other long term (current) drug therapy: Secondary | ICD-10-CM | POA: Diagnosis not present

## 2018-01-25 DIAGNOSIS — G4733 Obstructive sleep apnea (adult) (pediatric): Secondary | ICD-10-CM | POA: Diagnosis not present

## 2018-01-25 DIAGNOSIS — Z7951 Long term (current) use of inhaled steroids: Secondary | ICD-10-CM | POA: Insufficient documentation

## 2018-01-25 NOTE — Telephone Encounter (Signed)
Spoke with patient regarding sleep study. Patient wanted to inquire about the process informed her to report to [facility] this evening at 6:30. Patient verbalized understanding.

## 2018-02-01 NOTE — Procedures (Signed)
Paullina A. Merlene Laughter, MD     [email or web address]             HOME SLEEP STUDY  LOCATION: [facility]  Patient Name: Frances Hughes, Frances Hughes Date: 01/25/2018 Gender: Female D.O.B: 06-10-1955 Age (years): 43 Referring Provider: Alycia Rossetti Height (inches): 43 Interpreting Physician: Phillips Odor MD, ABSM Weight (lbs): 196 RPSGT: Peak, Robert BMI: 35 MRN: [id number] Neck Size: 16.50 <br> <br> CLINICAL INFORMATION Sleep Study Type: HST    Indication for sleep study: OSA    Epworth Sleepiness Score: NA  SLEEP STUDY TECHNIQUE A multi-channel overnight portable sleep study was performed. The channels recorded were: nasal airflow, thoracic respiratory movement, and oxygen saturation with a pulse oximetry. Snoring was also monitored.  MEDICATIONS Patient self administered medications include: N/A.  Current Outpatient Medications:  .  albuterol (PROVENTIL HFA;VENTOLIN HFA) 108 (90 Base) MCG/ACT inhaler, Inhale 2 puffs into the lungs every 6 (six) hours as needed for wheezing or shortness of breath., Disp: 1 Inhaler, Rfl: 2 .  amLODipine (NORVASC) 10 MG tablet, Take 1 tablet (10 mg total) by mouth daily., Disp: 90 tablet, Rfl: 3 .  aspirin 81 MG tablet, Take 1 tablet (81 mg total) by mouth daily., Disp: 30 tablet, Rfl: 1 .  Blood Glucose Monitoring Suppl (BLOOD GLUCOSE MONITOR SYSTEM) W/DEVICE KIT, Please dispense based on patient and insurance preference. Use to monitor fasting blood sugar 1x daily. Dx: E11.9, Disp: 1 each, Rfl: 0 .  budesonide-formoterol (SYMBICORT) 80-4.5 MCG/ACT inhaler, Inhale 2 puffs into the lungs 2 (two) times daily., Disp: 1 Inhaler, Rfl: 3 .  carvedilol (COREG) 25 MG tablet, Take 1 tablet (25 mg total) by mouth 2 (two) times daily., Disp: 180 tablet, Rfl: 3 .  escitalopram (LEXAPRO) 20 MG tablet, Take 1 tablet (20 mg total) by mouth at bedtime., Disp: 90 tablet, Rfl: 2 .  furosemide (LASIX) 20 MG tablet, TAKE (1) TABLET  BY MOUTH ONCE DAILY AS NEEDED., Disp: 90 tablet, Rfl: 2 .  Glucose Blood (BLOOD GLUCOSE TEST STRIPS) STRP, Please dispense based on patient and insurance preference. Use to monitor fasting blood sugar 1x daily. Dx: E11.9, Disp: 100 each, Rfl: 0 .  Lancets 28G MISC, Please dispense based on patient and insurance preference. Use to monitor fasting blood sugar 1x daily. Dx: E11.9, Disp: 100 each, Rfl: 0 .  levothyroxine (SYNTHROID, LEVOTHROID) 200 MCG tablet, Take 1 tablet (200 mcg total) by mouth daily before breakfast., Disp: 30 tablet, Rfl: 3 .  levothyroxine (SYNTHROID, LEVOTHROID) 25 MCG tablet, Take 1 tablet (25 mcg total) by mouth daily before breakfast. Take with Levothyroxine 23mg to equal 2240m every day., Disp: 30 tablet, Rfl: 3 .  losartan (COZAAR) 100 MG tablet, TAKE (1) TABLET BY MOUTH DAILY FOR HIGH BLOOD PRESSURE., Disp: 90 tablet, Rfl: 3 .  metFORMIN (GLUCOPHAGE) 500 MG tablet, Take 1.5 tablets (750 mg total) by mouth 2 (two) times daily with a meal., Disp: 90 tablet, Rfl: 3 .  Multiple Vitamins-Minerals (CENTRUM SILVER PO), Take 1 tablet by mouth daily., Disp: , Rfl:  .  potassium chloride SA (K-DUR,KLOR-CON) 20 MEQ tablet, Take 1 tablet (20 mEq total) by mouth daily., Disp: 90 tablet, Rfl: 3   SLEEP ARCHITECTURE Patient was studied for 408.5 minutes. The sleep efficiency was 85.1 % and the patient was supine for 85.9%. The arousal index was 0.0 per hour.  RESPIRATORY PARAMETERS The overall AHI was 175.4 per hour, with a central apnea index of 0.3 per hour.  The oxygen nadir  was 60% during sleep.    CARDIAC DATA Mean heart rate during sleep was 92.5 bpm.  IMPRESSIONS - Severe obstructive sleep apnea occurred during this study (AHI = 175.4/h). Auto Pap 8 - 14 is recommended.   Delano Metz, MD Diplomate, American Board of Sleep Medicine.  ELECTRONICALLY SIGNED ON:  02/01/2018, 5:39 PM [place] PH: [phone]   FX: [phone]  [phone] [place]

## 2018-02-02 ENCOUNTER — Other Ambulatory Visit: Payer: Self-pay | Admitting: Family Medicine

## 2018-03-14 ENCOUNTER — Other Ambulatory Visit: Payer: Self-pay | Admitting: *Deleted

## 2018-03-14 ENCOUNTER — Encounter: Payer: Self-pay | Admitting: *Deleted

## 2018-03-14 ENCOUNTER — Ambulatory Visit: Payer: BLUE CROSS/BLUE SHIELD | Admitting: Gastroenterology

## 2018-03-14 ENCOUNTER — Telehealth: Payer: Self-pay | Admitting: *Deleted

## 2018-03-14 ENCOUNTER — Encounter: Payer: Self-pay | Admitting: Gastroenterology

## 2018-03-14 DIAGNOSIS — Z1211 Encounter for screening for malignant neoplasm of colon: Secondary | ICD-10-CM

## 2018-03-14 MED ORDER — CLENPIQ 10-3.5-12 MG-GM -GM/160ML PO SOLN: 1.0000 | Freq: Once | ORAL | 0 refills | Status: AC

## 2018-03-14 NOTE — Telephone Encounter (Signed)
Patient called back and is aware.

## 2018-03-14 NOTE — Progress Notes (Signed)
Primary Care Physician:  Alycia Rossetti, MD Primary Gastroenterologist:  Dr. Gala Romney   Chief Complaint  Patient presents with  . Colonoscopy    consult, doing ok    HPI:   Frances Hughes is a 62 y.o. female presenting today at the request of Dr. Buelah Manis due to need for screening colonoscopy.  She has no constipation, diarrhea, rectal bleeding, N/V. No unexplained weight loss or lack of appetite. No dysphagia. Last colonoscopy in 2011 at outside hospital normal. Family history of colon cancer in sister. She has been recommended to have 5 year screenings.   Past Medical History:  Diagnosis Date  . Adrenal mass, left ([place])   . 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 Enlarged heart   . Grave's disease   . HTN (hypertension)   . Hyperlipidemia   . Lymphocytosis   . Nephrolithiasis   . OSA on CPAP     Past Surgical History:  Procedure Laterality Date  . ABDOMINAL HYSTERECTOMY    . COLONOSCOPY  2011   normal colonoscopy  . HERNIA REPAIR  2011  . LITHOTRIPSY    . S/P Hysterectomy  10/1996   with bilat SOO seconardt to fibroids  . thyroid ablation  1986    Current Outpatient Medications  Medication Sig Dispense Refill  . amLODipine (NORVASC) 10 MG tablet Take 1 tablet (10 mg total) by mouth daily. 90 tablet 3  . aspirin 81 MG tablet Take 1 tablet (81 mg total) by mouth daily. 30 tablet 1  . Blood Glucose Monitoring Suppl (BLOOD GLUCOSE MONITOR SYSTEM) W/DEVICE KIT Please dispense based on patient and insurance preference. Use to monitor fasting blood sugar 1x daily. Dx: E11.9 1 each 0  . carvedilol (COREG) 25 MG tablet Take 1 tablet (25 mg total) by mouth 2 (two) times daily. 180 tablet 3  . escitalopram (LEXAPRO) 20 MG tablet Take 1 tablet (20 mg total) by mouth at bedtime. 90 tablet 2  . furosemide (LASIX) 20 MG tablet TAKE (1) TABLET BY MOUTH ONCE DAILY AS NEEDED. (Patient taking differently: Take 10 mg by mouth daily. ) 90 tablet 2  . Glucose Blood  (BLOOD GLUCOSE TEST STRIPS) STRP Please dispense based on patient and insurance preference. Use to monitor fasting blood sugar 1x daily. Dx: E11.9 100 each 0  . Lancets 28G MISC Please dispense based on patient and insurance preference. Use to monitor fasting blood sugar 1x daily. Dx: E11.9 100 each 0  . levothyroxine (SYNTHROID, LEVOTHROID) 200 MCG tablet TAKE 1 TABLET BY MOUTH ONCE DAILY BEFORE BREAKFAST. 30 tablet 0  . levothyroxine (SYNTHROID, LEVOTHROID) 25 MCG tablet Take 1 tablet (25 mcg total) by mouth daily before breakfast. Take with Levothyroxine 272mg to equal 2223m every day. 30 tablet 3  . losartan (COZAAR) 100 MG tablet TAKE (1) TABLET BY MOUTH DAILY FOR HIGH BLOOD PRESSURE. 90 tablet 3  . metFORMIN (GLUCOPHAGE) 500 MG tablet Take 1.5 tablets (750 mg total) by mouth 2 (two) times daily with a meal. (Patient taking differently: Take 500 mg by mouth 2 (two) times daily with a meal. ) 90 tablet 3  . Multiple Vitamins-Minerals (CENTRUM SILVER PO) Take 1 tablet by mouth daily.    . potassium chloride SA (K-DUR,KLOR-CON) 20 MEQ tablet Take 1 tablet (20 mEq total) by mouth daily. 90 tablet 3   No current facility-administered medications for this visit.     Allergies as of 03/14/2018  . (No Known Allergies)  Family History  Problem Relation Age of Onset  . Colon cancer Mother   . Colon cancer Sister   . Liver cancer Sister   . Cancer Sister        Colon cancer    Social History   Socioeconomic History  . Marital status: Legally Separated    Spouse name: Not on file  . Number of children: Not on file  . Years of education: Not on file  . Highest education level: Not on file  Occupational History  . Not on file  Social Needs  . Financial resource strain: Not on file  . Food insecurity:    Worry: Not on file    Inability: Not on file  . Transportation needs:    Medical: Not on file    Non-medical: Not on file  Tobacco Use  . Smoking status: Former Smoker     Packs/day: 0.75    Years: 10.00    Pack years: 7.50    Types: Cigarettes    Start date: 01/30/1969    Last attempt to quit: 05/23/1988    Years since quitting: 29.8  . Smokeless tobacco: Never Used  Substance and Sexual Activity  . Alcohol use: No    Alcohol/week: 0.0 standard drinks  . Drug use: No  . Sexual activity: Yes  Lifestyle  . Physical activity:    Days per week: Not on file    Minutes per session: Not on file  . Stress: Not on file  Relationships  . Social connections:    Talks on phone: Not on file    Gets together: Not on file    Attends religious service: Not on file    Active member of club or organization: Not on file    Attends meetings of clubs or organizations: Not on file    Relationship status: Not on file  . Intimate partner violence:    Fear of current or ex partner: Not on file    Emotionally abused: Not on file    Physically abused: Not on file    Forced sexual activity: Not on file  Other Topics Concern  . Not on file  Social History Narrative  . Not on file    Review of Systems: Gen: Denies any fever, chills, fatigue, weight loss, lack of appetite.  CV: Denies chest pain, heart palpitations, peripheral edema, syncope.  Resp: Denies shortness of breath at rest or with exertion. Denies wheezing or cough.  GI: see HPI  GU : Denies urinary burning, urinary frequency, urinary hesitancy MS: Denies joint pain, muscle weakness, cramps, or limitation of movement.  Derm: Denies rash, itching, dry skin Psych: Denies depression, anxiety, memory loss, and confusion Heme: Denies bruising, bleeding, and enlarged lymph nodes.  Physical Exam: BP (!) 180/100 (only took part of meds this morning) (BP Location: Right Arm)   Pulse 92   Temp 97.8 F (36.6 C) (Oral)   Ht 5' 3" (1.6 m)   Wt 290 lb 12.8 oz (131.9 kg)   BMI 51.51 kg/m  General:   Alert and oriented. Pleasant and cooperative. Well-nourished and well-developed.  Head:  Normocephalic and  atraumatic. Eyes:  Without icterus, sclera clear and conjunctiva pink.  Ears:  Normal auditory acuity. Nose:  No deformity, discharge,  or lesions. Mouth:  No deformity or lesions, oral mucosa pink.  Neck:  Supple, without mass or thyromegaly. Lungs:  Clear to auscultation bilaterally. No wheezes, rales, or rhonchi. No distress.  Heart:  S1, S2 present without  murmurs appreciated.  Abdomen:  +BS, soft, non-tender and non-distended. No HSM noted. No guarding or rebound. No masses appreciated.  Rectal:  Deferred  Msk:  Symmetrical without gross deformities. Normal posture. Extremities:  Without edema. Neurologic:  Alert and  oriented x4 Psych:  Alert and cooperative. Normal mood and affect.

## 2018-03-14 NOTE — Patient Instructions (Signed)
We have scheduled you for a colonoscopy with Dr. Gala Romney in the near future.  Do not take metformin the day of the procedure. Make sure to take your blood pressure medication with small sips of water the morning of the procedure!  It was a pleasure to see you today. I strive to create trusting relationships with patients to provide genuine, compassionate, and quality care. I value your feedback. If you receive a survey regarding your visit,  I greatly appreciate you taking time to fill this out.   Annitta Needs, PhD, ANP-BC [facility]

## 2018-03-14 NOTE — Telephone Encounter (Signed)
Pre-op scheduled for 04/10/18 at 9:00am. Letter mailed. LMOVM

## 2018-03-19 ENCOUNTER — Ambulatory Visit: Payer: BLUE CROSS/BLUE SHIELD | Admitting: Physician Assistant

## 2018-03-22 ENCOUNTER — Encounter: Payer: Self-pay | Admitting: Pulmonary Disease

## 2018-03-22 ENCOUNTER — Ambulatory Visit: Payer: BLUE CROSS/BLUE SHIELD | Admitting: Pulmonary Disease

## 2018-03-22 VITALS — BP 126/86 | HR 89 | Ht 63.0 in | Wt 290.0 lb

## 2018-03-22 DIAGNOSIS — R5383 Other fatigue: Secondary | ICD-10-CM

## 2018-03-22 DIAGNOSIS — R0602 Shortness of breath: Secondary | ICD-10-CM

## 2018-03-22 DIAGNOSIS — Z9989 Dependence on other enabling machines and devices: Secondary | ICD-10-CM | POA: Diagnosis not present

## 2018-03-22 DIAGNOSIS — G4733 Obstructive sleep apnea (adult) (pediatric): Secondary | ICD-10-CM

## 2018-03-22 NOTE — Progress Notes (Signed)
Frances Hughes    [id number]    [birth year]  Primary Care Physician:[place], Modena Nunnery, MD  Referring Physician: Alycia Rossetti, [address], [place] [address]  Chief complaint:  Patient being seen for persistent shortness of breath Shortness of breath is better  HPI: She is feeling much better Becoming more active Has some leg swelling that is been addressed She does occasionally feel lightheaded with activity-she is on diuretics and multiple medications for hypertension  Patient with a history of shortness of breath Increasing weight gain Has managed to lose a few pounds recently with increased activity, activity levels restricted secondary to significant back pain Shortness of breath with activity She has obstructive sleep apnea which is severe-uses CPAP on a regular basis  There was a concern for asthma and was placed on Symbicort Has not been using it much  She did smoke in the past, quit over 20 years ago No pertinent occupational history Denies any chest pains or chest discomfort   Outpatient Encounter Medications as of 03/22/2018  Medication Sig  . amLODipine (NORVASC) 10 MG tablet Take 1 tablet (10 mg total) by mouth daily.  Marland Kitchen aspirin 81 MG tablet Take 1 tablet (81 mg total) by mouth daily.  . Blood Glucose Monitoring Suppl (BLOOD GLUCOSE MONITOR SYSTEM) W/DEVICE KIT Please dispense based on patient and insurance preference. Use to monitor fasting blood sugar 1x daily. Dx: E11.9  . carvedilol (COREG) 25 MG tablet Take 1 tablet (25 mg total) by mouth 2 (two) times daily.  Marland Kitchen escitalopram (LEXAPRO) 20 MG tablet Take 1 tablet (20 mg total) by mouth at bedtime.  . furosemide (LASIX) 20 MG tablet TAKE (1) TABLET BY MOUTH ONCE DAILY AS NEEDED. (Patient taking differently: Take 10 mg by mouth daily. )  . Glucose Blood (BLOOD GLUCOSE TEST STRIPS) STRP Please dispense based on patient and insurance preference. Use to monitor fasting blood sugar  1x daily. Dx: E11.9  . Lancets 28G MISC Please dispense based on patient and insurance preference. Use to monitor fasting blood sugar 1x daily. Dx: E11.9  . levothyroxine (SYNTHROID, LEVOTHROID) 200 MCG tablet TAKE 1 TABLET BY MOUTH ONCE DAILY BEFORE BREAKFAST.  Marland Kitchen levothyroxine (SYNTHROID, LEVOTHROID) 25 MCG tablet Take 1 tablet (25 mcg total) by mouth daily before breakfast. Take with Levothyroxine 264mg to equal 2230m every day.  . losartan (COZAAR) 100 MG tablet TAKE (1) TABLET BY MOUTH DAILY FOR HIGH BLOOD PRESSURE.  . metFORMIN (GLUCOPHAGE) 500 MG tablet Take 1.5 tablets (750 mg total) by mouth 2 (two) times daily with a meal. (Patient taking differently: Take 500 mg by mouth 2 (two) times daily with a meal. )  . Multiple Vitamins-Minerals (CENTRUM SILVER PO) Take 1 tablet by mouth daily.  . potassium chloride SA (K-DUR,KLOR-CON) 20 MEQ tablet Take 1 tablet (20 mEq total) by mouth daily.   No facility-administered encounter medications on file as of 03/22/2018.     Allergies as of 03/22/2018  . (No Known Allergies)    Past Medical History:  Diagnosis Date  . Adrenal mass, left ([place]  . DM (diabetes mellitus) ([place]   Type II controlled  . Enlarged heart   . Grave's disease   . HTN (hypertension)   . Hyperlipidemia   . Lymphocytosis   . Nephrolithiasis   . OSA on CPAP     Past Surgical History:  Procedure Laterality Date  . ABDOMINAL HYSTERECTOMY    .  COLONOSCOPY  2011   normal colonoscopy  . HERNIA REPAIR  2011  . LITHOTRIPSY    . S/P Hysterectomy  10/1996   with bilat SOO seconardt to fibroids  . thyroid ablation  1986    Family History  Problem Relation Age of Onset  . Colon cancer Mother   . Colon cancer Sister   . Liver cancer Sister   . Cancer Sister        Colon cancer    Social History   Socioeconomic History  . Marital status: Legally Separated    Spouse name: Not on file  . Number of children: Not on file  . Years of education: Not on file  .  Highest education level: Not on file  Occupational History  . Not on file  Social Needs  . Financial resource strain: Not on file  . Food insecurity:    Worry: Not on file    Inability: Not on file  . Transportation needs:    Medical: Not on file    Non-medical: Not on file  Tobacco Use  . Smoking status: Former Smoker    Packs/day: 0.75    Years: 10.00    Pack years: 7.50    Types: Cigarettes    Start date: 01/30/1969    Last attempt to quit: 05/23/1988    Years since quitting: 29.8  . Smokeless tobacco: Never Used  Substance and Sexual Activity  . Alcohol use: No    Alcohol/week: 0.0 standard drinks  . Drug use: No  . Sexual activity: Yes  Lifestyle  . Physical activity:    Days per week: Not on file    Minutes per session: Not on file  . Stress: Not on file  Relationships  . Social connections:    Talks on phone: Not on file    Gets together: Not on file    Attends religious service: Not on file    Active member of club or organization: Not on file    Attends meetings of clubs or organizations: Not on file    Relationship status: Not on file  . Intimate partner violence:    Fear of current or ex partner: Not on file    Emotionally abused: Not on file    Physically abused: Not on file    Forced sexual activity: Not on file  Other Topics Concern  . Not on file  Social History Narrative  . Not on file    Review of Systems  Constitutional: Positive for appetite change and unexpected weight change.  HENT: Negative.   Respiratory: Positive for shortness of breath. Negative for wheezing.   Cardiovascular: Negative.   Endocrine: Negative.   Genitourinary: Negative.   Musculoskeletal: Positive for back pain.  Allergic/Immunologic: Negative.   Neurological: Negative.   Psychiatric/Behavioral:       Depression    There were no vitals filed for this visit.   Physical Exam  Constitutional: No distress.  Morbid obesity  HENT:  Head: Normocephalic and  atraumatic.  Eyes: Pupils are equal, round, and reactive to light. Conjunctivae are normal. Right eye exhibits no discharge. Left eye exhibits no discharge.  Neck: Normal range of motion. Neck supple. No tracheal deviation present. No thyromegaly present.  Cardiovascular: Normal rate, regular rhythm and normal heart sounds.  Pulmonary/Chest: Effort normal and breath sounds normal. No respiratory distress. She has no wheezes. She has no rales.  Abdominal: Soft. Bowel sounds are normal. She exhibits no distension.  Skin: She is not  diaphoretic.   Data Reviewed:  Recent echo reviewed 12/07/2017 Ejection fraction of 60 to 37% Grade 1 diastolic dysfunction  Chest x-ray reviewed showing no acute infiltrate, some congestion, cardiomegaly  Compliance data reveals excellent compliance with an AHI of 3.1 compared with very severe obstructive sleep apnea on recent home sleep study   Assessment:   Shortness of breath -Multifactorial shortness of breath -More pain and discomfort usually associated with worsening of her shortness of breath   abnormal PFT- showing restrictive lung disease  -Restrictive physiology on PFT likely related to obese status  Occasional wheezing -This is improved -Not as compliant with inhaler use but she feels better generally  Obstructive sleep apnea -Appears adequately treated with CPAP use-on auto titrating CPAP  Plan/Recommendations:  .  Continue Symbicort 80 and albuterol  .  Encourage proper inhaler use  .  Continue CPAP use on a regular basis .  Importance of regular exercises and efforts at weight loss encouraged  The significance of weight loss on her shortness of breath was extensively discussed    I will see her back in the office in about 3 months  Sherrilyn Rist MD [facility] 03/22/2018, 10:07 AM  CC: Alycia Rossetti, MD

## 2018-03-22 NOTE — Assessment & Plan Note (Signed)
62 year old [place] female with family history of colon cancer in sister, last colonoscopy in 2011 normal. Overdue for 5-year-interval screening. No concerning lower or upper GI signs/symptoms. Due to BMI 51, will utilize Propofol.  Proceed with TCS with Dr. Gala Romney in near future: the risks, benefits, and alternatives have been discussed with the patient in detail. The patient states understanding and desires to proceed. Propofol

## 2018-03-22 NOTE — Patient Instructions (Addendum)
Obstructive sleep apnea-adequately treated  Shortness of breath-improving  Encouraged weight loss efforts  Continue lines of care Increase activity as tolerated  See you back in the office in about 3 months

## 2018-03-23 ENCOUNTER — Ambulatory Visit: Payer: BLUE CROSS/BLUE SHIELD | Admitting: Family Medicine

## 2018-03-23 ENCOUNTER — Other Ambulatory Visit: Payer: Self-pay

## 2018-03-23 ENCOUNTER — Encounter: Payer: Self-pay | Admitting: Family Medicine

## 2018-03-23 VITALS — BP 134/82 | HR 86 | Temp 98.6°F | Resp 16 | Ht 63.0 in | Wt 287.0 lb

## 2018-03-23 DIAGNOSIS — E782 Mixed hyperlipidemia: Secondary | ICD-10-CM

## 2018-03-23 DIAGNOSIS — E038 Other specified hypothyroidism: Secondary | ICD-10-CM | POA: Diagnosis not present

## 2018-03-23 DIAGNOSIS — E1149 Type 2 diabetes mellitus with other diabetic neurological complication: Secondary | ICD-10-CM | POA: Diagnosis not present

## 2018-03-23 DIAGNOSIS — I10 Essential (primary) hypertension: Secondary | ICD-10-CM

## 2018-03-23 NOTE — Progress Notes (Signed)
[name], female    DOB: 1956/05/23, 62 y.o.   MRN: [id number]  PCP: Alycia Rossetti, MD  Chief Complaint  Patient presents with  . Follow-up    Subjective:   Frances Hughes is a 62 y.o. female, presents to clinic with CC of routine follow up for HTN, CHF, DM, hypothyroid due for labs.  She went to pulmonology yesterday.  Discussed dx and tx, she does not regularly do the inhalers, but understands that she was encouraged to work on weight loss because extra weight is restricting her breathing.  Encouraged to continue symbicort 80 and albuterol SABA.  She states she only uses inhalers when she feels SOB or wheezy.  Explained maintenance inhaler function.  She has had no problem with her blood pressures she is not actively monitoring but denies any headaches, near-syncope, chest pain, palpitations.  CHF grade 1 diastolic dysfunction, she states she is taking Lasix 10 mg daily she does not currently have any increased lower extremity edema or worsening orthopnea shortness of breath, she does not do daily weights to monitor but rather close by how swollen her legs are.  She is tolerating all her blood pressure without any known adverse effects, currently taking Norvasc 10 mg, Coreg 25 mg twice daily, Lasix 10 mg with potassium supplement.  DM -on metformin 500 mg twice daily, no known side effects, no high or low blood sugar readings.  Last A1C 6.9%  Hypothyroid, she is currently on 225 mcg dose was adjusted increased from 200-2 25 about 3 months ago is due for recheck  Depression/mood is better, doing well with lexapro w/o any SE  Cholesterol - last labs show mildly elevated LDL, diet controlled  Patient Active Problem List   Diagnosis Date Noted  . Encounter for screening colonoscopy 03/14/2018  . GERD (gastroesophageal reflux disease) 07/17/2015  . Lumbago 12/30/2014  . Routine general medical examination at a health care facility 12/06/2013  . Hoarse voice  quality 12/06/2013  . Diastolic dysfunction 98/42/1031  . SOB (shortness of breath) 11/08/2013  . Systolic murmur 28/03/8866  . Urinary incontinence 09/16/2012  . Leg edema 04/05/2012  . Localized swelling, mass and lump, neck 07/15/2011  . Depression (emotion) 07/15/2011  . Obesity 07/15/2011  . Scar condition and fibrosis of skin 03/13/2011  . Hypothyroidism 04/05/2006  . Diabetes mellitus, type II ([place]) 04/05/2006  . Adrenal mass, left ([place]) 04/05/2006  . Hyperlipidemia 04/05/2006  . OSA (obstructive sleep apnea) 04/05/2006  . Essential hypertension 04/05/2006     Prior to Admission medications   Medication Sig Start Date End Date Taking? Authorizing Provider  amLODipine (NORVASC) 10 MG tablet Take 1 tablet (10 mg total) by mouth daily. 11/17/17  Yes [place], Modena Nunnery, MD  aspirin 81 MG tablet Take 1 tablet (81 mg total) by mouth daily. 11/22/12  Yes [place], Modena Nunnery, MD  Blood Glucose Monitoring Suppl (BLOOD GLUCOSE MONITOR SYSTEM) W/DEVICE KIT Please dispense based on patient and insurance preference. Use to monitor fasting blood sugar 1x daily. Dx: E11.9 10/09/14  Yes [place], Modena Nunnery, MD  carvedilol (COREG) 25 MG tablet Take 1 tablet (25 mg total) by mouth 2 (two) times daily. 12/15/17  Yes [place], Modena Nunnery, MD  escitalopram (LEXAPRO) 20 MG tablet Take 1 tablet (20 mg total) by mouth at bedtime. 11/17/17  Yes , Modena Nunnery, MD  furosemide (LASIX) 20 MG tablet TAKE (1) TABLET BY MOUTH ONCE DAILY AS NEEDED. Patient taking differently: Take 10 mg  by mouth daily.  12/15/17  Yes [place], Modena Nunnery, MD  Glucose Blood (BLOOD GLUCOSE TEST STRIPS) STRP Please dispense based on patient and insurance preference. Use to monitor fasting blood sugar 1x daily. Dx: E11.9 01/29/16  Yes [place], Modena Nunnery, MD  Lancets 28G MISC Please dispense based on patient and insurance preference. Use to monitor fasting blood sugar 1x daily. Dx: E11.9 10/09/14  Yes [place], Modena Nunnery, MD  levothyroxine (SYNTHROID,  LEVOTHROID) 200 MCG tablet TAKE 1 TABLET BY MOUTH ONCE DAILY BEFORE BREAKFAST. 02/02/18  Yes [place], Modena Nunnery, MD  levothyroxine (SYNTHROID, LEVOTHROID) 25 MCG tablet Take 1 tablet (25 mcg total) by mouth daily before breakfast. Take with Levothyroxine 215mg to equal 2294m every day. 10/31/17  Yes [place], KaModena NunneryMD  losartan (COZAAR) 100 MG tablet TAKE (1) TABLET BY MOUTH DAILY FOR HIGH BLOOD PRESSURE. 11/17/17  Yes [place], KaModena NunneryMD  metFORMIN (GLUCOPHAGE) 500 MG tablet Take 1.5 tablets (750 mg total) by mouth 2 (two) times daily with a meal. Patient taking differently: Take 500 mg by mouth 2 (two) times daily with a meal.  12/18/17  Yes [place], KaModena NunneryMD  Multiple Vitamins-Minerals (CENTRUM SILVER PO) Take 1 tablet by mouth daily.   Yes [provider]  potassium chloride SA (K-DUR,KLOR-CON) 20 MEQ tablet Take 1 tablet (20 mEq total) by mouth daily. 11/17/17  Yes [place], KaModena NunneryMD     No Known Allergies   Family History  Problem Relation Age of Onset  . Colon cancer Mother   . Colon cancer Sister   . Liver cancer Sister   . Cancer Sister        Colon cancer        Review of Systems  Constitutional: Negative.   HENT: Negative.   Eyes: Negative.   Respiratory: Negative.   Cardiovascular: Negative.   Gastrointestinal: Negative.   Endocrine: Negative.   Genitourinary: Negative.   Musculoskeletal: Negative.   Skin: Negative.   Allergic/Immunologic: Negative.   Neurological: Negative.   Hematological: Negative.   Psychiatric/Behavioral: Negative.   All other systems reviewed and are negative.      Objective:    Vitals:   03/23/18 0807  BP: 134/82  Pulse: 86  Resp: 16  Temp: 98.6 F (37 C)  TempSrc: Oral  SpO2: 94%  Weight: 287 lb (130.2 kg)  Height: '5\' 3"'  (1.6 m)      Physical Exam  Constitutional: She is oriented to person, place, and time. She appears well-developed and well-nourished.  Non-toxic appearance. No distress.  HENT:    Head: Normocephalic and atraumatic.  Right Ear: External ear normal.  Left Ear: External ear normal.  Nose: Nose normal.  Mouth/Throat: Uvula is midline, oropharynx is clear and moist and mucous membranes are normal. No oropharyngeal exudate.  Eyes: Pupils are equal, round, and reactive to light. Conjunctivae, EOM and lids are normal.  Neck: Normal range of motion and phonation normal. Neck supple. No tracheal deviation present. No thyromegaly present.  Cardiovascular: Normal rate, regular rhythm, normal heart sounds, intact distal pulses and normal pulses. Exam reveals no gallop and no friction rub.  No murmur heard. Pulses:      Radial pulses are 2+ on the right side, and 2+ on the left side.  Mild non-pitting b/l LE edema  Pulmonary/Chest: Effort normal and breath sounds normal. No stridor. No respiratory distress. She has no wheezes. She has no rhonchi. She has no rales. She exhibits no tenderness.  Abdominal: Soft. Normal appearance  and bowel sounds are normal. She exhibits no distension. There is no tenderness. There is no rebound and no guarding.  Musculoskeletal: Normal range of motion. She exhibits no edema or deformity.  Lymphadenopathy:    She has no cervical adenopathy.  Neurological: She is alert and oriented to person, place, and time. She exhibits normal muscle tone. Coordination and gait normal.  Skin: Skin is warm, dry and intact. Capillary refill takes less than 2 seconds. No rash noted. She is not diaphoretic. No pallor.  Psychiatric: She has a normal mood and affect. Her speech is normal and behavior is normal. Judgment and thought content normal.  Nursing note and vitals reviewed.    Depression screen [facility] 2/9 03/23/2018 12/15/2017 10/13/2017 08/21/2017 05/30/2017  Decreased Interest '3 2 3 1 2  ' Down, Depressed, Hopeless '2 2 2 2 3  ' PHQ - 2 Score '5 4 5 3 5  ' Altered sleeping 0 '3 3 3 3  ' Tired, decreased energy 0 '3 3 3 3  ' Change in appetite 0 1 0 2 2  Feeling bad or failure  about yourself  0 0 0 0 0  Trouble concentrating 0 0 '2 1 2  ' Moving slowly or fidgety/restless 0 0 0 0 1  Suicidal thoughts 0 0 0 0 0  PHQ-9 Score '5 11 13 12 16  ' Difficult doing work/chores Somewhat difficult Not difficult at all Very difficult Somewhat difficult Extremely dIfficult        Assessment & Plan:      ICD-10-CM   1. Other specified hypothyroidism E03.8 TSH    T3, Free    T4, Free    TSH  2. Type 2 diabetes mellitus with other neurologic complication, without long-term current use of insulin (HCC) E11.49 CBC with Differential/Platelet    COMPLETE METABOLIC PANEL WITH GFR    Hemoglobin A1c    Lipid panel  3. Essential hypertension T24 COMPLETE METABOLIC PANEL WITH GFR  4. Mixed hyperlipidemia E78.2 Lipid panel       Delsa Grana, PA-C 03/23/18 8:14 AM

## 2018-03-23 NOTE — Progress Notes (Signed)
CC'D TO PCP °

## 2018-03-23 NOTE — Patient Instructions (Signed)
Will check labs and call you with any needed medication changes  Will refer you for medical nutritional eval/treatment  Use your maintenance inhaler even if you don't feel wheezy.  If you do feel wheezy use the albuterol rescue inhaler

## 2018-03-24 LAB — CBC WITH DIFFERENTIAL/PLATELET
BASOS PCT: 0.4 %
Basophils Absolute: 27 cells/uL (ref 0–200)
Eosinophils Absolute: 177 cells/uL (ref 15–500)
Eosinophils Relative: 2.6 %
HCT: 40.4 % (ref 35.0–45.0)
Hemoglobin: 12.4 g/dL (ref 11.7–15.5)
LYMPHS ABS: 1992 {cells}/uL (ref 850–3900)
MCH: 24 pg — ABNORMAL LOW (ref 27.0–33.0)
MCHC: 30.7 g/dL — ABNORMAL LOW (ref 32.0–36.0)
MCV: 78.3 fL — AB (ref 80.0–100.0)
MPV: 10.3 fL (ref 7.5–12.5)
Monocytes Relative: 6.9 %
Neutro Abs: 4134 cells/uL (ref 1500–7800)
Neutrophils Relative %: 60.8 %
Platelets: 274 10*3/uL (ref 140–400)
RBC: 5.16 10*6/uL — AB (ref 3.80–5.10)
RDW: 13.7 % (ref 11.0–15.0)
Total Lymphocyte: 29.3 %
WBC: 6.8 10*3/uL (ref 3.8–10.8)
WBCMIX: 469 {cells}/uL (ref 200–950)

## 2018-03-24 LAB — COMPLETE METABOLIC PANEL WITH GFR
AG RATIO: 1.1 (calc) (ref 1.0–2.5)
ALBUMIN MSPROF: 3.8 g/dL (ref 3.6–5.1)
ALT: 21 U/L (ref 6–29)
AST: 24 U/L (ref 10–35)
Alkaline phosphatase (APISO): 84 U/L (ref 33–130)
BUN: 10 mg/dL (ref 7–25)
CALCIUM: 8.9 mg/dL (ref 8.6–10.4)
CO2: 34 mmol/L — ABNORMAL HIGH (ref 20–32)
Chloride: 98 mmol/L (ref 98–110)
Creat: 0.61 mg/dL (ref 0.50–0.99)
GFR, EST AFRICAN AMERICAN: 111 mL/min/{1.73_m2} (ref >=60)
GFR, EST NON AFRICAN AMERICAN: 96 mL/min/{1.73_m2} (ref >=60)
GLOBULIN: 3.5 g/dL (ref 1.9–3.7)
Glucose, Bld: 111 mg/dL — ABNORMAL HIGH (ref 65–99)
POTASSIUM: 3.4 mmol/L — AB (ref 3.5–5.3)
Sodium: 141 mmol/L (ref 135–146)
Total Bilirubin: 0.4 mg/dL (ref 0.2–1.2)
Total Protein: 7.3 g/dL (ref 6.1–8.1)

## 2018-03-24 LAB — LIPID PANEL
Cholesterol: 168 mg/dL (ref <200)
HDL: 54 mg/dL (ref >50)
LDL Cholesterol (Calc): 99 mg/dL (calc)
NON-HDL CHOLESTEROL (CALC): 114 mg/dL (ref <130)
TRIGLYCERIDES: 65 mg/dL (ref <150)
Total CHOL/HDL Ratio: 3.1 (calc) (ref <5.0)

## 2018-03-24 LAB — TSH: TSH: 0.2 m[IU]/L — AB (ref 0.40–4.50)

## 2018-03-24 LAB — T4, FREE: Free T4: 1.7 ng/dL (ref 0.8–1.8)

## 2018-03-24 LAB — HEMOGLOBIN A1C
HEMOGLOBIN A1C: 6.4 %{Hb} — AB (ref <5.7)
MEAN PLASMA GLUCOSE: 137 (calc)
eAG (mmol/L): 7.6 (calc)

## 2018-03-24 LAB — T3, FREE: T3, Free: 2.7 pg/mL (ref 2.3–4.2)

## 2018-03-26 ENCOUNTER — Encounter: Payer: Self-pay | Admitting: *Deleted

## 2018-03-26 ENCOUNTER — Other Ambulatory Visit: Payer: Self-pay | Admitting: *Deleted

## 2018-03-26 ENCOUNTER — Other Ambulatory Visit: Payer: Self-pay | Admitting: Family Medicine

## 2018-03-26 DIAGNOSIS — E782 Mixed hyperlipidemia: Secondary | ICD-10-CM

## 2018-03-26 NOTE — Telephone Encounter (Signed)
This encounter was created in error - please disregard.

## 2018-03-27 ENCOUNTER — Other Ambulatory Visit: Payer: Self-pay | Admitting: Family Medicine

## 2018-03-27 ENCOUNTER — Encounter: Payer: Self-pay | Admitting: *Deleted

## 2018-04-10 ENCOUNTER — Encounter (HOSPITAL_COMMUNITY)
Admission: RE | Admit: 2018-04-10 | Discharge: 2018-04-10 | Disposition: A | Discharge status: Home / Self Care | Payer: BLUE CROSS/BLUE SHIELD | Source: Ambulatory Visit | Attending: Internal Medicine | Admitting: Internal Medicine

## 2018-04-10 ENCOUNTER — Encounter (HOSPITAL_COMMUNITY): Payer: Self-pay

## 2018-04-16 ENCOUNTER — Encounter (HOSPITAL_COMMUNITY)
Admission: RE | Disposition: A | Discharge status: Home / Self Care | Payer: Self-pay | Source: Ambulatory Visit | Attending: Internal Medicine

## 2018-04-16 ENCOUNTER — Encounter (HOSPITAL_COMMUNITY): Payer: Self-pay | Admitting: *Deleted

## 2018-04-16 ENCOUNTER — Ambulatory Visit (HOSPITAL_COMMUNITY)
Admission: RE | Admit: 2018-04-16 | Discharge: 2018-04-16 | Disposition: A | Discharge status: Home / Self Care | Payer: BLUE CROSS/BLUE SHIELD | Source: Ambulatory Visit | Attending: Internal Medicine | Admitting: Internal Medicine

## 2018-04-16 ENCOUNTER — Ambulatory Visit (HOSPITAL_COMMUNITY): Payer: BLUE CROSS/BLUE SHIELD | Admitting: Anesthesiology

## 2018-04-16 DIAGNOSIS — Z7982 Long term (current) use of aspirin: Secondary | ICD-10-CM | POA: Diagnosis not present

## 2018-04-16 DIAGNOSIS — I10 Essential (primary) hypertension: Secondary | ICD-10-CM | POA: Insufficient documentation

## 2018-04-16 DIAGNOSIS — E0500 Thyrotoxicosis with diffuse goiter without thyrotoxic crisis or storm: Secondary | ICD-10-CM | POA: Diagnosis not present

## 2018-04-16 DIAGNOSIS — D122 Benign neoplasm of ascending colon: Secondary | ICD-10-CM | POA: Diagnosis not present

## 2018-04-16 DIAGNOSIS — E785 Hyperlipidemia, unspecified: Secondary | ICD-10-CM | POA: Diagnosis not present

## 2018-04-16 DIAGNOSIS — Z1211 Encounter for screening for malignant neoplasm of colon: Secondary | ICD-10-CM | POA: Diagnosis not present

## 2018-04-16 DIAGNOSIS — Z7984 Long term (current) use of oral hypoglycemic drugs: Secondary | ICD-10-CM | POA: Diagnosis not present

## 2018-04-16 DIAGNOSIS — Z87891 Personal history of nicotine dependence: Secondary | ICD-10-CM | POA: Insufficient documentation

## 2018-04-16 DIAGNOSIS — G4733 Obstructive sleep apnea (adult) (pediatric): Secondary | ICD-10-CM | POA: Diagnosis not present

## 2018-04-16 DIAGNOSIS — Z79899 Other long term (current) drug therapy: Secondary | ICD-10-CM | POA: Insufficient documentation

## 2018-04-16 DIAGNOSIS — Z800 Family history of malignant neoplasm of digestive organs: Secondary | ICD-10-CM | POA: Diagnosis not present

## 2018-04-16 DIAGNOSIS — E119 Type 2 diabetes mellitus without complications: Secondary | ICD-10-CM | POA: Insufficient documentation

## 2018-04-16 HISTORY — PX: POLYPECTOMY: SHX5525

## 2018-04-16 HISTORY — PX: COLONOSCOPY WITH PROPOFOL: SHX5780

## 2018-04-16 LAB — GLUCOSE, CAPILLARY: Glucose-Capillary: 109 mg/dL — ABNORMAL HIGH (ref 70–99)

## 2018-04-16 SURGERY — COLONOSCOPY WITH PROPOFOL
Anesthesia: Monitor Anesthesia Care

## 2018-04-16 MED ORDER — CHLORHEXIDINE GLUCONATE CLOTH 2 % EX PADS: 6.0000 | MEDICATED_PAD | Freq: Once | CUTANEOUS | Status: DC

## 2018-04-16 MED ORDER — LIDOCAINE HCL (PF) 1 % IJ SOLN
INTRAMUSCULAR | Status: AC
  Filled 2018-04-16: qty 5

## 2018-04-16 MED ORDER — PROPOFOL 10 MG/ML IV BOLUS
INTRAVENOUS | Status: AC
  Filled 2018-04-16: qty 20

## 2018-04-16 MED ORDER — PROPOFOL 500 MG/50ML IV EMUL
INTRAVENOUS | Status: DC | PRN
  Administered 2018-04-16: 65 ug/kg/min via INTRAVENOUS
  Administered 2018-04-16: 150 ug/kg/min via INTRAVENOUS
  Administered 2018-04-16: 09:00:00 via INTRAVENOUS

## 2018-04-16 MED ORDER — LACTATED RINGERS IV SOLN
INTRAVENOUS | Status: DC
  Administered 2018-04-16: 1000 mL via INTRAVENOUS

## 2018-04-16 MED ORDER — PROPOFOL 10 MG/ML IV BOLUS
INTRAVENOUS | Status: DC | PRN
  Administered 2018-04-16: 20 mg via INTRAVENOUS

## 2018-04-16 MED ORDER — STERILE WATER FOR IRRIGATION IR SOLN
Status: DC | PRN
  Administered 2018-04-16: 1.5 mL

## 2018-04-16 MED ORDER — GLYCOPYRROLATE 0.2 MG/ML IJ SOLN
INTRAMUSCULAR | Status: AC
  Filled 2018-04-16: qty 1

## 2018-04-16 NOTE — H&P (Signed)
_0 @   Primary Care Physician:  Alycia Rossetti, MD Primary Gastroenterologist:  Dr. Gala Romney  Pre-Procedure History & Physical: HPI:  Frances Hughes is a 62 y.o. female is here for a screening colonoscopy.  Negative colonoscopy about 10 years ago.  Positive family history of colon cancer in her sister.  Past Medical History:  Diagnosis Date  . Adrenal mass, left ([place])   . DM (diabetes mellitus) ([place])    Type II controlled  . Enlarged heart   . Grave's disease   . HTN (hypertension)   . Hyperlipidemia   . Lymphocytosis   . Nephrolithiasis   . OSA on CPAP     Past Surgical History:  Procedure Laterality Date  . ABDOMINAL HYSTERECTOMY    . COLONOSCOPY  2011   normal colonoscopy  . HERNIA REPAIR  2011  . LITHOTRIPSY    . S/P Hysterectomy  10/1996   with bilat SOO seconardt to fibroids  . thyroid ablation  1986    Prior to Admission medications   Medication Sig Start Date End Date Taking? Authorizing Provider  amLODipine (NORVASC) 10 MG tablet Take 1 tablet (10 mg total) by mouth daily. 11/17/17  Yes [place], Modena Nunnery, MD  aspirin 81 MG tablet Take 1 tablet (81 mg total) by mouth daily. 11/22/12  Yes [place], Modena Nunnery, MD  carvedilol (COREG) 25 MG tablet Take 1 tablet (25 mg total) by mouth 2 (two) times daily. 12/15/17  Yes [place], Modena Nunnery, MD  escitalopram (LEXAPRO) 20 MG tablet Take 1 tablet (20 mg total) by mouth at bedtime. 11/17/17  Yes [place], Modena Nunnery, MD  furosemide (LASIX) 20 MG tablet TAKE (1) TABLET BY MOUTH ONCE DAILY AS NEEDED. Patient taking differently: Take 20 mg by mouth daily.  12/15/17  Yes [place], Modena Nunnery, MD  levothyroxine (SYNTHROID, LEVOTHROID) 200 MCG tablet TAKE 1 TABLET BY MOUTH ONCE DAILY BEFORE BREAKFAST. Patient taking differently: Take 200 mcg by mouth daily before breakfast.  03/27/18  Yes [place], Modena Nunnery, MD  losartan (COZAAR) 100 MG tablet TAKE (1) TABLET BY MOUTH DAILY FOR HIGH BLOOD PRESSURE. Patient taking differently: Take 100 mg by  mouth daily. TAKE (1) TABLET BY MOUTH DAILY FOR HIGH BLOOD PRESSURE. 11/17/17  Yes [place], Modena Nunnery, MD  metFORMIN (GLUCOPHAGE) 500 MG tablet Take 1.5 tablets (750 mg total) by mouth 2 (two) times daily with a meal. Patient taking differently: Take 500 mg by mouth 2 (two) times daily with a meal.  12/18/17  Yes [place], Modena Nunnery, MD  Multiple Vitamins-Minerals (CENTRUM SILVER PO) Take 1 tablet by mouth daily.   Yes [provider]  potassium chloride SA (K-DUR,KLOR-CON) 20 MEQ tablet Take 1 tablet (20 mEq total) by mouth daily. 11/17/17  Yes [place], Modena Nunnery, MD  Blood Glucose Monitoring Suppl (BLOOD GLUCOSE MONITOR SYSTEM) W/DEVICE KIT Please dispense based on patient and insurance preference. Use to monitor fasting blood sugar 1x daily. Dx: E11.9 10/09/14   Alycia Rossetti, MD  Glucose Blood (BLOOD GLUCOSE TEST STRIPS) STRP Please dispense based on patient and insurance preference. Use to monitor fasting blood sugar 1x daily. Dx: E11.9 01/29/16   Alycia Rossetti, MD  Lancets 28G MISC Please dispense based on patient and insurance preference. Use to monitor fasting blood sugar 1x daily. Dx: E11.9 10/09/14   Alycia Rossetti, MD    Allergies as of 03/14/2018  . (No Known Allergies)    Family History  Problem Relation Age of Onset  . Colon cancer Mother   .  Colon cancer Sister   . Liver cancer Sister   . Cancer Sister        Colon cancer    Social History   Socioeconomic History  . Marital status: Legally Separated    Spouse name: Not on file  . Number of children: Not on file  . Years of education: Not on file  . Highest education level: Not on file  Occupational History  . Not on file  Social Needs  . Financial resource strain: Not on file  . Food insecurity:    Worry: Not on file    Inability: Not on file  . Transportation needs:    Medical: Not on file    Non-medical: Not on file  Tobacco Use  . Smoking status: Former Smoker    Packs/day: 0.75    Years: 10.00     Pack years: 7.50    Types: Cigarettes    Start date: 01/30/1969    Last attempt to quit: 05/23/1988    Years since quitting: 29.9  . Smokeless tobacco: Never Used  Substance and Sexual Activity  . Alcohol use: No    Alcohol/week: 0.0 standard drinks  . Drug use: No  . Sexual activity: Yes  Lifestyle  . Physical activity:    Days per week: Not on file    Minutes per session: Not on file  . Stress: Not on file  Relationships  . Social connections:    Talks on phone: Not on file    Gets together: Not on file    Attends religious service: Not on file    Active member of club or organization: Not on file    Attends meetings of clubs or organizations: Not on file    Relationship status: Not on file  . Intimate partner violence:    Fear of current or ex partner: Not on file    Emotionally abused: Not on file    Physically abused: Not on file    Forced sexual activity: Not on file  Other Topics Concern  . Not on file  Social History Narrative  . Not on file    Review of Systems: See HPI, otherwise negative ROS  Physical Exam: BP (!) 194/95   Pulse 80   Temp 98.9 F (37.2 C) (Oral)   Resp (!) 22   Ht 5' 3" (1.6 m)   Wt 129.7 kg   SpO2 92%   BMI 50.66 kg/m  General:   Alert,  Well-developed, well-nourished, pleasant and cooperative in NAD Head:  Normocephalic and atraumatic. Lungs:  Clear throughout to auscultation.   No wheezes, crackles, or rhonchi. No acute distress. Heart:  Regular rate and rhythm; no murmurs, clicks, rubs,  or gallops. Abdomen:  Soft, nontender and nondistended. No masses, hepatosplenomegaly or hernias noted. Normal bowel sounds, without guarding, and without rebound.   Impression/Plan: Frances Hughes is now here to undergo a screening colonoscopy.  High risk screening examination.  Risks, benefits, limitations, imponderables and alternatives regarding colonoscopy have been reviewed with the patient. Questions have been answered. All parties  agreeable.     Notice:  This dictation was prepared with Dragon dictation along with smaller phrase technology. Any transcriptional errors that result from this process are unintentional and may not be corrected upon review. 

## 2018-04-16 NOTE — Transfer of Care (Signed)
Immediate Anesthesia Transfer of Care Note  Patient: Frances Hughes  Procedure(s) Performed: COLONOSCOPY WITH PROPOFOL (N/A ) POLYPECTOMY  Patient Location: PACU  Anesthesia Type:MAC  Level of Consciousness: awake, alert , oriented and patient cooperative  Airway & Oxygen Therapy: Patient Spontanous Breathing  Post-op Assessment: Report given to RN and Post -op Vital signs reviewed and stable  Post vital signs: Reviewed and stable  Last Vitals:  Vitals Value Taken Time  BP 85/59 04/16/2018  9:45 AM  Temp    Pulse 77 04/16/2018  9:47 AM  Resp 34 04/16/2018  9:47 AM  SpO2 87 % 04/16/2018  9:47 AM  Vitals shown include unvalidated device data.  Last Pain:  Vitals:   04/16/18 0915  TempSrc:   PainSc: 0-No pain      Patients Stated Pain Goal: 6 (73/56/70 1410)  Complications: No apparent anesthesia complications

## 2018-04-16 NOTE — Anesthesia Preprocedure Evaluation (Signed)
Anesthesia Evaluation  Patient identified by MRN, date of birth, ID band Patient awake    Reviewed: Allergy & Precautions, H&P , NPO status , Patient's Chart, lab work & pertinent test results, reviewed documented beta blocker date and time   Airway Mallampati: II  TM Distance: >3 FB Neck ROM: full    Dental  (+) Partial Lower, Partial Upper   Pulmonary sleep apnea , former smoker,    Pulmonary exam normal breath sounds clear to auscultation       Cardiovascular Exercise Tolerance: Good hypertension, + Valvular Problems/Murmurs  Rhythm:regular Rate:Normal     Neuro/Psych PSYCHIATRIC DISORDERS Depression negative neurological ROS     GI/Hepatic Neg liver ROS, GERD  ,  Endo/Other  diabetesHypothyroidism Hyperthyroidism Morbid obesity  Renal/GU Renal disease  negative genitourinary   Musculoskeletal   Abdominal   Peds  Hematology negative hematology ROS (+)   Anesthesia Other Findings   Reproductive/Obstetrics negative OB ROS                             Anesthesia Physical Anesthesia Plan  ASA: III  Anesthesia Plan: MAC   Post-op Pain Management:    Induction:   PONV Risk Score and Plan:   Airway Management Planned:   Additional Equipment:   Intra-op Plan:   Post-operative Plan:   Informed Consent: I have reviewed the patients History and Physical, chart, labs and discussed the procedure including the risks, benefits and alternatives for the proposed anesthesia with the patient or authorized representative who has indicated his/her understanding and acceptance.   Dental Advisory Given  Plan Discussed with: CRNA  Anesthesia Plan Comments:         Anesthesia Quick Evaluation

## 2018-04-16 NOTE — Discharge Instructions (Signed)

## 2018-04-16 NOTE — Op Note (Signed)
[facility] Patient Name: Frances Hughes Procedure Date: 04/16/2018 9:09 AM MRN: [id number] Date of Birth: [birth year] Attending MD: Norvel Richards , MD CSN: [id number] Age: 62 Admit Type: Outpatient Procedure:                Colonoscopy Indications:              Screening in patient at increased risk: Family                            history of 1st-degree relative with colorectal                            cancer Providers:                Norvel Richards, MD, Gerome Sam, RN,                            Aram Candela Referring MD:              Medicines:                Propofol per Anesthesia Complications:            No immediate complications. Estimated Blood Loss:     Estimated blood loss was minimal. Procedure:                Pre-Anesthesia Assessment:                           - Prior to the procedure, a History and Physical                            was performed, and patient medications and                            allergies were reviewed. The patient's tolerance of                            previous anesthesia was also reviewed. The risks                            and benefits of the procedure and the sedation                            options and risks were discussed with the patient.                            All questions were answered, and informed consent                            was obtained. Prior Anticoagulants: The patient has                            taken no previous anticoagulant or antiplatelet                            agents. ASA Grade Assessment:  II - A patient with                            mild systemic disease. After reviewing the risks                            and benefits, the patient was deemed in                            satisfactory condition to undergo the procedure.                           After obtaining informed consent, the colonoscope                            was passed under direct vision. Throughout the                             procedure, the patient's blood pressure, pulse, and                            oxygen saturations were monitored continuously. The                            [id number]-[id number] ([id number]) scope was introduced through                            the and advanced to the the cecum, identified by                            appendiceal orifice and ileocecal valve. The                            colonoscopy was performed without difficulty. The                            patient tolerated the procedure well. The quality                            of the bowel preparation was adequate. Scope In: 9:22:08 AM Scope Out: 9:39:43 AM Scope Withdrawal Time: 0 hours 13 minutes 1 second  Total Procedure Duration: 0 hours 17 minutes 35 seconds  Findings:      The perianal and digital rectal examinations were normal.      Four semi-pedunculated polyps were found in the ascending colon. The       polyps were 4 to 6 mm in size. These polyps were removed with a cold       snare. Resection and retrieval were complete. Estimated blood loss was       minimal.      The exam was otherwise without abnormality on direct and retroflexion       views. Impression:               - Four 4 to 6 mm polyps in the ascending colon,  removed with a cold snare. Resected and retrieved.                           - The examination was otherwise normal on direct                            and retroflexion views. Moderate Sedation:      Moderate (conscious) sedation was personally administered by an       anesthesia professional. The following parameters were monitored: oxygen       saturation, heart rate, blood pressure, and response to care. Recommendation:           - Patient has a contact number available for                            emergencies. The signs and symptoms of potential                            delayed complications were discussed with the                             patient. Return to normal activities tomorrow.                            Written discharge instructions were provided to the                            patient.                           - Advance diet as tolerated.                           - Continue present medications.                           - Repeat colonoscopy date to be determined after                            pending pathology results are reviewed for                            surveillance based on pathology results.                           - Return to GI office (date not yet determined). Procedure Code(s):        --- Professional ---                           [phone], Colonoscopy, flexible; with removal of                            tumor(s), polyp(s), or other lesion(s) by snare                            technique Diagnosis  Code(s):        --- Professional ---                           Z80.0, Family history of malignant neoplasm of                            digestive organs                           D12.2, Benign neoplasm of ascending colon CPT copyright 2018 [employer]. All rights reserved. The codes documented in this report are preliminary and upon coder review may  be revised to meet current compliance requirements. Cristopher Estimable. [name], MD Norvel Richards, MD 04/16/2018 9:47:07 AM This report has been signed electronically. Number of Addenda: 0

## 2018-04-16 NOTE — Anesthesia Postprocedure Evaluation (Signed)
Anesthesia Post Note  Patient: Elaina H Mcmillen  Procedure(s) Performed: COLONOSCOPY WITH PROPOFOL (N/A ) POLYPECTOMY  Patient location during evaluation: PACU Anesthesia Type: MAC Level of consciousness: awake and alert and oriented Pain management: pain level controlled Vital Signs Assessment: post-procedure vital signs reviewed and stable Respiratory status: spontaneous breathing Cardiovascular status: stable Postop Assessment: no apparent nausea or vomiting Anesthetic complications: no     Last Vitals:  Vitals:   04/16/18 0908  BP: (!) 194/95  Pulse: 80  Resp: (!) 22  Temp: 37.2 C  SpO2: 92%    Last Pain:  Vitals:   04/16/18 0915  TempSrc:   PainSc: 0-No pain                 ADAMS, AMY A

## 2018-04-17 ENCOUNTER — Encounter: Payer: Self-pay | Admitting: Internal Medicine

## 2018-04-20 ENCOUNTER — Other Ambulatory Visit: Payer: Self-pay | Admitting: Family Medicine

## 2018-04-23 ENCOUNTER — Encounter (HOSPITAL_COMMUNITY): Payer: Self-pay | Admitting: Internal Medicine

## 2018-06-22 ENCOUNTER — Ambulatory Visit: Payer: Self-pay | Admitting: Pulmonary Disease

## 2018-06-25 ENCOUNTER — Ambulatory Visit: Payer: BLUE CROSS/BLUE SHIELD | Admitting: Family Medicine

## 2018-06-27 ENCOUNTER — Encounter: Payer: Self-pay | Admitting: Family Medicine

## 2018-06-30 ENCOUNTER — Other Ambulatory Visit: Payer: Self-pay | Admitting: Family Medicine

## 2018-07-18 ENCOUNTER — Other Ambulatory Visit: Payer: Self-pay | Admitting: Family Medicine

## 2018-07-23 ENCOUNTER — Ambulatory Visit: Payer: Self-pay | Admitting: Pulmonary Disease

## 2018-08-03 ENCOUNTER — Ambulatory Visit: Payer: Self-pay | Admitting: Nurse Practitioner

## 2018-10-06 ENCOUNTER — Other Ambulatory Visit: Payer: Self-pay | Admitting: Family Medicine

## 2018-11-06 NOTE — Progress Notes (Signed)
Patient: Frances Hughes, Female    DOB: Dec 13, 1955, 63 y.o.   MRN: [id number] Visit Date: 11/07/2018  Today's Provider: Delsa Grana, PA-C   Chief Complaint  Patient presents with  . Annual Exam   Subjective:    Annual physical exam Frances Hughes is a 63 y.o. female who presents today for health maintenance and complete physical. She feels well. She reports not exercising . She reports she is sleeping well.  Feeling tied to her desk at work, sits at desk, no breaks, not eating well.  She would like to change this but her work demand right now is very high. No exercise or diet program right now  ----------------------------------------------------------------- [facility] for DM exam - its been a few years since going there  [facility] - has paperwork to do mammogram  HTN - spreading out meds sometimes feeling dizzy, not monitoring BP, no syncopal episodes, no associated CP or SOB  DM - metformin checks sugars occasionally, but not recently  Hx of HLD? Not on statin, again to particular diet   Review of Systems  Social History      She  reports that she quit smoking about 30 years ago. Her smoking use included cigarettes. She started smoking about 49 years ago. She has a 7.50 pack-year smoking history. She has never used smokeless tobacco. She reports that she does not drink alcohol or use drugs.       Social History   Socioeconomic History  . Marital status: Legally Separated    Spouse name: Not on file  . Number of children: Not on file  . Years of education: Not on file  . Highest education level: Not on file  Occupational History  . Not on file  Social Needs  . Financial resource strain: Not on file  . Food insecurity    Worry: Not on file    Inability: Not on file  . Transportation needs    Medical: Not on file    Non-medical: Not on file  Tobacco Use  . Smoking status: Former Smoker    Packs/day: 0.75    Years: 10.00    Pack years: 7.50     Types: Cigarettes    Start date: 01/30/1969    Quit date: 05/23/1988    Years since quitting: 30.4  . Smokeless tobacco: Never Used  Substance and Sexual Activity  . Alcohol use: No    Alcohol/week: 0.0 standard drinks  . Drug use: No  . Sexual activity: Yes  Lifestyle  . Physical activity    Days per week: Not on file    Minutes per session: Not on file  . Stress: Not on file  Relationships  . Social Herbalist on phone: Not on file    Gets together: Not on file    Attends religious service: Not on file    Active member of club or organization: Not on file    Attends meetings of clubs or organizations: Not on file    Relationship status: Not on file  Other Topics Concern  . Not on file  Social History Narrative  . Not on file    Past Medical History:  Diagnosis Date  . Adrenal mass, left ([place])   . DM (diabetes mellitus) ([place])    Type II controlled  . Enlarged heart   . Grave's disease   . HTN (hypertension)   . Hyperlipidemia   . Lymphocytosis   . Nephrolithiasis   .  OSA on CPAP      Patient Active Problem List   Diagnosis Date Noted  . Encounter for screening colonoscopy 03/14/2018  . GERD (gastroesophageal reflux disease) 07/17/2015  . Lumbago 12/30/2014  . Routine general medical examination at a health care facility 12/06/2013  . Hoarse voice quality 12/06/2013  . Diastolic dysfunction 16/02/9603  . SOB (shortness of breath) 11/08/2013  . Systolic murmur 54/01/8118  . Urinary incontinence 09/16/2012  . Leg edema 04/05/2012  . Localized swelling, mass and lump, neck 07/15/2011  . Depression (emotion) 07/15/2011  . Obesity 07/15/2011  . Scar condition and fibrosis of skin 03/13/2011  . Hypothyroidism 04/05/2006  . Diabetes mellitus, type II ([place]) 04/05/2006  . Adrenal mass, left ([place]) 04/05/2006  . Hyperlipidemia 04/05/2006  . OSA (obstructive sleep apnea) 04/05/2006  . Essential hypertension 04/05/2006    Past Surgical History:   Procedure Laterality Date  . ABDOMINAL HYSTERECTOMY    . COLONOSCOPY  2011   normal colonoscopy  . COLONOSCOPY WITH PROPOFOL N/A 04/16/2018   Procedure: COLONOSCOPY WITH PROPOFOL;  Surgeon: Daneil Dolin, MD;  Location: [facility];  Service: Endoscopy;  Laterality: N/A;  9:45am  . HERNIA REPAIR  2011  . LITHOTRIPSY    . POLYPECTOMY  04/16/2018   Procedure: POLYPECTOMY;  Surgeon: Daneil Dolin, MD;  Location: [facility];  Service: Endoscopy;;  (colon)  . S/P Hysterectomy  10/1996   with bilat SOO seconardt to fibroids  . thyroid ablation  1986    Family History        Family Status  Relation Name Status  . Mother  Deceased at age 40       Metastatic Cancer   . Sister  Alive  . Sister  Alive       liver cancer in remission/COPD  . Father  Deceased at age 65       DM/ Lung Cancer  . Sister  Alive       removal of Mouth Cancer  . Sister  Alive        Her family history includes Cancer in her sister; Colon cancer in her mother and sister; Liver cancer in her sister.      No Known Allergies   Current Outpatient Medications:  .  amLODipine (NORVASC) 10 MG tablet, TAKE 1 TABLET BY MOUTH ONCE DAILY., Disp: 90 tablet, Rfl: 3 .  aspirin 81 MG tablet, Take 1 tablet (81 mg total) by mouth daily., Disp: 30 tablet, Rfl: 1 .  Blood Glucose Monitoring Suppl (BLOOD GLUCOSE MONITOR SYSTEM) W/DEVICE KIT, Please dispense based on patient and insurance preference. Use to monitor fasting blood sugar 1x daily. Dx: E11.9, Disp: 1 each, Rfl: 0 .  carvedilol (COREG) 25 MG tablet, Take 1 tablet (25 mg total) by mouth 2 (two) times daily., Disp: 180 tablet, Rfl: 3 .  escitalopram (LEXAPRO) 20 MG tablet, TAKE ONE TABLET BY MOUTH AT BEDTIME., Disp: 90 tablet, Rfl: 0 .  furosemide (LASIX) 20 MG tablet, TAKE (1) TABLET BY MOUTH ONCE DAILY AS NEEDED., Disp: 90 tablet, Rfl: 3 .  Glucose Blood (BLOOD GLUCOSE TEST STRIPS) STRP, Please dispense based on patient and insurance preference. Use to monitor  fasting blood sugar 1x daily. Dx: E11.9, Disp: 100 each, Rfl: 0 .  Lancets 28G MISC, Please dispense based on patient and insurance preference. Use to monitor fasting blood sugar 1x daily. Dx: E11.9, Disp: 100 each, Rfl: 0 .  levothyroxine (SYNTHROID) 200 MCG tablet, TAKE 1 TABLET BY MOUTH  ONCE DAILY BEFORE BREAKFAST., Disp: 30 tablet, Rfl: 0 .  levothyroxine (SYNTHROID, LEVOTHROID) 25 MCG tablet, TAKE 1 TABLET IN THE MORNING BEFORE BREAKFAST. (TAKE WITH 200MCG TAB), Disp: 30 tablet, Rfl: 10 .  losartan (COZAAR) 100 MG tablet, TAKE (1) TABLET BY MOUTH DAILY FOR HIGH BLOOD PRESSURE., Disp: 90 tablet, Rfl: 3 .  metFORMIN (GLUCOPHAGE) 500 MG tablet, Take 1.5 tablets (750 mg total) by mouth 2 (two) times daily with a meal. (Patient taking differently: Take 500 mg by mouth 2 (two) times daily with a meal. ), Disp: 90 tablet, Rfl: 3 .  Multiple Vitamins-Minerals (CENTRUM SILVER PO), Take 1 tablet by mouth daily., Disp: , Rfl:  .  potassium chloride SA (K-DUR,KLOR-CON) 20 MEQ tablet, Take 1 tablet (20 mEq total) by mouth daily., Disp: 90 tablet, Rfl: 3   Patient Care Team: [place], Modena Nunnery, MD as PCP - General (Family Medicine) Harl Bowie, Alphonse Guild, MD as PCP - Cardiology (Cardiology) Daneil Dolin, MD as Consulting Physician (Gastroenterology)      Objective:   Vitals: BP (!) 142/78   Pulse 75   Temp 98.3 F (36.8 C)   Resp 18   Ht '5\' 3"'  (1.6 m)   Wt 286 lb 3.2 oz (129.8 kg)   SpO2 95%   BMI 50.70 kg/m    Vitals:   11/07/18 0836  BP: (!) 142/78  Pulse: 75  Resp: 18  Temp: 98.3 F (36.8 C)  SpO2: 95%  Weight: 286 lb 3.2 oz (129.8 kg)  Height: '5\' 3"'  (1.6 m)     Physical Exam Vitals signs and nursing note reviewed.  Constitutional:      General: She is not in acute distress.    Appearance: Normal appearance. She is well-developed. She is obese. She is not ill-appearing, toxic-appearing or diaphoretic.     Comments: Morbidly obese, well appearing female, appears younger than  stated age  HENT:     Head: Normocephalic and atraumatic.     Right Ear: External ear normal.     Left Ear: External ear normal.     Nose: Nose normal. No congestion.     Mouth/Throat:     Mouth: Mucous membranes are moist.     Pharynx: Oropharynx is clear. Uvula midline. No oropharyngeal exudate or posterior oropharyngeal erythema.  Eyes:     General: Lids are normal. No scleral icterus.    Conjunctiva/sclera: Conjunctivae normal.     Pupils: Pupils are equal, round, and reactive to light.  Neck:     Musculoskeletal: Normal range of motion and neck supple.     Trachea: Phonation normal. No tracheal deviation.  Cardiovascular:     Rate and Rhythm: Normal rate and regular rhythm.     Pulses: Normal pulses.          Radial pulses are 2+ on the right side and 2+ on the left side.       Posterior tibial pulses are 2+ on the right side and 2+ on the left side.     Heart sounds: Normal heart sounds. No murmur. No friction rub. No gallop.   Pulmonary:     Effort: Pulmonary effort is normal. No respiratory distress.     Breath sounds: Normal breath sounds. No stridor. No wheezing, rhonchi or rales.  Chest:     Chest wall: No tenderness.  Abdominal:     General: Bowel sounds are normal. There is no distension.     Palpations: Abdomen is soft.     Tenderness: There  is no abdominal tenderness. There is no guarding or rebound.  Musculoskeletal:        General: No deformity.     Right lower leg: Edema present.     Left lower leg: Edema present.  Lymphadenopathy:     Cervical: No cervical adenopathy.  Skin:    General: Skin is warm and dry.     Capillary Refill: Capillary refill takes less than 2 seconds.     Coloration: Skin is not jaundiced or pale.     Findings: No bruising, erythema or rash.  Neurological:     Mental Status: She is alert and oriented to person, place, and time.     Motor: No abnormal muscle tone.     Gait: Gait normal.  Psychiatric:        Mood and Affect: Mood  normal.        Speech: Speech normal.        Behavior: Behavior normal.        Thought Content: Thought content normal.        Judgment: Judgment normal.     Diabetic foot exam completed - see EMR  Depression Screen PHQ 2/9 Scores 11/07/2018 03/23/2018 12/15/2017 10/13/2017  PHQ - 2 Score '2 5 4 5  ' PHQ- 9 Score '6 5 11 13     ' Office Visit from 03/23/2018 in [facility]  AUDIT-C Score  0         Assessment & Plan:     Routine Health Maintenance and Physical Exam  Exercise Activities and Dietary recommendations Goals - increase physical activity as able, improve diet, calorie reduction and weightloss      Discussed health benefits of physical activity, and encouraged her to engage in regular exercise appropriate for her age and condition.   Immunization History  Administered Date(s) Administered  . Influenza-Unspecified 03/21/2011, 03/20/2015  . Pneumococcal Polysaccharide-23 04/13/2007  . Tdap 09/14/2012    Health Maintenance  Topic Date Due  . FOOT EXAM  04/02/2016  . OPHTHALMOLOGY EXAM  08/16/2016  . HEMOGLOBIN A1C  09/21/2018  . MAMMOGRAM  11/02/2018  . INFLUENZA VACCINE  12/22/2018  . TETANUS/TDAP  09/15/2022  . COLONOSCOPY  04/17/2023  . Hepatitis C Screening  Completed  . HIV Screening  Completed     Cholesterol/statin, on them in the past, no problems with meds, has lost weight before and improved cholesterol  Problem List Items Addressed This Visit      Cardiovascular and Mediastinum   Essential hypertension    Poor med compliance, sometimes feels dizzy BP mildly elevated today B/l 2+ pitting edema - is her normal Check labs Encouraged low salt diet, weightloss, improved compliance with meds and BP monitoring to see if dizziness is related to BP or other cause (she doesn't eat or drink regularly either)       Relevant Orders   COMPLETE METABOLIC PANEL WITH GFR (Completed)     Endocrine   Hypothyroidism    Did not come back for  TSH recheck after last med adjustment Check TSH today      Relevant Orders   TSH (Completed)   Diabetes mellitus, type II ([place])    Compliant with metformin, not monitoring sugars No diet and exercise Foot exam done today Not on statin?  Check CMP, A1C, microalbumin      Relevant Orders   Ambulatory referral to Ophthalmology   Microalbumin, urine (Completed)   COMPLETE METABOLIC PANEL WITH GFR (Completed)   Hemoglobin A1c (Completed)  Ambulatory referral to Podiatry     Other   Hyperlipidemia    Not on meds though they would be indicated due to DM dx alone Check CMP and FLP Lifestyle, diet and exercise      Relevant Orders   Lipid panel (Completed)   COMPLETE METABOLIC PANEL WITH GFR (Completed)    Other Visit Diagnoses    General medical exam    -  Primary   Relevant Orders   TSH (Completed)   Microalbumin, urine (Completed)   Lipid panel (Completed)   COMPLETE METABOLIC PANEL WITH GFR (Completed)   CBC with Differential/Platelet (Completed)   Hemoglobin A1c (Completed)   Breast cancer screening       does with GYN   Nail fungal infection       Relevant Orders   Ambulatory referral to [place], PA-C 11/07/18 8:57 AM  [facility]

## 2018-11-06 NOTE — Patient Instructions (Addendum)
American Heart Association (AHA) Exercise Recommendation  Being physically active is important to prevent heart disease and stroke, the nation's No. 1and No. 5killers. To improve overall cardiovascular health, we suggest at least 150 minutes per week of moderate exercise or 75 minutes per week of vigorous exercise (or a combination of moderate and vigorous activity). Thirty minutes a day, five times a week is an easy goal to remember. You will also experience benefits even if you divide your time into two or three segments of 10 to 15 minutes per day.  For people who would benefit from lowering their blood pressure or cholesterol, we recommend 40 minutes of aerobic exercise of moderate to vigorous intensity three to four times a week to lower the risk for heart attack and stroke.  Physical activity is anything that makes you move your body and burn calories.  This includes things like climbing stairs or playing sports. Aerobic exercises benefit your heart, and include walking, jogging, swimming or biking. Strength and stretching exercises are best for overall stamina and flexibility.  The simplest, positive change you can make to effectively improve your heart health is to start walking. It's enjoyable, free, easy, social and great exercise. A walking program is flexible and boasts high success rates because people can stick with it. It's easy for walking to become a regular and satisfying part of life.   For Overall Cardiovascular Health:  At least 30 minutes of moderate-intensity aerobic activity at least 5 days per week for a total of 150  OR   At least 25 minutes of vigorous aerobic activity at least 3 days per week for a total of 75 minutes; or a combination of moderate- and vigorous-intensity aerobic activity  AND   Moderate- to high-intensity muscle-strengthening activity at least 2 days per week for additional health benefits.  For Lowering Blood Pressure and Cholesterol  An  average 40 minutes of moderate- to vigorous-intensity aerobic activity 3 or 4 times per week  What if I can't make it to the time goal? Something is always better than nothing! And everyone has to start somewhere. Even if you've been sedentary for years, today is the day you can begin to make healthy changes in your life. If you don't think you'll make it for 30 or 40 minutes, set a reachable goal for today. You can work up toward your overall goal by increasing your time as you get stronger. Don't let all-or-nothing thinking rob you of doing what you can every day.  Source:http://www.heart.org      Health Maintenance, Female Adopting a healthy lifestyle and getting preventive care can go a long way to promote health and wellness. Talk with your health care provider about what schedule of regular examinations is right for you. This is a good chance for you to check in with your provider about disease prevention and staying healthy. In between checkups, there are plenty of things you can do on your own. Experts have done a lot of research about which lifestyle changes and preventive measures are most likely to keep you healthy. Ask your health care provider for more information. Weight and diet Eat a healthy diet  Be sure to include plenty of vegetables, fruits, low-fat dairy products, and lean protein.  Do not eat a lot of foods high in solid fats, added sugars, or salt.  Get regular exercise. This is one of the most important things you can do for your health. ? Most adults should exercise for at least 150  minutes each week. The exercise should increase your heart rate and make you sweat (moderate-intensity exercise). ? Most adults should also do strengthening exercises at least twice a week. This is in addition to the moderate-intensity exercise. Maintain a healthy weight  Body mass index (BMI) is a measurement that can be used to identify possible weight problems. It estimates body fat  based on height and weight. Your health care provider can help determine your BMI and help you achieve or maintain a healthy weight.  For females 6 years of age and older: ? A BMI below 18.5 is considered underweight. ? A BMI of 18.5 to 24.9 is normal. ? A BMI of 25 to 29.9 is considered overweight. ? A BMI of 30 and above is considered obese. Watch levels of cholesterol and blood lipids  You should start having your blood tested for lipids and cholesterol at 65 years of age, then have this test every 5 years.  You may need to have your cholesterol levels checked more often if: ? Your lipid or cholesterol levels are high. ? You are older than 65 years of age. ? You are at high risk for heart disease. Cancer screening Lung Cancer  Lung cancer screening is recommended for adults 58-21 years old who are at high risk for lung cancer because of a history of smoking.  A yearly low-dose CT scan of the lungs is recommended for people who: ? Currently smoke. ? Have quit within the past 15 years. ? Have at least a 30-pack-year history of smoking. A pack year is smoking an average of one pack of cigarettes a day for 1 year.  Yearly screening should continue until it has been 15 years since you quit.  Yearly screening should stop if you develop a health problem that would prevent you from having lung cancer treatment. Breast Cancer  Practice breast self-awareness. This means understanding how your breasts normally appear and feel.  It also means doing regular breast self-exams. Let your health care provider know about any changes, no matter how small.  If you are in your 20s or 30s, you should have a clinical breast exam (CBE) by a health care provider every 1-3 years as part of a regular health exam.  If you are 70 or older, have a CBE every year. Also consider having a breast X-ray (mammogram) every year.  If you have a family history of breast cancer, talk to your health care provider  about genetic screening.  If you are at high risk for breast cancer, talk to your health care provider about having an MRI and a mammogram every year.  Breast cancer gene (BRCA) assessment is recommended for women who have family members with BRCA-related cancers. BRCA-related cancers include: ? Breast. ? Ovarian. ? Tubal. ? Peritoneal cancers.  Results of the assessment will determine the need for genetic counseling and BRCA1 and BRCA2 testing. Cervical Cancer Your health care provider may recommend that you be screened regularly for cancer of the pelvic organs (ovaries, uterus, and vagina). This screening involves a pelvic examination, including checking for microscopic changes to the surface of your cervix (Pap test). You may be encouraged to have this screening done every 3 years, beginning at age 69.  For women ages 30-65, health care providers may recommend pelvic exams and Pap testing every 3 years, or they may recommend the Pap and pelvic exam, combined with testing for human papilloma virus (HPV), every 5 years. Some types of HPV increase your risk  of cervical cancer. Testing for HPV may also be done on women of any age with unclear Pap test results.  Other health care providers may not recommend any screening for nonpregnant women who are considered low risk for pelvic cancer and who do not have symptoms. Ask your health care provider if a screening pelvic exam is right for you.  If you have had past treatment for cervical cancer or a condition that could lead to cancer, you need Pap tests and screening for cancer for at least 20 years after your treatment. If Pap tests have been discontinued, your risk factors (such as having a new sexual partner) need to be reassessed to determine if screening should resume. Some women have medical problems that increase the chance of getting cervical cancer. In these cases, your health care provider may recommend more frequent screening and Pap  tests. Colorectal Cancer  This type of cancer can be detected and often prevented.  Routine colorectal cancer screening usually begins at 66 years of age and continues through 65 years of age.  Your health care provider may recommend screening at an earlier age if you have risk factors for colon cancer.  Your health care provider may also recommend using home test kits to check for hidden blood in the stool.  A small camera at the end of a tube can be used to examine your colon directly (sigmoidoscopy or colonoscopy). This is done to check for the earliest forms of colorectal cancer.  Routine screening usually begins at age 32.  Direct examination of the colon should be repeated every 5-10 years through 65 years of age. However, you may need to be screened more often if early forms of precancerous polyps or small growths are found. Skin Cancer  Check your skin from head to toe regularly.  Tell your health care provider about any new moles or changes in moles, especially if there is a change in a mole's shape or color.  Also tell your health care provider if you have a mole that is larger than the size of a pencil eraser.  Always use sunscreen. Apply sunscreen liberally and repeatedly throughout the day.  Protect yourself by wearing long sleeves, pants, a wide-brimmed hat, and sunglasses whenever you are outside. Heart disease, diabetes, and high blood pressure  High blood pressure causes heart disease and increases the risk of stroke. High blood pressure is more likely to develop in: ? People who have blood pressure in the high end of the normal range (130-139/85-89 mm Hg). ? People who are overweight or obese. ? People who are African American.  If you are 35-53 years of age, have your blood pressure checked every 3-5 years. If you are 58 years of age or older, have your blood pressure checked every year. You should have your blood pressure measured twice-once when you are at a  hospital or clinic, and once when you are not at a hospital or clinic. Record the average of the two measurements. To check your blood pressure when you are not at a hospital or clinic, you can use: ? An automated blood pressure machine at a pharmacy. ? A home blood pressure monitor.  If you are between 52 years and 5 years old, ask your health care provider if you should take aspirin to prevent strokes.  Have regular diabetes screenings. This involves taking a blood sample to check your fasting blood sugar level. ? If you are at a normal weight and have a low  risk for diabetes, have this test once every three years after 65 years of age. ? If you are overweight and have a high risk for diabetes, consider being tested at a younger age or more often. Preventing infection Hepatitis B  If you have a higher risk for hepatitis B, you should be screened for this virus. You are considered at high risk for hepatitis B if: ? You were born in a country where hepatitis B is common. Ask your health care provider which countries are considered high risk. ? Your parents were born in a high-risk country, and you have not been immunized against hepatitis B (hepatitis B vaccine). ? You have HIV or AIDS. ? You use needles to inject street drugs. ? You live with someone who has hepatitis B. ? You have had sex with someone who has hepatitis B. ? You get hemodialysis treatment. ? You take certain medicines for conditions, including cancer, organ transplantation, and autoimmune conditions. Hepatitis C  Blood testing is recommended for: ? Everyone born from [birth year] through 1967. ? Anyone with known risk factors for hepatitis C. Sexually transmitted infections (STIs)  You should be screened for sexually transmitted infections (STIs) including gonorrhea and chlamydia if: ? You are sexually active and are younger than 65 years of age. ? You are older than 65 years of age and your health care provider tells you  that you are at risk for this type of infection. ? Your sexual activity has changed since you were last screened and you are at an increased risk for chlamydia or gonorrhea. Ask your health care provider if you are at risk.  If you do not have HIV, but are at risk, it may be recommended that you take a prescription medicine daily to prevent HIV infection. This is called pre-exposure prophylaxis (PrEP). You are considered at risk if: ? You are sexually active and do not regularly use condoms or know the HIV status of your partner(s). ? You take drugs by injection. ? You are sexually active with a partner who has HIV. Talk with your health care provider about whether you are at high risk of being infected with HIV. If you choose to begin PrEP, you should first be tested for HIV. You should then be tested every 3 months for as long as you are taking PrEP. Pregnancy  If you are premenopausal and you may become pregnant, ask your health care provider about preconception counseling.  If you may become pregnant, take 400 to 800 micrograms (mcg) of folic acid every day.  If you want to prevent pregnancy, talk to your health care provider about birth control (contraception). Osteoporosis and menopause  Osteoporosis is a disease in which the bones lose minerals and strength with aging. This can result in serious bone fractures. Your risk for osteoporosis can be identified using a bone density scan.  If you are 2 years of age or older, or if you are at risk for osteoporosis and fractures, ask your health care provider if you should be screened.  Ask your health care provider whether you should take a calcium or vitamin D supplement to lower your risk for osteoporosis.  Menopause may have certain physical symptoms and risks.  Hormone replacement therapy may reduce some of these symptoms and risks. Talk to your health care provider about whether hormone replacement therapy is right for you. Follow  these instructions at home:  Schedule regular health, dental, and eye exams.  Stay current with your immunizations.  Do not use any tobacco products including cigarettes, chewing tobacco, or electronic cigarettes.  If you are pregnant, do not drink alcohol.  If you are breastfeeding, limit how much and how often you drink alcohol.  Limit alcohol intake to no more than 1 drink per day for nonpregnant women. One drink equals 12 ounces of beer, 5 ounces of wine, or 1 ounces of hard liquor.  Do not use street drugs.  Do not share needles.  Ask your health care provider for help if you need support or information about quitting drugs.  Tell your health care provider if you often feel depressed.  Tell your health care provider if you have ever been abused or do not feel safe at home. This information is not intended to replace advice given to you by your health care provider. Make sure you discuss any questions you have with your health care provider. Document Released: 11/22/2010 Document Revised: 10/15/2015 Document Reviewed: 02/10/2015 Elsevier Interactive Patient Education  2019 Elsevier Inc.  

## 2018-11-07 ENCOUNTER — Ambulatory Visit (INDEPENDENT_AMBULATORY_CARE_PROVIDER_SITE_OTHER): Payer: BC Managed Care – PPO | Admitting: Family Medicine

## 2018-11-07 ENCOUNTER — Other Ambulatory Visit: Payer: Self-pay

## 2018-11-07 VITALS — BP 142/78 | HR 75 | Temp 98.3°F | Resp 18 | Ht 63.0 in | Wt 286.2 lb

## 2018-11-07 DIAGNOSIS — E038 Other specified hypothyroidism: Secondary | ICD-10-CM

## 2018-11-07 DIAGNOSIS — I10 Essential (primary) hypertension: Secondary | ICD-10-CM | POA: Diagnosis not present

## 2018-11-07 DIAGNOSIS — E1149 Type 2 diabetes mellitus with other diabetic neurological complication: Secondary | ICD-10-CM

## 2018-11-07 DIAGNOSIS — E782 Mixed hyperlipidemia: Secondary | ICD-10-CM | POA: Diagnosis not present

## 2018-11-07 DIAGNOSIS — Z0001 Encounter for general adult medical examination with abnormal findings: Secondary | ICD-10-CM

## 2018-11-07 DIAGNOSIS — Z0000 Encounter for general adult medical examination without abnormal findings: Secondary | ICD-10-CM | POA: Diagnosis not present

## 2018-11-07 DIAGNOSIS — Z1239 Encounter for other screening for malignant neoplasm of breast: Secondary | ICD-10-CM

## 2018-11-07 DIAGNOSIS — B351 Tinea unguium: Secondary | ICD-10-CM

## 2018-11-07 MED ORDER — ZOSTER VAC RECOMB ADJUVANTED 50 MCG/0.5ML IM SUSR: 0.5000 mL | Freq: Once | INTRAMUSCULAR | 1 refills | Status: AC

## 2018-11-08 ENCOUNTER — Encounter: Payer: Self-pay | Admitting: Family Medicine

## 2018-11-08 LAB — CBC WITH DIFFERENTIAL/PLATELET
Absolute Monocytes: 462 cells/uL (ref 200–950)
Basophils Absolute: 41 cells/uL (ref 0–200)
Basophils Relative: 0.5 %
Eosinophils Absolute: 203 cells/uL (ref 15–500)
Eosinophils Relative: 2.5 %
HCT: 44.9 % (ref 35.0–45.0)
Hemoglobin: 13.6 g/dL (ref 11.7–15.5)
Lymphs Abs: 2082 cells/uL (ref 850–3900)
MCH: 24.7 pg — ABNORMAL LOW (ref 27.0–33.0)
MCHC: 30.3 g/dL — ABNORMAL LOW (ref 32.0–36.0)
MCV: 81.6 fL (ref 80.0–100.0)
MPV: 11.1 fL (ref 7.5–12.5)
Monocytes Relative: 5.7 %
Neutro Abs: 5314 cells/uL (ref 1500–7800)
Neutrophils Relative %: 65.6 %
Platelets: 310 10*3/uL (ref 140–400)
RBC: 5.5 10*6/uL — ABNORMAL HIGH (ref 3.80–5.10)
RDW: 14.2 % (ref 11.0–15.0)
Total Lymphocyte: 25.7 %
WBC: 8.1 10*3/uL (ref 3.8–10.8)

## 2018-11-08 LAB — COMPLETE METABOLIC PANEL WITH GFR
AG Ratio: 1 (calc) (ref 1.0–2.5)
ALT: 23 U/L (ref 6–29)
AST: 30 U/L (ref 10–35)
Albumin: 4 g/dL (ref 3.6–5.1)
Alkaline phosphatase (APISO): 88 U/L (ref 37–153)
BUN: 11 mg/dL (ref 7–25)
CO2: 31 mmol/L (ref 20–32)
Calcium: 9.7 mg/dL (ref 8.6–10.4)
Chloride: 98 mmol/L (ref 98–110)
Creat: 0.65 mg/dL (ref 0.50–0.99)
GFR, Est African American: 109 mL/min/{1.73_m2} (ref >=60)
GFR, Est Non African American: 94 mL/min/{1.73_m2} (ref >=60)
Globulin: 3.9 g/dL (calc) — ABNORMAL HIGH (ref 1.9–3.7)
Glucose, Bld: 114 mg/dL — ABNORMAL HIGH (ref 65–99)
Potassium: 4 mmol/L (ref 3.5–5.3)
Sodium: 143 mmol/L (ref 135–146)
Total Bilirubin: 0.5 mg/dL (ref 0.2–1.2)
Total Protein: 7.9 g/dL (ref 6.1–8.1)

## 2018-11-08 LAB — LIPID PANEL
Cholesterol: 197 mg/dL (ref <200)
HDL: 60 mg/dL (ref >=50)
LDL Cholesterol (Calc): 120 mg/dL (calc) — ABNORMAL HIGH
Non-HDL Cholesterol (Calc): 137 mg/dL (calc) — ABNORMAL HIGH (ref <130)
Total CHOL/HDL Ratio: 3.3 (calc) (ref <5.0)
Triglycerides: 73 mg/dL (ref <150)

## 2018-11-08 LAB — HEMOGLOBIN A1C
Hgb A1c MFr Bld: 6.3 % of total Hgb — ABNORMAL HIGH (ref <5.7)
Mean Plasma Glucose: 134 (calc)
eAG (mmol/L): 7.4 (calc)

## 2018-11-08 LAB — MICROALBUMIN, URINE: Microalb, Ur: 2.2 mg/dL

## 2018-11-08 LAB — TSH: TSH: 8.47 mIU/L — ABNORMAL HIGH (ref 0.40–4.50)

## 2018-11-08 NOTE — Assessment & Plan Note (Signed)
Poor med compliance, sometimes feels dizzy BP mildly elevated today B/l 2+ pitting edema - is her normal Check labs Encouraged low salt diet, weightloss, improved compliance with meds and BP monitoring to see if dizziness is related to BP or other cause (she doesn't eat or drink regularly either)

## 2018-11-08 NOTE — Assessment & Plan Note (Signed)
Did not come back for TSH recheck after last med adjustment Check TSH today

## 2018-11-08 NOTE — Assessment & Plan Note (Signed)
Not on meds though they would be indicated due to DM dx alone Check CMP and FLP Lifestyle, diet and exercise

## 2018-11-08 NOTE — Assessment & Plan Note (Signed)
Compliant with metformin, not monitoring sugars No diet and exercise Foot exam done today Not on statin?  Check CMP, A1C, microalbumin

## 2018-11-12 ENCOUNTER — Other Ambulatory Visit (HOSPITAL_COMMUNITY): Payer: Self-pay | Admitting: Family Medicine

## 2018-11-12 DIAGNOSIS — Z1231 Encounter for screening mammogram for malignant neoplasm of breast: Secondary | ICD-10-CM

## 2018-11-13 ENCOUNTER — Other Ambulatory Visit: Payer: Self-pay | Admitting: *Deleted

## 2018-11-13 MED ORDER — ATORVASTATIN CALCIUM 20 MG PO TABS: 20.0000 mg | ORAL_TABLET | Freq: Every day | ORAL | 3 refills | Status: DC

## 2018-11-13 MED ORDER — LEVOTHYROXINE SODIUM 25 MCG PO TABS: ORAL_TABLET | ORAL | 10 refills | Status: DC

## 2018-11-15 ENCOUNTER — Other Ambulatory Visit: Payer: Self-pay

## 2018-11-15 ENCOUNTER — Ambulatory Visit (HOSPITAL_COMMUNITY)
Admission: RE | Admit: 2018-11-15 | Discharge: 2018-11-15 | Disposition: A | Discharge status: Home / Self Care | Payer: BC Managed Care – PPO | Source: Ambulatory Visit | Attending: Family Medicine | Admitting: Family Medicine

## 2018-11-15 DIAGNOSIS — Z1231 Encounter for screening mammogram for malignant neoplasm of breast: Secondary | ICD-10-CM

## 2018-11-23 ENCOUNTER — Other Ambulatory Visit: Payer: Self-pay | Admitting: Family Medicine

## 2018-11-29 DIAGNOSIS — R51 Headache: Secondary | ICD-10-CM | POA: Diagnosis not present

## 2018-11-30 ENCOUNTER — Telehealth: Payer: Self-pay | Admitting: *Deleted

## 2018-11-30 MED ORDER — AMOXICILLIN 875 MG PO TABS: 875.0000 mg | ORAL_TABLET | Freq: Two times a day (BID) | ORAL | 0 refills | Status: DC

## 2018-11-30 NOTE — Telephone Encounter (Signed)
Amoxicillin 875 mg pobid for 10 days

## 2018-11-30 NOTE — Telephone Encounter (Signed)
Received call from patient. [phone]~ telephone.   Reports that she has tooth abscess and cannot be seen at dentist office D/T COVID. States that she has pain and inflammation at the bad tooth with a metallic taste to mouth.   Requested prescription for ABTx until she can get into dentist.   MD please advise.

## 2018-11-30 NOTE — Telephone Encounter (Signed)
Call placed to patient and patient made aware.   Prescription sent to pharmacy.   Reports that appt with dentist has been scheduled for 12/05/2018.

## 2019-02-01 ENCOUNTER — Other Ambulatory Visit: Payer: Self-pay | Admitting: Family Medicine

## 2019-02-08 ENCOUNTER — Ambulatory Visit (INDEPENDENT_AMBULATORY_CARE_PROVIDER_SITE_OTHER): Payer: BC Managed Care – PPO | Admitting: Family Medicine

## 2019-02-08 ENCOUNTER — Encounter: Payer: Self-pay | Admitting: Family Medicine

## 2019-02-08 VITALS — BP 148/82 | HR 77 | Temp 98.5°F | Resp 14 | Ht 63.0 in | Wt 275.0 lb

## 2019-02-08 DIAGNOSIS — I10 Essential (primary) hypertension: Secondary | ICD-10-CM | POA: Diagnosis not present

## 2019-02-08 DIAGNOSIS — Z6841 Body Mass Index (BMI) 40.0 and over, adult: Secondary | ICD-10-CM

## 2019-02-08 DIAGNOSIS — I5189 Other ill-defined heart diseases: Secondary | ICD-10-CM

## 2019-02-08 DIAGNOSIS — E038 Other specified hypothyroidism: Secondary | ICD-10-CM

## 2019-02-08 DIAGNOSIS — E1149 Type 2 diabetes mellitus with other diabetic neurological complication: Secondary | ICD-10-CM

## 2019-02-08 DIAGNOSIS — R600 Localized edema: Secondary | ICD-10-CM

## 2019-02-08 DIAGNOSIS — Z23 Encounter for immunization: Secondary | ICD-10-CM | POA: Diagnosis not present

## 2019-02-08 NOTE — Assessment & Plan Note (Signed)
She is not symptomatic from her diastolic heart failure at this time.  Continue diuretics.

## 2019-02-08 NOTE — Assessment & Plan Note (Signed)
Currently diet-controlled her last A1c showed much improvement.  She has a podiatry appointment next month.  He is on ACE inhibitor we discussed the use of a statin drug we will have her start it 3 times a week at bedtime

## 2019-02-08 NOTE — Assessment & Plan Note (Signed)
Her blood pressure is well elevated however she has not had her medications or her diuretic.  Though even without medications this is much better than her previous readings.  I am going to check her fasting labs today.  Encourage checking her blood pressure at home.  Continue to work on dietary changes and weight loss

## 2019-02-08 NOTE — Progress Notes (Signed)
Subjective:    [name], female    DOB: 04-21-1956, 63 y.o.   MRN: [id number]  Patient presents for Follow-up (is fasting)  Pt here to f/u chronic medical problems  Medications reviewed  Has difficulty getting her meds in a ttimes due to nausea, feels like she is taking a lot    Hypothyroidism- taking 215mcg once a day in the morin   HTN- takes morning BP meds around 8am along   with lasix 10mg   She takes other 10mg  of lasix   Has not taken Meds this morning   Not wearing compression hose        Takes vitamins at lunch- zinc, vitamin C, D with mid morning     Obesity- weight down 11lbs ,trying to watch diet   Hyperlipidemia- has not cholesterol medication.  She is unclear when to take this states that she takes so much medication sometimes her stomach is upset did not want to add this to her morning meds.     DM -diet controlled, last A1C 6.3% in June     She is planning to retire in January   [place]:  GEN- denies fatigue, fever, weight loss,weakness, recent illness HEENT- denies eye drainage, change in vision, nasal discharge, CVS- denies chest pain, palpitations RESP- denies SOB, cough, wheeze ABD- denies N/V, change in stools, abd pain GU- denies dysuria, hematuria, dribbling, incontinence MSK- denies joint pain, muscle aches, injury Neuro- denies headache, dizziness, syncope, seizure activity       Objective:    BP (!) 148/82   Pulse 77   Temp 98.5 F (36.9 C) (Oral)   Resp 14   Ht 5\' 3"  (1.6 m)   Wt 275 lb (124.7 kg)   SpO2 93%   BMI 48.71 kg/m  HEENT- PERRL, EOMI, non injected sclera, pink conjunctiva, MMM, oropharynx clear Neck- Supple, no thyromegaly CVS- RRR, 3 /6 systolic murmur RESP-CTAB ABD-NABS,soft,NT,ND EXT- 1+ nonpitting edema L >R  Pulses- Radial, DP-palpated      Assessment & Plan:      Problem List Items Addressed This Visit      Unprioritized   Diabetes mellitus, type II ([place])    Currently  diet-controlled her last A1c showed much improvement.  She has a podiatry appointment next month.  He is on ACE inhibitor we discussed the use of a statin drug we will have her start it 3 times a week at bedtime      Relevant Orders   Lipid panel   Hemoglobin 123456   Diastolic dysfunction    She is not symptomatic from her diastolic heart failure at this time.  Continue diuretics.      Essential hypertension - Primary    Her blood pressure is well elevated however she has not had her medications or her diuretic.  Though even without medications this is much better than her previous readings.  I am going to check her fasting labs today.  Encourage checking her blood pressure at home.  Continue to work on dietary changes and weight loss      Relevant Orders   CBC with Differential/Platelet   Comprehensive metabolic panel   Hypothyroidism   Relevant Orders   TSH   Leg edema    While she does have chronic leg edema it is decreased compared to her previous visits.  I think this is both secondary to use of medication and she is losing weight.  Discussed the compression hose she states  it is just too hot for her to wear and she has difficulty getting them on therefore is not using them.  Continue Lasix and potassium      Obesity    Other Visit Diagnoses    Need for immunization against influenza       Relevant Orders   Flu Vaccine QUAD High Dose(Fluad) (Completed)      Note: This dictation was prepared with Dragon dictation along with smaller phrase technology. Any transcriptional errors that result from this process are unintentional.

## 2019-02-08 NOTE — Patient Instructions (Addendum)
Take lipitor three times a week at bedtime with lexapro  We will call with lab resul ts F/U 4 months

## 2019-02-08 NOTE — Assessment & Plan Note (Addendum)
While she does have chronic leg edema it is decreased compared to her previous visits.  I think this is both secondary to use of medication and she is losing weight.  Discussed the compression hose she states it is just too hot for her to wear and she has difficulty getting them on therefore is not using them.  Continue Lasix and potassium

## 2019-02-09 LAB — COMPREHENSIVE METABOLIC PANEL
AG Ratio: 1 (calc) (ref 1.0–2.5)
ALT: 16 U/L (ref 6–29)
AST: 20 U/L (ref 10–35)
Albumin: 3.5 g/dL — ABNORMAL LOW (ref 3.6–5.1)
Alkaline phosphatase (APISO): 72 U/L (ref 37–153)
BUN: 18 mg/dL (ref 7–25)
CO2: 33 mmol/L — ABNORMAL HIGH (ref 20–32)
Calcium: 9 mg/dL (ref 8.6–10.4)
Chloride: 98 mmol/L (ref 98–110)
Creat: 0.74 mg/dL (ref 0.50–0.99)
Globulin: 3.5 g/dL (calc) (ref 1.9–3.7)
Glucose, Bld: 132 mg/dL — ABNORMAL HIGH (ref 65–99)
Potassium: 3.4 mmol/L — ABNORMAL LOW (ref 3.5–5.3)
Sodium: 143 mmol/L (ref 135–146)
Total Bilirubin: 0.4 mg/dL (ref 0.2–1.2)
Total Protein: 7 g/dL (ref 6.1–8.1)

## 2019-02-09 LAB — LIPID PANEL
Cholesterol: 166 mg/dL (ref <200)
HDL: 50 mg/dL (ref >=50)
LDL Cholesterol (Calc): 101 mg/dL (calc) — ABNORMAL HIGH
Non-HDL Cholesterol (Calc): 116 mg/dL (calc) (ref <130)
Total CHOL/HDL Ratio: 3.3 (calc) (ref <5.0)
Triglycerides: 65 mg/dL (ref <150)

## 2019-02-09 LAB — CBC WITH DIFFERENTIAL/PLATELET
Absolute Monocytes: 490 cells/uL (ref 200–950)
Basophils Absolute: 28 cells/uL (ref 0–200)
Basophils Relative: 0.4 %
Eosinophils Absolute: 159 cells/uL (ref 15–500)
Eosinophils Relative: 2.3 %
HCT: 38.5 % (ref 35.0–45.0)
Hemoglobin: 12.2 g/dL (ref 11.7–15.5)
Lymphs Abs: 1622 cells/uL (ref 850–3900)
MCH: 25.3 pg — ABNORMAL LOW (ref 27.0–33.0)
MCHC: 31.7 g/dL — ABNORMAL LOW (ref 32.0–36.0)
MCV: 79.9 fL — ABNORMAL LOW (ref 80.0–100.0)
MPV: 10.9 fL (ref 7.5–12.5)
Monocytes Relative: 7.1 %
Neutro Abs: 4602 cells/uL (ref 1500–7800)
Neutrophils Relative %: 66.7 %
Platelets: 270 10*3/uL (ref 140–400)
RBC: 4.82 10*6/uL (ref 3.80–5.10)
RDW: 12.9 % (ref 11.0–15.0)
Total Lymphocyte: 23.5 %
WBC: 6.9 10*3/uL (ref 3.8–10.8)

## 2019-02-09 LAB — HEMOGLOBIN A1C
Hgb A1c MFr Bld: 6.2 % of total Hgb — ABNORMAL HIGH (ref <5.7)
Mean Plasma Glucose: 131 (calc)
eAG (mmol/L): 7.3 (calc)

## 2019-02-09 LAB — TSH: TSH: 0.08 mIU/L — ABNORMAL LOW (ref 0.40–4.50)

## 2019-02-14 ENCOUNTER — Other Ambulatory Visit: Payer: Self-pay | Admitting: *Deleted

## 2019-02-14 DIAGNOSIS — E038 Other specified hypothyroidism: Secondary | ICD-10-CM

## 2019-02-14 MED ORDER — METFORMIN HCL 500 MG PO TABS: 500.0000 mg | ORAL_TABLET | Freq: Two times a day (BID) | ORAL | 3 refills | Status: DC

## 2019-02-26 ENCOUNTER — Ambulatory Visit: Payer: BC Managed Care – PPO | Admitting: Podiatry

## 2019-02-26 ENCOUNTER — Other Ambulatory Visit: Payer: Self-pay

## 2019-02-26 ENCOUNTER — Encounter: Payer: Self-pay | Admitting: Podiatry

## 2019-02-26 VITALS — BP 179/97

## 2019-02-26 DIAGNOSIS — M79674 Pain in right toe(s): Secondary | ICD-10-CM

## 2019-02-26 DIAGNOSIS — E11319 Type 2 diabetes mellitus with unspecified diabetic retinopathy without macular edema: Secondary | ICD-10-CM | POA: Diagnosis not present

## 2019-02-26 DIAGNOSIS — B351 Tinea unguium: Secondary | ICD-10-CM | POA: Diagnosis not present

## 2019-02-26 DIAGNOSIS — E1165 Type 2 diabetes mellitus with hyperglycemia: Secondary | ICD-10-CM

## 2019-02-26 DIAGNOSIS — IMO0002 Reserved for concepts with insufficient information to code with codable children: Secondary | ICD-10-CM

## 2019-02-26 DIAGNOSIS — M79675 Pain in left toe(s): Secondary | ICD-10-CM | POA: Diagnosis not present

## 2019-02-27 ENCOUNTER — Encounter: Payer: Self-pay | Admitting: Podiatry

## 2019-02-27 NOTE — Progress Notes (Signed)
  Subjective:  [name], female    DOB: June 04, 1955,  MRN: [id number]  Chief Complaint  Patient presents with  . Nail Problem    pt is here for diabetic foot care   63 y.o. female returns for the above complaint.  She states that her toenails have gotten thickened and mycotic x10.  They have been painful in nature.  She has not been seen by previous podiatrist.  She denies any other acute complaints.  Objective:   Vitals:   02/26/19 1639  BP: (!) 179/97   Podiatric Exam: Vascular: dorsalis pedis and posterior tibial pulses are palpable bilateral. Capillary return is immediate. Temperature gradient is WNL. Skin turgor WNL  Sensorium: Normal Semmes Weinstein monofilament test. Normal tactile sensation bilaterally. Nail Exam: Pt has thick disfigured discolored nails with subungual debris noted bilateral entire nail hallux through fifth toenails Ulcer Exam: There is no evidence of ulcer or pre-ulcerative changes or infection. Orthopedic Exam: Muscle tone and strength are WNL. No limitations in general ROM. No crepitus or effusions noted. HAV  B/L.  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 Skin: No Porokeratosis. No infection or ulcers  Assessment & Plan:  Patient was evaluated and treated and all questions answered.  Onychomycosis with pain  -Nails palliatively debrided as below. -Educated on self-care  Procedure: Nail Debridement Rationale: pain  Type of Debridement: manual, sharp debridement. Instrumentation: Nail nipper, rotary burr. Number of Nails: 10  Procedures and Treatment: Consent by patient was obtained for treatment procedures. The patient understood the discussion of treatment and procedures well. All questions were answered thoroughly reviewed. Debridement of mycotic and hypertrophic toenails, 1 through 5 bilateral and clearing of subungual debris. No ulceration, no infection noted.  Return Visit-Office Procedure: Patient instructed to return to the office for a follow  up visit 3 months for continued evaluation and treatment.  Boneta Lucks, DPM    No follow-ups on file.

## 2019-04-02 ENCOUNTER — Other Ambulatory Visit: Payer: Self-pay | Admitting: Family Medicine

## 2019-04-08 DIAGNOSIS — Z20828 Contact with and (suspected) exposure to other viral communicable diseases: Secondary | ICD-10-CM | POA: Diagnosis not present

## 2019-04-08 DIAGNOSIS — R05 Cough: Secondary | ICD-10-CM | POA: Diagnosis not present

## 2019-04-19 ENCOUNTER — Other Ambulatory Visit: Payer: Self-pay | Admitting: Pulmonary Disease

## 2019-04-19 DIAGNOSIS — R0602 Shortness of breath: Secondary | ICD-10-CM

## 2019-06-08 ENCOUNTER — Other Ambulatory Visit: Payer: Self-pay | Admitting: Family Medicine

## 2019-06-25 LAB — HM DIABETES EYE EXAM

## 2019-08-01 ENCOUNTER — Other Ambulatory Visit: Payer: Self-pay | Admitting: *Deleted

## 2019-08-01 MED ORDER — LEVOTHYROXINE SODIUM 200 MCG PO TABS: ORAL_TABLET | ORAL | 3 refills | Status: DC

## 2019-08-28 ENCOUNTER — Ambulatory Visit: Payer: BC Managed Care – PPO | Admitting: Family Medicine

## 2019-08-28 NOTE — Progress Notes (Deleted)
   Subjective:    [name], female    DOB: Oct 13, 1955, 64 y.o.   MRN: [id number]  Patient presents for No chief complaint on file.  Patient here to follow-up chronic medical problems.  Medications reviewed.  She is due for fasting labs.   Hypothyroidism-currently on levothyroxine 200 mcg once a day.  This was adjusted back in September she is due for repeat thyroid function studies   HTN-taking BP medications without any difficulty.  She also has her Lasix for her chronic peripheral edema and diastolic heart failure     Takes vitamins at lunch- zinc, vitamin C, D with mid morning    Obesity-    Hyperlipidemia-her last visit we discussed taking her statin drug 3 times a week at bedtime she was having some nausea with the medication s.     DM -currently on Metformin 500 mg twice a day, last A1C 6.2% in September.  She does follow with podiatry       Review Of Systems:  GEN- denies fatigue, fever, weight loss,weakness, recent illness HEENT- denies eye drainage, change in vision, nasal discharge, CVS- denies chest pain, palpitations RESP- denies SOB, cough, wheeze ABD- denies N/V, change in stools, abd pain GU- denies dysuria, hematuria, dribbling, incontinence MSK- denies joint pain, muscle aches, injury Neuro- denies headache, dizziness, syncope, seizure activity       Objective:    There were no vitals taken for this visit. GEN- NAD, alert and oriented x3 HEENT- PERRL, EOMI, non injected sclera, pink conjunctiva, MMM, oropharynx clear Neck- Supple, no thyromegaly CVS- RRR, 3 /6 systolic murmur RESP-CTAB ABD-NABS,soft,NT,ND EXT- 1+ nonpitting edema L >R  Pulses- Radial, DP-palpated         Assessment & Plan:      Problem List Items Addressed This Visit    None      Note: This dictation was prepared with Dragon dictation along with smaller phrase technology. Any transcriptional errors that result from this process are unintentional.

## 2019-09-09 ENCOUNTER — Other Ambulatory Visit: Payer: Self-pay

## 2019-09-09 ENCOUNTER — Ambulatory Visit (INDEPENDENT_AMBULATORY_CARE_PROVIDER_SITE_OTHER): Payer: Medicare Other | Admitting: Family Medicine

## 2019-09-09 ENCOUNTER — Encounter: Payer: Self-pay | Admitting: Family Medicine

## 2019-09-09 VITALS — BP 150/84 | HR 80 | Temp 97.9°F | Resp 16 | Ht 63.0 in | Wt 263.0 lb

## 2019-09-09 DIAGNOSIS — E782 Mixed hyperlipidemia: Secondary | ICD-10-CM

## 2019-09-09 DIAGNOSIS — G4733 Obstructive sleep apnea (adult) (pediatric): Secondary | ICD-10-CM | POA: Diagnosis not present

## 2019-09-09 DIAGNOSIS — E1149 Type 2 diabetes mellitus with other diabetic neurological complication: Secondary | ICD-10-CM | POA: Diagnosis not present

## 2019-09-09 DIAGNOSIS — F321 Major depressive disorder, single episode, moderate: Secondary | ICD-10-CM

## 2019-09-09 DIAGNOSIS — R600 Localized edema: Secondary | ICD-10-CM

## 2019-09-09 DIAGNOSIS — I5189 Other ill-defined heart diseases: Secondary | ICD-10-CM

## 2019-09-09 DIAGNOSIS — E038 Other specified hypothyroidism: Secondary | ICD-10-CM | POA: Diagnosis not present

## 2019-09-09 DIAGNOSIS — Z6841 Body Mass Index (BMI) 40.0 and over, adult: Secondary | ICD-10-CM

## 2019-09-09 DIAGNOSIS — I10 Essential (primary) hypertension: Secondary | ICD-10-CM

## 2019-09-09 MED ORDER — ESCITALOPRAM OXALATE 10 MG PO TABS: 10.0000 mg | ORAL_TABLET | Freq: Every day | ORAL | 2 refills | Status: DC

## 2019-09-09 NOTE — Assessment & Plan Note (Signed)
Continue Metformin 500 mg twice a day she is not checking her blood sugars.  Goal is A1c less than 7%

## 2019-09-09 NOTE — Assessment & Plan Note (Signed)
Recheck thyroid function study she is on levothyroxine

## 2019-09-09 NOTE — Assessment & Plan Note (Signed)
Blood pressure elevated but she has not taken all her medications today including her carvedilol and her Lasix no changes to be made.  Her weight is down a pound since her last visit which is significant improvement.  She is trying to make some dietary changes reduce the significant increase water.  She is now starting to walk some.  In regards to the catching her son for musculoskeletal pain.  O2 sat is normal examination was at baseline.  There is continues or she has any respiratory symptoms I will obtain a chest x-ray

## 2019-09-09 NOTE — Patient Instructions (Addendum)
F/u 3 MONTHS for Physical We will call with lab result  Restart lexapro 10mg  at bedtime

## 2019-09-09 NOTE — Assessment & Plan Note (Signed)
Chronic peripheral edema improved some.  Continue diuretic

## 2019-09-09 NOTE — Assessment & Plan Note (Signed)
Restart Lexapro 5 mg at bedtime.

## 2019-09-09 NOTE — Assessment & Plan Note (Addendum)
Prescription written for supplies and tubing Patient has not been able to use her CPAP machine as she did not have appropriate supplies.  She would benefit from CPAP therapy in order to improve her sleep apnea and other comorbidities associated.

## 2019-09-09 NOTE — Addendum Note (Signed)
Addended by: Vic Blackbird F on: 09/09/2019 10:30 AM   Modules accepted: Orders

## 2019-09-09 NOTE — Progress Notes (Addendum)
Subjective:    [name], female    DOB: 09/10/1955, 64 y.o.   MRN: [id number]  Patient presents for Follow-up (is fasting) Patient here to follow-up chronic medical problems.  Medications reviewed.  Her last visit was in September 2020.  She was followed by pulmonary has not seen them in greater than 1 year.  At the end of the visit she states that she does get catching her left side when she tosses and turns in different directions been going on for few weeks.  No shortness of breath associated.  No chest pain.  Hypothyroidism she is taking levothyroxine 200 mcg once a day this was changed back in September she was present follow-up labs in 6 to 8 weeks but did not return to have his  Hypertension current medications include Lasix also helps with her peripheral edema/diastolic dysfunction.  She is also taking carvedilol 25 mg twice a day, losartan 100 mg once a day, amlodipine 10 mg once a day  Hyperlipidemia currently on Lipitor 20 mg at bedtime last LDL 101 in September total cholesterol 166  Diabetes mellitus taking Metformin 500 mg twice a day last A1c 6.2%, she is followed by podiatry  Depression with anxiety he was previously on Lexapro she has not been taking the lexapro feels that it may be beneficial she is back on it.  She has significant stressors that are ongoing.   Brother has esophageal cancer with some mets     Daughter recently in rehab  She is also helping to take care of the grandchildren  COVID-23 vaccination done- Moderna  Class 3 obesity- weight down  12lbs since Sept, she states she admitted she gained some weight during COVID, but then started losing again she has been walking some    This AM has only taking losartan, and norvasc    NEeds script for tubing and supplies [place], she has not been able to use her CPAP machine recently due to lack of supplies    Review Of Systems:  GEN- denies fatigue, fever, weight  loss,weakness, recent illness HEENT- denies eye drainage, change in vision, nasal discharge, CVS- denies chest pain, palpitations RESP- denies SOB, cough, wheeze ABD- denies N/V, change in stools, abd pain GU- denies dysuria, hematuria, dribbling, incontinence MSK- denies joint pain, +muscle aches, injury Neuro- denies headache, dizziness, syncope, seizure activity       Objective:    BP (!) 150/84 (BP Location: Right Arm, Patient Position: Sitting, Cuff Size: Large)   Pulse 80   Temp 97.9 F (36.6 C) (Temporal)   Resp 16   Ht 5\' 3"  (1.6 m)   Wt 263 lb (119.3 kg)   SpO2 94%   BMI 46.59 kg/m  GEN- NAD, alert and oriented x3 HEENT- PERRL, EOMI, non injected sclera, pink conjunctiva, MMM, oropharynx clear Neck- Supple, no thyromegaly CVS- RRR, 3 /6 systolic murmur RESP-CTAB ABD-NABS,soft,NT,ND Musculoskeletal spine nontender finger range of motion, paraspinals nontender EXT- 1+ nonpitting edema L >R  Psych- normal affect and mood  Pulses- Radial, DP-palpated     Assessment & Plan:      Problem List Items Addressed This Visit      Unprioritized   Depression    Restart Lexapro 5 mg at bedtime.      Relevant Medications   escitalopram (LEXAPRO) 10 MG tablet   Diabetes mellitus, type II (HCC)    Continue Metformin 500 mg twice a day she is not checking her blood sugars.  Goal  is A1c less than 7%      Relevant Orders   Hemoglobin A1c (Completed)   Lipid panel (Completed)   Microalbumin / creatinine urine ratio (Completed)   HM DIABETES FOOT EXAM (Completed)   Diastolic dysfunction    Chronic peripheral edema improved some.  Continue diuretic      Essential hypertension - Primary    Blood pressure elevated but she has not taken all her medications today including her carvedilol and her Lasix no changes to be made.  Her weight is down a pound since her last visit which is significant improvement.  She is trying to make some dietary changes reduce the significant  increase water.  She is now starting to walk some.  In regards to the catching her son for musculoskeletal pain.  O2 sat is normal examination was at baseline.  There is continues or she has any respiratory symptoms I will obtain a chest x-ray      Relevant Orders   CBC with Differential/Platelet (Completed)   Comprehensive metabolic panel (Completed)   Hyperlipidemia   Relevant Orders   Lipid panel (Completed)   Hypothyroidism    Recheck thyroid function study she is on levothyroxine      Relevant Orders   TSH (Completed)   T3, free (Completed)   Leg edema   Obesity   OSA (obstructive sleep apnea)    Prescription written for supplies and tubing Patient has not been able to use her CPAP machine as she did not have appropriate supplies.  She would benefit from CPAP therapy in order to improve her sleep apnea and other comorbidities associated.         Note: This dictation was prepared with Dragon dictation along with smaller phrase technology. Any transcriptional errors that result from this process are unintentional.

## 2019-09-10 LAB — COMPREHENSIVE METABOLIC PANEL
AG Ratio: 1.1 (calc) (ref 1.0–2.5)
ALT: 12 U/L (ref 6–29)
AST: 14 U/L (ref 10–35)
Albumin: 3.7 g/dL (ref 3.6–5.1)
Alkaline phosphatase (APISO): 78 U/L (ref 37–153)
BUN: 22 mg/dL (ref 7–25)
CO2: 34 mmol/L — ABNORMAL HIGH (ref 20–32)
Calcium: 9.4 mg/dL (ref 8.6–10.4)
Chloride: 100 mmol/L (ref 98–110)
Creat: 0.77 mg/dL (ref 0.50–0.99)
Globulin: 3.5 g/dL (calc) (ref 1.9–3.7)
Glucose, Bld: 115 mg/dL — ABNORMAL HIGH (ref 65–99)
Potassium: 3.8 mmol/L (ref 3.5–5.3)
Sodium: 143 mmol/L (ref 135–146)
Total Bilirubin: 0.3 mg/dL (ref 0.2–1.2)
Total Protein: 7.2 g/dL (ref 6.1–8.1)

## 2019-09-10 LAB — CBC WITH DIFFERENTIAL/PLATELET
Absolute Monocytes: 459 cells/uL (ref 200–950)
Basophils Absolute: 33 cells/uL (ref 0–200)
Basophils Relative: 0.4 %
Eosinophils Absolute: 353 cells/uL (ref 15–500)
Eosinophils Relative: 4.3 %
HCT: 41.4 % (ref 35.0–45.0)
Hemoglobin: 13.1 g/dL (ref 11.7–15.5)
Lymphs Abs: 2165 cells/uL (ref 850–3900)
MCH: 26.1 pg — ABNORMAL LOW (ref 27.0–33.0)
MCHC: 31.6 g/dL — ABNORMAL LOW (ref 32.0–36.0)
MCV: 82.6 fL (ref 80.0–100.0)
MPV: 10.4 fL (ref 7.5–12.5)
Monocytes Relative: 5.6 %
Neutro Abs: 5191 cells/uL (ref 1500–7800)
Neutrophils Relative %: 63.3 %
Platelets: 259 10*3/uL (ref 140–400)
RBC: 5.01 10*6/uL (ref 3.80–5.10)
RDW: 13 % (ref 11.0–15.0)
Total Lymphocyte: 26.4 %
WBC: 8.2 10*3/uL (ref 3.8–10.8)

## 2019-09-10 LAB — HEMOGLOBIN A1C
Hgb A1c MFr Bld: 6.2 % of total Hgb — ABNORMAL HIGH (ref <5.7)
Mean Plasma Glucose: 131 (calc)
eAG (mmol/L): 7.3 (calc)

## 2019-09-10 LAB — LIPID PANEL
Cholesterol: 185 mg/dL (ref <200)
HDL: 59 mg/dL (ref >=50)
LDL Cholesterol (Calc): 111 mg/dL (calc) — ABNORMAL HIGH
Non-HDL Cholesterol (Calc): 126 mg/dL (calc) (ref <130)
Total CHOL/HDL Ratio: 3.1 (calc) (ref <5.0)
Triglycerides: 67 mg/dL (ref <150)

## 2019-09-10 LAB — MICROALBUMIN / CREATININE URINE RATIO
Creatinine, Urine: 47 mg/dL (ref 20–275)
Microalb Creat Ratio: 113 mcg/mg creat — ABNORMAL HIGH (ref <30)
Microalb, Ur: 5.3 mg/dL

## 2019-09-10 LAB — TSH: TSH: 4.84 mIU/L — ABNORMAL HIGH (ref 0.40–4.50)

## 2019-09-10 LAB — T3, FREE: T3, Free: 2.3 pg/mL (ref 2.3–4.2)

## 2019-09-11 ENCOUNTER — Other Ambulatory Visit: Payer: Self-pay | Admitting: *Deleted

## 2019-09-11 DIAGNOSIS — E038 Other specified hypothyroidism: Secondary | ICD-10-CM

## 2019-09-11 MED ORDER — ATORVASTATIN CALCIUM 40 MG PO TABS: 40.0000 mg | ORAL_TABLET | Freq: Every day | ORAL | 1 refills | Status: DC

## 2019-09-12 ENCOUNTER — Encounter: Payer: Self-pay | Admitting: *Deleted

## 2019-09-13 ENCOUNTER — Other Ambulatory Visit: Payer: Self-pay | Admitting: Family Medicine

## 2019-09-13 ENCOUNTER — Telehealth: Payer: Self-pay | Admitting: Family Medicine

## 2019-09-13 NOTE — Telephone Encounter (Signed)
I wrote addendum Okay to fax them the new note

## 2019-09-13 NOTE — Telephone Encounter (Signed)
Received a voicemail from [place] stating that patient now has a Patent attorney and in order for medicare to pay for new tubing and supplies they need an addendum written on the note from 09/09/2019 stating that patient has not been able to use CPAP machine due to needing new supplies. Mariann Laster with [employer] stated that medicare will not accept the previous wording.

## 2019-09-13 NOTE — Telephone Encounter (Signed)
Updated notes faxed over to [facility]

## 2019-09-20 DIAGNOSIS — G4733 Obstructive sleep apnea (adult) (pediatric): Secondary | ICD-10-CM | POA: Diagnosis not present

## 2019-10-22 ENCOUNTER — Other Ambulatory Visit: Payer: Self-pay

## 2019-11-11 ENCOUNTER — Encounter: Payer: Self-pay | Admitting: Family Medicine

## 2019-11-11 ENCOUNTER — Ambulatory Visit (INDEPENDENT_AMBULATORY_CARE_PROVIDER_SITE_OTHER): Payer: Medicare Other | Admitting: Family Medicine

## 2019-11-11 ENCOUNTER — Other Ambulatory Visit: Payer: Self-pay

## 2019-11-11 VITALS — BP 148/90 | HR 88 | Temp 98.4°F | Resp 16 | Ht 63.0 in | Wt 259.0 lb

## 2019-11-11 DIAGNOSIS — H60502 Unspecified acute noninfective otitis externa, left ear: Secondary | ICD-10-CM

## 2019-11-11 DIAGNOSIS — W57XXXA Bitten or stung by nonvenomous insect and other nonvenomous arthropods, initial encounter: Secondary | ICD-10-CM | POA: Diagnosis not present

## 2019-11-11 DIAGNOSIS — I10 Essential (primary) hypertension: Secondary | ICD-10-CM | POA: Diagnosis not present

## 2019-11-11 DIAGNOSIS — F4321 Adjustment disorder with depressed mood: Secondary | ICD-10-CM

## 2019-11-11 MED ORDER — CLONIDINE HCL 0.1 MG PO TABS
0.2000 mg | ORAL_TABLET | Freq: Once | ORAL | Status: AC
  Administered 2019-11-11: 0.2 mg via ORAL

## 2019-11-11 MED ORDER — PREDNISONE 20 MG PO TABS: 20.0000 mg | ORAL_TABLET | Freq: Every day | ORAL | 0 refills | Status: DC

## 2019-11-11 MED ORDER — DOXYCYCLINE HYCLATE 100 MG PO TABS: 100.0000 mg | ORAL_TABLET | Freq: Two times a day (BID) | ORAL | 0 refills | Status: DC

## 2019-11-11 MED ORDER — CLONIDINE HCL 0.1 MG PO TABS: 0.1000 mg | ORAL_TABLET | Freq: Every day | ORAL | 3 refills | Status: DC

## 2019-11-11 NOTE — Progress Notes (Signed)
10am: BP 176/92 10:30am: Clonidine 0.2mg  PO given 10:45am: BP 162/90 11:10am: BP 148/90

## 2019-11-11 NOTE — Patient Instructions (Addendum)
I recommend you take the lexapro to help with mood  Stop amoxicillin, take doxycycline 100mg  twice a day for tick bite Take prednisone for inflammation  Add clonidine 0.1mg  at bedtime for your blood pressure  ICE your ear/face Send me blood pressure readings this Wed F/U 1 month

## 2019-11-11 NOTE — Progress Notes (Signed)
Subjective:    [name], female    DOB: [birth year], 64 y.o.   MRN: [id number]  Patient presents for Tick Bite (Friday night- back of ear- some jaw swelling and ear lobe swelling- took benadryl and Amox from dentisit)   Pt seen at dentist this AM, she was cheduled routine visit, but BP was high 225/100 they sent her to UC but they had a long wait.  she has not been checking her BP recently  She states her brother died last week, from cancer, and a cousin, things have been very challenging she was driving him to doctors appt at cancer center daily, then hospice was called in    Friday had small tick removed off ear. 24 hours she woke up with itching and swelling of ear and jaw  she used topical benadryl first and then oral benadrl   No fever or chills      Review Of Systems:  GEN- denies fatigue, fever, weight loss,weakness, recent illness HEENT- denies eye drainage, change in vision, nasal discharge, CVS- denies chest pain, palpitations RESP- denies SOB, cough, wheeze ABD- denies N/V, change in stools, abd pain GU- denies dysuria, hematuria, dribbling, incontinence MSK- denies joint pain, muscle aches, injury Neuro- denies headache, dizziness, syncope, seizure activity       Objective:    BP (!) 148/90 (BP Location: Left Arm, Patient Position: Sitting, Cuff Size: Large)   Pulse 88   Temp 98.4 F (36.9 C) (Temporal)   Resp 16   Ht 5\' 3"  (1.6 m)   Wt 259 lb (117.5 kg)   SpO2 97%   BMI 45.88 kg/m  GEN- NAD, alert and oriented x3 HEENT- PERRL, EOMI, non injected sclera, pink conjunctiva, MMM, oropharynx clear , canals clear, TM in tact, swelling of left pinna/lobe- TTP, mild erythema, scab present  Neck- Supple, left auiricular LAD CVS- RRR, 3 /6 systolic murmur RESP-CTABr EXT- 1+ nonpitting edema L >R  Psych- depressed affect, no SI, well groomed, good eye contact Pulses- Radial, DP-palpated       Assessment & Plan:      Problem List Items  Addressed This Visit      Unprioritized   Essential hypertension    Uncontrolled blood pressure.  Worsened and the fact that she is going through grief right now and also has an acute infection.  She states that she is taking all her blood pressure medicines.  She was given clonidine 0.2 mg in the office and monitored for 30 minutes her blood pressure did come down.  I will add 0.1 mg of clonidine to her current medication regimen.  She will call us in 2 days with blood pressure readings.      Relevant Medications   cloNIDine (CATAPRES) 0.1 MG tablet    Other Visit Diagnoses    Tick bite, initial encounter    -  Primary   Tick bite with subsequent infection the setting of her other comorbidities and diabetes.  We will change her antibiotic to doxycycline.  Will give prednisone for the swelling and reaction to the tick bite.  Her blood sugars are well controlled.   Acute otitis externa of left ear, unspecified type       Grief reaction       Highly recommend she take her Lexapro consistently.  She does have good support system      Note: This dictation was prepared with Dragon dictation along with smaller phrase technology. Any transcriptional errors that result  from this process are unintentional.

## 2019-11-12 ENCOUNTER — Encounter: Payer: Self-pay | Admitting: Family Medicine

## 2019-11-12 NOTE — Assessment & Plan Note (Signed)
Uncontrolled blood pressure.  Worsened and the fact that she is going through grief right now and also has an acute infection.  She states that she is taking all her blood pressure medicines.  She was given clonidine 0.2 mg in the office and monitor for 30 minutes her blood pressure did come down.  I will add 0.1 mg of clonidine to her current medication regimen.  She will call us in 2 days with blood pressure readings.

## 2019-11-18 ENCOUNTER — Other Ambulatory Visit: Payer: Self-pay | Admitting: *Deleted

## 2019-11-18 MED ORDER — LOSARTAN POTASSIUM 100 MG PO TABS: 100.0000 mg | ORAL_TABLET | Freq: Every day | ORAL | 3 refills | Status: DC

## 2019-11-20 ENCOUNTER — Encounter: Payer: Self-pay | Admitting: Family Medicine

## 2019-11-20 NOTE — Progress Notes (Signed)
Letter written for shoes

## 2019-12-11 ENCOUNTER — Other Ambulatory Visit (HOSPITAL_COMMUNITY): Payer: Self-pay | Admitting: Family Medicine

## 2019-12-11 DIAGNOSIS — Z1231 Encounter for screening mammogram for malignant neoplasm of breast: Secondary | ICD-10-CM

## 2019-12-12 ENCOUNTER — Other Ambulatory Visit: Payer: Self-pay

## 2019-12-12 ENCOUNTER — Ambulatory Visit (HOSPITAL_COMMUNITY)
Admission: RE | Admit: 2019-12-12 | Discharge: 2019-12-12 | Disposition: A | Discharge status: Home / Self Care | Payer: Medicare Other | Source: Ambulatory Visit | Attending: Family Medicine | Admitting: Family Medicine

## 2019-12-12 DIAGNOSIS — Z1231 Encounter for screening mammogram for malignant neoplasm of breast: Secondary | ICD-10-CM | POA: Diagnosis not present

## 2019-12-16 ENCOUNTER — Other Ambulatory Visit (HOSPITAL_COMMUNITY): Payer: Self-pay | Admitting: Family Medicine

## 2019-12-16 DIAGNOSIS — R928 Other abnormal and inconclusive findings on diagnostic imaging of breast: Secondary | ICD-10-CM

## 2019-12-18 ENCOUNTER — Other Ambulatory Visit: Payer: Self-pay

## 2019-12-18 ENCOUNTER — Encounter: Payer: Self-pay | Admitting: Family Medicine

## 2019-12-18 ENCOUNTER — Ambulatory Visit (INDEPENDENT_AMBULATORY_CARE_PROVIDER_SITE_OTHER): Payer: Medicare Other | Admitting: Family Medicine

## 2019-12-18 VITALS — BP 142/96 | HR 74 | Temp 98.6°F | Resp 14 | Ht 63.0 in | Wt 260.0 lb

## 2019-12-18 DIAGNOSIS — G4733 Obstructive sleep apnea (adult) (pediatric): Secondary | ICD-10-CM

## 2019-12-18 DIAGNOSIS — E1149 Type 2 diabetes mellitus with other diabetic neurological complication: Secondary | ICD-10-CM | POA: Diagnosis not present

## 2019-12-18 DIAGNOSIS — F321 Major depressive disorder, single episode, moderate: Secondary | ICD-10-CM

## 2019-12-18 DIAGNOSIS — E038 Other specified hypothyroidism: Secondary | ICD-10-CM | POA: Diagnosis not present

## 2019-12-18 DIAGNOSIS — I5189 Other ill-defined heart diseases: Secondary | ICD-10-CM

## 2019-12-18 DIAGNOSIS — Z6841 Body Mass Index (BMI) 40.0 and over, adult: Secondary | ICD-10-CM

## 2019-12-18 DIAGNOSIS — I10 Essential (primary) hypertension: Secondary | ICD-10-CM | POA: Diagnosis not present

## 2019-12-18 NOTE — Patient Instructions (Addendum)
[facility]  [phone] We will call with lab results  F/U 4 months- ( 30 minutes)

## 2019-12-18 NOTE — Assessment & Plan Note (Signed)
Again discussed medication compliance and she would benefit from therapy Given resources today for therapist

## 2019-12-18 NOTE — Assessment & Plan Note (Signed)
Discussed need for compliance At visit in April new scripts given for her supplies etc

## 2019-12-18 NOTE — Assessment & Plan Note (Signed)
BP back to baseline  Continue current meds Keep clonidine Dietary changes, compliance with CPAP Therapy would also help control

## 2019-12-18 NOTE — Assessment & Plan Note (Signed)
Recheck A1C Continue MTF

## 2019-12-18 NOTE — Assessment & Plan Note (Signed)
Continue diuiretic

## 2019-12-18 NOTE — Progress Notes (Signed)
   Subjective:    [name], female    DOB: 1955-12-26, 64 y.o.   MRN: [id number]  Patient presents for Follow-up (is fasting) and Depression (feeling detached from issues)  Pt here for f/u chronic medical problems  had visit last month due to elevated bp at dentist  clonidine was added to regimen. She has not checked bp at home, but feels okay. Admits to lots of stressors per our previous visit, not taking care of herself due to declining health of brother who passed. She has not been using CPAP  She has not been taking lexapro consistently She has taken meds today except diuretic  Due for repeat labs     Review Of Systems:  GEN- + fatigue, denies  fever, weight loss,weakness, recent illness HEENT- denies eye drainage, change in vision, nasal discharge, CVS- denies chest pain, palpitations RESP- denies SOB, cough, wheeze ABD- denies N/V, change in stools, abd pain GU- denies dysuria, hematuria, dribbling, incontinence MSK- denies joint pain, muscle aches, injury Neuro- denies headache, dizziness, syncope, seizure activity       Objective:    BP (!) 142/96 (BP Location: Left Arm, Patient Position: Sitting, Cuff Size: Large)   Pulse 74   Temp 98.6 F (37 C) (Temporal)   Resp 14   Ht 5\' 3"  (1.6 m)   Wt (!) 260 lb (117.9 kg)   SpO2 97%   BMI 46.06 kg/m  GEN- NAD, alert and oriented x3 HEENT- PERRL, EOMI, non injected sclera, pink conjunctiva, MMM, oropharynx clear  CVS- RRR, 3 /6 systolic murmur RESP-CTAB EXT- 1+ nonpitting edema L >R  Psych- normal affect and mood, no SI Pulses- Radial, DP-palpated        Assessment & Plan:      Problem List Items Addressed This Visit      Unprioritized   Depression    Again discussed medication compliance and she would benefit from therapy Given resources today for therapist       Diabetes mellitus, type II ([place])    Recheck A1C Continue MTF      Relevant Orders   Hemoglobin A1c   Lipid panel    Diastolic dysfunction    Continue diuiretic       Relevant Orders   Comprehensive metabolic panel   TSH   Essential hypertension - Primary    BP back to baseline  Continue current meds Keep clonidine Dietary changes, compliance with CPAP Therapy would also help control      Relevant Orders   CBC with Differential/Platelet   Comprehensive metabolic panel   Hypothyroidism   Relevant Orders   TSH   T3, free   Obesity   Relevant Orders   Lipid panel   OSA (obstructive sleep apnea)    Discussed need for compliance At visit in April new scripts given for her supplies etc         Note: This dictation was prepared with Diplomatic Services operational officer dictation along with smaller phrase technology. Any transcriptional errors that result from this process are unintentional.

## 2019-12-19 LAB — HEMOGLOBIN A1C
Hgb A1c MFr Bld: 6 % of total Hgb — ABNORMAL HIGH (ref <5.7)
Mean Plasma Glucose: 126 (calc)
eAG (mmol/L): 7 (calc)

## 2019-12-19 LAB — COMPREHENSIVE METABOLIC PANEL
AG Ratio: 1.2 (calc) (ref 1.0–2.5)
ALT: 8 U/L (ref 6–29)
AST: 13 U/L (ref 10–35)
Albumin: 3.7 g/dL (ref 3.6–5.1)
Alkaline phosphatase (APISO): 78 U/L (ref 37–153)
BUN: 21 mg/dL (ref 7–25)
CO2: 33 mmol/L — ABNORMAL HIGH (ref 20–32)
Calcium: 9 mg/dL (ref 8.6–10.4)
Chloride: 102 mmol/L (ref 98–110)
Creat: 0.88 mg/dL (ref 0.50–0.99)
Globulin: 3.1 g/dL (calc) (ref 1.9–3.7)
Glucose, Bld: 104 mg/dL — ABNORMAL HIGH (ref 65–99)
Potassium: 4 mmol/L (ref 3.5–5.3)
Sodium: 141 mmol/L (ref 135–146)
Total Bilirubin: 0.5 mg/dL (ref 0.2–1.2)
Total Protein: 6.8 g/dL (ref 6.1–8.1)

## 2019-12-19 LAB — CBC WITH DIFFERENTIAL/PLATELET
Absolute Monocytes: 451 cells/uL (ref 200–950)
Basophils Absolute: 37 cells/uL (ref 0–200)
Basophils Relative: 0.5 %
Eosinophils Absolute: 252 cells/uL (ref 15–500)
Eosinophils Relative: 3.4 %
HCT: 40 % (ref 35.0–45.0)
Hemoglobin: 12.3 g/dL (ref 11.7–15.5)
Lymphs Abs: 2190 cells/uL (ref 850–3900)
MCH: 25.3 pg — ABNORMAL LOW (ref 27.0–33.0)
MCHC: 30.8 g/dL — ABNORMAL LOW (ref 32.0–36.0)
MCV: 82.1 fL (ref 80.0–100.0)
MPV: 10.6 fL (ref 7.5–12.5)
Monocytes Relative: 6.1 %
Neutro Abs: 4470 cells/uL (ref 1500–7800)
Neutrophils Relative %: 60.4 %
Platelets: 273 10*3/uL (ref 140–400)
RBC: 4.87 10*6/uL (ref 3.80–5.10)
RDW: 13.2 % (ref 11.0–15.0)
Total Lymphocyte: 29.6 %
WBC: 7.4 10*3/uL (ref 3.8–10.8)

## 2019-12-19 LAB — LIPID PANEL
Cholesterol: 175 mg/dL (ref <200)
HDL: 57 mg/dL (ref >=50)
LDL Cholesterol (Calc): 102 mg/dL (calc) — ABNORMAL HIGH
Non-HDL Cholesterol (Calc): 118 mg/dL (calc) (ref <130)
Total CHOL/HDL Ratio: 3.1 (calc) (ref <5.0)
Triglycerides: 69 mg/dL (ref <150)

## 2019-12-19 LAB — TSH: TSH: 1.07 mIU/L (ref 0.40–4.50)

## 2019-12-19 LAB — T3, FREE: T3, Free: 2.5 pg/mL (ref 2.3–4.2)

## 2019-12-24 ENCOUNTER — Ambulatory Visit (HOSPITAL_COMMUNITY)
Admission: RE | Admit: 2019-12-24 | Discharge: 2019-12-24 | Disposition: A | Discharge status: Home / Self Care | Payer: Medicare Other | Source: Ambulatory Visit | Attending: Family Medicine | Admitting: Family Medicine

## 2019-12-24 ENCOUNTER — Other Ambulatory Visit: Payer: Self-pay

## 2019-12-24 DIAGNOSIS — R928 Other abnormal and inconclusive findings on diagnostic imaging of breast: Secondary | ICD-10-CM | POA: Diagnosis not present

## 2019-12-26 ENCOUNTER — Other Ambulatory Visit: Payer: Self-pay | Admitting: Family Medicine

## 2019-12-26 ENCOUNTER — Other Ambulatory Visit: Payer: Self-pay | Admitting: *Deleted

## 2019-12-26 DIAGNOSIS — R921 Mammographic calcification found on diagnostic imaging of breast: Secondary | ICD-10-CM

## 2020-01-06 ENCOUNTER — Ambulatory Visit
Admission: RE | Admit: 2020-01-06 | Discharge: 2020-01-06 | Disposition: A | Discharge status: Home / Self Care | Payer: Medicare Other | Source: Ambulatory Visit | Attending: Family Medicine | Admitting: Family Medicine

## 2020-01-06 ENCOUNTER — Other Ambulatory Visit: Payer: Self-pay

## 2020-01-06 DIAGNOSIS — R921 Mammographic calcification found on diagnostic imaging of breast: Secondary | ICD-10-CM

## 2020-01-06 DIAGNOSIS — D0512 Intraductal carcinoma in situ of left breast: Secondary | ICD-10-CM | POA: Diagnosis not present

## 2020-01-06 HISTORY — PX: BREAST BIOPSY: SHX20

## 2020-01-14 ENCOUNTER — Telehealth: Payer: Self-pay | Admitting: *Deleted

## 2020-01-14 ENCOUNTER — Other Ambulatory Visit: Payer: Self-pay | Admitting: Family Medicine

## 2020-01-14 NOTE — Telephone Encounter (Signed)
noted 

## 2020-01-14 NOTE — Telephone Encounter (Signed)
Received call from patient.   Reports that she has consult with Dr. Nedra Hai at [facility] to discuss tx for L breast cancer.   Patient noted to have high anxiety and would like to have provider discuss options with her after consult.   Appointment scheduled.

## 2020-01-17 ENCOUNTER — Other Ambulatory Visit: Payer: Self-pay

## 2020-01-17 ENCOUNTER — Other Ambulatory Visit: Payer: Self-pay | Admitting: Surgery

## 2020-01-17 ENCOUNTER — Encounter: Payer: Self-pay | Admitting: Family Medicine

## 2020-01-17 ENCOUNTER — Ambulatory Visit (INDEPENDENT_AMBULATORY_CARE_PROVIDER_SITE_OTHER): Payer: Medicare Other | Admitting: Family Medicine

## 2020-01-17 VITALS — BP 126/70 | HR 72 | Temp 98.7°F | Resp 14 | Ht 63.0 in | Wt 259.0 lb

## 2020-01-17 DIAGNOSIS — F418 Other specified anxiety disorders: Secondary | ICD-10-CM

## 2020-01-17 DIAGNOSIS — D0512 Intraductal carcinoma in situ of left breast: Secondary | ICD-10-CM

## 2020-01-17 DIAGNOSIS — M5431 Sciatica, right side: Secondary | ICD-10-CM

## 2020-01-17 DIAGNOSIS — F321 Major depressive disorder, single episode, moderate: Secondary | ICD-10-CM

## 2020-01-17 MED ORDER — TIZANIDINE HCL 4 MG PO TABS
4.0000 mg | ORAL_TABLET | Freq: Three times a day (TID) | ORAL | 0 refills | Status: DC | PRN
Start: 2020-01-17 — End: 2020-01-24

## 2020-01-17 MED ORDER — NAPROXEN 500 MG PO TABS
500.0000 mg | ORAL_TABLET | Freq: Two times a day (BID) | ORAL | 0 refills | Status: DC
Start: 2020-01-17 — End: 2020-02-13

## 2020-01-17 NOTE — Patient Instructions (Signed)
F/U as previous 

## 2020-01-17 NOTE — Assessment & Plan Note (Signed)
Discussed typical approach with surgical intervention, radiation and sometime tamoxifen She feels comfortable with the plan layed out by her treatment team and surgeon

## 2020-01-17 NOTE — Progress Notes (Signed)
   Subjective:    [name], female    DOB: July 07, 1955, 64 y.o.   MRN: [id number]  Patient presents for F/U Consult (seen today by Dr. Ninfa Linden) and Back Pain (x4 days- sciatic pain R side) Patient here to discuss recent diagnosis of breast cancer.  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  She has already been evaluated by Dr. Ninfa Linden at [facility] She wanted to review info that was advised to her about treatment  Her anxiety has been significantly elevated since she found out about the diagnosis.  See previous notes -  I have  been trying to treat her depression and anxiety with medication however she has not been consistent with the medications. She is  prescribed Lexapro 10 mg  For the past 4 days she has had pain in her right buttocks that shoots down to her knee.  It will correct she is standing too long or moving around.  Denies any tingling or numbness in her feet.  She does not have any true low back pain that starts at the buttocks region.  No particular injury she has taking some Tylenol and ibuprofen which is helped some  Review Of Systems:  GEN- denies fatigue, fever, weight loss,weakness, recent illness HEENT- denies eye drainage, change in vision, nasal discharge, CVS- denies chest pain, palpitations RESP- denies SOB, cough, wheeze ABD- denies N/V, change in stools, abd pain GU- denies dysuria, hematuria, dribbling, incontinence MSK- + joint pain, muscle aches, injury Neuro- denies headache, dizziness, syncope, seizure activity       Objective:    BP 126/70   Pulse 72   Temp 98.7 F (37.1 C) (Temporal)   Resp 14   Ht 5\' 3"  (1.6 m)   Wt 259 lb (117.5 kg)   SpO2 97%   BMI 45.88 kg/m  GEN- NAD, alert and oriented x3 CVS- RRR, 3 /6 systolic murmur RESP-CTAB EXT- 1+ nonpitting edema L >R  MSK- Spine NT, fair ROM Spine, HIPS/knees, mild TTP over right buttocks, thigh, equivocal  SLR nEURO- strength in tact LE, sensation grossly in tact, antalgic gait  Psych- depressed affect, not anxious, no SI        Assessment & Plan:      Problem List Items Addressed This Visit      Unprioritized   Depression - Primary    unfortunately recently diagnosed with cancer which has worsened her stressors with depression and anxiety symptoms She plans to start lexapro at bedtime She has support from family        Ductal carcinoma in situ (DCIS) of left breast    Discussed typical approach with surgical intervention, radiation and sometime tamoxifen She feels comfortable with the plan layed out by her treatment team and surgeon       Other Visit Diagnoses    Situational anxiety       Back pain with right-sided sciatica       No red flags, she declined steroids today, will try naprosyn and muscle relaxer, if not improved will obtain xray of lumbar spine in setting of newly diagnosed cancer    Relevant Medications   naproxen (NAPROSYN) 500 MG tablet   tiZANidine (ZANAFLEX) 4 MG tablet      Note: This dictation was prepared with Dragon dictation along with smaller phrase technology. Any transcriptional errors that result from this process are unintentional.

## 2020-01-17 NOTE — Assessment & Plan Note (Signed)
unfortunately recently diagnosed with cancer which has worsened her stressors with depression and anxiety symptoms She plans to start lexapro at bedtime She has support from family

## 2020-01-20 ENCOUNTER — Telehealth: Payer: Self-pay | Admitting: *Deleted

## 2020-01-20 ENCOUNTER — Other Ambulatory Visit: Payer: Self-pay

## 2020-01-20 ENCOUNTER — Ambulatory Visit (HOSPITAL_COMMUNITY)
Admission: RE | Admit: 2020-01-20 | Discharge: 2020-01-20 | Disposition: A | Discharge status: Home / Self Care | Payer: Medicare Other | Source: Ambulatory Visit | Attending: Family Medicine | Admitting: Family Medicine

## 2020-01-20 ENCOUNTER — Other Ambulatory Visit: Payer: Self-pay | Admitting: *Deleted

## 2020-01-20 DIAGNOSIS — M5136 Other intervertebral disc degeneration, lumbar region: Secondary | ICD-10-CM | POA: Diagnosis not present

## 2020-01-20 DIAGNOSIS — M5440 Lumbago with sciatica, unspecified side: Secondary | ICD-10-CM

## 2020-01-20 DIAGNOSIS — M5137 Other intervertebral disc degeneration, lumbosacral region: Secondary | ICD-10-CM | POA: Diagnosis not present

## 2020-01-20 NOTE — Telephone Encounter (Signed)
Order Xray lumbar spine at [facility], I think that is closer for her

## 2020-01-20 NOTE — Telephone Encounter (Signed)
Received call from patient.   Reports that she continues taking the dose pack for her sciatica, but she has had no relief.   Inquired as to next steps.   MD please advise.

## 2020-01-20 NOTE — Telephone Encounter (Signed)
Call placed to patient and patient made aware.   Orders placed for X-ray.

## 2020-01-21 ENCOUNTER — Other Ambulatory Visit: Payer: Self-pay | Admitting: *Deleted

## 2020-01-21 MED ORDER — TRAMADOL HCL 50 MG PO TABS: 50.0000 mg | ORAL_TABLET | Freq: Two times a day (BID) | ORAL | 0 refills | Status: DC | PRN

## 2020-01-22 ENCOUNTER — Other Ambulatory Visit: Payer: Self-pay | Admitting: Surgery

## 2020-01-22 DIAGNOSIS — D0512 Intraductal carcinoma in situ of left breast: Secondary | ICD-10-CM

## 2020-01-23 ENCOUNTER — Telehealth: Payer: Self-pay | Admitting: Oncology

## 2020-01-23 NOTE — Telephone Encounter (Signed)
Received a new pt referral from Dr. Ninfa Linden for new dx of breast cancer. Frances Hughes has been cld and scheduled to see Frances Hughes on 10/11 at 4pm w/labs at 3:30pm. Pt aware to arrive 15 minutes early.

## 2020-01-24 ENCOUNTER — Telehealth: Payer: Self-pay | Admitting: *Deleted

## 2020-01-24 ENCOUNTER — Other Ambulatory Visit: Payer: Self-pay | Admitting: Family Medicine

## 2020-01-24 DIAGNOSIS — M5431 Sciatica, right side: Secondary | ICD-10-CM

## 2020-01-24 DIAGNOSIS — M5440 Lumbago with sciatica, unspecified side: Secondary | ICD-10-CM

## 2020-01-24 MED ORDER — METHYLPREDNISOLONE 4 MG PO TBPK: ORAL_TABLET | ORAL | 0 refills | Status: DC

## 2020-01-24 NOTE — Telephone Encounter (Signed)
Please see referral for further information.

## 2020-01-24 NOTE — Telephone Encounter (Signed)
Call placed to patient and patient made aware.   Prescription sent to pharmacy.  

## 2020-01-24 NOTE — Telephone Encounter (Signed)
Patient unable to be seen until next week.   Inquired as to if she can start the prednisone at this time. Did reports that she would like to know if the steroid would interact with her cancer or upcoming treatment.   MD please advise.

## 2020-01-24 NOTE — Telephone Encounter (Signed)
°  Place urgent referral to orthopedics  Dx DDD lumbar spine  Right hip pain  No additional meds

## 2020-01-24 NOTE — Telephone Encounter (Signed)
Ok to refill 

## 2020-01-24 NOTE — Telephone Encounter (Signed)
Okay to send Medrol dosepak

## 2020-01-24 NOTE — Telephone Encounter (Signed)
Referral orders placed

## 2020-01-24 NOTE — Telephone Encounter (Signed)
Received call from patient [phone]~ telephone.  Reports that she continues to have severe pain in R hip and was not able to get any relief last night to rest. States that she has tried ice, heat, topical gel, and is taking prednisone as prescribed.   States that she has not been set up with specialist at this time.   Inquired if there is any other treatment options.   MD please advise.

## 2020-01-29 ENCOUNTER — Telehealth: Payer: Self-pay | Admitting: Family Medicine

## 2020-01-29 DIAGNOSIS — M5416 Radiculopathy, lumbar region: Secondary | ICD-10-CM | POA: Diagnosis not present

## 2020-01-29 DIAGNOSIS — M545 Low back pain: Secondary | ICD-10-CM | POA: Diagnosis not present

## 2020-01-29 NOTE — Telephone Encounter (Signed)
The spinal injection does not work automatically it may take a few days for pain relief She can take the gabapentin She can also take the ultram already prescribed

## 2020-01-29 NOTE — Telephone Encounter (Signed)
Attempted to contact patient. Line noted busy.   Call placed to patient daughter Frances Hughes. Advised that Gabapentin is less addictive than other pain medications and can help ease nerve pain. Advised to use as directed.   [name] also reports that patient had an injection into her spine with little to no relief.

## 2020-01-29 NOTE — Telephone Encounter (Signed)
CB# Daughter [name] [phone] call her mother had appt with a Sophronia Simas doctor today prescribe gabapentin would like to know if that will be okay to take

## 2020-01-30 NOTE — Telephone Encounter (Signed)
Attempted to contact patient. Line noted busy.

## 2020-01-31 NOTE — Telephone Encounter (Signed)
Call placed to patient and patient made aware.  

## 2020-02-03 DIAGNOSIS — M545 Low back pain: Secondary | ICD-10-CM | POA: Diagnosis not present

## 2020-02-03 DIAGNOSIS — M5416 Radiculopathy, lumbar region: Secondary | ICD-10-CM | POA: Diagnosis not present

## 2020-02-05 NOTE — Progress Notes (Signed)
Location of Breast Cancer: [facility] Left Breast  Did patient present with symptoms (if so, please note symptoms) or was this found on screening mammography?: Routine Mammogram  Mammogram: abnormal calcifications in the upper outer quadrant of left breast.  This measures approximately 1.5 cm in size.  Histology per Pathology Report: Left Breast 01/06/2020  Receptor Status: ER(100% +), PR (60% +), Her2-neu (), Ki-()   Past/Anticipated interventions by surgeon, if any: Dr. Ninfa Linden 01/17/2020 - We discussed that this is in situ and not invasive cancer that we would not need to do any lymph node biopsies. -With breast conservation I discussed proceeding with a radioactive seed guided left breast lumpectomy. -I will refer her to medical and radiation oncology regarding postoperative radiation as well as anti-hormonal therapy. -Left Breast Lumpectomy 02/19/2020  -Covid test 02/17/2020   Past/Anticipated interventions by medical oncology, if any: Chemotherapy  Dr. Jana Hakim 03/02/2020 4 pm   Lymphedema issues, if any:  No  Pain issues, if any:  No  SAFETY ISSUES:  Prior radiation? Radioactive iodine for thyroid 30+ years ago.  Pacemaker/ICD? No  Possible current pregnancy? Hysterectomy  Is the patient on methotrexate? No  Current Complaints / other details:      Cori Razor, RN 02/05/2020,3:30 PM

## 2020-02-06 ENCOUNTER — Encounter: Payer: Self-pay | Admitting: *Deleted

## 2020-02-06 ENCOUNTER — Ambulatory Visit
Admission: RE | Admit: 2020-02-06 | Discharge: 2020-02-06 | Disposition: A | Discharge status: Home / Self Care | Payer: Medicare Other | Source: Ambulatory Visit | Attending: Radiation Oncology | Admitting: Radiation Oncology

## 2020-02-06 ENCOUNTER — Other Ambulatory Visit: Payer: Self-pay

## 2020-02-06 ENCOUNTER — Encounter: Payer: Self-pay | Admitting: Radiation Oncology

## 2020-02-06 VITALS — Ht 63.0 in

## 2020-02-06 DIAGNOSIS — Z800 Family history of malignant neoplasm of digestive organs: Secondary | ICD-10-CM | POA: Diagnosis not present

## 2020-02-06 DIAGNOSIS — Z170 Estrogen receptor positive status [ER+]: Secondary | ICD-10-CM | POA: Diagnosis not present

## 2020-02-06 DIAGNOSIS — D0512 Intraductal carcinoma in situ of left breast: Secondary | ICD-10-CM

## 2020-02-06 NOTE — Progress Notes (Addendum)
Radiation Oncology         [phone] ________________________________  Initial Outpatient Consultation - Conducted via telephone due to current COVID-19 concerns for limiting patient exposure  I spoke with the patient to conduct this consult visit via telephone to spare the patient unnecessary potential exposure in the healthcare setting during the current COVID-19 pandemic. The patient was notified in advance and was offered a [place] meeting to allow for face to face communication but unfortunately reported that they did not have the appropriate resources/technology to support such a visit and instead preferred to proceed with a telephone consult.    Name: Frances Hughes        MRN: [id number]  Date of Service: 02/06/2020 DOB: Jul 23, 1955  SN:[id number], Modena Nunnery, MD  [name], MD     REFERRING PHYSICIAN: [name], MD   DIAGNOSIS: The encounter diagnosis was Ductal carcinoma in situ (DCIS) of left breast.   HISTORY OF PRESENT ILLNESS: Frances Hughes is a 64 y.o. female seen new diagnosis of left breast cancer. The patient was noted to have a screening detected calcifications in the left breast in the upper outer quadrant measuring approximately 1.5 cm in greatest dimension. She underwent a stereotactic biopsy on 01/06/2020 which revealed an ER/PR positive low-grade DCIS with calcifications.  She met with Dr. Ninfa Linden and has decided to pursue lumpectomy which she is scheduled for on 02/19/2020, and she will see Dr. Jana Hakim a few weeks after surgery.    PREVIOUS RADIATION THERAPY: No   PAST MEDICAL HISTORY:  Past Medical History:  Diagnosis Date  . Adrenal mass, left ([place])   . 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 Enlarged heart   . Grave's disease   . HTN (hypertension)   . Hyperlipidemia   . Lymphocytosis   . Nephrolithiasis   . OSA on CPAP        PAST SURGICAL HISTORY: Past Surgical History:  Procedure Laterality Date  . ABDOMINAL  HYSTERECTOMY    . COLONOSCOPY  2011   normal colonoscopy  . COLONOSCOPY WITH PROPOFOL N/A 04/16/2018   Procedure: COLONOSCOPY WITH PROPOFOL;  Surgeon: Daneil Dolin, MD;  [facility]: [facility];  Service: Endoscopy;  Laterality: N/A;  9:45am  . HERNIA REPAIR  2011  . LITHOTRIPSY    . POLYPECTOMY  04/16/2018   Procedure: POLYPECTOMY;  Surgeon: Daneil Dolin, MD;  Location: [facility];  Service: Endoscopy;;  (colon)  . S/P Hysterectomy  10/1996   with bilat SOO seconardt to fibroids  . thyroid ablation  1986     FAMILY HISTORY:  Family History  Problem Relation Age of Onset  . Colon cancer Mother   . Colon cancer Sister   . Liver cancer Sister   . Cancer Sister        Colon cancer     SOCIAL HISTORY:  reports that she quit smoking about 31 years ago. Her smoking use included cigarettes. She started smoking about 51 years ago. She has a 7.50 pack-year smoking history. She has never used smokeless tobacco. She reports that she does not drink alcohol and does not use drugs. The patient is retired from working in Orthoptist and has a 56 and 84 year old grandchildren that live with her.   ALLERGIES: Patient has no known allergies.   MEDICATIONS:  Current Outpatient Medications  Medication Sig Dispense Refill  . amLODipine (NORVASC) 10 MG tablet TAKE 1 TABLET BY MOUTH ONCE DAILY. [place]  tablet 3  . aspirin 81 MG tablet Take 1 tablet (81 mg total) by mouth daily. 30 tablet 1  . atorvastatin (LIPITOR) 40 MG tablet Take 1 tablet (40 mg total) by mouth daily. (Patient not taking: Reported on 01/17/2020) 90 tablet 1  . Blood Glucose Monitoring Suppl (BLOOD GLUCOSE MONITOR SYSTEM) W/DEVICE KIT Please dispense based on patient and insurance preference. Use to monitor fasting blood sugar 1x daily. Dx: E11.9 1 each 0  . carvedilol (COREG) 25 MG tablet Take 1 tablet (25 mg total) by mouth 2 (two) times daily. 180 tablet 3  . cloNIDine (CATAPRES) 0.1 MG tablet Take 1 tablet (0.1 mg total)  by mouth at bedtime. 30 tablet 3  . escitalopram (LEXAPRO) 10 MG tablet Take 1 tablet (10 mg total) by mouth at bedtime. (Patient not taking: Reported on 01/17/2020) 30 tablet 2  . furosemide (LASIX) 20 MG tablet TAKE (1) TABLET BY MOUTH ONCE DAILY AS NEEDED. 90 tablet 3  . Glucose Blood (BLOOD GLUCOSE TEST STRIPS) STRP Please dispense based on patient and insurance preference. Use to monitor fasting blood sugar 1x daily. Dx: E11.9 100 each 0  . Lancets 28G MISC Please dispense based on patient and insurance preference. Use to monitor fasting blood sugar 1x daily. Dx: E11.9 100 each 0  . levothyroxine (SYNTHROID) 200 MCG tablet TAKE 1 TABLET BY MOUTH EVERY DAY BEFORE BREAKFAST 30 tablet 3  . losartan (COZAAR) 100 MG tablet Take 1 tablet (100 mg total) by mouth daily. 90 tablet 3  . metFORMIN (GLUCOPHAGE) 500 MG tablet Take 1 tablet (500 mg total) by mouth 2 (two) times daily with a meal. 90 tablet 3  . methylPREDNISolone (MEDROL DOSEPAK) 4 MG TBPK tablet Use as directed on package. 21 tablet 0  . Multiple Vitamins-Minerals (CENTRUM SILVER PO) Take 1 tablet by mouth daily. (Patient not taking: Reported on 01/17/2020)    . naproxen (NAPROSYN) 500 MG tablet Take 1 tablet (500 mg total) by mouth 2 (two) times daily with a meal. 60 tablet 0  . potassium chloride SA (K-DUR,KLOR-CON) 20 MEQ tablet Take 1 tablet (20 mEq total) by mouth daily. 90 tablet 3  . tiZANidine (ZANAFLEX) 4 MG tablet TAKE 1 TABLET(4 MG) BY MOUTH EVERY 8 HOURS AS NEEDED FOR MUSCLE SPASMS 30 tablet 0  . traMADol (ULTRAM) 50 MG tablet Take 1 tablet (50 mg total) by mouth every 12 (twelve) hours as needed. 30 tablet 0   No current facility-administered medications for this encounter.     REVIEW OF SYSTEMS: On review of systems, the patient reports that she is doing well overall. She does have degenerative disc disease and low back pain as a result of this. Her breast is doing well without significant changes. She denies any chest pain,  shortness of breath, cough, fevers, chills, night sweats, unintended weight changes. She denies any bowel or bladder disturbances, and denies abdominal pain, nausea or vomiting. She denies any new musculoskeletal or joint aches or pains. A complete review of systems is obtained and is otherwise negative.     PHYSICAL EXAM:  Wt Readings from Last 3 Encounters:  01/17/20 259 lb (117.5 kg)  12/18/19 (!) 260 lb (117.9 kg)  11/11/19 259 lb (117.5 kg)   Unable to assess given encounter type   ECOG = 1  0 - Asymptomatic (Fully active, able to carry on all predisease activities without restriction)  1 - Symptomatic but completely ambulatory (Restricted in physically strenuous activity but ambulatory and able to carry out  work of a light or sedentary nature. For example, light housework, office work)  2 - Symptomatic, <50% in bed during the day (Ambulatory and capable of all self care but unable to carry out any work activities. Up and about more than 50% of waking hours)  3 - Symptomatic, >50% in bed, but not bedbound (Capable of only limited self-care, confined to bed or chair 50% or more of waking hours)  4 - Bedbound (Completely disabled. Cannot carry on any self-care. Totally confined to bed or chair)  5 - Death   Eustace Pen MM, Creech RH, Tormey DC, et al. [phone]). "Toxicity and response criteria of the [facility]". [place] Oncol. 5 (6): 649-55    LABORATORY DATA:  Lab Results  Component Value Date   WBC 7.4 12/18/2019   HGB 12.3 12/18/2019   HCT 40.0 12/18/2019   MCV 82.1 12/18/2019   PLT 273 12/18/2019   Lab Results  Component Value Date   NA 141 12/18/2019   K 4.0 12/18/2019   CL 102 12/18/2019   CO2 33 (H) 12/18/2019   Lab Results  Component Value Date   ALT 8 12/18/2019   AST 13 12/18/2019   ALKPHOS 91 01/06/2017   BILITOT 0.5 12/18/2019      RADIOGRAPHY: DG Lumbar Spine Complete  Result Date: 01/21/2020 CLINICAL DATA:  Acute low back  and right lower extremity pain. EXAM: LUMBAR SPINE - COMPLETE 4+ VIEW COMPARISON:  None. FINDINGS: No fracture or spondylolisthesis is noted. Moderate degenerative disc disease is noted at L3-4, L4-5 and L5-S1. IMPRESSION: Moderate multilevel degenerative disc disease. No acute abnormality seen in the lumbar spine. Electronically Signed   By: Marijo Conception M.D.   On: 01/21/2020 15:20       IMPRESSION/PLAN: 1. ER/PR positive, low grade DCIS with calcifications of the left breast. Dr. Lisbeth Renshaw discusses the pathology findings and reviews the nature of noninvasive left breast disease.  We reviewed the recommendations for breast conserving surgery which Dr. Ninfa Linden has offered and plans to proceed with on 02/19/2020.  We discussed the rationale for external radiotherapy to the breast to reduce the risks of local recurrence, as well as  antiestrogen therapy. We discussed the risks, benefits, short, and long term effects of radiotherapy, and the patient is interested in proceeding. Dr. Lisbeth Renshaw discusses the delivery and logistics of radiotherapy and anticipates a course of 4 weeks of radiotherapy with deep inspiration breath hold technique. We will see her back a few weeks after surgery to discuss the simulation process and anticipate we starting radiotherapy about 4-6 weeks after surgery.    Given current concerns for patient exposure during the COVID-19 pandemic, this encounter was conducted via telephone.  The patient has provided two factor identification and has given verbal consent for this type of encounter and has been advised to only accept a meeting of this type in a secure network environment. The time spent during this encounter was 45 minutes including preparation, discussion, and coordination of the patient's care. The attendants for this meeting include Blenda Nicely, RN, Dr. Lisbeth Renshaw, Hayden Pedro  and Jolyn Lent.  During the encounter,  Blenda Nicely, RN, Dr. Lisbeth Renshaw, and Hayden Pedro were located at [facility] Radiation Oncology Department.  Jolyn Lent was located at home.   The above documentation reflects my direct findings during this shared patient visit. Please see the separate note by Dr. Lisbeth Renshaw on this date for the remainder of the  patient's plan of care.    Carola Rhine, PAC

## 2020-02-06 NOTE — Progress Notes (Signed)
[place] Psychosocial Distress Screening Clinical Social Work  Clinical Social Work was referred by distress screening protocol.  The patient scored a 6 on the Psychosocial Distress Thermometer which indicates moderate distress. Clinical Social Worker contacted patient by phone to assess for distress and other psychosocial needs. Mrs. Frances Hughes reported no questions or concerns after her phone visit with radiation oncology today. She indicated her only source of distress was pain/difficulty sleeping from degenerative disc disease.  She is currently seeing ortho for treatment.    CSW briefly reviewed CSW role and support services- encouraged patient to follow up as needed.    ONCBCN DISTRESS SCREENING 02/06/2020  Screening Type Initial Screening  Distress experienced in past week (1-10) 6  Physical Problem type Pain;Sleep/insomnia  Other Contact via phone.    Clinical Social Worker follow up needed: No.  If yes, follow up plan:  Gwinda Maine, LCSW

## 2020-02-13 ENCOUNTER — Other Ambulatory Visit: Payer: Self-pay | Admitting: Family Medicine

## 2020-02-14 ENCOUNTER — Other Ambulatory Visit: Payer: Self-pay

## 2020-02-14 ENCOUNTER — Encounter (HOSPITAL_BASED_OUTPATIENT_CLINIC_OR_DEPARTMENT_OTHER): Payer: Self-pay | Admitting: Surgery

## 2020-02-15 ENCOUNTER — Other Ambulatory Visit (HOSPITAL_COMMUNITY): Payer: Medicare Other

## 2020-02-17 ENCOUNTER — Encounter (HOSPITAL_BASED_OUTPATIENT_CLINIC_OR_DEPARTMENT_OTHER)
Admission: RE | Admit: 2020-02-17 | Discharge: 2020-02-17 | Disposition: A | Discharge status: Home / Self Care | Payer: Medicare Other | Source: Ambulatory Visit | Attending: Surgery | Admitting: Surgery

## 2020-02-17 ENCOUNTER — Other Ambulatory Visit (HOSPITAL_COMMUNITY)
Admission: RE | Admit: 2020-02-17 | Discharge: 2020-02-17 | Disposition: A | Discharge status: Home / Self Care | Payer: Medicare Other | Source: Ambulatory Visit | Attending: Surgery | Admitting: Surgery

## 2020-02-17 DIAGNOSIS — Z20822 Contact with and (suspected) exposure to covid-19: Secondary | ICD-10-CM | POA: Insufficient documentation

## 2020-02-17 DIAGNOSIS — Z01812 Encounter for preprocedural laboratory examination: Secondary | ICD-10-CM | POA: Insufficient documentation

## 2020-02-17 LAB — BASIC METABOLIC PANEL
Anion gap: 12 (ref 5–15)
BUN: 18 mg/dL (ref 8–23)
CO2: 31 mmol/L (ref 22–32)
Calcium: 8.9 mg/dL (ref 8.9–10.3)
Chloride: 101 mmol/L (ref 98–111)
Creatinine, Ser: 0.85 mg/dL (ref 0.44–1.00)
GFR calc Af Amer: >60 mL/min (ref >60)
GFR calc non Af Amer: >60 mL/min (ref >60)
Glucose, Bld: 108 mg/dL — ABNORMAL HIGH (ref 70–99)
Potassium: 4.4 mmol/L (ref 3.5–5.1)
Sodium: 144 mmol/L (ref 135–145)

## 2020-02-17 LAB — SARS CORONAVIRUS 2 (TAT 6-24 HRS): SARS Coronavirus 2: NEGATIVE

## 2020-02-17 MED ORDER — ENSURE PRE-SURGERY PO LIQD: 296.0000 mL | Freq: Once | ORAL | Status: DC

## 2020-02-17 NOTE — Progress Notes (Signed)

## 2020-02-18 ENCOUNTER — Other Ambulatory Visit: Payer: Self-pay

## 2020-02-18 ENCOUNTER — Ambulatory Visit
Admission: RE | Admit: 2020-02-18 | Discharge: 2020-02-18 | Disposition: A | Discharge status: Home / Self Care | Payer: Medicare Other | Source: Ambulatory Visit | Attending: Surgery | Admitting: Surgery

## 2020-02-18 DIAGNOSIS — D0512 Intraductal carcinoma in situ of left breast: Secondary | ICD-10-CM | POA: Diagnosis not present

## 2020-02-18 NOTE — H&P (Signed)
Frances Hughes  Location: [facility] Patient #: [id number] DOB: 1955-12-20 Married / Language: English / Race: Black or African American Female   History of Present Illness  The patient is a 64 year old female who presents with breast cancer.  Chief complaint: DCIS of the left breast  This is a pleasant 64 year old female referred by the breast center. She was found on recent screening mammography to have abnormal calcifications in the upper outer quadrant left breast. This measured approximately 1.5 cm in size. She underwent a biopsy of this showing a low-grade ductal carcinoma in situ. It was 100% ER positive, 60% PR positive. She has had no previous history of breast cancer or problems with her breasts. She denies nipple discharge. There is a remote family history of breast cancer and several cousins. She is otherwise without complaints.   Past Surgical History Malachi Bonds, CMA;  Breast Biopsy  Left. Hysterectomy (not due to cancer) - Complete  Open Inguinal Hernia Surgery  Left.  Diagnostic Studies History Malachi Bonds, CMA;   Colonoscopy  1-5 years ago Mammogram  within last year Pap Smear  >5 years ago  Allergies Darden Palmer, RMA; No Known Drug Allergies  Allergies Reconciled   Medication History Darden Palmer, RMA; Atorvastatin Calcium (40MG  Tablet, Oral) Active. Levothyroxine Sodium (200MCG Tablet, Oral) Active. Losartan Potassium (100MG  Tablet, Oral) Active. metFORMIN HCl (500MG  Tablet, Oral) Active. Potassium Chloride [place]rys ER (20MEQ Tablet ER, Oral) Active. Escitalopram Oxalate (10MG  Tablet, Oral) Active. Furosemide (20MG  Tablet, Oral) Active. Aspirin (81MG  Tablet, Oral) Active. Medications Reconciled  Social History Alcohol use  Occasional alcohol use. Caffeine use  Carbonated beverages. No drug use  Tobacco use  Former smoker.  Family History Cancer  Brother, Father, Mother, Sister. Colon Cancer   Family Members In General, Sister. Colon Polyps  Brother, Mother, Sister. Diabetes Mellitus  Brother, Father. Hypertension  Brother, Father, Sister. Migraine Headache  Brother, Daughter, Mother, Son. Respiratory Condition  Sister.  Pregnancy / Birth History Malachi Bonds, CMA; ) Age at menarche  63 years. Age of menopause  70-50 Contraceptive History  Oral contraceptives. Gravida  4 Maternal age  14-25 Para  4 Regular periods   Other Problems Malachi Bonds, CMA;  Back Pain  Breast Cancer  Depression  Diabetes Mellitus  Gastroesophageal Reflux Disease  High blood pressure  Inguinal Hernia  Lump In Breast  Sleep Apnea  Thyroid Disease     Review of Systems (Chemira Jones CMA; General Not Present- Appetite Loss, Chills, Fatigue, Fever, Night Sweats, Weight Gain and Weight Loss. Skin Not Present- Change in Wart/Mole, Dryness, Hives, Jaundice, New Lesions, Non-Healing Wounds, Rash and Ulcer. HEENT Not Present- Earache, Hearing Loss, Hoarseness, Nose Bleed, Oral Ulcers, Ringing in the Ears, Seasonal Allergies, Sinus Pain, Sore Throat, Visual Disturbances, Wears glasses/contact lenses and Yellow Eyes. Respiratory Not Present- Bloody sputum, Chronic Cough, Difficulty Breathing, Snoring and Wheezing. Breast Present- Breast Mass. Not Present- Breast Pain, Nipple Discharge and Skin Changes. Cardiovascular Not Present- Chest Pain, Difficulty Breathing Lying Down, Leg Cramps, Palpitations, Rapid Heart Rate, Shortness of Breath and Swelling of Extremities. Gastrointestinal Not Present- Abdominal Pain, Bloating, Bloody Stool, Change in Bowel Habits, Chronic diarrhea, Constipation, Difficulty Swallowing, Excessive gas, Gets full quickly at meals, Hemorrhoids, Indigestion, Nausea, Rectal Pain and Vomiting. Female Genitourinary Present- Nocturia. Not Present- Frequency, Painful Urination, Pelvic Pain and Urgency. Musculoskeletal Not Present- Back Pain, Joint Pain, Joint  Stiffness, Muscle Pain, Muscle Weakness and Swelling of Extremities. Neurological Not Present- Decreased Memory, Fainting, Headaches, Numbness, Seizures,  Tingling, Tremor, Trouble walking and Weakness. Psychiatric Present- Depression. Not Present- Anxiety, Bipolar, Change in Sleep Pattern, Fearful and Frequent crying. Endocrine Not Present- Cold Intolerance, Excessive Hunger, Hair Changes, Heat Intolerance, Hot flashes and New Diabetes. Hematology Not Present- Blood Thinners, Easy Bruising, Excessive bleeding, Gland problems, HIV and Persistent Infections.  Vitals  Weight: 257.2 lb Height: 63in Body Surface Area: 2.15 m Body Mass Index: 45.56 kg/m  Temp.: 97.19F  Pulse: 84 (Regular)  BP: 142/88(Sitting, Left Arm, Standard)    Physical Exam  The physical exam findings are as follows: Note: She appears well in exam  Her breast for normal appearance with normal nipple areolar complexes. There are no breast masses. There is no axillary adenopathy on either side    Assessment & Plan   BREAST NEOPLASM, TIS (DCIS), LEFT (D05.12)  Impression: I reviewed her mammograms, ultrasound, and pathology results. We also discussed her case in our multidisciplinary breast cancer conference. She has ductal carcinoma in situ which is low-grade of left breast. I discussed the diagnosis with the patient and her husband and gave him the pathology results. We then discussed treatment options which include breast conservation with a lumpectomy and postoperative radiation versus a mastectomy with the intent for cure with both options. We discussed that this is in situ and not invasive cancer that we would not need to do any Lymph node biopsies. With breast conservation, I discussed proceeding with a radioactive seed guided left breast lumpectomy. I discussed procedure in detail. I discussed the risk which includes but is not limited to bleeding, infection, injury to surrounding structures, need for  further surgery if margins are positive, cardiopulmonary issues, postoperative recovery, etc. At this point, she would like to proceed with breast conservation. I will refer her to medical and radiation oncology regarding postoperative radiation as well as anti-hormonal therapy.

## 2020-02-19 ENCOUNTER — Ambulatory Visit
Admission: RE | Admit: 2020-02-19 | Discharge: 2020-02-19 | Disposition: A | Discharge status: Home / Self Care | Payer: Medicare Other | Source: Ambulatory Visit | Attending: Surgery | Admitting: Surgery

## 2020-02-19 ENCOUNTER — Ambulatory Visit (HOSPITAL_BASED_OUTPATIENT_CLINIC_OR_DEPARTMENT_OTHER): Payer: Medicare Other | Admitting: Certified Registered"

## 2020-02-19 ENCOUNTER — Encounter (HOSPITAL_BASED_OUTPATIENT_CLINIC_OR_DEPARTMENT_OTHER)
Admission: RE | Disposition: A | Discharge status: Home / Self Care | Payer: Self-pay | Source: Home / Self Care | Attending: Surgery

## 2020-02-19 ENCOUNTER — Other Ambulatory Visit: Payer: Self-pay | Admitting: General Surgery

## 2020-02-19 ENCOUNTER — Ambulatory Visit (HOSPITAL_BASED_OUTPATIENT_CLINIC_OR_DEPARTMENT_OTHER)
Admission: RE | Admit: 2020-02-19 | Discharge: 2020-02-19 | Disposition: A | Discharge status: Home / Self Care | Payer: Medicare Other | Attending: Surgery | Admitting: Surgery

## 2020-02-19 ENCOUNTER — Other Ambulatory Visit: Payer: Self-pay

## 2020-02-19 ENCOUNTER — Encounter (HOSPITAL_BASED_OUTPATIENT_CLINIC_OR_DEPARTMENT_OTHER): Payer: Self-pay | Admitting: Surgery

## 2020-02-19 DIAGNOSIS — N6012 Diffuse cystic mastopathy of left breast: Secondary | ICD-10-CM | POA: Diagnosis not present

## 2020-02-19 DIAGNOSIS — K219 Gastro-esophageal reflux disease without esophagitis: Secondary | ICD-10-CM | POA: Insufficient documentation

## 2020-02-19 DIAGNOSIS — Z820 Family history of epilepsy and other diseases of the nervous system: Secondary | ICD-10-CM | POA: Diagnosis not present

## 2020-02-19 DIAGNOSIS — Z170 Estrogen receptor positive status [ER+]: Secondary | ICD-10-CM | POA: Diagnosis not present

## 2020-02-19 DIAGNOSIS — N641 Fat necrosis of breast: Secondary | ICD-10-CM | POA: Diagnosis not present

## 2020-02-19 DIAGNOSIS — Z8371 Family history of colonic polyps: Secondary | ICD-10-CM | POA: Diagnosis not present

## 2020-02-19 DIAGNOSIS — G4730 Sleep apnea, unspecified: Secondary | ICD-10-CM | POA: Diagnosis not present

## 2020-02-19 DIAGNOSIS — Z836 Family history of other diseases of the respiratory system: Secondary | ICD-10-CM | POA: Diagnosis not present

## 2020-02-19 DIAGNOSIS — Z87891 Personal history of nicotine dependence: Secondary | ICD-10-CM | POA: Insufficient documentation

## 2020-02-19 DIAGNOSIS — Z833 Family history of diabetes mellitus: Secondary | ICD-10-CM | POA: Diagnosis not present

## 2020-02-19 DIAGNOSIS — D0512 Intraductal carcinoma in situ of left breast: Secondary | ICD-10-CM

## 2020-02-19 DIAGNOSIS — Z8349 Family history of other endocrine, nutritional and metabolic diseases: Secondary | ICD-10-CM | POA: Insufficient documentation

## 2020-02-19 DIAGNOSIS — Z8249 Family history of ischemic heart disease and other diseases of the circulatory system: Secondary | ICD-10-CM | POA: Diagnosis not present

## 2020-02-19 DIAGNOSIS — Z90710 Acquired absence of both cervix and uterus: Secondary | ICD-10-CM | POA: Insufficient documentation

## 2020-02-19 DIAGNOSIS — Z803 Family history of malignant neoplasm of breast: Secondary | ICD-10-CM | POA: Diagnosis not present

## 2020-02-19 DIAGNOSIS — I10 Essential (primary) hypertension: Secondary | ICD-10-CM | POA: Diagnosis not present

## 2020-02-19 DIAGNOSIS — Z800 Family history of malignant neoplasm of digestive organs: Secondary | ICD-10-CM | POA: Insufficient documentation

## 2020-02-19 DIAGNOSIS — E119 Type 2 diabetes mellitus without complications: Secondary | ICD-10-CM | POA: Insufficient documentation

## 2020-02-19 DIAGNOSIS — E039 Hypothyroidism, unspecified: Secondary | ICD-10-CM | POA: Diagnosis not present

## 2020-02-19 DIAGNOSIS — F329 Major depressive disorder, single episode, unspecified: Secondary | ICD-10-CM | POA: Diagnosis not present

## 2020-02-19 DIAGNOSIS — N6092 Unspecified benign mammary dysplasia of left breast: Secondary | ICD-10-CM | POA: Diagnosis not present

## 2020-02-19 DIAGNOSIS — Z8489 Family history of other specified conditions: Secondary | ICD-10-CM | POA: Diagnosis not present

## 2020-02-19 DIAGNOSIS — N6489 Other specified disorders of breast: Secondary | ICD-10-CM | POA: Diagnosis not present

## 2020-02-19 HISTORY — PX: BREAST LUMPECTOMY WITH RADIOACTIVE SEED LOCALIZATION: SHX6424

## 2020-02-19 LAB — GLUCOSE, CAPILLARY
Glucose-Capillary: 92 mg/dL (ref 70–99)
Glucose-Capillary: 88 mg/dL (ref 70–99)

## 2020-02-19 SURGERY — BREAST LUMPECTOMY WITH RADIOACTIVE SEED LOCALIZATION
Anesthesia: General | Site: Breast | Laterality: Left

## 2020-02-19 MED ORDER — CHLORHEXIDINE GLUCONATE CLOTH 2 % EX PADS: 6.0000 | MEDICATED_PAD | Freq: Once | CUTANEOUS | Status: DC

## 2020-02-19 MED ORDER — HYDROMORPHONE HCL 1 MG/ML IJ SOLN: 0.2500 mg | INTRAMUSCULAR | Status: DC | PRN

## 2020-02-19 MED ORDER — LIDOCAINE 2% (20 MG/ML) 5 ML SYRINGE
INTRAMUSCULAR | Status: AC
  Filled 2020-02-19: qty 5

## 2020-02-19 MED ORDER — BUPIVACAINE HCL (PF) 0.25 % IJ SOLN
INTRAMUSCULAR | Status: DC | PRN
  Administered 2020-02-19: 10 mL

## 2020-02-19 MED ORDER — CEFAZOLIN SODIUM-DEXTROSE 2-4 GM/100ML-% IV SOLN
2.0000 g | INTRAVENOUS | Status: AC
  Administered 2020-02-19: 2 g via INTRAVENOUS

## 2020-02-19 MED ORDER — LACTATED RINGERS IV SOLN: INTRAVENOUS | Status: DC

## 2020-02-19 MED ORDER — MIDAZOLAM HCL 5 MG/5ML IJ SOLN
INTRAMUSCULAR | Status: DC | PRN
  Administered 2020-02-19: 2 mg via INTRAVENOUS

## 2020-02-19 MED ORDER — ONDANSETRON HCL 4 MG/2ML IJ SOLN
INTRAMUSCULAR | Status: DC | PRN
  Administered 2020-02-19: 4 mg via INTRAVENOUS

## 2020-02-19 MED ORDER — ONDANSETRON HCL 4 MG/2ML IJ SOLN
INTRAMUSCULAR | Status: AC
  Filled 2020-02-19: qty 2

## 2020-02-19 MED ORDER — ONDANSETRON HCL 4 MG/2ML IJ SOLN: 4.0000 mg | Freq: Once | INTRAMUSCULAR | Status: DC | PRN

## 2020-02-19 MED ORDER — TRAMADOL HCL 50 MG PO TABS: 50.0000 mg | ORAL_TABLET | Freq: Four times a day (QID) | ORAL | 0 refills | Status: DC | PRN

## 2020-02-19 MED ORDER — LIDOCAINE HCL (CARDIAC) PF 100 MG/5ML IV SOSY
PREFILLED_SYRINGE | INTRAVENOUS | Status: DC | PRN
  Administered 2020-02-19: 100 mg via INTRAVENOUS

## 2020-02-19 MED ORDER — MIDAZOLAM HCL 2 MG/2ML IJ SOLN
INTRAMUSCULAR | Status: AC
  Filled 2020-02-19: qty 2

## 2020-02-19 MED ORDER — FENTANYL CITRATE (PF) 100 MCG/2ML IJ SOLN
INTRAMUSCULAR | Status: DC | PRN
Start: 2020-02-19 — End: 2020-02-19
  Administered 2020-02-19 (×2): 25 ug via INTRAVENOUS

## 2020-02-19 MED ORDER — DEXAMETHASONE SODIUM PHOSPHATE 10 MG/ML IJ SOLN
INTRAMUSCULAR | Status: AC
  Filled 2020-02-19: qty 1

## 2020-02-19 MED ORDER — ACETAMINOPHEN 325 MG PO TABS: 325.0000 mg | ORAL_TABLET | ORAL | Status: DC | PRN

## 2020-02-19 MED ORDER — PROPOFOL 10 MG/ML IV BOLUS
INTRAVENOUS | Status: AC
  Filled 2020-02-19: qty 20

## 2020-02-19 MED ORDER — ACETAMINOPHEN 160 MG/5ML PO SOLN: 325.0000 mg | ORAL | Status: DC | PRN

## 2020-02-19 MED ORDER — CEFAZOLIN SODIUM-DEXTROSE 2-4 GM/100ML-% IV SOLN
INTRAVENOUS | Status: AC
  Filled 2020-02-19: qty 100

## 2020-02-19 MED ORDER — FENTANYL CITRATE (PF) 100 MCG/2ML IJ SOLN
INTRAMUSCULAR | Status: AC
  Filled 2020-02-19: qty 2

## 2020-02-19 MED ORDER — DEXAMETHASONE SODIUM PHOSPHATE 10 MG/ML IJ SOLN
INTRAMUSCULAR | Status: DC | PRN
  Administered 2020-02-19: 5 mg via INTRAVENOUS

## 2020-02-19 MED ORDER — PROPOFOL 10 MG/ML IV BOLUS
INTRAVENOUS | Status: DC | PRN
  Administered 2020-02-19: 150 mg via INTRAVENOUS

## 2020-02-19 SURGICAL SUPPLY — 49 items
ADH SKN CLS APL DERMABOND .7 (GAUZE/BANDAGES/DRESSINGS) ×1
APL PRP STRL LF DISP 70% ISPRP (MISCELLANEOUS) ×1
APPLIER CLIP 9.375 MED OPEN (MISCELLANEOUS) ×2
APR CLP MED 9.3 20 MLT OPN (MISCELLANEOUS) ×1
BINDER BREAST 3XL (GAUZE/BANDAGES/DRESSINGS) IMPLANT
BINDER BREAST LRG (GAUZE/BANDAGES/DRESSINGS) IMPLANT
BINDER BREAST MEDIUM (GAUZE/BANDAGES/DRESSINGS) IMPLANT
BINDER BREAST XLRG (GAUZE/BANDAGES/DRESSINGS) IMPLANT
BINDER BREAST XXLRG (GAUZE/BANDAGES/DRESSINGS) IMPLANT
BLADE SURG 15 STRL LF DISP TIS (BLADE) ×1 IMPLANT
BLADE SURG 15 STRL SS (BLADE) ×2
CANISTER SUC SOCK COL 7IN (MISCELLANEOUS) IMPLANT
CANISTER SUCT 1200ML W/VALVE (MISCELLANEOUS) IMPLANT
CHLORAPREP W/TINT 26 (MISCELLANEOUS) ×2 IMPLANT
CLIP APPLIE 9.375 MED OPEN (MISCELLANEOUS) IMPLANT
COVER BACK TABLE 60X90IN (DRAPES) ×2 IMPLANT
COVER MAYO STAND STRL (DRAPES) ×2 IMPLANT
COVER PROBE W GEL 5X96 (DRAPES) ×2 IMPLANT
COVER WAND RF STERILE (DRAPES) IMPLANT
DECANTER SPIKE VIAL GLASS SM (MISCELLANEOUS) IMPLANT
DERMABOND ADVANCED (GAUZE/BANDAGES/DRESSINGS) ×1
DERMABOND ADVANCED .7 DNX12 (GAUZE/BANDAGES/DRESSINGS) ×1 IMPLANT
DRAPE LAPAROSCOPIC ABDOMINAL (DRAPES) ×2 IMPLANT
DRAPE UTILITY XL STRL (DRAPES) ×2 IMPLANT
ELECT REM PT RETURN 9FT ADLT (ELECTROSURGICAL) ×2
ELECTRODE REM PT RTRN 9FT ADLT (ELECTROSURGICAL) ×1 IMPLANT
GAUZE SPONGE 4X4 12PLY STRL LF (GAUZE/BANDAGES/DRESSINGS) IMPLANT
GLOVE SURG SIGNA 7.5 PF LTX (GLOVE) ×2 IMPLANT
GOWN STRL REUS W/ TWL LRG LVL3 (GOWN DISPOSABLE) ×1 IMPLANT
GOWN STRL REUS W/ TWL XL LVL3 (GOWN DISPOSABLE) ×1 IMPLANT
GOWN STRL REUS W/TWL LRG LVL3 (GOWN DISPOSABLE) ×2
GOWN STRL REUS W/TWL XL LVL3 (GOWN DISPOSABLE) ×2
KIT MARKER MARGIN INK (KITS) ×2 IMPLANT
NDL HYPO 25X1 1.5 SAFETY (NEEDLE) ×1 IMPLANT
NEEDLE HYPO 25X1 1.5 SAFETY (NEEDLE) ×2 IMPLANT
NS IRRIG 1000ML POUR BTL (IV SOLUTION) IMPLANT
PACK BASIN DAY SURGERY FS (CUSTOM PROCEDURE TRAY) ×2 IMPLANT
PENCIL SMOKE EVACUATOR (MISCELLANEOUS) ×2 IMPLANT
SLEEVE SCD COMPRESS KNEE MED (MISCELLANEOUS) ×2 IMPLANT
SPONGE LAP 4X18 RFD (DISPOSABLE) ×2 IMPLANT
SUT MNCRL AB 4-0 PS2 18 (SUTURE) ×2 IMPLANT
SUT SILK 2 0 SH (SUTURE) IMPLANT
SUT VIC AB 3-0 SH 27 (SUTURE) ×2
SUT VIC AB 3-0 SH 27X BRD (SUTURE) ×1 IMPLANT
SYR CONTROL 10ML LL (SYRINGE) ×2 IMPLANT
TOWEL GREEN STERILE FF (TOWEL DISPOSABLE) ×2 IMPLANT
TRAY FAXITRON CT DISP (TRAY / TRAY PROCEDURE) ×2 IMPLANT
TUBE CONNECTING 20X1/4 (TUBING) IMPLANT
YANKAUER SUCT BULB TIP NO VENT (SUCTIONS) IMPLANT

## 2020-02-19 NOTE — Op Note (Signed)
LEFT BREAST LUMPECTOMY WITH RADIOACTIVE SEED LOCALIZATION  Procedure Note  Frances Hughes 02/19/2020   Pre-op Diagnosis: DUCTAL CARCINOMA IN SITU LEFT BREAST     Post-op Diagnosis: SAME  Procedure(s): LEFT BREAST LUMPECTOMY WITH RADIOACTIVE SEED LOCALIZATION  Surgeon(s): [name], MD Carlena Hurl, PA-C  Anesthesia: General  Staff:  Circulator: Carson Myrtle, RN Scrub Person: Burna Sis, RN  Estimated Blood Loss: Minimal               Specimens: sent to path  Indications: This is a 64 year old female who was found to have abnormal calcifications in the upper outer quadrant of the left breast.  She underwent a stereotactic biopsy showing ductal carcinoma in situ.  The decision was made to proceed with a radioactive seed guided lumpectomy of the left breast remove the DCIS.  Procedure: The patient was brought to the operating room and identifies correct patient.  She is placed upon the operating room table and general anesthesia was induced.  Her left breast was prepped and draped in usual sterile fashion.  Then the neoprobe I identified the radioactive seed in the upper outer quadrant of the left breast near the axilla.  I anesthetized skin with Marcaine over this area and made incision with a scalpel.  I then dissected down to the deep breast tissue with electrocautery and the aid of the neoprobe.  The radioactive seed was near the chest wall.  I performed a wide lumpectomy staying widely around the radioactive seed.  The seed was posterior to the calcifications by report.  The previous tissue marker had migrated 4 cm away so was left out of the dissection.  Once the lumpectomy was completed I confirmed that the seed was in the specimen with the neoprobe.  I marked the margins with marker paint.  The specimen was x-rayed confirming that the radioactive seed was in the specimen.  It was then sent to pathology for evaluation.  I achieved hemostasis with the cautery.  I then  placed surgical clips around the periphery of the lumpectomy cavity.  The subcutaneous tissue was then closed with interrupted 3-0 Vicryl sutures and the skin was closed with a running 4-0 Monocryl.  Dermabond was then applied.  The patient tolerated the procedure well.  All the counts were correct at the end of the procedure.  The patient was then extubated in the operating room and taken in a stable condition to the recovery room.          [name]   Date: 02/19/2020  Time: 2:16 PM

## 2020-02-19 NOTE — Transfer of Care (Signed)
Immediate Anesthesia Transfer of Care Note  Patient: Frances Hughes  Procedure(s) Performed: LEFT BREAST LUMPECTOMY WITH RADIOACTIVE SEED LOCALIZATION (Left Breast)  Patient Location: PACU  Anesthesia Type:General  Level of Consciousness: awake, alert  and oriented  Airway & Oxygen Therapy: Patient Spontanous Breathing and Patient connected to face mask oxygen  Post-op Assessment: Report given to RN and Post -op Vital signs reviewed and stable  Post vital signs: Reviewed and stable  Last Vitals:  Vitals Value Taken Time  BP    Temp    Pulse 76 02/19/20 1422  Resp 24 02/19/20 1422  SpO2 100 % 02/19/20 1422  Vitals shown include unvalidated device data.  Last Pain:  Vitals:   02/19/20 1327  TempSrc: Oral  PainSc: 0-No pain         Complications: No complications documented.

## 2020-02-19 NOTE — Anesthesia Preprocedure Evaluation (Signed)
Anesthesia Evaluation  Patient identified by MRN, date of birth, ID band Patient awake    Reviewed: Patient's Chart, lab work & pertinent test results  Airway Mallampati: II  TM Distance: >3 FB Neck ROM: Full    Dental  (+) Partial Upper, Teeth Intact   Pulmonary sleep apnea and Continuous Positive Airway Pressure Ventilation , former smoker,    Pulmonary exam normal        Cardiovascular hypertension, Pt. on medications  Rhythm:Regular Rate:Normal     Neuro/Psych Depression    GI/Hepatic Neg liver ROS, GERD  ,  Endo/Other  diabetes, Well Controlled, Type 2Hypothyroidism   Renal/GU   negative genitourinary   Musculoskeletal Left breast ductal carcinoma   Abdominal (+)  Abdomen: soft. Bowel sounds: normal.  Peds  Hematology negative hematology ROS (+)   Anesthesia Other Findings   Reproductive/Obstetrics                             Anesthesia Physical Anesthesia Plan  ASA: II  Anesthesia Plan: General   Post-op Pain Management:    Induction:   PONV Risk Score and Plan: 3 and Ondansetron, Dexamethasone and Treatment may vary due to age or medical condition  Airway Management Planned: LMA and Mask  Additional Equipment: None  Intra-op Plan:   Post-operative Plan: Extubation in OR  Informed Consent: I have reviewed the patients History and Physical, chart, labs and discussed the procedure including the risks, benefits and alternatives for the proposed anesthesia with the patient or authorized representative who has indicated his/her understanding and acceptance.     Dental advisory given  Plan Discussed with: CRNA  Anesthesia Plan Comments: (Covid-19 Nucleic Acid Test Results Lab Results      Component                Value               Date                      [place]              NEGATIVE            02/17/2020          )        Anesthesia Quick  Evaluation

## 2020-02-19 NOTE — Discharge Instructions (Signed)
[place] Office Phone Number [phone]  BREAST BIOPSY/ PARTIAL MASTECTOMY: POST OP INSTRUCTIONS  Always review your discharge instruction sheet given to you by the facility where your surgery was performed.  IF YOU HAVE DISABILITY OR FAMILY LEAVE FORMS, YOU MUST BRING THEM TO THE OFFICE FOR PROCESSING.  DO NOT GIVE THEM TO YOUR DOCTOR.  1. A prescription for pain medication may be given to you upon discharge.  Take your pain medication as prescribed, if needed.  If narcotic pain medicine is not needed, then you may take acetaminophen (Tylenol) or ibuprofen (Advil) as needed. 2. Take your usually prescribed medications unless otherwise directed 3. If you need a refill on your pain medication, please contact your pharmacy.  They will contact our office to request authorization.  Prescriptions will not be filled after 5pm or on week-ends. 4. You should eat very light the first 24 hours after surgery, such as soup, crackers, pudding, etc.  Resume your normal diet the day after surgery. 5. Most patients will experience some swelling and bruising in the breast.  Ice packs and a good support bra will help.  Swelling and bruising can take several days to resolve.  6. It is common to experience some constipation if taking pain medication after surgery.  Increasing fluid intake and taking a stool softener will usually help or prevent this problem from occurring.  A mild laxative (Milk of Magnesia or Miralax) should be taken according to package directions if there are no bowel movements after 48 hours. 7. Unless discharge instructions indicate otherwise, you may remove your bandages 24-48 hours after surgery, and you may shower at that time.  You may have steri-strips (small skin tapes) in place directly over the incision.  These strips should be left on the skin for 7-10 days.  If your surgeon used skin glue on the incision, you may shower in 24 hours.  The glue will flake off over the  next 2-3 weeks.  Any sutures or staples will be removed at the office during your follow-up visit. 8. ACTIVITIES:  You may resume regular daily activities (gradually increasing) beginning the next day.  Wearing a good support bra or sports bra minimizes pain and swelling.  You may have sexual intercourse when it is comfortable. a. You may drive when you no longer are taking prescription pain medication, you can comfortably wear a seatbelt, and you can safely maneuver your car and apply brakes. b. RETURN TO WORK:  ______________________________________________________________________________________ 9. You should see your doctor in the office for a follow-up appointment approximately two weeks after your surgery.  Your doctor's nurse will typically make your follow-up appointment when she calls you with your pathology report.  Expect your pathology report 2-3 business days after your surgery.  You may call to check if you do not hear from [place] after three days. 10. OTHER INSTRUCTIONS:YOU MAY SHOWER STARTING TOMORROW 11. ICE PACK, TYLENOL, AND IBUPROFEN ALSO FOR PAIN 12. NO VIGOROUS ACTIVITY FOR ONE WEEKS _______________________________________________________________________________________________ _____________________________________________________________________________________________________________________________________ _____________________________________________________________________________________________________________________________________ _____________________________________________________________________________________________________________________________________  WHEN TO CALL YOUR DOCTOR: 1. Fever over 101.0 2. Nausea and/or vomiting. 3. Extreme swelling or bruising. 4. Continued bleeding from incision. 5. Increased pain, redness, or drainage from the incision.  The clinic staff is available to answer your questions during regular business hours.  Please don't hesitate  to call and ask to speak to one of the nurses for clinical concerns.  If you have a medical emergency, go to the nearest emergency room or call 911.  A  surgeon from [facility] is always on call at the hospital.  For further questions, please visit [email or web address]     Post Anesthesia Home Care Instructions  Activity: Get plenty of rest for the remainder of the day. A responsible individual must stay with you for 24 hours following the procedure.  For the next 24 hours, DO NOT: -Drive a car -Paediatric nurse -Drink alcoholic beverages -Take any medication unless instructed by your physician -Make any legal decisions or sign important papers.  Meals: Start with liquid foods such as gelatin or soup. Progress to regular foods as tolerated. Avoid greasy, spicy, heavy foods. If nausea and/or vomiting occur, drink only clear liquids until the nausea and/or vomiting subsides. Call your physician if vomiting continues.  Special Instructions/Symptoms: Your throat may feel dry or sore from the anesthesia or the breathing tube placed in your throat during surgery. If this causes discomfort, gargle with warm salt water. The discomfort should disappear within 24 hours.  If you had a scopolamine patch placed behind your ear for the management of post- operative nausea and/or vomiting:  1. The medication in the patch is effective for 72 hours, after which it should be removed.  Wrap patch in a tissue and discard in the trash. Wash hands thoroughly with soap and water. 2. You may remove the patch earlier than 72 hours if you experience unpleasant side effects which may include dry mouth, dizziness or visual disturbances. 3. Avoid touching the patch. Wash your hands with soap and water after contact with the patch.

## 2020-02-19 NOTE — Anesthesia Procedure Notes (Signed)
Procedure Name: LMA Insertion Date/Time: 02/19/2020 1:43 PM Performed by: Lavonia Dana, CRNA Pre-anesthesia Checklist: Patient identified, Emergency Drugs available, Suction available and Patient being monitored Patient Re-evaluated:Patient Re-evaluated prior to induction Oxygen Delivery Method: Circle system utilized Preoxygenation: Pre-oxygenation with 100% oxygen Induction Type: IV induction Ventilation: Mask ventilation without difficulty LMA: LMA inserted LMA Size: 4.0 Number of attempts: 1 Airway Equipment and Method: Bite block Placement Confirmation: positive ETCO2 Tube secured with: Tape Dental Injury: Teeth and Oropharynx as per pre-operative assessment

## 2020-02-19 NOTE — Interval H&P Note (Signed)
History and Physical Interval Note: no change in H and P   02/19/2020 1:02 PM  Frances Hughes  has presented today for surgery, with the diagnosis of DUCTAL CARCINOMA LEFT BREAST.  The various methods of treatment have been discussed with the patient and family. After consideration of risks, benefits and other options for treatment, the patient has consented to  Procedure(s): LEFT BREAST LUMPECTOMY WITH RADIOACTIVE SEED LOCALIZATION (Left) as a surgical intervention.  The patient's history has been reviewed, patient examined, no change in status, stable for surgery.  I have reviewed the patient's chart and labs.  Questions were answered to the patient's satisfaction.     [name]

## 2020-02-20 ENCOUNTER — Encounter (HOSPITAL_BASED_OUTPATIENT_CLINIC_OR_DEPARTMENT_OTHER): Payer: Self-pay | Admitting: Surgery

## 2020-02-20 ENCOUNTER — Telehealth: Payer: Self-pay | Admitting: *Deleted

## 2020-02-20 NOTE — Anesthesia Postprocedure Evaluation (Signed)
Anesthesia Post Note  Patient: Frances Hughes  Procedure(s) Performed: LEFT BREAST LUMPECTOMY WITH RADIOACTIVE SEED LOCALIZATION (Left Breast)     Patient location during evaluation: PACU Anesthesia Type: General Level of consciousness: awake and alert Pain management: pain level controlled Vital Signs Assessment: post-procedure vital signs reviewed and stable Respiratory status: spontaneous breathing, nonlabored ventilation, respiratory function stable and patient connected to nasal cannula oxygen Cardiovascular status: blood pressure returned to baseline and stable Postop Assessment: no apparent nausea or vomiting Anesthetic complications: no   No complications documented.  Last Vitals:  Vitals:   02/19/20 1500 02/19/20 1528  BP: (!) 169/100 (!) 145/75  Pulse: 65 62  Resp: (!) 21 20  Temp:  36.6 C  SpO2: 100% 95%    Last Pain:  Vitals:   02/19/20 1528  TempSrc:   PainSc: 0-No pain                 Belenda Cruise P Timothee Gali

## 2020-02-20 NOTE — Telephone Encounter (Signed)
Received fax from Perlie Mayo, patient daughter for FMLA forms.   Reports that she is caring for patient along with her sister and father.   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 Radiation treatments begin on 03/10/2020.  Patient daughter is unsure how much time she will require off.   Verbalized that fee may be charged and is per provider prerogative.   Forms routed to provider.

## 2020-02-21 NOTE — Telephone Encounter (Signed)
I will need to know if this is for intermittent leave or if she is taking a continuous leave and when that will start

## 2020-02-21 NOTE — Telephone Encounter (Signed)
Intermittent leave beginning on 02/20/2020 (daughter took 1/2 day off work for surgery). Unsure of the frequency of upcoming appointments for F/U and treatments.

## 2020-02-24 ENCOUNTER — Encounter: Payer: Self-pay | Admitting: *Deleted

## 2020-02-24 ENCOUNTER — Other Ambulatory Visit: Payer: Self-pay | Admitting: *Deleted

## 2020-02-24 DIAGNOSIS — D0512 Intraductal carcinoma in situ of left breast: Secondary | ICD-10-CM

## 2020-02-24 LAB — SURGICAL PATHOLOGY

## 2020-02-24 NOTE — Progress Notes (Signed)
error 

## 2020-02-25 ENCOUNTER — Telehealth: Payer: Self-pay | Admitting: *Deleted

## 2020-02-25 NOTE — Telephone Encounter (Signed)
Received completed FMLA from provider.   No charge per provider.   E-mailed to patient daughter.

## 2020-02-25 NOTE — Telephone Encounter (Signed)
Received fax from [facility], patient daughter for [facility] forms.   Reports that she is caring for patient along with her sister and father.   Job title: Geophysical data processor, [employer], [employer] of Work: M-F, 8:30am- 5pm.   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  Patient daughter is unsure how much time she will require off.   Verbalized that fee may be charged and is per provider prerogative.   Forms routed to provider.

## 2020-02-25 NOTE — Telephone Encounter (Signed)
Noted  

## 2020-02-27 NOTE — Telephone Encounter (Signed)
Completed forms e-mailed to patient daughter.

## 2020-02-28 ENCOUNTER — Other Ambulatory Visit: Payer: Self-pay

## 2020-02-28 DIAGNOSIS — D0512 Intraductal carcinoma in situ of left breast: Secondary | ICD-10-CM

## 2020-03-01 NOTE — Progress Notes (Signed)
[place]  Telephone:([phone]) [phone] Fax:(336) [phone]     ID: [name] DOB: [birth year]  MR#: [id number]  VPX#:[id number]  Patient Care Team: Alycia Rossetti, MD as PCP - General (Family Medicine) Harl Bowie, Alphonse Guild, MD as PCP - Cardiology (Cardiology) Gala Romney Cristopher Estimable, MD as Consulting Physician (Gastroenterology) Mauro Kaufmann, RN as Oncology Nurse Navigator Rockwell Germany, RN as Oncology Nurse Navigator Marili Vader, Frances Dad, MD as Consulting Physician (Oncology) Frances Rudd, MD as Consulting Physician (Radiation Oncology) Frances Keens, MD as Consulting Physician (General Surgery) Frances Cruel, MD OTHER MD:  CHIEF COMPLAINT: noninvasive breast cancer, estrogen receptor positive  CURRENT TREATMENT: Adjuvant radiation pending   HISTORY OF CURRENT ILLNESS: Frances Hughes had routine screening mammography on 12/12/2019 showing a possible abnormality in the left breast. She underwent left diagnostic mammography with tomography at [facility] on 12/24/2019 showing: breast density category B; upper-outer left breast calcifications spanning 1.5 cm.  Accordingly on 01/06/2020 she proceeded to biopsy of the left breast area in question. The pathology from this procedure ([id number]) showed: ductal carcinoma in situ, low grade, with calcifications. Prognostic indicators significant for: estrogen receptor, 100% positive with strong staining intensity and progesterone receptor, 60% positive with weak staining intensity.   She opted to proceed with lumpectomy on 02/19/2020 under Dr. Ninfa Linden. Pathology from the procedure [id number]) showed: ductal carcinoma in situ, low to intermediate grade, 1.5 cm, with calcifications and focal necrosis; lobular neoplasia; margins not involved.  The patient's subsequent history is as detailed below.   INTERVAL HISTORY: Frances Hughes was evaluated in the breast cancer clinic on 03/02/2020 accompanied by her husband Frances Hughes.   Her case was also presented at the multidisciplinary breast cancer conference on 01/15/2020.  At that time a preliminary plan was proposed: Consideration of the, trial, lumpectomy, genetics.  She met with Dr. Lisbeth Hughes on 02/06/2020 to discuss radiation therapy. She is scheduled to return for follow up and CT simulation on 03/19/2020.   REVIEW OF SYSTEMS: There were no specific symptoms leading to the original mammogram, which was routinely scheduled. The patient denies unusual headaches, visual changes, nausea, vomiting, stiff neck, dizziness, or gait imbalance. There has been no cough, phlegm production, or pleurisy, no chest pain or pressure, and no change in bowel or bladder habits. The patient denies fever, rash, bleeding, unexplained fatigue or unexplained weight loss.  She has significant problems with degenerative disc disease and this keeps her from sleeping or exercising.  She has had the Moderna vaccine x2.  A detailed review of systems was otherwise noncontributory   PAST MEDICAL HISTORY: Past Medical History:  Diagnosis Date  . Adrenal mass, left ([place])   . DM (diabetes mellitus) ([place])    Type II controlled  . Enlarged heart   . Grave's disease   . HTN (hypertension)   . Hyperlipidemia   . Lymphocytosis   . Nephrolithiasis   . OSA on CPAP     PAST SURGICAL HISTORY: Past Surgical History:  Procedure Laterality Date  . ABDOMINAL HYSTERECTOMY    . BREAST BIOPSY Left 01/06/2020  . BREAST LUMPECTOMY WITH RADIOACTIVE SEED LOCALIZATION Left 02/19/2020   Procedure: LEFT BREAST LUMPECTOMY WITH RADIOACTIVE SEED LOCALIZATION;  Surgeon: Frances Keens, MD;  Location: [facility];  Service: General;  Laterality: Left;  . COLONOSCOPY  2011   normal colonoscopy  . COLONOSCOPY WITH PROPOFOL N/A 04/16/2018   Procedure: COLONOSCOPY WITH PROPOFOL;  Surgeon: Daneil Dolin, MD;  [facility];  Service: Endoscopy;  Laterality: N/A;  9:45am  . HERNIA REPAIR  2011  .  LITHOTRIPSY    . POLYPECTOMY  04/16/2018   Procedure: POLYPECTOMY;  Surgeon: Daneil Dolin, MD;  [facility];  Service: Endoscopy;;  (colon)  . S/P Hysterectomy  10/1996   with bilat SOO seconardt to fibroids  . thyroid ablation  1986    FAMILY HISTORY: Family History  Problem Relation Age of Onset  . Colon cancer Mother   . Colon cancer Sister   . Liver cancer Sister   . Cancer Sister        Colon cancer  The patient's father died from lung cancer at the age of 42.  He worked in [place] and also smoked.  The patient's mother had "cancer all over her abdomen" and died from bowel perforation.  The patient tells me she has 4 cousins with colon cancer and a sister who died from colon cancer.  One brother died from esophageal cancer.   GYNECOLOGIC HISTORY:  No LMP recorded. Patient has had a hysterectomy. Menarche: 64 years old Age at first live birth: 64 years old GX P 4 HRT no  Hysterectomy? Yes, 10/1996, due to fibroids BSO? 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  Her husband Frances Hughes is retired from [employer].  They have twin daughters, Frances Hughes who works as an Web designer in Child psychotherapist who works in [place] for the [employer] and [employer], both 66 years old; Frances Hughes who works in Psychologist, educational, age 92; and then their son Frances Hughes died from "colloid cysts in the brain" at the age of 85.  The patient has 6 grandchildren.  She is a Psychologist, forensic    ADVANCED DIRECTIVES: In the absence of any documents to the contrary the patient's husband is her healthcare power of attorney.   HEALTH MAINTENANCE: Social History   Tobacco Use  . Smoking status: Former Smoker    Packs/day: 0.75    Years: 10.00    Pack years: 7.50    Types: Cigarettes    Start date: 01/30/1969    Quit date: 05/23/1988    Years since quitting: 31.7  . Smokeless tobacco: Never Used   Substance Use Topics  . Alcohol use: No    Alcohol/week: 0.0 standard drinks  . Drug use: No     Colonoscopy: 03/2018 (Dr. Gala Romney), repeat recommended 2022  PAP: 01/2012, negative  Bone density: none on file   No Known Allergies  Current Outpatient Medications  Medication Sig Dispense Refill  . amLODipine (NORVASC) 10 MG tablet TAKE 1 TABLET BY MOUTH ONCE DAILY. 90 tablet 3  . aspirin 81 MG tablet Take 1 tablet (81 mg total) by mouth daily. 30 tablet 1  . atorvastatin (LIPITOR) 40 MG tablet Take 1 tablet (40 mg total) by mouth daily. 90 tablet 1  . Blood Glucose Monitoring Suppl (BLOOD GLUCOSE MONITOR SYSTEM) W/DEVICE KIT Please dispense based on patient and insurance preference. Use to monitor fasting blood sugar 1x daily. Dx: E11.9 1 each 0  . carvedilol (COREG) 25 MG tablet Take 1 tablet (25 mg total) by mouth 2 (two) times daily. 180 tablet 3  . cloNIDine (CATAPRES) 0.1 MG tablet Take 1 tablet (0.1 mg total) by mouth at bedtime. 30 tablet 3  . escitalopram (LEXAPRO) 10 MG tablet Take 1 tablet (10 mg total) by mouth at bedtime. 30 tablet 2  . furosemide (LASIX)  20 MG tablet TAKE (1) TABLET BY MOUTH ONCE DAILY AS NEEDED. 90 tablet 3  . gabapentin (NEURONTIN) 300 MG capsule Take 300 mg by mouth 3 (three) times daily.    . Glucose Blood (BLOOD GLUCOSE TEST STRIPS) STRP Please dispense based on patient and insurance preference. Use to monitor fasting blood sugar 1x daily. Dx: E11.9 100 each 0  . Lancets 28G MISC Please dispense based on patient and insurance preference. Use to monitor fasting blood sugar 1x daily. Dx: E11.9 100 each 0  . levothyroxine (SYNTHROID) 200 MCG tablet TAKE 1 TABLET BY MOUTH EVERY DAY BEFORE BREAKFAST 30 tablet 3  . losartan (COZAAR) 100 MG tablet Take 1 tablet (100 mg total) by mouth daily. 90 tablet 3  . metFORMIN (GLUCOPHAGE) 500 MG tablet TAKE 1 TABLET BY MOUTH TWICE DAILY WITH A MEAL 180 tablet 1  . Multiple Vitamins-Minerals (CENTRUM SILVER PO) Take 1 tablet  by mouth daily.     . naproxen (NAPROSYN) 500 MG tablet TAKE 1 TABLET(500 MG) BY MOUTH TWICE DAILY WITH A MEAL 60 tablet 1  . potassium chloride SA (K-DUR,KLOR-CON) 20 MEQ tablet Take 1 tablet (20 mEq total) by mouth daily. 90 tablet 3  . traMADol (ULTRAM) 50 MG tablet Take 1 tablet (50 mg total) by mouth every 12 (twelve) hours as needed. 30 tablet 0  . traMADol (ULTRAM) 50 MG tablet Take 1 tablet (50 mg total) by mouth every 6 (six) hours as needed for moderate pain or severe pain. 25 tablet 0   Current Facility-Administered Medications  Medication Dose Route Frequency Provider Last Rate Last Admin  . furosemide (LASIX) tablet 20 mg  20 mg Oral Once Frances Hughes, Frances Dad, MD        OBJECTIVE: African-American woman in no acute distress  Vitals:   03/02/20 1557  BP: (!) 209/124  Pulse: 65  Resp: 16  Temp: 97.6 F (36.4 C)  SpO2: 92%     Body mass index is 45.92 kg/m.   Wt Readings from Last 3 Encounters:  03/02/20 259 lb 3.2 oz (117.6 kg)  02/19/20 261 lb 14.5 oz (118.8 kg)  01/17/20 259 lb (117.5 kg)   Repeat blood pressure after 20 mg of Lasix was 240/115.    ECOG FS:2 - Symptomatic, <50% confined to bed  Ocular: Sclerae unicteric Ear-nose-throat: Wearing a mask Lymphatic: No cervical or supraclavicular adenopathy Lungs no rales or rhonchi Heart regular rate and rhythm Abd soft, nontender, positive bowel sounds MSK no focal spinal tenderness, no joint edema Neuro: non-focal, well-oriented, appropriate affect Breasts: The right breast is unremarkable.  The left breast is status post recent lumpectomy.  The incision is healing very nicely without any swelling erythema or dehiscence.  Both axillae are benign.   LAB RESULTS:  CMP     Component Value Date/Time   NA 142 03/02/2020 1526   K 4.2 03/02/2020 1526   CL 104 03/02/2020 1526   CO2 33 (H) 03/02/2020 1526   GLUCOSE 99 03/02/2020 1526   BUN 18 03/02/2020 1526   CREATININE 0.74 03/02/2020 1526   CREATININE 0.88  12/18/2019 0847   CALCIUM 9.4 03/02/2020 1526   PROT 7.6 03/02/2020 1526   ALBUMIN 3.3 (L) 03/02/2020 1526   AST 17 03/02/2020 1526   ALT 13 03/02/2020 1526   ALKPHOS 78 03/02/2020 1526   BILITOT 0.3 03/02/2020 1526   GFRNONAA >60 03/02/2020 1526   GFRNONAA 94 11/07/2018 0927   GFRAA >60 02/17/2020 0930   GFRAA 109 11/07/2018 9798  No results found for: TOTALPROTELP, ALBUMINELP, A1GS, A2GS, BETS, BETA2SER, GAMS, MSPIKE, SPEI  Lab Results  Component Value Date   WBC 7.4 12/18/2019   NEUTROABS 4,470 12/18/2019   HGB 12.3 12/18/2019   HCT 40.0 12/18/2019   MCV 82.1 12/18/2019   PLT 273 12/18/2019    No results found for: LABCA2  No components found for: [id number]  No results for input(s): INR in the last 168 hours.  No results found for: LABCA2  No results found for: DGU440  No results found for: HKV425  No results found for: ZDG387  No results found for: CA2729  No components found for: HGQUANT  No results found for: CEA1 / No results found for: CEA1   No results found for: AFPTUMOR  No results found for: CHROMOGRNA  No results found for: KPAFRELGTCHN, LAMBDASER, KAPLAMBRATIO (kappa/lambda light chains)  No results found for: HGBA, HGBA2QUANT, HGBFQUANT, HGBSQUAN (Hemoglobinopathy evaluation)   No results found for: LDH  No results found for: IRON, TIBC, IRONPCTSAT (Iron and TIBC)  No results found for: FERRITIN  Urinalysis    Component Value Date/Time   COLORURINE YELLOW 07/17/2015 [place] 07/17/2015 1234   LABSPEC 1.020 07/17/2015 1234   PHURINE 6.5 07/17/2015 1234   GLUCOSEU NEGATIVE 07/17/2015 1234   HGBUR 2+ (A) 07/17/2015 1234   HGBUR trace-intact 06/13/2008 [place] 07/17/2015 1234   KETONESUR NEGATIVE 07/17/2015 1234   PROTEINUR NEGATIVE 07/17/2015 1234   UROBILINOGEN 0.2 06/13/2008 1045   NITRITE NEGATIVE 07/17/2015 [place] 07/17/2015 1234     STUDIES: MM Breast  Surgical Specimen  Addendum Date: 02/20/2020   ADDENDUM REPORT: 02/20/2020 10:34 ADDENDUM: In the Findings section, I inadvertently stated that the coil shaped tissue marker clip is in the specimen. Only the radioactive seed is in the specimen, and the seed is intact. Electronically Signed   By: Evangeline Dakin M.D.   On: 02/20/2020 10:34   Result Date: 02/20/2020 CLINICAL DATA:  Biopsy-proven DCIS involving the UPPER OUTER QUADRANT of the LEFT breast for which lumpectomy is performed. Radioactive seed localization was performed yesterday. EXAM: SPECIMEN RADIOGRAPH OF THE LEFT BREAST COMPARISON:  Previous exam(s). FINDINGS: Status post excision of the LEFT breast. The radioactive seed is present in a non-compressed specimen and is intact. The coil shaped tissue marker clip is in a second non-compressed specimen. This was discussed by telephone with the operating room nurse at the time of interpretation on 02/19/2020 at 2:07 p.m. IMPRESSION: Specimen radiograph of the LEFT breast. Electronically Signed: By: Evangeline Dakin M.D. On: 02/19/2020 14:12   MM LT RADIOACTIVE SEED LOC MAMMO GUIDE  Result Date: 02/18/2020 CLINICAL DATA:  64 year old with biopsy-proven DCIS involving the UPPER OUTER QUADRANT of the LEFT breast. Radioactive seed localization is performed in anticipation of lumpectomy which is scheduled for tomorrow. Of note, the coil shaped tissue marker clip placed at the time of stereotactic biopsy migrated approximately 4 cm MEDIAL to the calcifications. Therefore, the calcifications are localized. EXAM: MAMMOGRAPHIC GUIDED RADIOACTIVE SEED LOCALIZATION OF THE LEFT BREAST COMPARISON:  Previous exam(s). FINDINGS: Patient presents for radioactive seed localization prior to LEFT breast lumpectomy. I met with the patient and we discussed the procedure of seed localization including benefits and alternatives. We discussed the high likelihood of a successful procedure. We discussed the risks of the  procedure including infection, bleeding, tissue injury and further surgery. We discussed the low dose of radioactivity involved in the procedure. Informed, written consent was given.  The usual time-out protocol was performed immediately prior to the procedure. Using mammographic guidance, sterile technique with chlorhexidine as skin antisepsis, 1% lidocaine as local anesthetic, an I-125 radioactive seed was used to localize the residual calcifications in the UPPER OUTER QUADRANT of the LEFT breast at posterior depth using a superior approach, after we were unable to approach the calcifications laterally. The follow-up mammogram images confirm that the seed is appropriately position adjacent to the residual calcifications. The images are marked for Dr. Ninfa Linden. Follow-up survey of the patient confirms the presence of the radioactive seed. Order number of I-125 seed: [id number] Total activity: 0.254 mCi Reference Date: 12/23/2019 The patient tolerated the procedure well and was released from the [facility]. She was given instructions regarding seed removal. 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 No apparent complications. Electronically Signed   By: Evangeline Dakin M.D.   On: 02/18/2020 15:47     ELIGIBLE FOR AVAILABLE RESEARCH PROTOCOL: Opted against COMET  ASSESSMENT: 64 y.o. [place], [place] woman status post left breast biopsy 01/06/2020 for ductal carcinoma in situ, grade 1, estrogen and progesterone receptor positive  (1) status post left lumpectomy 02/19/2020 for ductal carcinoma in situ grade 1 or 2, 1.5 cm, with negative margins.  (2) adjuvant radiation to follow  (3) consider antiestrogens  (4)  PLAN: I met today with Frances Hughes to review her new diagnosis. Specifically we discussed the biology of her breast cancer, its diagnosis, staging, treatment  options and prognosis. Lyrical  understands that in noninvasive ductal carcinoma, also called ductal carcinoma in situ ("DCIS") the breast cancer cells remain trapped in the ducts were they started. They cannot travel to a vital organ. For that reason these cancers in themselves are not life-threatening.  If the whole breast is removed then all the ducts are removed and since the cancer cells are trapped in the ducts, the cure rate with mastectomy for noninvasive breast cancer is approximately 99%. Nevertheless we recommended lumpectomy, because there is no survival advantage to mastectomy and because the cosmetic result is generally superior with breast conservation.  Since the patient decided to keep her breast, there will be some risk of recurrence. The recurrence can only be in the same breast since, again, the cells are trapped in the ducts. There is no connection from one breast to the other. The risk of local recurrence is cut by more than half with radiation, which is standard in this situation.  In estrogen receptor positive cancers like Anandi's, anti-estrogens can also be considered. They will further reduce the risk of recurrence by one half. In addition anti-estrogens will lower the risk of a new breast cancer developing in either breast, also by one half. That risk approaches 1% per year.   Accordingly the overall plan is for surgery, followed by radiation, then a discussion of anti-estrogens.  Eriel's blood pressure was very high today.  I did everything I could to calm her down even though she did not seem particularly anxious.  We also gave her 20 mg of Lasix with no obvious effect after 30 minutes.  I directed her to the ED for further evaluation before she went home.  Finally [name] does meet criteria for genetics testing In patients who carry a deleterious mutation [for example in a  BRCA gene], the risk of a new breast cancer developing in the future may be sufficiently great that the patient may choose bilateral  mastectomies.  However if she wishes to keep her breasts in that situation it is safe to do so. That would require intensified screening, which generally means not only yearly mammography but a yearly breast MRI as well.   Shevon has a good understanding of the overall plan. She agrees with it. She knows the goal of treatment in her case is cure. She will call with any problems that may develop before her next visit here.  Total encounter time 55 minutes.Frances Jews C. Nathanal Hermiz, MD 03/02/2020 4:43 PM Medical Oncology and Hematology [facility] [place], [place] [address] Tel. [phone]    Fax. [phone]   This document serves as a record of services personally performed by Lurline Del, MD. It was created on his behalf by Wilburn Mylar, a trained medical scribe. The creation of this record is based on the scribe's personal observations and the provider's statements to them.   I, Lurline Del MD, have reviewed the above documentation for accuracy and completeness, and I agree with the above.    *Total Encounter Time as defined by the Centers for Medicare and Medicaid Services includes, in addition to the face-to-face time of a patient visit (documented in the note above) non-face-to-face time: obtaining and reviewing outside history, ordering and reviewing medications, tests or procedures, care coordination (communications with other health care professionals or caregivers) and documentation in the medical record.

## 2020-03-02 ENCOUNTER — Encounter (HOSPITAL_COMMUNITY): Payer: Self-pay

## 2020-03-02 ENCOUNTER — Inpatient Hospital Stay (HOSPITAL_COMMUNITY)
Admission: EM | Admit: 2020-03-02 | Discharge: 2020-03-05 | DRG: 305 | Disposition: A | Discharge status: Home / Self Care | Payer: Medicare Other | Source: Ambulatory Visit | Attending: Internal Medicine | Admitting: Internal Medicine

## 2020-03-02 ENCOUNTER — Inpatient Hospital Stay: Payer: Medicare Other | Attending: Oncology | Admitting: Oncology

## 2020-03-02 ENCOUNTER — Other Ambulatory Visit: Payer: Medicare Other

## 2020-03-02 ENCOUNTER — Other Ambulatory Visit: Payer: Self-pay

## 2020-03-02 ENCOUNTER — Inpatient Hospital Stay: Payer: Medicare Other

## 2020-03-02 VITALS — BP 209/124 | HR 65 | Temp 97.6°F | Resp 16 | Ht 63.0 in | Wt 259.2 lb

## 2020-03-02 DIAGNOSIS — Z6841 Body Mass Index (BMI) 40.0 and over, adult: Secondary | ICD-10-CM

## 2020-03-02 DIAGNOSIS — Z87891 Personal history of nicotine dependence: Secondary | ICD-10-CM

## 2020-03-02 DIAGNOSIS — I77810 Thoracic aortic ectasia: Secondary | ICD-10-CM | POA: Diagnosis present

## 2020-03-02 DIAGNOSIS — C50912 Malignant neoplasm of unspecified site of left female breast: Secondary | ICD-10-CM

## 2020-03-02 DIAGNOSIS — E119 Type 2 diabetes mellitus without complications: Secondary | ICD-10-CM | POA: Diagnosis not present

## 2020-03-02 DIAGNOSIS — D0512 Intraductal carcinoma in situ of left breast: Secondary | ICD-10-CM | POA: Diagnosis not present

## 2020-03-02 DIAGNOSIS — E876 Hypokalemia: Secondary | ICD-10-CM | POA: Diagnosis not present

## 2020-03-02 DIAGNOSIS — E278 Other specified disorders of adrenal gland: Secondary | ICD-10-CM | POA: Diagnosis not present

## 2020-03-02 DIAGNOSIS — Z79890 Hormone replacement therapy (postmenopausal): Secondary | ICD-10-CM | POA: Diagnosis not present

## 2020-03-02 DIAGNOSIS — E0500 Thyrotoxicosis with diffuse goiter without thyrotoxic crisis or storm: Secondary | ICD-10-CM | POA: Diagnosis not present

## 2020-03-02 DIAGNOSIS — Z90710 Acquired absence of both cervix and uterus: Secondary | ICD-10-CM

## 2020-03-02 DIAGNOSIS — Z800 Family history of malignant neoplasm of digestive organs: Secondary | ICD-10-CM

## 2020-03-02 DIAGNOSIS — E1149 Type 2 diabetes mellitus with other diabetic neurological complication: Secondary | ICD-10-CM | POA: Diagnosis not present

## 2020-03-02 DIAGNOSIS — E782 Mixed hyperlipidemia: Secondary | ICD-10-CM | POA: Diagnosis present

## 2020-03-02 DIAGNOSIS — E1169 Type 2 diabetes mellitus with other specified complication: Secondary | ICD-10-CM

## 2020-03-02 DIAGNOSIS — Z170 Estrogen receptor positive status [ER+]: Secondary | ICD-10-CM | POA: Diagnosis not present

## 2020-03-02 DIAGNOSIS — E038 Other specified hypothyroidism: Secondary | ICD-10-CM | POA: Diagnosis not present

## 2020-03-02 DIAGNOSIS — E785 Hyperlipidemia, unspecified: Secondary | ICD-10-CM | POA: Diagnosis not present

## 2020-03-02 DIAGNOSIS — Z79899 Other long term (current) drug therapy: Secondary | ICD-10-CM

## 2020-03-02 DIAGNOSIS — E039 Hypothyroidism, unspecified: Secondary | ICD-10-CM | POA: Diagnosis not present

## 2020-03-02 DIAGNOSIS — Z87442 Personal history of urinary calculi: Secondary | ICD-10-CM

## 2020-03-02 DIAGNOSIS — Z801 Family history of malignant neoplasm of trachea, bronchus and lung: Secondary | ICD-10-CM

## 2020-03-02 DIAGNOSIS — G4733 Obstructive sleep apnea (adult) (pediatric): Secondary | ICD-10-CM | POA: Diagnosis present

## 2020-03-02 DIAGNOSIS — Z808 Family history of malignant neoplasm of other organs or systems: Secondary | ICD-10-CM

## 2020-03-02 DIAGNOSIS — D3502 Benign neoplasm of left adrenal gland: Secondary | ICD-10-CM | POA: Diagnosis present

## 2020-03-02 DIAGNOSIS — I160 Hypertensive urgency: Secondary | ICD-10-CM | POA: Diagnosis present

## 2020-03-02 DIAGNOSIS — I10 Essential (primary) hypertension: Secondary | ICD-10-CM | POA: Diagnosis present

## 2020-03-02 DIAGNOSIS — Z23 Encounter for immunization: Secondary | ICD-10-CM | POA: Diagnosis not present

## 2020-03-02 DIAGNOSIS — Z7984 Long term (current) use of oral hypoglycemic drugs: Secondary | ICD-10-CM

## 2020-03-02 DIAGNOSIS — K219 Gastro-esophageal reflux disease without esophagitis: Secondary | ICD-10-CM | POA: Diagnosis present

## 2020-03-02 DIAGNOSIS — Z20822 Contact with and (suspected) exposure to covid-19: Secondary | ICD-10-CM | POA: Diagnosis present

## 2020-03-02 DIAGNOSIS — I358 Other nonrheumatic aortic valve disorders: Secondary | ICD-10-CM | POA: Diagnosis present

## 2020-03-02 LAB — RESPIRATORY PANEL BY RT PCR (FLU A&B, COVID)
Influenza A by PCR: NEGATIVE
Influenza B by PCR: NEGATIVE
SARS Coronavirus 2 by RT PCR: NEGATIVE

## 2020-03-02 LAB — CMP (CANCER CENTER ONLY)
ALT: 13 U/L (ref 0–44)
AST: 17 U/L (ref 15–41)
Albumin: 3.3 g/dL — ABNORMAL LOW (ref 3.5–5.0)
Alkaline Phosphatase: 78 U/L (ref 38–126)
Anion gap: 5 (ref 5–15)
BUN: 18 mg/dL (ref 8–23)
CO2: 33 mmol/L — ABNORMAL HIGH (ref 22–32)
Calcium: 9.4 mg/dL (ref 8.9–10.3)
Chloride: 104 mmol/L (ref 98–111)
Creatinine: 0.74 mg/dL (ref 0.44–1.00)
GFR, Estimated: >60 mL/min (ref >60)
Glucose, Bld: 99 mg/dL (ref 70–99)
Potassium: 4.2 mmol/L (ref 3.5–5.1)
Sodium: 142 mmol/L (ref 135–145)
Total Bilirubin: 0.3 mg/dL (ref 0.3–1.2)
Total Protein: 7.6 g/dL (ref 6.5–8.1)

## 2020-03-02 LAB — CBC WITH DIFFERENTIAL/PLATELET
Abs Immature Granulocytes: 0.02 10*3/uL (ref 0.00–0.07)
Basophils Absolute: 0 10*3/uL (ref 0.0–0.1)
Basophils Relative: 0 %
Eosinophils Absolute: 0.2 10*3/uL (ref 0.0–0.5)
Eosinophils Relative: 2 %
HCT: 43.2 % (ref 36.0–46.0)
Hemoglobin: 13.1 g/dL (ref 12.0–15.0)
Immature Granulocytes: 0 %
Lymphocytes Relative: 34 %
Lymphs Abs: 2.9 10*3/uL (ref 0.7–4.0)
MCH: 25.6 pg — ABNORMAL LOW (ref 26.0–34.0)
MCHC: 30.3 g/dL (ref 30.0–36.0)
MCV: 84.5 fL (ref 80.0–100.0)
Monocytes Absolute: 0.6 10*3/uL (ref 0.1–1.0)
Monocytes Relative: 7 %
Neutro Abs: 4.9 10*3/uL (ref 1.7–7.7)
Neutrophils Relative %: 57 %
Platelets: 274 10*3/uL (ref 150–400)
RBC: 5.11 MIL/uL (ref 3.87–5.11)
RDW: 16.3 % — ABNORMAL HIGH (ref 11.5–15.5)
WBC: 8.5 10*3/uL (ref 4.0–10.5)
nRBC: 0 % (ref 0.0–0.2)

## 2020-03-02 LAB — BASIC METABOLIC PANEL
Anion gap: 10 (ref 5–15)
BUN: 17 mg/dL (ref 8–23)
CO2: 32 mmol/L (ref 22–32)
Calcium: 9 mg/dL (ref 8.9–10.3)
Chloride: 100 mmol/L (ref 98–111)
Creatinine, Ser: 0.57 mg/dL (ref 0.44–1.00)
GFR, Estimated: >60 mL/min (ref >60)
Glucose, Bld: 119 mg/dL — ABNORMAL HIGH (ref 70–99)
Potassium: 3.7 mmol/L (ref 3.5–5.1)
Sodium: 142 mmol/L (ref 135–145)

## 2020-03-02 MED ORDER — NITROGLYCERIN IN D5W 200-5 MCG/ML-% IV SOLN
0.0000 ug/min | INTRAVENOUS | Status: DC
  Administered 2020-03-02 – 2020-03-04 (×2): 5 ug/min via INTRAVENOUS
  Filled 2020-03-02 (×2): qty 250

## 2020-03-02 MED ORDER — FUROSEMIDE 20 MG PO TABS
ORAL_TABLET | ORAL | Status: AC
  Filled 2020-03-02: qty 1

## 2020-03-02 MED ORDER — LABETALOL HCL 5 MG/ML IV SOLN
20.0000 mg | INTRAVENOUS | Status: AC | PRN
  Administered 2020-03-02 (×5): 20 mg via INTRAVENOUS
  Filled 2020-03-02 (×3): qty 4

## 2020-03-02 MED ORDER — FUROSEMIDE 20 MG PO TABS: 20.0000 mg | ORAL_TABLET | Freq: Once | ORAL | Status: DC

## 2020-03-02 NOTE — ED Notes (Signed)
Dr. Eulis Foster aware pts BP continues to be high, currently 214/125. Per provider, continue with ordered meds.

## 2020-03-02 NOTE — ED Triage Notes (Signed)
Pt sent over from cancer center for high BP  BP-230/107  A/Ox4 Ambulatory in triage

## 2020-03-02 NOTE — ED Provider Notes (Signed)
[facility] DEPT Provider Note   CSN: [id number] Arrival date & time: 03/02/20  1653     History Chief Complaint  Patient presents with  . Hypertension    Frances Hughes is a 64 y.o. female.  HPI She went to her oncology appointment today, and with evaluated plans were made for ongoing care for her left breast cancer.  It was noted that her blood pressure was elevated and she was sent here for evaluation.  She states that she is taking her usual medications.  She denies headache, blurred vision, weakness, paresthesia, nausea or vomiting.  She states she is taking her usual medications, as directed.  There are no other known modifying factors.    Past Medical History:  Diagnosis Date  . Adrenal mass, left ([place])   . DM (diabetes mellitus) ([place])    Type II controlled  . Enlarged heart   . Grave's disease   . HTN (hypertension)   . Hyperlipidemia   . Lymphocytosis   . Nephrolithiasis   . OSA on CPAP     Patient Active Problem List   Diagnosis Date Noted  . 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 Encounter for screening colonoscopy 03/14/2018  . GERD (gastroesophageal reflux disease) 07/17/2015  . Lumbago 12/30/2014  . Routine general medical examination at a health care facility 12/06/2013  . Hoarse voice quality 12/06/2013  . Diastolic dysfunction 66/10/3014  . SOB (shortness of breath) 11/08/2013  . Systolic murmur 05/31/3233  . Urinary incontinence 09/16/2012  . Leg edema 04/05/2012  . Localized swelling, mass and lump, neck 07/15/2011  . Depression 07/15/2011  . Obesity 07/15/2011  . Scar condition and fibrosis of skin 03/13/2011  . Hypothyroidism 04/05/2006  . Diabetes mellitus, type II ([place]) 04/05/2006  . Adrenal mass, left ([place]) 04/05/2006  . Hyperlipidemia 04/05/2006  . OSA (obstructive sleep apnea) 04/05/2006  . Essential hypertension 04/05/2006    Past Surgical History:  Procedure Laterality Date   . ABDOMINAL HYSTERECTOMY    . BREAST BIOPSY Left 01/06/2020  . BREAST LUMPECTOMY WITH RADIOACTIVE SEED LOCALIZATION Left 02/19/2020   Procedure: LEFT BREAST LUMPECTOMY WITH RADIOACTIVE SEED LOCALIZATION;  Surgeon: [name], MD;  Location: [facility];  Service: General;  Laterality: Left;  . COLONOSCOPY  2011   normal colonoscopy  . COLONOSCOPY WITH PROPOFOL N/A 04/16/2018   Procedure: COLONOSCOPY WITH PROPOFOL;  Surgeon: Daneil Dolin, MD;  Location: [facility];  Service: Endoscopy;  Laterality: N/A;  9:45am  . HERNIA REPAIR  2011  . LITHOTRIPSY    . POLYPECTOMY  04/16/2018   Procedure: POLYPECTOMY;  Surgeon: Daneil Dolin, MD;  Location: [facility];  Service: Endoscopy;;  (colon)  . S/P Hysterectomy  10/1996   with bilat SOO seconardt to fibroids  . thyroid ablation  1986     OB History   No obstetric history on file.     Family History  Problem Relation Age of Onset  . Colon cancer Mother   . Colon cancer Sister   . Liver cancer Sister   . Cancer Sister        Colon cancer    Social History   Tobacco Use  . Smoking status: Former Smoker    Packs/day: 0.75    Years: 10.00    Pack years: 7.50    Types: Cigarettes    Start date: 01/30/1969    Quit date: 05/23/1988    Years since quitting: 31.7  .  Smokeless tobacco: Never Used  Substance Use Topics  . Alcohol use: No    Alcohol/week: 0.0 standard drinks  . Drug use: No    Home Medications Prior to Admission medications   Medication Sig Start Date End Date Taking? Authorizing Provider  amLODipine (NORVASC) 10 MG tablet TAKE 1 TABLET BY MOUTH ONCE DAILY. 07/19/18   [place], Kawanta F, MD  aspirin 81 MG tablet Take 1 tablet (81 mg total) by mouth daily. 11/22/12   [place], Kawanta F, MD  atorvastatin (LIPITOR) 40 MG tablet Take 1 tablet (40 mg total) by mouth daily. 09/11/19   [place], Kawanta F, MD  Blood Glucose Monitoring Suppl (BLOOD GLUCOSE MONITOR SYSTEM) W/DEVICE KIT Please dispense  based on patient and insurance preference. Use to monitor fasting blood sugar 1x daily. Dx: E11.9 10/09/14   [place], Kawanta F, MD  carvedilol (COREG) 25 MG tablet Take 1 tablet (25 mg total) by mouth 2 (two) times daily. 12/15/17   [place], Kawanta F, MD  cloNIDine (CATAPRES) 0.1 MG tablet Take 1 tablet (0.1 mg total) by mouth at bedtime. 11/11/19   [place], Kawanta F, MD  escitalopram (LEXAPRO) 10 MG tablet Take 1 tablet (10 mg total) by mouth at bedtime. 09/09/19   [place], Kawanta F, MD  furosemide (LASIX) 20 MG tablet TAKE (1) TABLET BY MOUTH ONCE DAILY AS NEEDED. 07/19/18   [place], Kawanta F, MD  gabapentin (NEURONTIN) 300 MG capsule Take 300 mg by mouth 3 (three) times daily.    [provider]  Glucose Blood (BLOOD GLUCOSE TEST STRIPS) STRP Please dispense based on patient and insurance preference. Use to monitor fasting blood sugar 1x daily. Dx: E11.9 01/29/16   [place], Kawanta F, MD  Lancets 28G MISC Please dispense based on patient and insurance preference. Use to monitor fasting blood sugar 1x daily. Dx: E11.9 10/09/14   [place], Kawanta F, MD  levothyroxine (SYNTHROID) 200 MCG tablet TAKE 1 TABLET BY MOUTH EVERY DAY BEFORE BREAKFAST 01/14/20   [place], Kawanta F, MD  losartan (COZAAR) 100 MG tablet Take 1 tablet (100 mg total) by mouth daily. 11/18/19   [place], Kawanta F, MD  metFORMIN (GLUCOPHAGE) 500 MG tablet TAKE 1 TABLET BY MOUTH TWICE DAILY WITH A MEAL 02/13/20   [place], Kawanta F, MD  Multiple Vitamins-Minerals (CENTRUM SILVER PO) Take 1 tablet by mouth daily.     [provider]  naproxen (NAPROSYN) 500 MG tablet TAKE 1 TABLET(500 MG) BY MOUTH TWICE DAILY WITH A MEAL 02/13/20   [place], Kawanta F, MD  potassium chloride SA (K-DUR,KLOR-CON) 20 MEQ tablet Take 1 tablet (20 mEq total) by mouth daily. 11/17/17   [place], Kawanta F, MD  traMADol (ULTRAM) 50 MG tablet Take 1 tablet (50 mg total) by mouth every 12 (twelve) hours as needed. 01/21/20   , Kawanta F, MD  traMADol (ULTRAM)  50 MG tablet Take 1 tablet (50 mg total) by mouth every 6 (six) hours as needed for moderate pain or severe pain. 02/19/20   [place], Douglas, MD    Allergies    Patient has no known allergies.  Review of Systems   Review of Systems  All other systems reviewed and are negative.   Physical Exam Updated Vital Signs BP (!) 170/97   Pulse (!) 55   Temp 98.1 F (36.7 C) (Oral)   Resp 18   Ht 5' 3" (1.6 m)   Wt 107.5 kg   SpO2 96%   BMI 41.98 kg/m   Physical Exam Vitals and nursing note   reviewed.  Constitutional:      General: She is not in acute distress.    Appearance: She is well-developed. She is obese. She is not ill-appearing, toxic-appearing or diaphoretic.  HENT:     Head: Normocephalic and atraumatic.     Right Ear: External ear normal.     Left Ear: External ear normal.     Mouth/Throat:     Pharynx: No oropharyngeal exudate or posterior oropharyngeal erythema.  Eyes:     Conjunctiva/sclera: Conjunctivae normal.     Pupils: Pupils are equal, round, and reactive to light.  Neck:     Trachea: Phonation normal.  Cardiovascular:     Rate and Rhythm: Normal rate and regular rhythm.     Heart sounds: Normal heart sounds.  Pulmonary:     Effort: Pulmonary effort is normal.     Breath sounds: Normal breath sounds.  Abdominal:     Palpations: Abdomen is soft.     Tenderness: There is no abdominal tenderness.  Musculoskeletal:        General: Normal range of motion.     Cervical back: Normal range of motion and neck supple.  Skin:    General: Skin is warm and dry.  Neurological:     Mental Status: She is alert and oriented to person, place, and time.     Cranial Nerves: No cranial nerve deficit.     Sensory: No sensory deficit.     Motor: No abnormal muscle tone.     Coordination: Coordination normal.     Comments: No dysarthria or aphasia or nystagmus.  No ataxia.  Psychiatric:        Mood and Affect: Mood normal.        Behavior: Behavior normal.         Thought Content: Thought content normal.        Judgment: Judgment normal.     ED Results / Procedures / Treatments   Labs (all labs ordered are listed, but only abnormal results are displayed) Labs Reviewed  BASIC METABOLIC PANEL - Abnormal; Notable for the following components:      Result Value   Glucose, Bld 119 (*)    All other components within normal limits  CBC WITH DIFFERENTIAL/PLATELET - Abnormal; Notable for the following components:   MCH 25.6 (*)    RDW 16.3 (*)    All other components within normal limits  RESPIRATORY PANEL BY RT PCR (FLU A&B, COVID)    EKG None  Radiology No results found.  Procedures .Critical Care Performed by: Wentz, Elliott, MD Authorized by: Wentz, Elliott, MD   Critical care provider statement:    Critical care time (minutes):  35   Critical care start time:  03/02/2020 6:35 PM   Critical care end time:  03/02/2020 11:08 PM   Critical care time was exclusive of:  Separately billable procedures and treating other patients   Critical care was necessary to treat or prevent imminent or life-threatening deterioration of the following conditions:  Circulatory failure   Critical care was time spent personally by me on the following activities:  Blood draw for specimens, development of treatment plan with patient or surrogate, discussions with consultants, evaluation of patient's response to treatment, examination of patient, obtaining history from patient or surrogate, ordering and performing treatments and interventions, ordering and review of laboratory studies, pulse oximetry, re-evaluation of patient's condition, review of old charts and ordering and review of radiographic studies   (including critical care time)  Medications Ordered   in ED Medications  nitroGLYCERIN 50 mg in dextrose 5 % 250 mL (0.2 mg/mL) infusion (10 mcg/min Intravenous Rate/Dose Change 03/02/20 2253)  labetalol (NORMODYNE) injection 20 mg (20 mg Intravenous Given  03/02/20 2115)    ED Course  I have reviewed the triage vital signs and the nursing notes.  Pertinent labs & imaging results that were available during my care of the patient were reviewed by me and considered in my medical decision making (see chart for details).    MDM Rules/Calculators/A&P                           Patient Vitals for the past 24 hrs:  BP Temp Temp src Pulse Resp SpO2 Height Weight  03/02/20 2300 (!) 170/97 -- -- (!) 55 18 96 % -- --  03/02/20 2255 (!) 197/113 -- -- (!) 57 (!) 25 97 % -- --  03/02/20 2250 (!) 191/104 -- -- (!) 56 (!) 25 96 % -- --  03/02/20 2245 (!) 201/97 -- -- -- (!) 21 -- -- --  03/02/20 2204 (!) 186/104 -- -- 70 20 99 % -- --  03/02/20 2200 (!) 186/104 -- -- -- -- -- -- --  03/02/20 2130 (!) 173/94 -- -- 65 20 99 % -- --  03/02/20 2119 (!) 179/86 -- -- 66 20 99 % -- --  03/02/20 2100 (!) 247/164 -- -- -- -- -- -- --  03/02/20 2049 (!) 192/123 -- -- 68 20 99 % -- --  03/02/20 2000 (!) 214/125 -- -- 70 20 99 % -- --  03/02/20 1930 (!) 197/98 -- -- -- -- -- -- --  03/02/20 1925 (!) 210/129 -- -- 72 20 99 % -- --  03/02/20 1900 (!) 215/124 -- -- 70 20 98 % -- --  03/02/20 1840 (!) 216/116 -- -- -- -- -- -- --  03/02/20 1823 (!) 264/152 -- -- 76 -- 96 % -- --  03/02/20 1820 (!) 258/156 -- -- 70 -- 96 % -- --  03/02/20 1806 (!) 228/142 -- -- 70 20 97 % -- --  03/02/20 1701 (!) 237/122 98.1 F (36.7 C) Oral 64 18 95 % 5' 3" (1.6 m) 107.5 kg    11:10 PM Reevaluation with update and discussion. After initial assessment and treatment, an updated evaluation reveals no further complaints, she is fairly comfortable.Daleen Bo   Medical Decision Making:  This patient is presenting for evaluation of high blood pressure, which does require a range of treatment options, and is a complaint that involves a moderate risk of morbidity and mortality. The differential diagnoses include metabolic instability, hypertensive urgency. I decided to review old  records, and in summary patient being treated for breast cancer found to have very high blood pressure incidentally. She is fairly asymptomatic for it. She is taking her usual medications, without relief.  I obtained additional historical information from her husband at the bedside.  Clinical Laboratory Tests Ordered, included CBC and Metabolic panel. Review indicates normal findings.   Critical Interventions-clinical evaluation, laboratory testing, treatment for high blood pressure with multiple doses of labetalol, observation reassessment  After These Interventions, the Patient was reevaluated and was found to initially have controlled blood pressure with labetalol boluses, then relapsed with very high blood pressures. Patient is likely at risk for severe complications from her high blood pressure, she will therefore be admitted and treated with infusion of nitroglycerin for persistently high blood pressure. Doubt acute  intracranial bleeding, or significant metabolic instability  CRITICAL CARE-yes Performed by: Daleen Bo  Nursing Notes Reviewed/ Care Coordinated Applicable Imaging Reviewed Interpretation of Laboratory Data incorporated into ED treatment  10:30 PM-Consult complete with hospitalist. Patient case explained and discussed. He agrees to admit patient for further evaluation and treatment. Call ended at 11:09 PM    Final Clinical Impression(s) / ED Diagnoses Final diagnoses:  Hypertensive urgency  Malignant neoplasm of left female breast, unspecified estrogen receptor status, unspecified site of breast [facility])    Rx / DC Orders ED Discharge Orders    None       Daleen Bo, MD 03/02/20 2345

## 2020-03-02 NOTE — ED Notes (Signed)
Dr. Eulis Foster aware pts BP now 179/86. Will continue to monitor per MD.

## 2020-03-02 NOTE — H&P (Signed)
History and Physical   Frances Hughes UOR:[id number] DOB: Feb 06, 1956 DOA: 03/02/2020  Referring MD/NP/PA: Dr. Eulis Foster  PCP: Alycia Rossetti, MD   Outpatient Specialists: Dr. Jana Hakim, oncology  Patient coming from: Home  Chief Complaint: Elevated blood pressure  HPI: Frances Hughes is a 64 y.o. female with medical history significant of breast cancer currently under oncology care, hypertension, hyperlipidemia, Graves' disease, diabetes, left adrenal mass, obstructive sleep apnea who was seen today by her oncologist for regular visit.  She is on multiple medications for blood pressure.  She was seen with elevated blood pressure systolic more than 276 and diastolic more than 147.  Patient denied any significant symptoms.  She has been undergoing treatment with recent lumpectomy on September 29 showing ductal carcinoma in situ.  She is being prepped for CT simulation on October 28 for possible radiation therapy.  Patient was sent over to the hospital due to hypertensive urgency.  She is still having systolic blood pressures above 092 and diastolic above 957.  No other significant complaints..  ED Course: Temperature is 98.1 blood pressure 264/152, pulse 76 respirate of 30 oxygen sat 90% room air.  CBC and chemistry entirely within normal.  COVID-19 screen is negative.  Glucose 119.  Patient therefore being admitted for hypertensive urgency.  Review of Systems: As per HPI otherwise 10 point review of systems negative.    Past Medical History:  Diagnosis Date  . Adrenal mass, left ([place])   . DM (diabetes mellitus) ([place])    Type II controlled  . Enlarged heart   . Grave's disease   . HTN (hypertension)   . Hyperlipidemia   . Lymphocytosis   . Nephrolithiasis   . OSA on CPAP     Past Surgical History:  Procedure Laterality Date  . ABDOMINAL HYSTERECTOMY    . BREAST BIOPSY Left 01/06/2020  . BREAST LUMPECTOMY WITH RADIOACTIVE SEED LOCALIZATION Left 02/19/2020   Procedure: LEFT  BREAST LUMPECTOMY WITH RADIOACTIVE SEED LOCALIZATION;  Surgeon: [name], MD;  Location: [facility];  Service: General;  Laterality: Left;  . COLONOSCOPY  2011   normal colonoscopy  . COLONOSCOPY WITH PROPOFOL N/A 04/16/2018   Procedure: COLONOSCOPY WITH PROPOFOL;  Surgeon: Daneil Dolin, MD;  [facility];  Service: Endoscopy;  Laterality: N/A;  9:45am  . HERNIA REPAIR  2011  . LITHOTRIPSY    . POLYPECTOMY  04/16/2018   Procedure: POLYPECTOMY;  Surgeon: Daneil Dolin, MD;  [facility];  Service: Endoscopy;;  (colon)  . S/P Hysterectomy  10/1996   with bilat SOO seconardt to fibroids  . thyroid ablation  1986     reports that she quit smoking about 31 years ago. Her smoking use included cigarettes. She started smoking about 51 years ago. She has a 7.50 pack-year smoking history. She has never used smokeless tobacco. She reports that she does not drink alcohol and does not use drugs.  No Known Allergies  Family History  Problem Relation Age of Onset  . Colon cancer Mother   . Colon cancer Sister   . Liver cancer Sister   . Cancer Sister        Colon cancer     Prior to Admission medications   Medication Sig Start Date End Date Taking? Authorizing Provider  acetaminophen (TYLENOL) 650 MG CR tablet Take 1,300 mg by mouth 2 (two) times daily as needed for pain.   Yes [provider]  carvedilol (COREG) 25 MG tablet Take  1 tablet (25 mg total) by mouth 2 (two) times daily. 12/15/17  Yes [place], Modena Nunnery, MD  cloNIDine (CATAPRES) 0.1 MG tablet Take 1 tablet (0.1 mg total) by mouth at bedtime. 11/11/19  Yes [place], Modena Nunnery, MD  furosemide (LASIX) 20 MG tablet TAKE (1) TABLET BY MOUTH ONCE DAILY AS NEEDED. Patient taking differently: Take 20 mg by mouth daily as needed for fluid.  07/19/18  Yes [place], Modena Nunnery, MD  gabapentin (NEURONTIN) 300 MG capsule Take 300 mg by mouth 3 (three) times daily as needed (pain).    Yes [provider]  levothyroxine (SYNTHROID) 200 MCG tablet TAKE 1 TABLET BY MOUTH EVERY DAY BEFORE BREAKFAST Patient taking differently: Take 200 mcg by mouth daily before breakfast.  01/14/20  Yes [place], Modena Nunnery, MD  losartan (COZAAR) 100 MG tablet Take 1 tablet (100 mg total) by mouth daily. 11/18/19  Yes [place], Modena Nunnery, MD  metFORMIN (GLUCOPHAGE) 500 MG tablet TAKE 1 TABLET BY MOUTH TWICE DAILY WITH A MEAL Patient taking differently: Take 500 mg by mouth 2 (two) times daily with a meal.  02/13/20  Yes [place], Modena Nunnery, MD  Multiple Vitamins-Minerals (CENTRUM SILVER PO) Take 1 tablet by mouth daily.    Yes [provider]  naproxen (NAPROSYN) 500 MG tablet TAKE 1 TABLET(500 MG) BY MOUTH TWICE DAILY WITH A MEAL Patient taking differently: Take 500 mg by mouth daily as needed for mild pain.  02/13/20  Yes [place], Modena Nunnery, MD  potassium chloride SA (K-DUR,KLOR-CON) 20 MEQ tablet Take 1 tablet (20 mEq total) by mouth daily. Patient taking differently: Take 20 mEq by mouth daily as needed (leg cramps).  11/17/17  Yes , Modena Nunnery, MD  traMADol (ULTRAM) 50 MG tablet Take 1 tablet (50 mg total) by mouth every 6 (six) hours as needed for moderate pain or severe pain. 02/19/20  Yes [name], MD  atorvastatin (LIPITOR) 40 MG tablet Take 1 tablet (40 mg total) by mouth daily. Patient not taking: Reported on 03/02/2020 09/11/19   Alycia Rossetti, MD  Blood Glucose Monitoring Suppl (BLOOD GLUCOSE MONITOR SYSTEM) W/DEVICE KIT Please dispense based on patient and insurance preference. Use to monitor fasting blood sugar 1x daily. Dx: E11.9 10/09/14   Alycia Rossetti, MD  escitalopram (LEXAPRO) 10 MG tablet Take 1 tablet (10 mg total) by mouth at bedtime. Patient not taking: Reported on 03/02/2020 09/09/19   Alycia Rossetti, MD  Glucose Blood (BLOOD GLUCOSE TEST STRIPS) STRP Please dispense based on patient and insurance preference. Use to monitor fasting blood sugar 1x daily. Dx: E11.9  01/29/16   Alycia Rossetti, MD  Lancets 28G MISC Please dispense based on patient and insurance preference. Use to monitor fasting blood sugar 1x daily. Dx: E11.9 10/09/14   Alycia Rossetti, MD  traMADol (ULTRAM) 50 MG tablet Take 1 tablet (50 mg total) by mouth every 12 (twelve) hours as needed. Patient not taking: Reported on 03/02/2020 01/21/20   Alycia Rossetti, MD    Physical Exam: Vitals:   03/02/20 2320 03/02/20 2325 03/02/20 2330 03/02/20 2335  BP: (!) 198/116 (!) 205/113 (!) 189/112 (!) 177/106  Pulse: (!) 59 62 63 (!) 54  Resp: 18 (!) 21 (!) 27 (!) 22  Temp:      TempSrc:      SpO2: 90% 96% 92% 93%  Weight:      Height:          Constitutional: NAD, calm, comfortable Vitals:  03/02/20 2320 03/02/20 2325 03/02/20 2330 03/02/20 2335  BP: (!) 198/116 (!) 205/113 (!) 189/112 (!) 177/106  Pulse: (!) 59 62 63 (!) 54  Resp: 18 (!) 21 (!) 27 (!) 22  Temp:      TempSrc:      SpO2: 90% 96% 92% 93%  Weight:      Height:       Eyes: PERRL, lids and conjunctivae normal ENMT: Mucous membranes are moist. Posterior pharynx clear of any exudate or lesions.Normal dentition.  Neck: normal, supple, no masses, no thyromegaly Respiratory: clear to auscultation bilaterally, no wheezing, no crackles. Normal respiratory effort. No accessory muscle use.  Cardiovascular: Regular rate and rhythm, no murmurs / rubs / gallops. No extremity edema. 2+ pedal pulses. No carotid bruits.  Abdomen: no tenderness, no masses palpated. No hepatosplenomegaly. Bowel sounds positive.  Musculoskeletal: no clubbing / cyanosis. No joint deformity upper and lower extremities. Good ROM, no contractures. Normal muscle tone.  Skin: no rashes, lesions, ulcers. No induration Neurologic: CN 2-12 grossly intact. Sensation intact, DTR normal. Strength 5/5 in all 4.  Psychiatric: Normal judgment and insight. Alert and oriented x 3. Normal mood.     Labs on Admission: I have personally reviewed following labs and  imaging studies  CBC: Recent Labs  Lab 03/02/20 1842  WBC 8.5  NEUTROABS 4.9  HGB 13.1  HCT 43.2  MCV 84.5  PLT 937   Basic Metabolic Panel: Recent Labs  Lab 03/02/20 1526 03/02/20 1842  NA 142 142  K 4.2 3.7  CL 104 100  CO2 33* 32  GLUCOSE 99 119*  BUN 18 17  CREATININE 0.74 0.57  CALCIUM 9.4 9.0   GFR: Estimated Creatinine Clearance: 81.2 mL/min (by C-G formula based on SCr of 0.57 mg/dL). Liver Function Tests: Recent Labs  Lab 03/02/20 1526  AST 17  ALT 13  ALKPHOS 78  BILITOT 0.3  PROT 7.6  ALBUMIN 3.3*   No results for input(s): LIPASE, AMYLASE in the last 168 hours. No results for input(s): AMMONIA in the last 168 hours. Coagulation Profile: No results for input(s): INR, PROTIME in the last 168 hours. Cardiac Enzymes: No results for input(s): CKTOTAL, CKMB, CKMBINDEX, TROPONINI in the last 168 hours. BNP (last 3 results) No results for input(s): PROBNP in the last 8760 hours. HbA1C: No results for input(s): HGBA1C in the last 72 hours. CBG: No results for input(s): GLUCAP in the last 168 hours. Lipid Profile: No results for input(s): CHOL, HDL, LDLCALC, TRIG, CHOLHDL, LDLDIRECT in the last 72 hours. Thyroid Function Tests: No results for input(s): TSH, T4TOTAL, FREET4, T3FREE, THYROIDAB in the last 72 hours. Anemia Panel: No results for input(s): VITAMINB12, FOLATE, FERRITIN, TIBC, IRON, RETICCTPCT in the last 72 hours. Urine analysis:    Component Value Date/Time   COLORURINE YELLOW 07/17/2015 [place] 07/17/2015 1234   LABSPEC 1.020 07/17/2015 1234   PHURINE 6.5 07/17/2015 1234   GLUCOSEU NEGATIVE 07/17/2015 1234   HGBUR 2+ (A) 07/17/2015 1234   HGBUR trace-intact 06/13/2008 [place] 07/17/2015 1234   KETONESUR NEGATIVE 07/17/2015 1234   PROTEINUR NEGATIVE 07/17/2015 1234   UROBILINOGEN 0.2 06/13/2008 1045   NITRITE NEGATIVE 07/17/2015 1234   LEUKOCYTESUR NEGATIVE 07/17/2015 1234   Sepsis  Labs: '@LABRCNTIP' (procalcitonin:4,lacticidven:4) ) Recent Results (from the past 240 hour(s))  Respiratory Panel by RT PCR (Flu A&B, Covid) - Nasopharyngeal Swab     Status: None   Collection Time: 03/02/20 10:14 PM   Specimen: Nasopharyngeal Swab  Result Value Ref Range Status   SARS Coronavirus 2 by RT PCR NEGATIVE NEGATIVE Final    Comment: (NOTE) SARS-CoV-2 target nucleic acids are NOT DETECTED.  The SARS-CoV-2 RNA is generally detectable in upper respiratoy specimens during the acute phase of infection. The lowest concentration of SARS-CoV-2 viral copies this assay can detect is 131 copies/mL. A negative result does not preclude SARS-Cov-2 infection and should not be used as the sole basis for treatment or other patient management decisions. A negative result may occur with  improper specimen collection/handling, submission of specimen other than nasopharyngeal swab, presence of viral mutation(s) within the areas targeted by this assay, and inadequate number of viral copies (<131 copies/mL). A negative result must be combined with clinical observations, patient history, and epidemiological information. The expected result is Negative.  Fact Sheet for Patients:  PinkCheek.be  Fact Sheet for Healthcare Providers:  GravelBags.it  This test is no t yet approved or cleared by the [place] FDA and  has been authorized for detection and/or diagnosis of SARS-CoV-2 by FDA under an Emergency Use Authorization (EUA). This EUA will remain  in effect (meaning this test can be used) for the duration of the COVID-19 declaration under Section 564(b)(1) of the Act, 21 U.S.C. section 360bbb-3(b)(1), unless the authorization is terminated or revoked sooner.     Influenza A by PCR NEGATIVE NEGATIVE Final   Influenza B by PCR NEGATIVE NEGATIVE Final    Comment: (NOTE) The Xpert Xpress SARS-CoV-2/FLU/RSV assay is intended as an  aid in  the diagnosis of influenza from Nasopharyngeal swab specimens and  should not be used as a sole basis for treatment. Nasal washings and  aspirates are unacceptable for Xpert Xpress SARS-CoV-2/FLU/RSV  testing.  Fact Sheet for Patients: PinkCheek.be  Fact Sheet for Healthcare Providers: GravelBags.it  This test is not yet approved or cleared by the [place] FDA and  has been authorized for detection and/or diagnosis of SARS-CoV-2 by  FDA under an Emergency Use Authorization (EUA). This EUA will remain  in effect (meaning this test can be used) for the duration of the  Covid-19 declaration under Section 564(b)(1) of the Act, 21  U.S.C. section 360bbb-3(b)(1), unless the authorization is  terminated or revoked. Performed at [facility], [address], [place], [place] [address]      Radiological Exams on Admission: No results found.  EKG: Independently reviewed.  No significant changes shows sinus rhythm.  Assessment/Plan Principal Problem:   Hypertensive urgency Active Problems:   Hypothyroidism   Diabetes mellitus, type II (H[facility])   Adrenal mass, left ([facility])   Hyperlipidemia   OSA (obstructive sleep apnea)   GERD (gastroesophageal reflux disease)   Ductal carcinoma in situ (DCIS) of left breast     #1 hypertensive urgency: Patient will be admitted.  Initiate her home regimen and add IV labetalol.  If as needed does not work we will put her on the drip.  We can add hydralazine also.  Admit to stepdown unit.  #2 diabetes: Hold Metformin.  Continue sliding scale insulin and monitor.  #3 breast cancer: Continue care per oncology  #4 obstructive sleep apnea: Offer CPAP if needed.  #5 GERD: Continue with PPIs.  #6 hypothyroidism: Continue home levothyroxine.   DVT prophylaxis: Lovenox Code Status: Full code Family Communication: Husband Disposition Plan: Home Consults called:  None Admission status: Inpatient to stepdown  Severity of Illness: The appropriate patient status for this patient is INPATIENT. Inpatient status is judged to be reasonable  and necessary in order to provide the required intensity of service to ensure the patient's safety. The patient's presenting symptoms, physical exam findings, and initial radiographic and laboratory data in the context of their chronic comorbidities is felt to place them at high risk for further clinical deterioration. Furthermore, it is not anticipated that the patient will be medically stable for discharge from the hospital within 2 midnights of admission. The following factors support the patient status of inpatient.   " The patient's presenting symptoms include elevated blood pressure. " The worrisome physical exam findings include markedly elevated blood pressure. " The initial radiographic and laboratory data are worrisome because of mostly within normal. " The chronic co-morbidities include breast cancer.   * I certify that at the point of admission it is my clinical judgment that the patient will require inpatient hospital care spanning beyond 2 midnights from the point of admission due to high intensity of service, high risk for further deterioration and high frequency of surveillance required.Barbette Merino MD [facility] Pager [phone]  If 7PM-7AM, please contact night-coverage [email or web address] Password [facility]  03/02/2020, 11:39 PM

## 2020-03-02 NOTE — ED Notes (Signed)
Dr. Eulis Foster aware pts BP 186/104.

## 2020-03-02 NOTE — ED Notes (Signed)
Dr. Eulis Foster aware pts elevated BP.

## 2020-03-03 ENCOUNTER — Encounter: Payer: Self-pay | Admitting: *Deleted

## 2020-03-03 DIAGNOSIS — D0512 Intraductal carcinoma in situ of left breast: Secondary | ICD-10-CM

## 2020-03-03 DIAGNOSIS — G4733 Obstructive sleep apnea (adult) (pediatric): Secondary | ICD-10-CM

## 2020-03-03 DIAGNOSIS — E278 Other specified disorders of adrenal gland: Secondary | ICD-10-CM

## 2020-03-03 DIAGNOSIS — E1149 Type 2 diabetes mellitus with other diabetic neurological complication: Secondary | ICD-10-CM

## 2020-03-03 DIAGNOSIS — K219 Gastro-esophageal reflux disease without esophagitis: Secondary | ICD-10-CM

## 2020-03-03 DIAGNOSIS — E038 Other specified hypothyroidism: Secondary | ICD-10-CM

## 2020-03-03 LAB — COMPREHENSIVE METABOLIC PANEL
ALT: 14 U/L (ref 0–44)
AST: 17 U/L (ref 15–41)
Albumin: 3.7 g/dL (ref 3.5–5.0)
Alkaline Phosphatase: 70 U/L (ref 38–126)
Anion gap: 14 (ref 5–15)
BUN: 16 mg/dL (ref 8–23)
CO2: 29 mmol/L (ref 22–32)
Calcium: 9 mg/dL (ref 8.9–10.3)
Chloride: 100 mmol/L (ref 98–111)
Creatinine, Ser: 0.75 mg/dL (ref 0.44–1.00)
GFR, Estimated: >60 mL/min (ref >60)
Glucose, Bld: 121 mg/dL — ABNORMAL HIGH (ref 70–99)
Potassium: 3.3 mmol/L — ABNORMAL LOW (ref 3.5–5.1)
Sodium: 143 mmol/L (ref 135–145)
Total Bilirubin: 0.9 mg/dL (ref 0.3–1.2)
Total Protein: 7.7 g/dL (ref 6.5–8.1)

## 2020-03-03 LAB — MRSA PCR SCREENING: MRSA by PCR: NEGATIVE

## 2020-03-03 LAB — CBC
HCT: 43.5 % (ref 36.0–46.0)
Hemoglobin: 13.1 g/dL (ref 12.0–15.0)
MCH: 25.7 pg — ABNORMAL LOW (ref 26.0–34.0)
MCHC: 30.1 g/dL (ref 30.0–36.0)
MCV: 85.3 fL (ref 80.0–100.0)
Platelets: 247 10*3/uL (ref 150–400)
RBC: 5.1 MIL/uL (ref 3.87–5.11)
RDW: 16.3 % — ABNORMAL HIGH (ref 11.5–15.5)
WBC: 8.9 10*3/uL (ref 4.0–10.5)
nRBC: 0 % (ref 0.0–0.2)

## 2020-03-03 LAB — GLUCOSE, CAPILLARY
Glucose-Capillary: 111 mg/dL — ABNORMAL HIGH (ref 70–99)
Glucose-Capillary: 96 mg/dL (ref 70–99)

## 2020-03-03 LAB — HIV ANTIBODY (ROUTINE TESTING W REFLEX): HIV Screen 4th Generation wRfx: NONREACTIVE

## 2020-03-03 LAB — HEMOGLOBIN A1C
Hgb A1c MFr Bld: 5.9 % — ABNORMAL HIGH (ref 4.8–5.6)
Mean Plasma Glucose: 122.63 mg/dL

## 2020-03-03 LAB — TSH: TSH: 7.815 u[IU]/mL — ABNORMAL HIGH (ref 0.350–4.500)

## 2020-03-03 MED ORDER — TRAMADOL HCL 50 MG PO TABS
50.0000 mg | ORAL_TABLET | Freq: Four times a day (QID) | ORAL | Status: DC | PRN
  Administered 2020-03-04: 50 mg via ORAL
  Filled 2020-03-03: qty 1

## 2020-03-03 MED ORDER — HYDRALAZINE HCL 10 MG PO TABS
10.0000 mg | ORAL_TABLET | Freq: Once | ORAL | Status: AC
  Administered 2020-03-03: 10 mg via ORAL
  Filled 2020-03-03: qty 1

## 2020-03-03 MED ORDER — LEVOTHYROXINE SODIUM 100 MCG PO TABS
200.0000 ug | ORAL_TABLET | Freq: Every day | ORAL | Status: DC
  Administered 2020-03-03 – 2020-03-05 (×3): 200 ug via ORAL
  Filled 2020-03-03 (×3): qty 2

## 2020-03-03 MED ORDER — PNEUMOCOCCAL VAC POLYVALENT 25 MCG/0.5ML IJ INJ
0.5000 mL | INJECTION | INTRAMUSCULAR | Status: AC
  Administered 2020-03-05: 0.5 mL via INTRAMUSCULAR
  Filled 2020-03-03: qty 0.5

## 2020-03-03 MED ORDER — POTASSIUM CHLORIDE CRYS ER 20 MEQ PO TBCR: 20.0000 meq | EXTENDED_RELEASE_TABLET | Freq: Every day | ORAL | Status: DC | PRN

## 2020-03-03 MED ORDER — INSULIN ASPART 100 UNIT/ML ~~LOC~~ SOLN: 0.0000 [IU] | Freq: Every day | SUBCUTANEOUS | Status: DC

## 2020-03-03 MED ORDER — CLONIDINE HCL 0.1 MG PO TABS
0.1000 mg | ORAL_TABLET | Freq: Every day | ORAL | Status: DC
  Administered 2020-03-03: 0.1 mg via ORAL
  Filled 2020-03-03: qty 1

## 2020-03-03 MED ORDER — CHLORHEXIDINE GLUCONATE CLOTH 2 % EX PADS
6.0000 | MEDICATED_PAD | Freq: Every day | CUTANEOUS | Status: DC
  Administered 2020-03-03 – 2020-03-04 (×2): 6 via TOPICAL

## 2020-03-03 MED ORDER — HYDRALAZINE HCL 20 MG/ML IJ SOLN
10.0000 mg | Freq: Four times a day (QID) | INTRAMUSCULAR | Status: DC | PRN
  Administered 2020-03-03 – 2020-03-04 (×2): 10 mg via INTRAVENOUS
  Filled 2020-03-03 (×2): qty 1

## 2020-03-03 MED ORDER — NAPROXEN 500 MG PO TABS: 500.0000 mg | ORAL_TABLET | Freq: Every day | ORAL | Status: DC | PRN

## 2020-03-03 MED ORDER — ONDANSETRON HCL 4 MG PO TABS: 4.0000 mg | ORAL_TABLET | Freq: Four times a day (QID) | ORAL | Status: DC | PRN

## 2020-03-03 MED ORDER — LOSARTAN POTASSIUM 50 MG PO TABS
100.0000 mg | ORAL_TABLET | Freq: Every day | ORAL | Status: DC
  Administered 2020-03-03: 100 mg via ORAL
  Filled 2020-03-03: qty 2

## 2020-03-03 MED ORDER — INFLUENZA VAC A&B SA ADJ QUAD 0.5 ML IM PRSY
0.5000 mL | PREFILLED_SYRINGE | INTRAMUSCULAR | Status: AC
  Administered 2020-03-05: 0.5 mL via INTRAMUSCULAR
  Filled 2020-03-03: qty 0.5

## 2020-03-03 MED ORDER — GABAPENTIN 300 MG PO CAPS: 300.0000 mg | ORAL_CAPSULE | Freq: Three times a day (TID) | ORAL | Status: DC | PRN

## 2020-03-03 MED ORDER — ACETAMINOPHEN 500 MG PO TABS
1000.0000 mg | ORAL_TABLET | Freq: Two times a day (BID) | ORAL | Status: DC | PRN
  Administered 2020-03-03 – 2020-03-04 (×4): 1000 mg via ORAL
  Filled 2020-03-03 (×4): qty 2

## 2020-03-03 MED ORDER — ONDANSETRON HCL 4 MG/2ML IJ SOLN
4.0000 mg | Freq: Four times a day (QID) | INTRAMUSCULAR | Status: DC | PRN
  Administered 2020-03-04: 4 mg via INTRAVENOUS
  Filled 2020-03-03: qty 2

## 2020-03-03 MED ORDER — ENOXAPARIN SODIUM 60 MG/0.6ML ~~LOC~~ SOLN
50.0000 mg | SUBCUTANEOUS | Status: DC
  Administered 2020-03-03 – 2020-03-04 (×2): 50 mg via SUBCUTANEOUS
  Filled 2020-03-03 (×3): qty 0.5

## 2020-03-03 MED ORDER — INSULIN ASPART 100 UNIT/ML ~~LOC~~ SOLN: 0.0000 [IU] | Freq: Three times a day (TID) | SUBCUTANEOUS | Status: DC

## 2020-03-03 MED ORDER — ADULT MULTIVITAMIN W/MINERALS CH
1.0000 | ORAL_TABLET | Freq: Every day | ORAL | Status: DC
  Administered 2020-03-03 – 2020-03-05 (×3): 1 via ORAL
  Filled 2020-03-03 (×3): qty 1

## 2020-03-03 MED ORDER — CARVEDILOL 12.5 MG PO TABS
25.0000 mg | ORAL_TABLET | Freq: Two times a day (BID) | ORAL | Status: DC
  Administered 2020-03-03 (×3): 25 mg via ORAL
  Filled 2020-03-03 (×3): qty 2

## 2020-03-03 MED ORDER — METOPROLOL TARTRATE 5 MG/5ML IV SOLN
5.0000 mg | Freq: Four times a day (QID) | INTRAVENOUS | Status: DC | PRN
  Administered 2020-03-03 – 2020-03-04 (×3): 5 mg via INTRAVENOUS
  Filled 2020-03-03 (×3): qty 5

## 2020-03-03 MED ORDER — NAPROXEN 250 MG PO TABS
500.0000 mg | ORAL_TABLET | Freq: Every day | ORAL | Status: DC | PRN
  Administered 2020-03-04: 500 mg via ORAL
  Filled 2020-03-03 (×3): qty 2

## 2020-03-03 MED ORDER — CLONIDINE HCL 0.1 MG PO TABS
0.1000 mg | ORAL_TABLET | Freq: Two times a day (BID) | ORAL | Status: DC
  Administered 2020-03-03: 0.1 mg via ORAL
  Filled 2020-03-03 (×2): qty 1

## 2020-03-03 MED ORDER — ORAL CARE MOUTH RINSE
15.0000 mL | Freq: Two times a day (BID) | OROMUCOSAL | Status: DC
  Administered 2020-03-03 – 2020-03-04 (×5): 15 mL via OROMUCOSAL

## 2020-03-03 MED ORDER — POTASSIUM CHLORIDE CRYS ER 20 MEQ PO TBCR
20.0000 meq | EXTENDED_RELEASE_TABLET | Freq: Two times a day (BID) | ORAL | Status: AC
  Administered 2020-03-03 (×2): 20 meq via ORAL
  Filled 2020-03-03 (×2): qty 1

## 2020-03-03 MED ORDER — FUROSEMIDE 20 MG PO TABS: 20.0000 mg | ORAL_TABLET | Freq: Every day | ORAL | Status: DC | PRN

## 2020-03-03 NOTE — TOC Initial Note (Signed)
Transition of Care [facility]) - Initial/Assessment Note    Patient Details  Name: Frances Hughes MRN: [id number] Date of Birth: 07-05-1955  [facility] of [facility]) CM/SW Contact:    Leeroy Cha, RN Phone Number: 03/03/2020, 8:16 AM  Clinical Narrative:                  64 y.o. female with medical history significant of breast cancer currently under oncology care, hypertension, hyperlipidemia, Graves' disease, diabetes, left adrenal mass, obstructive sleep apnea who was seen today by her oncologist for regular visit.  She is on multiple medications for blood pressure.  She was seen with elevated blood pressure systolic more than 485 and diastolic more than 462.  Patient denied any significant symptoms.  She has been undergoing treatment with recent lumpectomy on September 29 showing ductal carcinoma in situ.  She is being prepped for CT simulation on October 28 for possible radiation therapy.  Patient was sent over to the hospital due to hypertensive urgency.  She is still having systolic blood pressures above 703 and diastolic above 500.  No other significant complaints..  ED Course: Temperature is 98.1 blood pressure 264/152, pulse 76 respirate of 30 oxygen sat 90% room air.  CBC and chemistry entirely within normal.  COVID-19 screen is negative.  Glucose 119.  Patient therefore being admitted for hypertensive urgency. Floor-bp 161/87 this am, iv ntg drip on going. Following for progression and toc home needs. Expected Discharge Plan: Home/Self Care Barriers to Discharge: Barriers Unresolved (comment)   Patient Goals and CMS Choice Patient states their goals for this hospitalization and ongoing recovery are:: to go home CMS Medicare.gov Compare Post Acute Care list provided to:: Patient Choice offered to / list presented to : Patient  Expected Discharge Plan and Services Expected Discharge Plan: Home/Self Care   Discharge Planning Services: CM Consult   Living arrangements for  the past 2 months: Single Family Home                                      Prior Living Arrangements/Services Living arrangements for the past 2 months: Single Family Home Lives with:: Self   Do you feel safe going back to the place where you live?: Yes      Need for Family Participation in Patient Care: Yes (Comment) Care giver support system in place?: Yes (comment)   Criminal Activity/Legal Involvement Pertinent to Current Situation/Hospitalization: No - Comment as needed  Activities of Daily Living Home Assistive Devices/Equipment: CBG Meter, CPAP, Dentures (specify type), Blood pressure cuff ADL Screening (condition at time of admission) Patient's cognitive ability adequate to safely complete daily activities?: Yes Is the patient deaf or have difficulty hearing?: No Does the patient have difficulty seeing, even when wearing glasses/contacts?: No Does the patient have difficulty concentrating, remembering, or making decisions?: No Patient able to express need for assistance with ADLs?: Yes Does the patient have difficulty dressing or bathing?: No Independently performs ADLs?: Yes (appropriate for developmental age) Does the patient have difficulty walking or climbing stairs?: No Weakness of Legs: None Weakness of Arms/Hands: None  Permission Sought/Granted                  Emotional Assessment Appearance:: Appears stated age Attitude/Demeanor/Rapport: Engaged Affect (typically observed): Calm Orientation: : Oriented to Self, Oriented to Place, Oriented to  Time, Oriented to Situation Alcohol / Substance Use: Not Applicable Psych  Involvement: No (comment)  Admission diagnosis:  Hypertensive urgency [I16.0] Malignant neoplasm of left female breast, unspecified estrogen receptor status, unspecified site of breast [facility]) [C50.912] Patient Active Problem List   Diagnosis Date Noted  . Hypertensive urgency 03/02/2020  . Ductal carcinoma in situ (DCIS) of left  breast 01/17/2020  . Encounter for screening colonoscopy 03/14/2018  . GERD (gastroesophageal reflux disease) 07/17/2015  . Lumbago 12/30/2014  . Routine general medical examination at a health care facility 12/06/2013  . Hoarse voice quality 12/06/2013  . Diastolic dysfunction 16/94/5038  . SOB (shortness of breath) 11/08/2013  . Systolic murmur 88/28/0034  . Urinary incontinence 09/16/2012  . Leg edema 04/05/2012  . Localized swelling, mass and lump, neck 07/15/2011  . Depression 07/15/2011  . Obesity 07/15/2011  . Scar condition and fibrosis of skin 03/13/2011  . Hypothyroidism 04/05/2006  . Diabetes mellitus, type II ([place]) 04/05/2006  . Adrenal mass, left ([place]) 04/05/2006  . Hyperlipidemia 04/05/2006  . OSA (obstructive sleep apnea) 04/05/2006  . Essential hypertension 04/05/2006   PCP:  Alycia Rossetti, MD Pharmacy:   [place], [address] AT [place] [place] [address] Phone: [phone] Fax: [phone]     Social Determinants of Health (SDOH) Interventions    Readmission Risk Interventions No flowsheet data found.

## 2020-03-03 NOTE — Plan of Care (Signed)
BP improved overnight and nitroglycerin drip stopped at 0330. After 0600, BP started to rise. See MAR for meds given. Patient slept well.  Problem: Education: Goal: Knowledge of General Education information will improve Description: Including pain rating scale, medication(s)/side effects and non-pharmacologic comfort measures Outcome: Progressing   Problem: Health Behavior/Discharge Planning: Goal: Ability to manage health-related needs will improve Outcome: Progressing   Problem: Clinical Measurements: Goal: Ability to maintain clinical measurements within normal limits will improve Outcome: Progressing Goal: Will remain free from infection Outcome: Progressing Goal: Diagnostic test results will improve Outcome: Progressing Goal: Respiratory complications will improve Outcome: Progressing Goal: Cardiovascular complication will be avoided Outcome: Progressing   Problem: Activity: Goal: Risk for activity intolerance will decrease Outcome: Progressing   Problem: Nutrition: Goal: Adequate nutrition will be maintained Outcome: Progressing   Problem: Coping: Goal: Level of anxiety will decrease Outcome: Progressing   Problem: Elimination: Goal: Will not experience complications related to bowel motility Outcome: Progressing Goal: Will not experience complications related to urinary retention Outcome: Progressing   Problem: Pain Managment: Goal: General experience of comfort will improve Outcome: Progressing   Problem: Safety: Goal: Ability to remain free from injury will improve Outcome: Progressing   Problem: Skin Integrity: Goal: Risk for impaired skin integrity will decrease Outcome: Progressing

## 2020-03-03 NOTE — Progress Notes (Signed)
PROGRESS NOTE  PERNIE GROSSO VQM:[id number] DOB: August 15, 1955 DOA: 03/02/2020 PCP: Alycia Rossetti, MD   LOS: 1 day   Brief narrative: As per HPI,  Frances Hughes is a 64 y.o. female with medical history significant of breast cancer currently under oncology care, hypertension, hyperlipidemia, Graves' disease, diabetes mellitus, left adrenal mass, obstructive sleep apnea who was seen by her oncologist for regular visit.  She was on multiple medications for blood pressure.  She was noted to have with elevated blood pressure systolic more than 932 and diastolic more than 671.  Patient denied any significant symptoms.  She has been undergoing treatment with recent lumpectomy on September 29 showing ductal carcinoma in situ.  She is being prepped for CT simulation on October 28 for possible radiation therapy.  Patient was sent over to the hospital due to hypertensive urgency.  She is still having systolic blood pressures above 245 and diastolic above 809.  No other significant complaints. ED Course: Temperature is 98.1 blood pressure 264/152, pulse 76 respirate of 30 oxygen sat 90% room air.  CBC and chemistry entirely within normal.  COVID-19 screen is negative.  Glucose 119.  Patient was then admitted for hypertensive urgency.   Assessment/Plan:  Principal Problem:   Hypertensive urgency Active Problems:   Hypothyroidism   Diabetes mellitus, type II (HCC)   Adrenal mass, left (HCC)   Hyperlipidemia   OSA (obstructive sleep apnea)   GERD (gastroesophageal reflux disease)   Ductal carcinoma in situ (DCIS) of left breast   Hypertensive urgency/refractory hypertension on multiple medications.  Patient was on Coreg 25 mg twice daily, clonidine at nighttime, Lasix 20 mg as needed, losartan 100 mg daily at home.  She does have thyroid issues and sleep apnea as well which could be contributing to hypertension but with imaging findings of adrenal adenoma it is important to look into hormonal  issues.  Patient has a history of a left adrenal gland adenoma which has been followed by the primary care physician with imaging.   I spoke with the primary care office at [facility] and they indicated they did not have any hormonal work-up for adrenal gland in the past. Patient does have mild hypokalemia with refractory hypertension on multiple medications.  Highest blood pressure on admission was 258/156.  Latest blood pressure today at 206/99.   CT scan of the abdomen and pelvis is noted in the system from 2016.  Largest diameter of 3.5 cm Adrenal adenoma with benign features were reported.    At this time, patient will request for 24-hour urinary metanephrines, serum cortisol in a.m., plasma renin aldosterone activity for screening purposes.  24-hour urinary cortisol not available in the system.  1 mg dexamethasone test is also not available.  We will hold off with losartan for now.  Add hydralazine as needed, labetalol as needed.  If the test is equivocal, patient will need formal evaluation as outpatient but at least she would benefit from addition of spironolactone in her regimen for better blood pressure control.  Will need to rule out pheochromocytoma as well.  Diabetes mellitus type 2.  On Metformin at home.  On sliding scale insulin at this time.  Hemoglobin A1c of 5.9 today.  Spoke about lifestyle modification.  Breast cancer: Carcinoma of the left breast.  Status post left breast lumpectomy on 02/19/2020.  Patient is supposed to follow-up with oncology as outpatient  Obstructive sleep apnea: Offer CPAP if needed.   GERD: Continue with PPIs.  hypothyroidism: Continue home levothyroxine.  TSH from today at 7.8.  TSH 2 months back was 1.07.  Question of compliance with Synthroid.  Repeat TSH in 4 to 6 weeks.  DVT prophylaxis: Lovenox subcu    Code Status: Full code  Family Communication: Spoke with the patient's husband and daughter at bedside  Status is:  Inpatient  Remains inpatient appropriate because:IV treatments appropriate due to intensity of illness or inability to take PO, Inpatient level of care appropriate due to severity of illness and Resistant hypertension.   Dispo: The patient is from: Home              Anticipated d/c is to: Home              Anticipated d/c date is: 2 days              Patient currently is not medically stable to d/c.   Consultants:  None  Procedures:  None  Antibiotics:  . None  Anti-infectives (From admission, onward)   None      Subjective: Today, patient was seen and examined at bedside.  Denies any headache, dizziness, chest pain, shortness of breath.  Objective: Vitals:   03/03/20 1430 03/03/20 1500  BP: (!) 206/99 (!) 184/72  Pulse: 60 (!) 57  Resp: (!) 30 (!) 28  Temp:    SpO2: 92% 91%    Intake/Output Summary (Last 24 hours) at 03/03/2020 1605 Last data filed at 03/03/2020 1400 Gross per 24 hour  Intake 771.46 ml  Output --  Net 771.46 ml   Filed Weights   03/02/20 1701  Weight: 107.5 kg   Body mass index is 41.98 kg/m.   Physical Exam: GENERAL: Patient is alert awake and oriented. Not in obvious distress.  Morbidly obese HENT: No scleral pallor or icterus. Pupils equally reactive to light. Oral mucosa is moist NECK: is supple, no gross swelling noted. CHEST: Clear to auscultation. No crackles or wheezes.  Diminished breath sounds bilaterally. CVS: S1 and S2 heard, no murmur. Regular rate and rhythm.  ABDOMEN: Soft, non-tender, bowel sounds are present. EXTREMITIES: No edema. CNS: Cranial nerves are intact. No focal motor deficits. SKIN: warm and dry without rashes.  Data Review: I have personally reviewed the following laboratory data and studies,  CBC: Recent Labs  Lab 03/02/20 1842 03/03/20 0046  WBC 8.5 8.9  NEUTROABS 4.9  --   HGB 13.1 13.1  HCT 43.2 43.5  MCV 84.5 85.3  PLT 274 784   Basic Metabolic Panel: Recent Labs  Lab 03/02/20 1526  03/02/20 1842 03/03/20 0046  NA 142 142 143  K 4.2 3.7 3.3*  CL 104 100 100  CO2 33* 32 29  GLUCOSE 99 119* 121*  BUN 18 17 16   CREATININE 0.74 0.57 0.75  CALCIUM 9.4 9.0 9.0   Liver Function Tests: Recent Labs  Lab 03/02/20 1526 03/03/20 0046  AST 17 17  ALT 13 14  ALKPHOS 78 70  BILITOT 0.3 0.9  PROT 7.6 7.7  ALBUMIN 3.3* 3.7   No results for input(s): LIPASE, AMYLASE in the last 168 hours. No results for input(s): AMMONIA in the last 168 hours. Cardiac Enzymes: No results for input(s): CKTOTAL, CKMB, CKMBINDEX, TROPONINI in the last 168 hours. BNP (last 3 results) No results for input(s): BNP in the last 8760 hours.  ProBNP (last 3 results) No results for input(s): PROBNP in the last 8760 hours.  CBG: Recent Labs  Lab 03/03/20 0105  GLUCAP 111*  Recent Results (from the past 240 hour(s))  Respiratory Panel by RT PCR (Flu A&B, Covid) - Nasopharyngeal Swab     Status: None   Collection Time: 03/02/20 10:14 PM   Specimen: Nasopharyngeal Swab  Result Value Ref Range Status   SARS Coronavirus 2 by RT PCR NEGATIVE NEGATIVE Final    Comment: (NOTE) SARS-CoV-2 target nucleic acids are NOT DETECTED.  The SARS-CoV-2 RNA is generally detectable in upper respiratoy specimens during the acute phase of infection. The lowest concentration of SARS-CoV-2 viral copies this assay can detect is 131 copies/mL. A negative result does not preclude SARS-Cov-2 infection and should not be used as the sole basis for treatment or other patient management decisions. A negative result may occur with  improper specimen collection/handling, submission of specimen other than nasopharyngeal swab, presence of viral mutation(s) within the areas targeted by this assay, and inadequate number of viral copies (<131 copies/mL). A negative result must be combined with clinical observations, patient history, and epidemiological information. The expected result is Negative.  Fact Sheet for  Patients:  PinkCheek.be  Fact Sheet for Healthcare Providers:  GravelBags.it  This test is no t yet approved or cleared by the [place] FDA and  has been authorized for detection and/or diagnosis of SARS-CoV-2 by FDA under an Emergency Use Authorization (EUA). This EUA will remain  in effect (meaning this test can be used) for the duration of the COVID-19 declaration under Section 564(b)(1) of the Act, 21 U.S.C. section 360bbb-3(b)(1), unless the authorization is terminated or revoked sooner.     Influenza A by PCR NEGATIVE NEGATIVE Final   Influenza B by PCR NEGATIVE NEGATIVE Final    Comment: (NOTE) The Xpert Xpress SARS-CoV-2/FLU/RSV assay is intended as an aid in  the diagnosis of influenza from Nasopharyngeal swab specimens and  should not be used as a sole basis for treatment. Nasal washings and  aspirates are unacceptable for Xpert Xpress SARS-CoV-2/FLU/RSV  testing.  Fact Sheet for Patients: PinkCheek.be  Fact Sheet for Healthcare Providers: GravelBags.it  This test is not yet approved or cleared by the [place] FDA and  has been authorized for detection and/or diagnosis of SARS-CoV-2 by  FDA under an Emergency Use Authorization (EUA). This EUA will remain  in effect (meaning this test can be used) for the duration of the  Covid-19 declaration under Section 564(b)(1) of the Act, 21  U.S.C. section 360bbb-3(b)(1), unless the authorization is  terminated or revoked. Performed at [facility], [place] [address] [place], [place] [address]   MRSA PCR Screening     Status: None   Collection Time: 03/03/20 12:15 AM   Specimen: Nasal Mucosa; Nasopharyngeal  Result Value Ref Range Status   MRSA by PCR NEGATIVE NEGATIVE Final    Comment:        The GeneXpert MRSA Assay (FDA approved for NASAL specimens only), is one component of  a comprehensive MRSA colonization surveillance program. It is not intended to diagnose MRSA infection nor to guide or monitor treatment for MRSA infections. Performed at [facility], [place] [address], [place], [place] [address]      Studies: No results found.    Flora Lipps, MD  [facility] 03/03/2020

## 2020-03-04 ENCOUNTER — Inpatient Hospital Stay (HOSPITAL_COMMUNITY): Payer: Medicare Other

## 2020-03-04 ENCOUNTER — Telehealth: Payer: Self-pay | Admitting: Oncology

## 2020-03-04 DIAGNOSIS — I10 Essential (primary) hypertension: Secondary | ICD-10-CM

## 2020-03-04 DIAGNOSIS — E876 Hypokalemia: Secondary | ICD-10-CM

## 2020-03-04 LAB — BASIC METABOLIC PANEL
Anion gap: 11 (ref 5–15)
BUN: 18 mg/dL (ref 8–23)
CO2: 27 mmol/L (ref 22–32)
Calcium: 8.5 mg/dL — ABNORMAL LOW (ref 8.9–10.3)
Chloride: 100 mmol/L (ref 98–111)
Creatinine, Ser: 0.71 mg/dL (ref 0.44–1.00)
GFR, Estimated: >60 mL/min (ref >60)
Glucose, Bld: 117 mg/dL — ABNORMAL HIGH (ref 70–99)
Potassium: 3.1 mmol/L — ABNORMAL LOW (ref 3.5–5.1)
Sodium: 138 mmol/L (ref 135–145)

## 2020-03-04 LAB — GLUCOSE, CAPILLARY
Glucose-Capillary: 105 mg/dL — ABNORMAL HIGH (ref 70–99)
Glucose-Capillary: 113 mg/dL — ABNORMAL HIGH (ref 70–99)
Glucose-Capillary: 113 mg/dL — ABNORMAL HIGH (ref 70–99)
Glucose-Capillary: 116 mg/dL — ABNORMAL HIGH (ref 70–99)
Glucose-Capillary: 103 mg/dL — ABNORMAL HIGH (ref 70–99)
Glucose-Capillary: 99 mg/dL (ref 70–99)
Glucose-Capillary: 128 mg/dL — ABNORMAL HIGH (ref 70–99)

## 2020-03-04 LAB — CBC
HCT: 40.8 % (ref 36.0–46.0)
Hemoglobin: 12.6 g/dL (ref 12.0–15.0)
MCH: 25.8 pg — ABNORMAL LOW (ref 26.0–34.0)
MCHC: 30.9 g/dL (ref 30.0–36.0)
MCV: 83.4 fL (ref 80.0–100.0)
Platelets: 226 10*3/uL (ref 150–400)
RBC: 4.89 MIL/uL (ref 3.87–5.11)
RDW: 16.4 % — ABNORMAL HIGH (ref 11.5–15.5)
WBC: 7.4 10*3/uL (ref 4.0–10.5)
nRBC: 0 % (ref 0.0–0.2)

## 2020-03-04 LAB — CORTISOL-AM, BLOOD: Cortisol - AM: 12.7 ug/dL (ref 6.7–22.6)

## 2020-03-04 LAB — ECHOCARDIOGRAM COMPLETE
Area-P 1/2: 2.66 cm2
Height: 63 in
S' Lateral: 3 cm
Weight: 3792 oz

## 2020-03-04 LAB — T4, FREE: Free T4: 1.13 ng/dL — ABNORMAL HIGH (ref 0.61–1.12)

## 2020-03-04 LAB — MAGNESIUM: Magnesium: 1.9 mg/dL (ref 1.7–2.4)

## 2020-03-04 MED ORDER — HYDRALAZINE HCL 25 MG PO TABS
25.0000 mg | ORAL_TABLET | ORAL | Status: DC | PRN
  Administered 2020-03-04: 25 mg via ORAL
  Filled 2020-03-04: qty 1

## 2020-03-04 MED ORDER — LABETALOL HCL 5 MG/ML IV SOLN
10.0000 mg | INTRAVENOUS | Status: DC | PRN
  Administered 2020-03-04 (×3): 10 mg via INTRAVENOUS
  Filled 2020-03-04 (×4): qty 4

## 2020-03-04 MED ORDER — LABETALOL HCL 5 MG/ML IV SOLN
10.0000 mg | Freq: Once | INTRAVENOUS | Status: AC
  Administered 2020-03-04: 10 mg via INTRAVENOUS
  Filled 2020-03-04: qty 4

## 2020-03-04 MED ORDER — LOSARTAN POTASSIUM 50 MG PO TABS
100.0000 mg | ORAL_TABLET | Freq: Every day | ORAL | Status: DC
  Administered 2020-03-04 – 2020-03-05 (×2): 100 mg via ORAL
  Filled 2020-03-04 (×2): qty 2

## 2020-03-04 MED ORDER — POTASSIUM CHLORIDE CRYS ER 20 MEQ PO TBCR
40.0000 meq | EXTENDED_RELEASE_TABLET | Freq: Once | ORAL | Status: AC
  Administered 2020-03-04: 40 meq via ORAL
  Filled 2020-03-04: qty 2

## 2020-03-04 MED ORDER — HYDRALAZINE HCL 10 MG PO TABS: 10.0000 mg | ORAL_TABLET | Freq: Once | ORAL | Status: DC

## 2020-03-04 MED ORDER — MAGNESIUM OXIDE 400 (241.3 MG) MG PO TABS
800.0000 mg | ORAL_TABLET | Freq: Once | ORAL | Status: AC
  Administered 2020-03-04: 800 mg via ORAL
  Filled 2020-03-04: qty 2

## 2020-03-04 MED ORDER — LABETALOL HCL 5 MG/ML IV SOLN
10.0000 mg | Freq: Once | INTRAVENOUS | Status: AC
  Administered 2020-03-04: 10 mg via INTRAVENOUS

## 2020-03-04 MED ORDER — AMLODIPINE BESYLATE 10 MG PO TABS
10.0000 mg | ORAL_TABLET | Freq: Every day | ORAL | Status: DC
  Administered 2020-03-04 – 2020-03-05 (×2): 10 mg via ORAL
  Filled 2020-03-04 (×2): qty 1

## 2020-03-04 MED ORDER — SODIUM CHLORIDE 0.9 % IV SOLN
INTRAVENOUS | Status: DC | PRN
  Administered 2020-03-04: 250 mL via INTRAVENOUS

## 2020-03-04 MED ORDER — HYDRALAZINE HCL 50 MG PO TABS
50.0000 mg | ORAL_TABLET | Freq: Three times a day (TID) | ORAL | Status: DC
  Administered 2020-03-04 – 2020-03-05 (×2): 50 mg via ORAL
  Filled 2020-03-04 (×2): qty 1

## 2020-03-04 NOTE — Telephone Encounter (Signed)
Per 10/12 schedule message to sch with GM at 3P- Moved genetis to 1300/1415labs/1500 GM- vm full left message with daughter

## 2020-03-04 NOTE — Assessment & Plan Note (Addendum)
-  Previous CT from May 2016 reviewed.Left adrenal gland measures 3.5 x 2.2 cm (insignificant change compared to CT from 2007) -Given refractory hypertension, patient has undergone further work-up -Serum cortisol level normal, 12.7 ug/dL - TSH 7.81, FT4 1.13, TT3 pending. No changes needed to synthroid  - follow up aldo/renin - follow up urine metanephrines

## 2020-03-04 NOTE — TOC Progression Note (Signed)
Transition of Care [facility]) - Progression Note    Patient Details  Name: Frances Hughes MRN: [id number] Date of Birth: 1955-06-27  Transition of [facility] CM/SW Contact  Leeroy Cha, RN Phone Number: 03/04/2020, 8:17 AM  Clinical Narrative:    High bp at 206/124, on room air, iv ntg drip ongoing. follwing for progression and toc needs/may need hhc for bp checks or resources to get bp checked if not at [facility].   Expected Discharge Plan: Home/Self Care Barriers to Discharge: Barriers Unresolved (comment)  Expected Discharge Plan and Services Expected Discharge Plan: Home/Self Care   Discharge Planning Services: CM Consult   Living arrangements for the past 2 months: Single Family Home                                       Social Determinants of Health (SDOH) Interventions    Readmission Risk Interventions No flowsheet data found.

## 2020-03-04 NOTE — Assessment & Plan Note (Signed)
-  Resume statin at discharge 

## 2020-03-04 NOTE — Assessment & Plan Note (Signed)
-  Replete and recheck as needed 

## 2020-03-04 NOTE — Progress Notes (Signed)
Echocardiogram 2D Echocardiogram has been performed.  Frances Hughes Saharra [name] 03/04/2020, 2:56 PM

## 2020-03-04 NOTE — Hospital Course (Addendum)
Frances Hughes is a 65 yo AA female with PMH left breast DCIS (s/p lumpectomy 02/19/20 with adjuvant radiation to follow), HTN, OSA on CPAP, HLD, Grave's disease (s/p RAI), DMII, left adrenal mass who presented from her oncology office after found to have elevated BP during her appointment to discuss radiation treatment.  She states her blood pressure is moderately well controlled at home with intermittent elevations but typically has been controlled in her perspective. The day of admission her blood pressure was 230/107 on triage. Her adrenal adenoma had not appeared to be worked up in the past and therefore work-up was commenced on admission as well. She also was started on further treatment for BP control.  Her blood pressure regimen was modified during hospitalization and remained much improved compared to admission.  She may still need further adjustment at outpatient follow-up. At discharge she was continued on amlodipine 10 mg daily, hydralazine 50 mg 3 times daily, and losartan 100 mg daily. She will need review of her pending adrenal work-up.  24-hour urine metanephrine, renin/Aldo ratio were pending at time of discharge.

## 2020-03-04 NOTE — Assessment & Plan Note (Addendum)
-   severely elevated BP on admission; patient states she was not nervous etc at onc office however at home it does become elevated at times to this degree - home regimen: coreg 25 BID, clonidine 0.1 qhs, lasix PRN, losartan 100 daily  - started on losartan 100 mg daily, amlodipine 10 mg daily, and hydralazine which was uptitrated; No CHF noted on echo (EF 60-65%, Gr 1 DD) - follow up adrenal gland workup - already on CPAP for OSA

## 2020-03-04 NOTE — Assessment & Plan Note (Signed)
-  Last A1c 5.9% on 03/03/2020 -Continue diet control

## 2020-03-04 NOTE — Progress Notes (Signed)
Keeping urine on ice, Hydralazine given for hypertension; pt denies any other problems.

## 2020-03-04 NOTE — Assessment & Plan Note (Signed)
-  Continue nightly CPAP °

## 2020-03-04 NOTE — Assessment & Plan Note (Addendum)
-  History of Graves' disease s/p PTU and RAI - currently on Synthroid 200 mcg daily - TSH elevated 7.815, FT4 1.13, TT3 pending - continued on home dose at discharge; no change indicated

## 2020-03-04 NOTE — Assessment & Plan Note (Signed)
-  Follows outpatient with oncology.  Recently seen on 03/02/2020 prior to being sent to the ER for hypertensive urgency -Patient is status post lumpectomy on 02/19/2020 with further plans for adjuvant radiation later in October -Continue outpatient care with oncology at discharge

## 2020-03-04 NOTE — Assessment & Plan Note (Signed)
-   see htn urgency workup - final home regimen still TBD as BP not at goal yet

## 2020-03-04 NOTE — Progress Notes (Signed)
PROGRESS NOTE    Frances Hughes   BPZ:[id number]  DOB: 1955/11/16  DOA: 03/02/2020     2  PCP: Frances Rossetti, MD  CC: Elevated blood pressure  Hospital Course: 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  She states her blood pressure is moderately well controlled at home with intermittent elevations but typically has been controlled in her perspective. The day of admission her blood pressure was 230/107 on triage. Her adrenal adenoma had not appeared to be worked up in the past and therefore work-up was commenced on admission as well. She also was started on further treatment for BP control.   Interval History:  Patient sitting up in chair bedside this morning resting comfortably.  Was briefly on nitroglycerin drip overnight.  She denies any chest pain or shortness of breath.  Denies any vision changes.  Has had a mild headache which is not uncommon for her when her blood pressure is elevated.  Old records reviewed in assessment of this patient  ROS: Constitutional: negative for chills and fevers, Respiratory: negative for cough, Cardiovascular: negative for chest pain and Gastrointestinal: negative for abdominal pain  Assessment & Plan: * Hypertensive urgency - severely elevated BP on admission; patient states she was not nervous etc at onc office however at home it does become elevated at times to this degree - home regimen: coreg 25 BID, clonidine 0.1 qhs, lasix PRN, losartan 100 daily - briefly on NTG gtt last night; d/c this - use PRN labetalol or hydralazine for now for transient elevations - start on losartan 100 mg daily, amlodipine 10 mg daily and trend BP. No CHF noted on echo. Next meds would  be chlorthalidone or lasix then probably hydralazine  - follow up adrenal gland workup - already on CPAP for OSA - echo outdated; will repeat now  Essential hypertension - see htn urgency workup - final home regimen still TBD as BP not at goal yet   Adrenal mass, left [facility]) -Previous CT from May 2016 reviewed.Left adrenal gland measures 3.5 x 2.2 cm (insignificant change compared to CT from 2007) -Given refractory hypertension, patient has undergone further work-up -Serum cortisol level normal, 12.7 ug/dL - TSH 7.81, FT4 1.13, TT3 pending - follow up aldo/renin - follow up metanephrines   Hypothyroidism -History of Graves' disease s/p PTU and RAI - currently on Synthroid 200 mcg daily - TSH elevated 7.815, FT4 1.13, TT3 pending - await TT3 prior to adjustments   Hypokalemia -Replete and recheck as needed  Ductal carcinoma in situ (DCIS) of left breast -Follows outpatient with oncology.  Recently seen on 03/02/2020 prior to being sent to the ER for hypertensive urgency -Patient is status post lumpectomy on 02/19/2020 with further plans for adjuvant radiation later in October -Continue outpatient care with oncology at discharge  OSA (obstructive sleep apnea) -Continue nightly CPAP  Hyperlipidemia -Resume statin at discharge  Diabetes mellitus, type II (HCC) -Last A1c 5.9% on 03/03/2020 -Continue diet control   Antimicrobials: None  DVT prophylaxis: Lovenox Code Status: Full Family Communication: none present Disposition Plan: Status is: Inpatient  Remains inpatient appropriate because:Hemodynamically unstable, Ongoing diagnostic testing needed not appropriate for outpatient work up, Unsafe d/c plan, IV treatments appropriate due to intensity of  illness or inability to take PO and Inpatient level of care appropriate due to severity of illness   Dispo: The patient is from: Home              Anticipated d/c is to: Home              Anticipated d/c date is: 2 days               Patient currently is not medically stable to d/c.       Objective: Blood pressure (!) 172/99, pulse 60, temperature 97.9 F (36.6 C), temperature source Oral, resp. rate 17, height 5\' 3"  (1.6 m), weight 107.5 kg, SpO2 98 %.  Examination: General appearance: alert, cooperative and no distress Head: Normocephalic, without obvious abnormality, atraumatic Eyes: EOMI Lungs: clear to auscultation bilaterally Heart: regular rate and rhythm and S1, S2 normal Abdomen: normal findings: bowel sounds normal and soft, non-tender Extremities: no edema Skin: mobility and turgor normal Neurologic: Grossly normal  Consultants:   None  Procedures:   None  Data Reviewed: I have personally reviewed following labs and imaging studies Results for orders placed or performed during the hospital encounter of 03/02/20 (from the past 24 hour(s))  Glucose, capillary     Status: Abnormal   Collection Time: 03/03/20  4:35 PM  Result Value Ref Range   Glucose-Capillary 113 (H) 70 - 99 mg/dL  Glucose, capillary     Status: None   Collection Time: 03/03/20  9:17 PM  Result Value Ref Range   Glucose-Capillary 96 70 - 99 mg/dL  Cortisol-am, blood     Status: None   Collection Time: 03/04/20  2:49 AM  Result Value Ref Range   Cortisol - AM 12.7 6.7 - 22.6 ug/dL  Basic metabolic panel     Status: Abnormal   Collection Time: 03/04/20  6:40 AM  Result Value Ref Range   Sodium 138 135 - 145 mmol/L   Potassium 3.1 (L) 3.5 - 5.1 mmol/L   Chloride 100 98 - 111 mmol/L   CO2 27 22 - 32 mmol/L   Glucose, Bld 117 (H) 70 - 99 mg/dL   BUN 18 8 - 23 mg/dL   Creatinine, Ser 0.71 0.44 - 1.00 mg/dL   Calcium 8.5 (L) 8.9 - 10.3 mg/dL   GFR, Estimated >60 >60 mL/min   Anion gap 11 5 - 15  CBC     Status: Abnormal   Collection Time: 03/04/20  6:40 AM  Result Value Ref Range   WBC 7.4 4.0 - 10.5 K/uL   RBC 4.89 3.87 - 5.11 MIL/uL   Hemoglobin 12.6 12.0 - 15.0 g/dL   HCT 40.8 36 - 46 %   MCV 83.4 80.0  - 100.0 fL   MCH 25.8 (L) 26.0 - 34.0 pg   MCHC 30.9 30.0 - 36.0 g/dL   RDW 16.4 (H) 11.5 - 15.5 %   Platelets 226 150 - 400 K/uL   nRBC 0.0 0.0 - 0.2 %  Magnesium     Status: None   Collection Time: 03/04/20  6:40 AM  Result Value Ref Range   Magnesium 1.9 1.7 - 2.4 mg/dL  Glucose, capillary     Status: Abnormal   Collection Time: 03/04/20  7:29 AM  Result Value Ref Range   Glucose-Capillary 116 (H) 70 - 99 mg/dL   Comment 1 Notify RN    Comment 2 Document in Chart   T4, free     Status: Abnormal   Collection  Time: 03/04/20  8:27 AM  Result Value Ref Range   Free T4 1.13 (H) 0.61 - 1.12 ng/dL  Glucose, capillary     Status: Abnormal   Collection Time: 03/04/20 11:38 AM  Result Value Ref Range   Glucose-Capillary 103 (H) 70 - 99 mg/dL   Comment 1 Notify RN    Comment 2 Document in Chart     Recent Results (from the past 240 hour(s))  Respiratory Panel by RT PCR (Flu A&B, Covid) - Nasopharyngeal Swab     Status: None   Collection Time: 03/02/20 10:14 PM   Specimen: Nasopharyngeal Swab  Result Value Ref Range Status   SARS Coronavirus 2 by RT PCR NEGATIVE NEGATIVE Final    Comment: (NOTE) SARS-CoV-2 target nucleic acids are NOT DETECTED.  The SARS-CoV-2 RNA is generally detectable in upper respiratoy specimens during the acute phase of infection. The lowest concentration of SARS-CoV-2 viral copies this assay can detect is 131 copies/mL. A negative result does not preclude SARS-Cov-2 infection and should not be used as the sole basis for treatment or other patient management decisions. A negative result may occur with  improper specimen collection/handling, submission of specimen other than nasopharyngeal swab, presence of viral mutation(s) within the areas targeted by this assay, and inadequate number of viral copies (<131 copies/mL). A negative result must be combined with clinical observations, patient history, and epidemiological information. The expected result is  Negative.  Fact Sheet for Patients:  PinkCheek.be  Fact Sheet for Healthcare Providers:  GravelBags.it  This test is no t yet approved or cleared by the [place] FDA and  has been authorized for detection and/or diagnosis of SARS-CoV-2 by FDA under an Emergency Use Authorization (EUA). This EUA will remain  in effect (meaning this test can be used) for the duration of the COVID-19 declaration under Section 564(b)(1) of the Act, 21 U.S.C. section 360bbb-3(b)(1), unless the authorization is terminated or revoked sooner.     Influenza A by PCR NEGATIVE NEGATIVE Final   Influenza B by PCR NEGATIVE NEGATIVE Final    Comment: (NOTE) The Xpert Xpress SARS-CoV-2/FLU/RSV assay is intended as an aid in  the diagnosis of influenza from Nasopharyngeal swab specimens and  should not be used as a sole basis for treatment. Nasal washings and  aspirates are unacceptable for Xpert Xpress SARS-CoV-2/FLU/RSV  testing.  Fact Sheet for Patients: PinkCheek.be  Fact Sheet for Healthcare Providers: GravelBags.it  This test is not yet approved or cleared by the [place] FDA and  has been authorized for detection and/or diagnosis of SARS-CoV-2 by  FDA under an Emergency Use Authorization (EUA). This EUA will remain  in effect (meaning this test can be used) for the duration of the  Covid-19 declaration under Section 564(b)(1) of the Act, 21  U.S.C. section 360bbb-3(b)(1), unless the authorization is  terminated or revoked. Performed at [facility], [place] [address], [place], [place] [address]   MRSA PCR Screening     Status: None   Collection Time: 03/03/20 12:15 AM   Specimen: Nasal Mucosa; Nasopharyngeal  Result Value Ref Range Status   MRSA by PCR NEGATIVE NEGATIVE Final    Comment:        The GeneXpert MRSA Assay (FDA approved for NASAL  specimens only), is one component of a comprehensive MRSA colonization surveillance program. It is not intended to diagnose MRSA infection nor to guide or monitor treatment for MRSA infections. Performed at [facility], [place] [address] [place],  [place] [address]      Radiology Studies: No results found. No orders to display    Scheduled Meds: . amLODipine  10 mg Oral Daily  . Chlorhexidine Gluconate Cloth  6 each Topical Daily  . enoxaparin (LOVENOX) injection  50 mg Subcutaneous Q24H  . influenza vaccine adjuvanted  0.5 mL Intramuscular Tomorrow-1000  . insulin aspart  0-20 Units Subcutaneous TID WC  . insulin aspart  0-5 Units Subcutaneous QHS  . levothyroxine  200 mcg Oral QAC breakfast  . losartan  100 mg Oral Daily  . mouth rinse  15 mL Mouth Rinse BID  . multivitamin with minerals  1 tablet Oral Daily  . pneumococcal 23 valent vaccine  0.5 mL Intramuscular Tomorrow-1000   PRN Meds: sodium chloride, acetaminophen, furosemide, gabapentin, hydrALAZINE, labetalol, naproxen, ondansetron **OR** ondansetron (ZOFRAN) IV, potassium chloride SA, traMADol Continuous Infusions: . sodium chloride 250 mL (03/04/20 0700)  . nitroGLYCERIN 5 mcg/min (03/04/20 0800)      LOS: 2 days  Time spent: Greater than 50% of the 35 minute visit was spent in counseling/coordination of care for the patient as laid out in the A&P.   Dwyane Dee, MD [facility] 03/04/2020, 1:25 PM

## 2020-03-05 LAB — CBC WITH DIFFERENTIAL/PLATELET
Abs Immature Granulocytes: 0.01 10*3/uL (ref 0.00–0.07)
Basophils Absolute: 0 10*3/uL (ref 0.0–0.1)
Basophils Relative: 0 %
Eosinophils Absolute: 0.2 10*3/uL (ref 0.0–0.5)
Eosinophils Relative: 2 %
HCT: 40.2 % (ref 36.0–46.0)
Hemoglobin: 12.5 g/dL (ref 12.0–15.0)
Immature Granulocytes: 0 %
Lymphocytes Relative: 31 %
Lymphs Abs: 2.5 10*3/uL (ref 0.7–4.0)
MCH: 26 pg (ref 26.0–34.0)
MCHC: 31.1 g/dL (ref 30.0–36.0)
MCV: 83.8 fL (ref 80.0–100.0)
Monocytes Absolute: 0.6 10*3/uL (ref 0.1–1.0)
Monocytes Relative: 8 %
Neutro Abs: 4.7 10*3/uL (ref 1.7–7.7)
Neutrophils Relative %: 59 %
Platelets: 239 10*3/uL (ref 150–400)
RBC: 4.8 MIL/uL (ref 3.87–5.11)
RDW: 16.5 % — ABNORMAL HIGH (ref 11.5–15.5)
WBC: 8 10*3/uL (ref 4.0–10.5)
nRBC: 0 % (ref 0.0–0.2)

## 2020-03-05 LAB — MAGNESIUM: Magnesium: 2 mg/dL (ref 1.7–2.4)

## 2020-03-05 LAB — BASIC METABOLIC PANEL
Anion gap: 10 (ref 5–15)
BUN: 19 mg/dL (ref 8–23)
CO2: 28 mmol/L (ref 22–32)
Calcium: 8.7 mg/dL — ABNORMAL LOW (ref 8.9–10.3)
Chloride: 104 mmol/L (ref 98–111)
Creatinine, Ser: 0.79 mg/dL (ref 0.44–1.00)
GFR, Estimated: >60 mL/min (ref >60)
Glucose, Bld: 111 mg/dL — ABNORMAL HIGH (ref 70–99)
Potassium: 3.1 mmol/L — ABNORMAL LOW (ref 3.5–5.1)
Sodium: 142 mmol/L (ref 135–145)

## 2020-03-05 LAB — T3: T3, Total: 60 ng/dL — ABNORMAL LOW (ref 71–180)

## 2020-03-05 LAB — GLUCOSE, CAPILLARY: Glucose-Capillary: 109 mg/dL — ABNORMAL HIGH (ref 70–99)

## 2020-03-05 MED ORDER — AMLODIPINE BESYLATE 10 MG PO TABS
10.0000 mg | ORAL_TABLET | Freq: Every day | ORAL | 3 refills | Status: DC
Start: 2020-03-05 — End: 2020-03-17

## 2020-03-05 MED ORDER — HYDRALAZINE HCL 50 MG PO TABS
50.0000 mg | ORAL_TABLET | Freq: Three times a day (TID) | ORAL | 3 refills | Status: DC
Start: 2020-03-05 — End: 2021-02-03

## 2020-03-05 MED ORDER — POTASSIUM CHLORIDE CRYS ER 20 MEQ PO TBCR
40.0000 meq | EXTENDED_RELEASE_TABLET | ORAL | Status: AC
  Administered 2020-03-05 (×2): 40 meq via ORAL
  Filled 2020-03-05 (×2): qty 2

## 2020-03-05 MED ORDER — POTASSIUM CHLORIDE CRYS ER 20 MEQ PO TBCR: 40.0000 meq | EXTENDED_RELEASE_TABLET | Freq: Every day | ORAL | Status: DC

## 2020-03-05 NOTE — Discharge Summary (Signed)
Physician Discharge Summary   [name] JQZ:[id number] DOB: Jan 16, 1956 DOA: 03/02/2020  PCP: Alycia Rossetti, MD  Admit date: 03/02/2020 Discharge date: 03/05/2020  Admitted From: home Disposition:  home Discharging physician: Dwyane Dee, MD  Recommendations for Outpatient Follow-up:  1. Adjust BP regimen further 2. Follow up pending urine metaneph and renin/aldo ratio 3. Follow up with oncology  Patient discharged to home in Discharge Condition: stable CODE STATUS: Full Diet recommendation:  Diet Orders (From admission, onward)    Start     Ordered   03/05/20 0000  Diet - low sodium heart healthy        03/05/20 0732   03/03/20 0014  Diet heart healthy/carb modified Room service appropriate? Yes; Fluid consistency: Thin  Diet effective now       Question Answer Comment  Diet-HS Snack? Nothing   Room service appropriate? 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  She states her blood pressure is moderately well controlled at home with intermittent elevations but typically has been controlled in her perspective. The day of admission her blood pressure was 230/107 on triage. Her adrenal adenoma had not appeared to be worked up in the past and therefore work-up was commenced on admission as well. She also was started on further treatment for BP control.  Her blood pressure regimen was modified during hospitalization and remained much improved compared to admission.  She may still need further adjustment at outpatient follow-up. At discharge she was continued on amlodipine 10 mg daily, hydralazine 50 mg 3 times  daily, and losartan 100 mg daily. She will need review of her pending adrenal work-up.  24-hour urine metanephrine, renin/Aldo ratio were pending at time of discharge.   * Hypertensive urgency - severely elevated BP on admission; patient states she was not nervous etc at onc office however at home it does become elevated at times to this degree - home regimen: coreg 25 BID, clonidine 0.1 qhs, lasix PRN, losartan 100 daily  - started on losartan 100 mg daily, amlodipine 10 mg daily, and hydralazine which was uptitrated; No CHF noted on echo (EF 60-65%, Gr 1 DD) - follow up adrenal gland workup - already on CPAP for OSA  Essential hypertension - see htn urgency workup - final home regimen still TBD as BP not at goal yet   Adrenal mass, left [facility]) -Previous CT from May 2016 reviewed.Left adrenal gland measures 3.5 x 2.2 cm (insignificant change compared to CT from 2007) -Given refractory hypertension, patient has undergone further work-up -Serum cortisol level normal, 12.7 ug/dL - TSH 7.81, FT4 1.13, TT3 pending. No changes needed to synthroid  - follow up aldo/renin - follow up urine metanephrines   Hypothyroidism -History of Graves' disease s/p PTU and RAI - currently on Synthroid 200 mcg daily - TSH elevated 7.815, FT4 1.13, TT3 pending - continued on home dose at discharge; no change indicated   Hypokalemia -Replete and recheck as needed  Ductal carcinoma in situ (DCIS) of left breast -Follows outpatient with oncology.  Recently seen on 03/02/2020 prior to being sent to the ER for  hypertensive urgency -Patient is status post lumpectomy on 02/19/2020 with further plans for adjuvant radiation later in October -Continue outpatient care with oncology at discharge  OSA (obstructive sleep apnea) -Continue nightly CPAP  Hyperlipidemia -Resume statin at discharge  Diabetes mellitus, type II (HCC) -Last A1c 5.9% on 03/03/2020 -Continue diet control    The patient's chronic  medical conditions were treated accordingly per the patient's home medication regimen except as noted.  On day of discharge, patient was felt deemed stable for discharge. Patient/family member advised to call PCP or come back to ER if needed.   Discharge Diagnoses:   Principal Diagnosis: Hypertensive urgency  Active Hospital Problems   Diagnosis Date Noted  . Hypertensive urgency 03/02/2020    Priority: High  . Adrenal mass, left ([place]) 04/05/2006    Priority: Medium  . Hypothyroidism 04/05/2006    Priority: Medium  . Essential hypertension 04/05/2006    Priority: Medium  . Hypokalemia 03/04/2020  . 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 Diabetes mellitus, type II ([place]) 04/05/2006  . Hyperlipidemia 04/05/2006  . OSA (obstructive sleep apnea) 04/05/2006    Resolved Hospital Problems  No resolved problems to display.    Discharge Instructions    Diet - low sodium heart healthy   Complete by: As directed    Increase activity slowly   Complete by: As directed    No wound care   Complete by: As directed      Allergies as of 03/05/2020   No Known Allergies     Medication List    STOP taking these medications   carvedilol 25 MG tablet Commonly known as: COREG   cloNIDine 0.1 MG tablet Commonly known as: CATAPRES   escitalopram 10 MG tablet Commonly known as: LEXAPRO   furosemide 20 MG tablet Commonly known as: LASIX     TAKE these medications   acetaminophen 650 MG CR tablet Commonly known as: TYLENOL Take 1,300 mg by mouth 2 (two) times daily as needed for pain.   amLODipine 10 MG tablet Commonly known as: NORVASC Take 1 tablet (10 mg total) by mouth daily.   atorvastatin 40 MG tablet Commonly known as: LIPITOR Take 1 tablet (40 mg total) by mouth daily.   Blood Glucose Monitor System w/Device Kit Please dispense based on patient and insurance preference. Use to monitor fasting blood sugar 1x daily. Dx: E11.9   BLOOD GLUCOSE TEST  STRIPS Strp Please dispense based on patient and insurance preference. Use to monitor fasting blood sugar 1x daily. Dx: E11.9   CENTRUM SILVER PO Take 1 tablet by mouth daily.   gabapentin 300 MG capsule Commonly known as: NEURONTIN Take 300 mg by mouth 3 (three) times daily as needed (pain).   hydrALAZINE 50 MG tablet Commonly known as: APRESOLINE Take 1 tablet (50 mg total) by mouth 3 (three) times daily.   Lancets 28G Misc Please dispense based on patient and insurance preference. Use to monitor fasting blood sugar 1x daily. Dx: E11.9   levothyroxine 200 MCG tablet Commonly known as: SYNTHROID TAKE 1 TABLET BY MOUTH EVERY DAY BEFORE BREAKFAST What changed:   how much to take  how to take this  when to take this  additional instructions   losartan 100 MG tablet Commonly known as: COZAAR Take 1 tablet (100 mg total) by mouth daily.   metFORMIN 500 MG tablet Commonly known as: GLUCOPHAGE TAKE 1 TABLET BY MOUTH TWICE DAILY WITH A MEAL What changed: See the new instructions.  naproxen 500 MG tablet Commonly known as: NAPROSYN Take 1 tablet (500 mg total) by mouth daily as needed for mild pain. What changed: See the new instructions.   potassium chloride SA 20 MEQ tablet Commonly known as: KLOR-CON Take 1 tablet (20 mEq total) by mouth daily. What changed:   when to take this  reasons to take this   traMADol 50 MG tablet Commonly known as: ULTRAM Take 1 tablet (50 mg total) by mouth every 6 (six) hours as needed for moderate pain or severe pain. What changed: Another medication with the same name was removed. Continue taking this medication, and follow the directions you see here.       No Known Allergies  Consultations: none  Discharge Exam: BP (!) 183/96   Pulse 69   Temp 98.2 F (36.8 C) (Oral)   Resp 19   Ht '5\' 3"'  (1.6 m)   Wt 107.5 kg   SpO2 94%   BMI 41.98 kg/m  General appearance: alert, cooperative and no distress Head:  Normocephalic, without obvious abnormality, atraumatic Eyes: EOMI Lungs: clear to auscultation bilaterally Heart: regular rate and rhythm and S1, S2 normal Abdomen: normal findings: bowel sounds normal and soft, non-tender Extremities: no edema Skin: mobility and turgor normal Neurologic: Grossly normal  The results of significant diagnostics from this hospitalization (including imaging, microbiology, ancillary and laboratory) are listed below for reference.   Microbiology: Recent Results (from the past 240 hour(s))  Respiratory Panel by RT PCR (Flu A&B, Covid) - Nasopharyngeal Swab     Status: None   Collection Time: 03/02/20 10:14 PM   Specimen: Nasopharyngeal Swab  Result Value Ref Range Status   SARS Coronavirus 2 by RT PCR NEGATIVE NEGATIVE Final    Comment: (NOTE) SARS-CoV-2 target nucleic acids are NOT DETECTED.  The SARS-CoV-2 RNA is generally detectable in upper respiratoy specimens during the acute phase of infection. The lowest concentration of SARS-CoV-2 viral copies this assay can detect is 131 copies/mL. A negative result does not preclude SARS-Cov-2 infection and should not be used as the sole basis for treatment or other patient management decisions. A negative result may occur with  improper specimen collection/handling, submission of specimen other than nasopharyngeal swab, presence of viral mutation(s) within the areas targeted by this assay, and inadequate number of viral copies (<131 copies/mL). A negative result must be combined with clinical observations, patient history, and epidemiological information. The expected result is Negative.  Fact Sheet for Patients:  PinkCheek.be  Fact Sheet for Healthcare Providers:  GravelBags.it  This test is no t yet approved or cleared by the [place] FDA and  has been authorized for detection and/or diagnosis of SARS-CoV-2 by FDA under an Emergency Use  Authorization (EUA). This EUA will remain  in effect (meaning this test can be used) for the duration of the COVID-19 declaration under Section 564(b)(1) of the Act, 21 U.S.C. section 360bbb-3(b)(1), unless the authorization is terminated or revoked sooner.     Influenza A by PCR NEGATIVE NEGATIVE Final   Influenza B by PCR NEGATIVE NEGATIVE Final    Comment: (NOTE) The Xpert Xpress SARS-CoV-2/FLU/RSV assay is intended as an aid in  the diagnosis of influenza from Nasopharyngeal swab specimens and  should not be used as a sole basis for treatment. Nasal washings and  aspirates are unacceptable for Xpert Xpress SARS-CoV-2/FLU/RSV  testing.  Fact Sheet for Patients: PinkCheek.be  Fact Sheet for Healthcare Providers: GravelBags.it  This test is not yet approved or cleared by the  [place] Architectural technologist and  has been authorized for detection and/or diagnosis of SARS-CoV-2 by  FDA under an Print production planner (EUA). This EUA will remain  in effect (meaning this test can be used) for the duration of the  Covid-19 declaration under Section 564(b)(1) of the Act, 21  U.S.C. section 360bbb-3(b)(1), unless the authorization is  terminated or revoked. Performed at [facility], [place] [address] [place], [place] [address]   MRSA PCR Screening     Status: None   Collection Time: 03/03/20 12:15 AM   Specimen: Nasal Mucosa; Nasopharyngeal  Result Value Ref Range Status   MRSA by PCR NEGATIVE NEGATIVE Final    Comment:        The GeneXpert MRSA Assay (FDA approved for NASAL specimens only), is one component of a comprehensive MRSA colonization surveillance program. It is not intended to diagnose MRSA infection nor to guide or monitor treatment for MRSA infections. Performed at [facility], [place] [address] [place], [place] [address]      Labs: BNP (last 3 results) No results for  input(s): BNP in the last 8760 hours. Basic Metabolic Panel: Recent Labs  Lab 03/02/20 1526 03/02/20 1842 03/03/20 0046 03/04/20 0640 03/05/20 0255  NA 142 142 143 138 142  K 4.2 3.7 3.3* 3.1* 3.1*  CL 104 100 100 100 104  CO2 33* 32 '29 27 28  ' GLUCOSE 99 119* 121* 117* 111*  BUN '18 17 16 18 19  ' CREATININE 0.74 0.57 0.75 0.71 0.79  CALCIUM 9.4 9.0 9.0 8.5* 8.7*  MG  --   --   --  1.9 2.0   Liver Function Tests: Recent Labs  Lab 03/02/20 1526 03/03/20 0046  AST 17 17  ALT 13 14  ALKPHOS 78 70  BILITOT 0.3 0.9  PROT 7.6 7.7  ALBUMIN 3.3* 3.7   No results for input(s): LIPASE, AMYLASE in the last 168 hours. No results for input(s): AMMONIA in the last 168 hours. CBC: Recent Labs  Lab 03/02/20 1842 03/03/20 0046 03/04/20 0640 03/05/20 0255  WBC 8.5 8.9 7.4 8.0  NEUTROABS 4.9  --   --  4.7  HGB 13.1 13.1 12.6 12.5  HCT 43.2 43.5 40.8 40.2  MCV 84.5 85.3 83.4 83.8  PLT 274 247 226 239   Cardiac Enzymes: No results for input(s): CKTOTAL, CKMB, CKMBINDEX, TROPONINI in the last 168 hours. BNP: Invalid input(s): POCBNP CBG: Recent Labs  Lab 03/04/20 0729 03/04/20 1138 03/04/20 1540 03/04/20 2010 03/05/20 0740  GLUCAP 116* 103* 99 128* 109*   D-Dimer No results for input(s): DDIMER in the last 72 hours. Hgb A1c Recent Labs    03/03/20 0046  HGBA1C 5.9*   Lipid Profile No results for input(s): CHOL, HDL, LDLCALC, TRIG, CHOLHDL, LDLDIRECT in the last 72 hours. Thyroid function studies Recent Labs    03/03/20 0046  TSH 7.815*   Anemia work up No results for input(s): VITAMINB12, FOLATE, FERRITIN, TIBC, IRON, RETICCTPCT in the last 72 hours. Urinalysis    Component Value Date/Time   COLORURINE YELLOW 07/17/2015 [place] 07/17/2015 1234   LABSPEC 1.020 07/17/2015 1234   PHURINE 6.5 07/17/2015 1234   GLUCOSEU NEGATIVE 07/17/2015 1234   HGBUR 2+ (A) 07/17/2015 1234   HGBUR trace-intact 06/13/2008 [place]  07/17/2015 1234   KETONESUR NEGATIVE 07/17/2015 1234   PROTEINUR NEGATIVE 07/17/2015 1234   UROBILINOGEN 0.2 06/13/2008 1045   NITRITE NEGATIVE 07/17/2015 1234   LEUKOCYTESUR NEGATIVE  07/17/2015 1234   Sepsis Labs Invalid input(s): PROCALCITONIN,  WBC,  LACTICIDVEN Microbiology Recent Results (from the past 240 hour(s))  Respiratory Panel by RT PCR (Flu A&B, Covid) - Nasopharyngeal Swab     Status: None   Collection Time: 03/02/20 10:14 PM   Specimen: Nasopharyngeal Swab  Result Value Ref Range Status   SARS Coronavirus 2 by RT PCR NEGATIVE NEGATIVE Final    Comment: (NOTE) SARS-CoV-2 target nucleic acids are NOT DETECTED.  The SARS-CoV-2 RNA is generally detectable in upper respiratoy specimens during the acute phase of infection. The lowest concentration of SARS-CoV-2 viral copies this assay can detect is 131 copies/mL. A negative result does not preclude SARS-Cov-2 infection and should not be used as the sole basis for treatment or other patient management decisions. A negative result may occur with  improper specimen collection/handling, submission of specimen other than nasopharyngeal swab, presence of viral mutation(s) within the areas targeted by this assay, and inadequate number of viral copies (<131 copies/mL). A negative result must be combined with clinical observations, patient history, and epidemiological information. The expected result is Negative.  Fact Sheet for Patients:  PinkCheek.be  Fact Sheet for Healthcare Providers:  GravelBags.it  This test is no t yet approved or cleared by the [place] FDA and  has been authorized for detection and/or diagnosis of SARS-CoV-2 by FDA under an Emergency Use Authorization (EUA). This EUA will remain  in effect (meaning this test can be used) for the duration of the COVID-19 declaration under Section 564(b)(1) of the Act, 21 U.S.C. section 360bbb-3(b)(1),  unless the authorization is terminated or revoked sooner.     Influenza A by PCR NEGATIVE NEGATIVE Final   Influenza B by PCR NEGATIVE NEGATIVE Final    Comment: (NOTE) The Xpert Xpress SARS-CoV-2/FLU/RSV assay is intended as an aid in  the diagnosis of influenza from Nasopharyngeal swab specimens and  should not be used as a sole basis for treatment. Nasal washings and  aspirates are unacceptable for Xpert Xpress SARS-CoV-2/FLU/RSV  testing.  Fact Sheet for Patients: PinkCheek.be  Fact Sheet for Healthcare Providers: GravelBags.it  This test is not yet approved or cleared by the [place] FDA and  has been authorized for detection and/or diagnosis of SARS-CoV-2 by  FDA under an Emergency Use Authorization (EUA). This EUA will remain  in effect (meaning this test can be used) for the duration of the  Covid-19 declaration under Section 564(b)(1) of the Act, 21  U.S.C. section 360bbb-3(b)(1), unless the authorization is  terminated or revoked. Performed at [facility], [place] [address], [place], [place] [address]   MRSA PCR Screening     Status: None   Collection Time: 03/03/20 12:15 AM   Specimen: Nasal Mucosa; Nasopharyngeal  Result Value Ref Range Status   MRSA by PCR NEGATIVE NEGATIVE Final    Comment:        The GeneXpert MRSA Assay (FDA approved for NASAL specimens only), is one component of a comprehensive MRSA colonization surveillance program. It is not intended to diagnose MRSA infection nor to guide or monitor treatment for MRSA infections. Performed at [facility], [place] [address] [place], [place] [address]     Procedures/Studies: MM Breast Surgical Specimen  Addendum Date: 02/20/2020   ADDENDUM REPORT: 02/20/2020 10:34 ADDENDUM: In the Findings section, I inadvertently stated that the coil shaped tissue marker clip is in the specimen. Only the radioactive  seed is in the specimen, and the seed is intact. Electronically  Signed   By: Evangeline Dakin M.D.   On: 02/20/2020 10:34   Result Date: 02/20/2020 CLINICAL DATA:  Biopsy-proven DCIS involving the UPPER OUTER QUADRANT of the LEFT breast for which lumpectomy is performed. Radioactive seed localization was performed yesterday. EXAM: SPECIMEN RADIOGRAPH OF THE LEFT BREAST COMPARISON:  Previous exam(s). FINDINGS: Status post excision of the LEFT breast. The radioactive seed is present in a non-compressed specimen and is intact. The coil shaped tissue marker clip is in a second non-compressed specimen. This was discussed by telephone with the operating room nurse at the time of interpretation on 02/19/2020 at 2:07 p.m. IMPRESSION: Specimen radiograph of the LEFT breast. Electronically Signed: By: Evangeline Dakin M.D. On: 02/19/2020 14:12   ECHOCARDIOGRAM COMPLETE  Result Date: 03/04/2020    ECHOCARDIOGRAM REPORT   Patient Name:   Frances Hughes Date of Exam: 03/04/2020 Medical Rec #:  [id number]        Height:       63.0 in Accession #:    [id number]       Weight:       237.0 lb Date of Birth:  [birth year]        BSA:          2.078 m Patient Age:    64 years         BP:           168/84 mmHg Patient Gender: F                HR:           66 bpm. Exam Location:  Inpatient Procedure: 2D Echo, Color Doppler and Cardiac Doppler Indications:    I10 Hypertension  History:        Patient has prior history of Echocardiogram examinations, most                 recent 12/07/2017. Risk Factors:Hypertension, Diabetes,                 Dyslipidemia and Sleep Apnea.  Sonographer:    Raquel Sarna Senior RDCS Referring Phys: [place]  1. Left ventricular ejection fraction, by estimation, is 60 to 65%. The left ventricle has normal function. The left ventricle has no regional wall motion abnormalities. There is moderate concentric left ventricular hypertrophy. Left ventricular diastolic parameters are consistent with  Grade I diastolic dysfunction (impaired relaxation). LV filling pressures are elevated with E/E' 20.  2. Right ventricular systolic function is normal. The right ventricular size is normal. There is normal pulmonary artery systolic pressure.  3. Left atrial size was moderately dilated.  4. The mitral valve is normal in structure. Trivial mitral valve regurgitation. Moderate mitral annular calcification.  5. The aortic valve is tricuspid. There is mild calcification of the aortic valve. There is mild thickening of the aortic valve. Aortic valve regurgitation is not visualized. Mild aortic valve sclerosis is present, with no evidence of aortic valve stenosis.  6. Aortic dilatation noted. There is mild dilatation of the ascending aorta, measuring 38 mm.  7. The inferior vena cava is normal in size with greater than 50% respiratory variability, suggesting right atrial pressure of 3 mmHg. Comparison(s): No significant change from prior study. FINDINGS  Left Ventricle: Left ventricular ejection fraction, by estimation, is 60 to 65%. The left ventricle has normal function. The left ventricle has no regional wall motion abnormalities. The left ventricular internal cavity size was normal in size. There is  moderate concentric left ventricular  hypertrophy. Left ventricular diastolic parameters are consistent with Grade I diastolic dysfunction (impaired relaxation). LV filling pressures are elevated with E/E' 20.V filling pressures are elevated with E/E' 20. V filling pressures are elevated with E/E' 20. Right Ventricle: The right ventricular size is normal. Right vetricular wall thickness was not assessed. Right ventricular systolic function is normal. There is normal pulmonary artery systolic pressure. The tricuspid regurgitant velocity is 2.14 m/s, and with an assumed right atrial pressure of 3 mmHg, the estimated right ventricular systolic pressure is 32.4 mmHg. Left Atrium: Left atrial size was moderately dilated. Right  Atrium: Right atrial size was normal in size. Pericardium: There is no evidence of pericardial effusion. Mitral Valve: The mitral valve is normal in structure. There is mild thickening of the mitral valve leaflet(s). There is mild calcification of the mitral valve leaflet(s). Moderate mitral annular calcification. Trivial mitral valve regurgitation. Tricuspid Valve: The tricuspid valve is grossly normal. Tricuspid valve regurgitation is trivial. Aortic Valve: The aortic valve is tricuspid. There is mild calcification of the aortic valve. There is mild thickening of the aortic valve. Aortic valve regurgitation is not visualized. Mild aortic valve sclerosis is present, with no evidence of aortic valve stenosis. Pulmonic Valve: The pulmonic valve was normal in structure. Pulmonic valve regurgitation is not visualized. Aorta: Aortic dilatation noted. There is mild dilatation of the ascending aorta, measuring 38 mm. Venous: The inferior vena cava is normal in size with greater than 50% respiratory variability, suggesting right atrial pressure of 3 mmHg. IAS/Shunts: No atrial level shunt detected by color flow Doppler.  LEFT VENTRICLE PLAX 2D LVIDd:         4.80 cm  Diastology LVIDs:         3.00 cm  LV e' medial:    4.46 cm/s LV PW:         1.50 cm  LV E/e' medial:  20.8 LV IVS:        1.50 cm  LV e' lateral:   4.79 cm/s LVOT diam:     2.00 cm  LV E/e' lateral: 19.4 LV SV:         64 LV SV Index:   31 LVOT Area:     3.14 cm  RIGHT VENTRICLE RV S prime:     12.20 cm/s TAPSE (M-mode): 1.9 cm LEFT ATRIUM             Index       RIGHT ATRIUM           Index LA diam:        3.40 cm 1.64 cm/m  RA Area:     16.60 cm LA Vol (A2C):   73.7 ml 35.46 ml/m RA Volume:   42.90 ml  20.64 ml/m LA Vol (A4C):   86.8 ml 41.77 ml/m LA Biplane Vol: 83.5 ml 40.18 ml/m  AORTIC VALVE LVOT Vmax:   93.25 cm/s LVOT Vmean:  68.550 cm/s LVOT VTI:    0.204 m  AORTA Ao Root diam: 3.20 cm Ao Asc diam:  3.80 cm MITRAL VALVE                 TRICUSPID VALVE MV Area (PHT): 2.66 cm     TR Peak grad:   18.3 mmHg MV Decel Time: 285 msec     TR Vmax:        214.00 cm/s MV E velocity: 92.70 cm/s MV A velocity: 127.00 cm/s  SHUNTS MV E/A ratio:  0.73  Systemic VTI:  0.20 m                             Systemic Diam: 2.00 cm Gwyndolyn Kaufman MD Electronically signed by Gwyndolyn Kaufman MD Signature Date/Time: 03/04/2020/4:26:09 PM    Final    MM LT RADIOACTIVE SEED LOC MAMMO GUIDE  Result Date: 02/18/2020 CLINICAL DATA:  64 year old with biopsy-proven DCIS involving the UPPER OUTER QUADRANT of the LEFT breast. Radioactive seed localization is performed in anticipation of lumpectomy which is scheduled for tomorrow. Of note, the coil shaped tissue marker clip placed at the time of stereotactic biopsy migrated approximately 4 cm MEDIAL to the calcifications. Therefore, the calcifications are localized. EXAM: MAMMOGRAPHIC GUIDED RADIOACTIVE SEED LOCALIZATION OF THE LEFT BREAST COMPARISON:  Previous exam(s). FINDINGS: Patient presents for radioactive seed localization prior to LEFT breast lumpectomy. I met with the patient and we discussed the procedure of seed localization including benefits and alternatives. We discussed the high likelihood of a successful procedure. We discussed the risks of the procedure including infection, bleeding, tissue injury and further surgery. We discussed the low dose of radioactivity involved in the procedure. Informed, written consent was given. The usual time-out protocol was performed immediately prior to the procedure. Using mammographic guidance, sterile technique with chlorhexidine as skin antisepsis, 1% lidocaine as local anesthetic, an I-125 radioactive seed was used to localize the residual calcifications in the UPPER OUTER QUADRANT of the LEFT breast at posterior depth using a superior approach, after we were unable to approach the calcifications laterally. The follow-up mammogram images confirm that the seed is  appropriately position adjacent to the residual calcifications. The images are marked for Dr. Ninfa Linden. Follow-up survey of the patient confirms the presence of the radioactive seed. Order number of I-125 seed: [id number] Total activity: 0.254 mCi Reference Date: 12/23/2019 The patient tolerated the procedure well and was released from the [facility]. She was given instructions regarding seed removal. 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 No apparent complications. Electronically Signed   By: Evangeline Dakin M.D.   On: 02/18/2020 15:47     Time coordinating discharge: Over 30 minutes    Dwyane Dee, MD  [facility] 03/05/2020, 3:20 PM

## 2020-03-06 ENCOUNTER — Encounter: Payer: Self-pay | Admitting: Family Medicine

## 2020-03-06 ENCOUNTER — Ambulatory Visit (INDEPENDENT_AMBULATORY_CARE_PROVIDER_SITE_OTHER): Payer: Medicare Other | Admitting: Family Medicine

## 2020-03-06 ENCOUNTER — Other Ambulatory Visit: Payer: Self-pay

## 2020-03-06 VITALS — BP 164/92 | HR 90 | Temp 97.8°F | Resp 18 | Ht 63.0 in | Wt 256.0 lb

## 2020-03-06 DIAGNOSIS — E269 Hyperaldosteronism, unspecified: Secondary | ICD-10-CM | POA: Diagnosis not present

## 2020-03-06 DIAGNOSIS — I10 Essential (primary) hypertension: Secondary | ICD-10-CM | POA: Diagnosis not present

## 2020-03-06 DIAGNOSIS — G4733 Obstructive sleep apnea (adult) (pediatric): Secondary | ICD-10-CM | POA: Diagnosis not present

## 2020-03-06 DIAGNOSIS — E278 Other specified disorders of adrenal gland: Secondary | ICD-10-CM

## 2020-03-06 MED ORDER — FUROSEMIDE 20 MG PO TABS: ORAL_TABLET | ORAL | 3 refills | Status: DC

## 2020-03-06 NOTE — Progress Notes (Signed)
   Subjective:    [name], female    DOB: 1955/09/20, 64 y.o.   MRN: [id number]  Patient presents for Hospital F/U  Patient here for hospital follow-up.  She was admitted to the hospital secondary to hypertensive urgency.  She presented to her oncology office for follow-up she is currently undergoing breast cancer treatment.  She is status post lumpectomy and is supposed to start radiation therapy.  She been taking her blood pressure medication as prescribed.  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  She had repeat work-up to see if this was active contributing to her blood pressure.  She had work-up done 10 years ago was found to be benign. Her cortisol level was normal.  Renin aldosterone and catocholaemines are still pending   Discharge medications for blood pressure included amlodipine 10 mg daily hydralazine 50 mg 3 times a day and losartan 100 mg daily The echo did not show any acute CHF.  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  Diabetes mellitus type II this was controlled in the hospital A1c was 5.9%, taking metformin  500mg  BID  Hyperlipidemia - she is not on statin, she stopped taking after her cancer diagnosis  Hypokalemia- she has potassuun   Chronic back pain- has gabaptin, ultram on hand, using tylenol   No new concerns today   Review Of Systems:  GEN- denies fatigue, fever, weight loss,weakness, recent illness HEENT- denies eye drainage, change in vision, nasal discharge, CVS- denies chest pain, palpitations RESP- denies SOB, cough, wheeze ABD- denies N/V, change in stools, abd pain GU- denies dysuria, hematuria, dribbling, incontinence MSK- denies joint pain, muscle aches, injury Neuro- denies headache, dizziness,  syncope, seizure activity       Objective:    BP (!) 164/92 (BP Location: Left Arm, Patient Position: Sitting, Cuff Size: Large)   Pulse 90   Temp 97.8 F (36.6 C) (Temporal)   Resp 18   Ht 5\' 3"  (1.6 m)   Wt 256 lb (116.1 kg)   SpO2 95%   BMI 45.35 kg/m  GEN- NAD, alert and oriented x3 HEENT- PERRL, EOMI, non injected sclera, pink conjunctiva, MMM, oropharynx clear Neck- Supple, no thyromegaly CVS- RRR, 3 /6 systolic murmur RESP-CTAB ABD-NABS,soft, NT,ND EXT- 1+ nonpitting edema L >R Pulses- Radial, DP-palpated         Assessment & Plan:      Problem List Items Addressed This Visit      Unprioritized   Adrenal mass, left ([place])    F/u labs      Essential hypertension    BP is improving Adrenal labs are still pending She is not symptomatic She will monitor BP at home If it continues to stay elevated, will increase hydralazine BB and clonidine were stopped in the hospital      Relevant Medications   furosemide (LASIX) 20 MG tablet   OSA (obstructive sleep apnea)    Again discussed importance of treating OSA in setting of her HTN, obesity         Note: This dictation was prepared with Dragon dictation along with smaller phrase technology. Any transcriptional errors that result from this process are unintentional.

## 2020-03-06 NOTE — Patient Instructions (Addendum)
F/U as previous  Check your blood pressure if staying > 307 systolic / 90 diastolic

## 2020-03-07 LAB — METANEPHRINES, URINE, 24 HOUR
Metaneph Total, Ur: 136 ug/L
Metanephrines, 24H Ur: 163 ug/24 hr (ref 36–209)
Normetanephrine, 24H Ur: 320 ug/24 hr (ref 131–612)
Normetanephrine, Ur: 267 ug/L
Total Volume: 1200

## 2020-03-08 ENCOUNTER — Encounter: Payer: Self-pay | Admitting: Family Medicine

## 2020-03-08 NOTE — Assessment & Plan Note (Signed)
Again discussed importance of treating OSA in setting of her HTN, obesity

## 2020-03-08 NOTE — Assessment & Plan Note (Signed)
Fu labs

## 2020-03-08 NOTE — Assessment & Plan Note (Signed)
BP is improving Adrenal labs are still pending She is not symptomatic She will monitor BP at home If it continues to stay elevated, will increase hydralazine BB and clonidine were stopped in the hospital

## 2020-03-09 LAB — ALDOSTERONE + RENIN ACTIVITY W/ RATIO
ALDO / PRA Ratio: >49.1 — ABNORMAL HIGH (ref 0.0–30.0)
Aldosterone: 8.2 ng/dL (ref 0.0–30.0)
PRA LC/MS/MS: <0.167 ng/mL/hr — ABNORMAL LOW (ref 0.167–5.380)

## 2020-03-09 MED ORDER — SPIRONOLACTONE 25 MG PO TABS: 12.5000 mg | ORAL_TABLET | Freq: Every day | ORAL | 2 refills | Status: DC

## 2020-03-09 NOTE — Addendum Note (Signed)
Addended by: Vic Blackbird F on: 03/09/2020 03:17 PM   Modules accepted: Orders

## 2020-03-09 NOTE — Addendum Note (Signed)
Addended by: Vic Blackbird F on: 03/09/2020 03:24 PM   Modules accepted: Orders

## 2020-03-12 ENCOUNTER — Other Ambulatory Visit: Payer: Self-pay | Admitting: Family Medicine

## 2020-03-14 ENCOUNTER — Other Ambulatory Visit: Payer: Self-pay | Admitting: Family Medicine

## 2020-03-16 ENCOUNTER — Other Ambulatory Visit: Payer: Self-pay | Admitting: Oncology

## 2020-03-16 NOTE — Progress Notes (Signed)
  During [facility] hospitalization in October 2021 she was worked up for possible causes of her refractory hypertension.  The following will be added to her problem list:  (X) left adrenal adenoma  (a) CT of the abdomen 10/10/2014 shows no significant change as compared to 04/03/2006  (b) AM cortisol 03/04/2020 unremarkable  (c) 24-hour urine metanephrines 03/04/2020 within normal limits  (d) aldosterone renin ratio elevated 03/09/2020--referred to endocrine for further evaluation (Dr Bud Face)  (e) MRI of the abdomen 03/19/2020

## 2020-03-17 ENCOUNTER — Other Ambulatory Visit: Payer: Self-pay

## 2020-03-17 ENCOUNTER — Ambulatory Visit: Payer: Medicare Other | Admitting: "Endocrinology

## 2020-03-17 ENCOUNTER — Encounter: Payer: Self-pay | Admitting: "Endocrinology

## 2020-03-17 VITALS — BP 144/88 | HR 92 | Ht 63.0 in | Wt 257.2 lb

## 2020-03-17 DIAGNOSIS — D3502 Benign neoplasm of left adrenal gland: Secondary | ICD-10-CM

## 2020-03-17 MED ORDER — CLONIDINE HCL 0.2 MG PO TABS: 0.2000 mg | ORAL_TABLET | Freq: Two times a day (BID) | ORAL | 3 refills | Status: DC

## 2020-03-17 NOTE — Progress Notes (Signed)
Endocrinology Consult Note                                            03/17/2020, 5:44 PM   Subjective:    [name], female    DOB: 11-29-1955, PCP Alycia Rossetti, MD   Past Medical History:  Diagnosis Date  . Adrenal mass, left ([place])   . DM (diabetes mellitus) ([name])    Type II controlled  . Enlarged heart   . Grave's disease   . HTN (hypertension)   . Hyperlipidemia   . Lymphocytosis   . Nephrolithiasis   . OSA on CPAP    Past Surgical History:  Procedure Laterality Date  . ABDOMINAL HYSTERECTOMY    . BREAST BIOPSY Left 01/06/2020  . BREAST LUMPECTOMY WITH RADIOACTIVE SEED LOCALIZATION Left 02/19/2020   Procedure: LEFT BREAST LUMPECTOMY WITH RADIOACTIVE SEED LOCALIZATION;  Surgeon: [name], MD;  Location: [facility];  Service: General;  Laterality: Left;  . COLONOSCOPY  2011   normal colonoscopy  . COLONOSCOPY WITH PROPOFOL N/A 04/16/2018   Procedure: COLONOSCOPY WITH PROPOFOL;  Surgeon: Daneil Dolin, MD;  [facility];  Service: Endoscopy;  Laterality: N/A;  9:45am  . HERNIA REPAIR  2011  . LITHOTRIPSY    . POLYPECTOMY  04/16/2018   Procedure: POLYPECTOMY;  Surgeon: Daneil Dolin, MD;  [facility];  Service: Endoscopy;;  (colon)  . S/P Hysterectomy  10/1996   with bilat SOO seconardt to fibroids  . thyroid ablation  1986   Social History   Socioeconomic History  . Marital status: Married    Spouse name: Not on file  . Number of children: Not on file  . Years of education: Not on file  . Highest education level: Not on file  Occupational History  . Not on file  Tobacco Use  . Smoking status: Former Smoker    Packs/day: 0.75    Years: 10.00    Pack years: 7.50    Types: Cigarettes    Start date: 01/30/1969    Quit date: 05/23/1988    Years since quitting: 31.8  . Smokeless tobacco: Never Used  Substance and Sexual Activity  . Alcohol use: No    Alcohol/week: 0.0 standard  drinks  . Drug use: No  . Sexual activity: Yes  Other Topics Concern  . Not on file  Social History Narrative  . Not on file   Social Determinants of Health   Financial Resource Strain:   . Difficulty of Paying Living Expenses: Not on file  Food Insecurity:   . Worried About Charity fundraiser in the Last Year: Not on file  . Ran Out of Food in the Last Year: Not on file  Transportation Needs:   . Lack of Transportation (Medical): Not on file  . Lack of Transportation (Non-Medical): Not on file  Physical Activity:   . Days of Exercise per Week: Not on file  . Minutes of Exercise per Session: Not on file  Stress:   . Feeling of Stress : Not on file  Social Connections:   . Frequency of Communication with Friends and Family: Not on file  . Frequency of Social Gatherings with Friends and Family: Not on file  . Attends Religious Services: Not on file  . Active Member of Clubs  or Organizations: Not on file  . Attends Archivist Meetings: Not on file  . Marital Status: Not on file   Family History  Problem Relation Age of Onset  . Colon cancer Mother   . Colon cancer Sister   . Liver cancer Sister   . Diabetes Father   . Hypertension Father   . Cancer Father   . Cancer Sister        Colon cancer   Outpatient Encounter Medications as of 03/17/2020  Medication Sig  . furosemide (LASIX) 20 MG tablet Take 20 mg by mouth daily with breakfast.  . acetaminophen (TYLENOL) 650 MG CR tablet Take 1,300 mg by mouth 2 (two) times daily as needed for pain.  Marland Kitchen atorvastatin (LIPITOR) 40 MG tablet TAKE 1 TABLET(40 MG) BY MOUTH DAILY  . Blood Glucose Monitoring Suppl (BLOOD GLUCOSE MONITOR SYSTEM) W/DEVICE KIT Please dispense based on patient and insurance preference. Use to monitor fasting blood sugar 1x daily. Dx: E11.9  . cloNIDine (CATAPRES) 0.2 MG tablet Take 1 tablet (0.2 mg total) by mouth 2 (two) times daily.  . Glucose Blood (BLOOD GLUCOSE TEST STRIPS) STRP Please  dispense based on patient and insurance preference. Use to monitor fasting blood sugar 1x daily. Dx: E11.9  . hydrALAZINE (APRESOLINE) 50 MG tablet Take 1 tablet (50 mg total) by mouth 3 (three) times daily.  . Lancets 28G MISC Please dispense based on patient and insurance preference. Use to monitor fasting blood sugar 1x daily. Dx: E11.9  . levothyroxine (SYNTHROID) 200 MCG tablet TAKE 1 TABLET BY MOUTH EVERY DAY BEFORE BREAKFAST (Patient taking differently: Take 200 mcg by mouth daily before breakfast. )  . losartan (COZAAR) 100 MG tablet Take 1 tablet (100 mg total) by mouth daily.  . metFORMIN (GLUCOPHAGE) 500 MG tablet TAKE 1 TABLET BY MOUTH TWICE DAILY WITH A MEAL (Patient taking differently: Take 500 mg by mouth 2 (two) times daily with a meal. )  . Multiple Vitamins-Minerals (CENTRUM SILVER PO) Take 1 tablet by mouth daily.   . potassium chloride SA (K-DUR,KLOR-CON) 20 MEQ tablet Take 1 tablet (20 mEq total) by mouth daily.  Marland Kitchen spironolactone (ALDACTONE) 25 MG tablet Take 0.5 tablets (12.5 mg total) by mouth daily.  . [DISCONTINUED] amLODipine (NORVASC) 10 MG tablet Take 1 tablet (10 mg total) by mouth daily.  . [DISCONTINUED] furosemide (LASIX) 20 MG tablet Take 1 tablet daily prn  . [DISCONTINUED] gabapentin (NEURONTIN) 300 MG capsule Take 300 mg by mouth 3 (three) times daily as needed (pain).   . [DISCONTINUED] naproxen (NAPROSYN) 500 MG tablet Take 1 tablet (500 mg total) by mouth daily as needed for mild pain.  . [DISCONTINUED] traMADol (ULTRAM) 50 MG tablet Take 1 tablet (50 mg total) by mouth every 6 (six) hours as needed for moderate pain or severe pain.   No facility-administered encounter medications on file as of 03/17/2020.   ALLERGIES: No Known Allergies  VACCINATION STATUS: Immunization History  Administered Date(s) Administered  . Fluad Quad(high Dose 65+) 02/08/2019, 03/05/2020  . Influenza-Unspecified 03/21/2011, 03/20/2015  . Moderna SARS-COVID-2 Vaccination  07/08/2019, 07/31/2019  . Pneumococcal Polysaccharide-23 04/13/2007, 03/05/2020  . Tdap 09/14/2012    HPI Frances Hughes is 64 y.o. female who presents today with a medical history as above. she is being seen in consultation for left adrenal adenoma requested by Alycia Rossetti, MD. She has a long and complicated medical history.  History is obtained directly from the patient and review of her medical records.  she has history of hypertension on multiple medications.  She also has history of Graves' disease which required radioactive iodine thyroid ablation in 2006.  She is currently on levothyroxine 200 mcg p.o. daily.  She reports compliance to this medication.   She is known to have left adrenal mass at least since September 2005.  She has a series of abdominal and thoracic imaging studies since then and it was documented that this lesion was stable and displays features consistent with benign adenoma.  During her last abdominal CT in 2016 it measured 3.5 cm (3.2 cm in 2007) and the lesion demonstrated washout characteristics consistent with benign adrenal adenoma with absolute washout of 93%.  The right adrenal glands were normal. Over the years, she underwent various endocrine work-up for this adenoma and none of them were significant for functioning lesion. Most recently in October 2021 she underwent 24-hour urine collection for metanephrines which were normal.  Along with her hypertension, patient has chronic hypokalemia which required intermittent supplement with potassium. Currently she is on amlodipine 10 mg p.o. daily, hydralazine 50 mg 3 times daily, losartan 100 mg p.o. daily with suboptimal control of blood pressure.  She is also on potassium chloride 20 mEq p.o. daily.  She is on spironolactone 12.5 mg daily.  She was on Lasix until recently when she was advised to discontinue.  The maximum Lasix she took was 20 mg daily. She has well-controlled type 2 diabetes with recent A1c of  5.9%, currently on Metformin 500 mg p.o. twice daily. She is recently diagnosed with breast cancer being prepared for chemo, surgery.  She was in the hospital in March 03, 2020, while on diuretics, spironolactone she underwent aldosterone, plasma renin activity measurements.  Aldosterone was 8.2, plasma renin activity was suppressed at 0.167 which led to a low/PRA ratio  > 49.1.  A.m. cortisol on March 04, 2020 was 12.7-normal.  Patient describes chronic fatigue, exertional dyspnea, inability to lose weight/progressive weight gain. She does not consume licorice, no family history of premature coronary artery disease, CVA, adrenal, thyroid, parathyroid, pituitary dysfunctions.  Review of Systems  Constitutional: + Emily fluctuating body weight, + fatigue, no subjective hyperthermia, no subjective hypothermia Eyes: no blurry vision, no xerophthalmia ENT: no sore throat, no nodules palpated in throat, no dysphagia/odynophagia, no hoarseness Cardiovascular: no Chest Pain, no Shortness of Breath, no palpitations, no leg swelling Respiratory: no cough, no shortness of breath Gastrointestinal: no Nausea/Vomiting/Diarhhea Musculoskeletal: no muscle/joint aches Skin: no rashes Neurological: no tremors, no numbness, no tingling, no dizziness Psychiatric: no depression, no anxiety  Objective:    Vitals with BMI 03/17/2020 03/06/2020 03/06/2020  Height '5\' 3"'  - '5\' 3"'   Weight 257 lbs 3 oz - 256 lbs  BMI 50.56 - 97.94  Systolic 801 655 374  Diastolic 88 92 84  Pulse 92 - 90    BP (!) 144/88   Pulse 92   Ht '5\' 3"'  (1.6 m)   Wt 257 lb 3.2 oz (116.7 kg)   BMI 45.56 kg/m   Wt Readings from Last 3 Encounters:  03/17/20 257 lb 3.2 oz (116.7 kg)  03/06/20 256 lb (116.1 kg)  03/02/20 237 lb (107.5 kg)    Physical Exam  Constitutional:  Body mass index is 45.56 kg/m.,  not in acute distress, normal state of mind Eyes: PERRLA, EOMI, no exophthalmos ENT: moist mucous membranes, no gross  thyromegaly, no gross cervical lymphadenopathy Cardiovascular: normal precordial activity, Regular Rate and Rhythm, no Murmur/Rubs/Gallops Respiratory:  adequate breathing efforts, no  gross chest deformity, Clear to auscultation bilaterally Gastrointestinal: abdomen soft, Non -tender, No distension, Bowel Sounds present, no gross organomegaly Musculoskeletal:  + Edema, no gross deformities, strength intact in all four extremities Skin: moist, warm, no rashes Neurological: no tremor with outstretched hands, Deep tendon reflexes normal in bilateral lower extremities.  CMP ( most recent) CMP     Component Value Date/Time   NA 142 03/05/2020 0255   K 3.1 (L) 03/05/2020 0255   CL 104 03/05/2020 0255   CO2 28 03/05/2020 0255   GLUCOSE 111 (H) 03/05/2020 0255   BUN 19 03/05/2020 0255   CREATININE 0.79 03/05/2020 0255   CREATININE 0.74 03/02/2020 1526   CREATININE 0.88 12/18/2019 0847   CALCIUM 8.7 (L) 03/05/2020 0255   PROT 7.7 03/03/2020 0046   ALBUMIN 3.7 03/03/2020 0046   AST 17 03/03/2020 0046   AST 17 03/02/2020 1526   ALT 14 03/03/2020 0046   ALT 13 03/02/2020 1526   ALKPHOS 70 03/03/2020 0046   BILITOT 0.9 03/03/2020 0046   BILITOT 0.3 03/02/2020 1526   GFRNONAA >60 03/05/2020 0255   GFRNONAA >60 03/02/2020 1526   GFRNONAA 94 11/07/2018 0927   GFRAA >60 02/17/2020 0930   GFRAA 109 11/07/2018 0927     Diabetic Labs (most recent): Lab Results  Component Value Date   HGBA1C 5.9 (H) 03/03/2020   HGBA1C 6.0 (H) 12/18/2019   HGBA1C 6.2 (H) 09/09/2019     Lipid Panel ( most recent) Lipid Panel     Component Value Date/Time   CHOL 175 12/18/2019 0847   TRIG 69 12/18/2019 0847   HDL 57 12/18/2019 0847   CHOLHDL 3.1 12/18/2019 0847   VLDL 21 09/09/2016 1311   LDLCALC 102 (H) 12/18/2019 0847   LDLDIRECT 110 (H) 01/21/2012 0950      Lab Results  Component Value Date   TSH 7.815 (H) 03/03/2020   TSH 1.07 12/18/2019   TSH 4.84 (H) 09/09/2019   TSH 0.08 (L)  02/08/2019   TSH 8.47 (H) 11/07/2018   TSH 0.20 (L) 03/23/2018   TSH 1.86 12/15/2017   TSH 13.29 (H) 10/13/2017   TSH 8.86 (H) 08/21/2017   TSH 1.27 02/13/2017   FREET4 1.13 (H) 03/04/2020   FREET4 1.7 03/23/2018   FREET4 1.2 12/15/2017   FREET4 0.9 10/13/2017   FREET4 0.9 08/21/2017   FREET4 1.3 02/13/2017   FREET4 1.2 09/09/2016   FREET4 1.3 06/03/2016   FREET4 1.0 03/08/2016   FREET4 0.8 01/29/2016        Results for OZA, OBERLE (MRN [id number]) as of 03/18/2020 21:02  Ref. Range 03/03/2020 16:36 03/04/2020 02:49  ALDOSTERONE Latest Ref Range: 0.0 - 30.0 ng/dL 8.2   PRA LC/MS/MS Latest Ref Range: 0.167 - 5.380 ng/mL/hr <0.167 (L)   ALDO / PRA Ratio Latest Ref Range: 0.0 - 30.0  >49.1 (H)   Cortisol - AM Latest Ref Range: 6.7 - 22.6 ug/dL  12.7     Assessment & Plan:   1. Adrenal adenoma, left  - Frances Hughes  is being seen at a kind request of Eaton, Modena Nunnery, MD. - I have reviewed her available endocrine records and clinically evaluated the patient. - Based on these reviews, she has nonfunctioning, stable left adrenal adenoma which did not contribute for her history of hypertension.  This lesion was documented to be stable since 2005, will not need surgical intervention at this time.  Her hypokalemia seems to be diuretic related. She does not have paroxysmal symptoms  to suggest pheochromocytoma.  Her recent work-up rules out  Cushing syndrome. Her recent work-up with aldosterone, plasma renin activity are not conclusive for primary hyperaldosteronism. She will need better management of hypertension.  I discussed and discontinued amlodipine.  She is going to be continued on losartan 100 mg daily, hydralazine 50 mg p.o. 3 times daily, added clonidine 0.2 mg p.o. twice daily. She is advised to continue potassium 20 mEq daily, resume Lasix 20 mg p.o. daily given her edema.    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 daily before breakfast.  - We discussed about the correct intake of her thyroid hormone, on empty stomach at fasting, with water, separated by at least 30 minutes from breakfast and other medications,  and separated by more than 4 hours from calcium, iron, multivitamins, acid reflux medications (PPIs). -Patient is made aware of the fact that thyroid hormone replacement is needed for life, dose to be adjusted by periodic monitoring of thyroid function tests.  In 3-4 weeks, she will have repeat CMP, plasma metanephrines, thyroid function tests and office visit.  If labs are suggestive, she will be considered for work-up for primary hyperaldosteronism again. -She will be assessed for any need of imaging of the left adrenal adenoma after her next visit.  - she is advised to maintain close follow up with Alycia Rossetti, MD for primary care needs.   - Time spent with the patient: 60 minutes, of which >50% was spent in  counseling her about her left adrenal neuroma, RAI induced hypothyroidism, hypertension and the rest in obtaining information about her symptoms, reviewing her previous labs/studies ( including abstractions from other facilities),  evaluations, and treatments,  and developing a plan to confirm diagnosis and long term treatment based on the latest standards of care/guidelines; and documenting her care.  Frances Hughes participated in the discussions, expressed understanding, and voiced agreement with the above plans.  All questions were answered to her satisfaction. she is encouraged to contact clinic should she have any questions or concerns prior to her return visit.  Follow up plan: Return in about 4 weeks (around 04/14/2020) for F/U with Pre-visit Labs.   Glade Lloyd, MD [facility] [address] [place], [place] [address] Phone: [phone]  Fax: [phone]     03/17/2020, 5:44 PM  This note was  partially dictated with voice recognition software. Similar sounding words can be transcribed inadequately or may not  be corrected upon review.

## 2020-03-19 ENCOUNTER — Ambulatory Visit: Payer: Medicare Other

## 2020-03-19 ENCOUNTER — Ambulatory Visit: Payer: Medicare Other | Admitting: Radiation Oncology

## 2020-03-19 ENCOUNTER — Encounter: Payer: Self-pay | Admitting: Radiation Oncology

## 2020-03-19 ENCOUNTER — Ambulatory Visit
Admission: RE | Admit: 2020-03-19 | Discharge: 2020-03-19 | Disposition: A | Discharge status: Home / Self Care | Payer: Medicare Other | Source: Ambulatory Visit | Attending: Radiation Oncology | Admitting: Radiation Oncology

## 2020-03-19 ENCOUNTER — Other Ambulatory Visit: Payer: Self-pay

## 2020-03-19 ENCOUNTER — Ambulatory Visit (HOSPITAL_COMMUNITY)
Admission: RE | Admit: 2020-03-19 | Discharge: 2020-03-19 | Disposition: A | Discharge status: Home / Self Care | Payer: Medicare Other | Source: Ambulatory Visit | Attending: Family Medicine | Admitting: Family Medicine

## 2020-03-19 VITALS — BP 164/98 | HR 60 | Temp 98.5°F | Resp 18 | Wt 256.1 lb

## 2020-03-19 DIAGNOSIS — E278 Other specified disorders of adrenal gland: Secondary | ICD-10-CM | POA: Insufficient documentation

## 2020-03-19 DIAGNOSIS — Z9889 Other specified postprocedural states: Secondary | ICD-10-CM | POA: Diagnosis not present

## 2020-03-19 DIAGNOSIS — I10 Essential (primary) hypertension: Secondary | ICD-10-CM

## 2020-03-19 DIAGNOSIS — D0512 Intraductal carcinoma in situ of left breast: Secondary | ICD-10-CM | POA: Diagnosis not present

## 2020-03-19 DIAGNOSIS — N281 Cyst of kidney, acquired: Secondary | ICD-10-CM | POA: Diagnosis not present

## 2020-03-19 DIAGNOSIS — K760 Fatty (change of) liver, not elsewhere classified: Secondary | ICD-10-CM | POA: Diagnosis not present

## 2020-03-19 DIAGNOSIS — Z170 Estrogen receptor positive status [ER+]: Secondary | ICD-10-CM | POA: Diagnosis not present

## 2020-03-19 HISTORY — DX: Other intervertebral disc degeneration, lumbosacral region: M51.37

## 2020-03-19 HISTORY — DX: Other intervertebral disc degeneration, lumbosacral region without mention of lumbar back pain or lower extremity pain: M51.379

## 2020-03-19 MED ORDER — GADOBUTROL 1 MMOL/ML IV SOLN
10.0000 mL | Freq: Once | INTRAVENOUS | Status: AC | PRN
  Administered 2020-03-19: 10 mL via INTRAVENOUS

## 2020-03-19 NOTE — Progress Notes (Signed)
Radiation Oncology         [phone] ________________________________   Name: Frances Hughes        MRN: [id number]  Date of Service: 03/19/2020 DOB: 07/15/1955  HD:[id number], Modena Nunnery, MD  Magrinat, Virgie Dad, MD     REFERRING PHYSICIAN: Magrinat, Virgie Dad, MD   DIAGNOSIS: The encounter diagnosis was Ductal carcinoma in situ (DCIS) of left breast.   HISTORY OF PRESENT ILLNESS: Frances Hughes is a 64 y.o. female with a new diagnosis of left breast cancer. The patient was noted to have a screening detected calcifications in the left breast in the upper outer quadrant measuring approximately 1.5 cm in greatest dimension. She underwent a stereotactic biopsy on 01/06/2020 which revealed an ER/PR positive low-grade DCIS with calcifications.  She met with Dr. Ninfa Linden and proceeded with lumpectomy of the left breast on 02/19/2020.  Final pathology revealed a low to intermediate grade DCIS measuring 1.5 cm with atypical lobular hyperplasia and fibrocystic change.  Her closest margin was 2 mm from the superior aspect.  She has done well postoperatively but did have vital signs a few weeks ago of concern with a systolic blood pressure over 200.  She was sent to the emergency room and hospitalized, work-up has continued including evaluation of an adrenal adenoma, she had an MRI scan yesterday that showed some concerns for benign adenoma.  She is also meeting with Dr. Laverta Baltimore in endocrinology for further work-up of this.  Fortunately with some changes in her medication her blood pressure has been much more controlled and is been in the 989Q to 119E systolic.  She is seen today to discuss moving forward with adjuvant therapy for her breast cancer.    PREVIOUS RADIATION THERAPY: No   PAST MEDICAL HISTORY:  Past Medical History:  Diagnosis Date  . Adrenal mass, left ([place])   . Degenerative disc disease at L5-S1 level   . DM (diabetes mellitus) ([place])    Type II controlled  . Enlarged heart   . Grave's  disease   . HTN (hypertension)   . Hyperlipidemia   . Lymphocytosis   . Nephrolithiasis   . OSA on CPAP        PAST SURGICAL HISTORY: Past Surgical History:  Procedure Laterality Date  . ABDOMINAL HYSTERECTOMY    . BREAST BIOPSY Left 01/06/2020  . BREAST LUMPECTOMY WITH RADIOACTIVE SEED LOCALIZATION Left 02/19/2020   Procedure: LEFT BREAST LUMPECTOMY WITH RADIOACTIVE SEED LOCALIZATION;  Surgeon: [name], MD;  Location: [facility];  Service: General;  Laterality: Left;  . COLONOSCOPY  2011   normal colonoscopy  . COLONOSCOPY WITH PROPOFOL N/A 04/16/2018   Procedure: COLONOSCOPY WITH PROPOFOL;  Surgeon: Daneil Dolin, MD;  [facility];  Service: Endoscopy;  Laterality: N/A;  9:45am  . HERNIA REPAIR  2011  . LITHOTRIPSY    . POLYPECTOMY  04/16/2018   Procedure: POLYPECTOMY;  Surgeon: Daneil Dolin, MD;  [facility]: [facility];  Service: Endoscopy;;  (colon)  . S/P Hysterectomy  10/1996   with bilat SOO seconardt to fibroids  . thyroid ablation  1986     FAMILY HISTORY:  Family History  Problem Relation Age of Onset  . Colon cancer Mother   . Colon cancer Sister   . Liver cancer Sister   . Diabetes Father   . Hypertension Father   . Cancer Father   . Cancer Sister        Colon cancer  SOCIAL HISTORY:  reports that she quit smoking about 31 years ago. Her smoking use included cigarettes. She started smoking about 51 years ago. She has a 7.50 pack-year smoking history. She has never used smokeless tobacco. She reports that she does not drink alcohol and does not use drugs. The patient is retired from working in Orthoptist and has a 42 and 52 year old grandchildren that live with her.  She is married and lives in [place] and accompanied today by her husband.   ALLERGIES: Patient has no known allergies.   MEDICATIONS:  Current Outpatient Medications  Medication Sig Dispense Refill  . acetaminophen (TYLENOL) 650  MG CR tablet Take 1,300 mg by mouth 2 (two) times daily as needed for pain.    . Blood Glucose Monitoring Suppl (BLOOD GLUCOSE MONITOR SYSTEM) W/DEVICE KIT Please dispense based on patient and insurance preference. Use to monitor fasting blood sugar 1x daily. Dx: E11.9 1 each 0  . cloNIDine (CATAPRES) 0.2 MG tablet Take 1 tablet (0.2 mg total) by mouth 2 (two) times daily. 60 tablet 3  . Glucose Blood (BLOOD GLUCOSE TEST STRIPS) STRP Please dispense based on patient and insurance preference. Use to monitor fasting blood sugar 1x daily. Dx: E11.9 100 each 0  . hydrALAZINE (APRESOLINE) 50 MG tablet Take 1 tablet (50 mg total) by mouth 3 (three) times daily. 90 tablet 3  . Lancets 28G MISC Please dispense based on patient and insurance preference. Use to monitor fasting blood sugar 1x daily. Dx: E11.9 100 each 0  . levothyroxine (SYNTHROID) 200 MCG tablet TAKE 1 TABLET BY MOUTH EVERY DAY BEFORE BREAKFAST (Patient taking differently: Take 200 mcg by mouth daily before breakfast. ) 30 tablet 3  . losartan (COZAAR) 100 MG tablet Take 1 tablet (100 mg total) by mouth daily. 90 tablet 3  . metFORMIN (GLUCOPHAGE) 500 MG tablet TAKE 1 TABLET BY MOUTH TWICE DAILY WITH A MEAL (Patient taking differently: Take 500 mg by mouth 2 (two) times daily with a meal. ) 180 tablet 1  . Multiple Vitamins-Minerals (CENTRUM SILVER PO) Take 1 tablet by mouth daily.     . potassium chloride SA (K-DUR,KLOR-CON) 20 MEQ tablet Take 1 tablet (20 mEq total) by mouth daily. 90 tablet 3  . atorvastatin (LIPITOR) 40 MG tablet TAKE 1 TABLET(40 MG) BY MOUTH DAILY (Patient not taking: Reported on 03/19/2020) 90 tablet 1  . furosemide (LASIX) 20 MG tablet Take 20 mg by mouth daily with breakfast. (Patient not taking: Reported on 03/19/2020)    . spironolactone (ALDACTONE) 25 MG tablet Take 0.5 tablets (12.5 mg total) by mouth daily. (Patient not taking: Reported on 03/19/2020) 30 tablet 2   No current facility-administered medications for  this encounter.     REVIEW OF SYSTEMS: On review of systems, the patient reports that she is doing well overall. She reports she has been tired with all of the recent work up of her blood pressure. She denies any concerns with symptoms of headaches or chest pain today. She feels like her breast is healing well but there are some firm areas she's been concerned about.     PHYSICAL EXAM:  Wt Readings from Last 3 Encounters:  03/19/20 256 lb 2 oz (116.2 kg)  03/17/20 257 lb 3.2 oz (116.7 kg)  03/06/20 256 lb (116.1 kg)   Temp Readings from Last 3 Encounters:  03/19/20 98.5 F (36.9 C) (Oral)  03/06/20 97.8 F (36.6 C) (Temporal)  03/05/20 98.2 F (36.8 C) (Oral)  BP Readings from Last 3 Encounters:  03/19/20 (!) 164/98  03/17/20 (!) 144/88  03/06/20 (!) 164/92   Pulse Readings from Last 3 Encounters:  03/19/20 60  03/17/20 92  03/06/20 90    In general this is a well appearing African American in no acute distress. She's alert and oriented x4 and appropriate throughout the examination. Cardiopulmonary assessment is negative for acute distress and she exhibits normal effort. The left breast incision is healing well without cellulitic streaking bleeding or discharge.  There is palpable induration deep to the incision line without any fluctuance or concerns for fluid accumulation.   ECOG = 1  0 - Asymptomatic (Fully active, able to carry on all predisease activities without restriction)  1 - Symptomatic but completely ambulatory (Restricted in physically strenuous activity but ambulatory and able to carry out work of a light or sedentary nature. For example, light housework, office work)  2 - Symptomatic, <50% in bed during the day (Ambulatory and capable of all self care but unable to carry out any work activities. Up and about more than 50% of waking hours)  3 - Symptomatic, >50% in bed, but not bedbound (Capable of only limited self-care, confined to bed or chair 50% or more  of waking hours)  4 - Bedbound (Completely disabled. Cannot carry on any self-care. Totally confined to bed or chair)  5 - Death   Eustace Pen MM, Creech RH, Tormey DC, et al. [phone]). "Toxicity and response criteria of the [facility]". [place] Oncol. 5 (6): 649-55    LABORATORY DATA:  Lab Results  Component Value Date   WBC 8.0 03/05/2020   HGB 12.5 03/05/2020   HCT 40.2 03/05/2020   MCV 83.8 03/05/2020   PLT 239 03/05/2020   Lab Results  Component Value Date   NA 142 03/05/2020   K 3.1 (L) 03/05/2020   CL 104 03/05/2020   CO2 28 03/05/2020   Lab Results  Component Value Date   ALT 14 03/03/2020   AST 17 03/03/2020   ALKPHOS 70 03/03/2020   BILITOT 0.9 03/03/2020      RADIOGRAPHY: MR Abdomen W Wo Contrast  Result Date: 03/19/2020 CLINICAL DATA:  Hypertension.  Evaluate for adrenal mass. EXAM: MRI ABDOMEN WITHOUT AND WITH CONTRAST TECHNIQUE: Multiplanar multisequence MR imaging of the abdomen was performed both before and after the administration of intravenous contrast. CONTRAST:  71m GADAVIST GADOBUTROL 1 MMOL/ML IV SOLN COMPARISON:  CT abdomen 10/10/2014 FINDINGS: Lower chest: No acute findings. Hepatobiliary: Mild diffuse hepatic steatosis. No suspicious liver abnormality. Gallbladder is unremarkable. No gallstones or gallbladder sludge. No significant bile duct dilatation identified. Pancreas: No mass, inflammatory changes, or other parenchymal abnormality identified. Spleen:  Within normal limits in size and appearance. Adrenals/Urinary Tract: The left adrenal nodule measures 3.5 x 2.2 cm. Loss of signal on the out of phase sequence is noted compatible with benign adenoma. Previously this nodule measured 3.2 x 2.2 cm. Normal appearance of the right adrenal gland. No hydronephrosis identified bilaterally. Cyst within upper pole of right kidney measures 1.4 cm. Also in the upper pole of the right kidney is a 1.2 cm cyst. Several additional small cystic  lesions are noted bilaterally. These all measure less than 1 cm and are too small to reliably characterize. No hydronephrosis identified bilaterally. Stomach/Bowel: Visualized portions within the abdomen are unremarkable. Vascular/Lymphatic: No pathologically enlarged lymph nodes identified. No abdominal aortic aneurysm demonstrated. Other:  No free fluid or fluid collections Musculoskeletal: No suspicious bone  lesions identified. IMPRESSION: 1. Left adrenal nodule has signal characteristics compatible with benign adenoma. This is stable to mildly increased in size when compared with 10/10/2018. 2. Bilateral kidney cysts. 3. Hepatic steatosis. Electronically Signed   By: Kerby Moors M.D.   On: 03/19/2020 09:21   MM Breast Surgical Specimen  Addendum Date: 02/20/2020   ADDENDUM REPORT: 02/20/2020 10:34 ADDENDUM: In the Findings section, I inadvertently stated that the coil shaped tissue marker clip is in the specimen. Only the radioactive seed is in the specimen, and the seed is intact. Electronically Signed   By: Evangeline Dakin M.D.   On: 02/20/2020 10:34   Result Date: 02/20/2020 CLINICAL DATA:  Biopsy-proven DCIS involving the UPPER OUTER QUADRANT of the LEFT breast for which lumpectomy is performed. Radioactive seed localization was performed yesterday. EXAM: SPECIMEN RADIOGRAPH OF THE LEFT BREAST COMPARISON:  Previous exam(s). FINDINGS: Status post excision of the LEFT breast. The radioactive seed is present in a non-compressed specimen and is intact. The coil shaped tissue marker clip is in a second non-compressed specimen. This was discussed by telephone with the operating room nurse at the time of interpretation on 02/19/2020 at 2:07 p.m. IMPRESSION: Specimen radiograph of the LEFT breast. Electronically Signed: By: Evangeline Dakin M.D. On: 02/19/2020 14:12   ECHOCARDIOGRAM COMPLETE  Result Date: 03/04/2020    ECHOCARDIOGRAM REPORT   Patient Name:   Frances Hughes Date of Exam: 03/04/2020  Medical Rec #:  [id number]        Height:       63.0 in Accession #:    [id number]       Weight:       237.0 lb Date of Birth:  [birth year]        BSA:          2.078 m Patient Age:    24 years         BP:           168/84 mmHg Patient Gender: F                HR:           66 bpm. Exam Location:  Inpatient Procedure: 2D Echo, Color Doppler and Cardiac Doppler Indications:    I10 Hypertension  History:        Patient has prior history of Echocardiogram examinations, most                 recent 12/07/2017. Risk Factors:Hypertension, Diabetes,                 Dyslipidemia and Sleep Apnea.  Sonographer:    Raquel Sarna Senior RDCS Referring Phys: [place]  1. Left ventricular ejection fraction, by estimation, is 60 to 65%. The left ventricle has normal function. The left ventricle has no regional wall motion abnormalities. There is moderate concentric left ventricular hypertrophy. Left ventricular diastolic parameters are consistent with Grade I diastolic dysfunction (impaired relaxation). LV filling pressures are elevated with E/E' 20.  2. Right ventricular systolic function is normal. The right ventricular size is normal. There is normal pulmonary artery systolic pressure.  3. Left atrial size was moderately dilated.  4. The mitral valve is normal in structure. Trivial mitral valve regurgitation. Moderate mitral annular calcification.  5. The aortic valve is tricuspid. There is mild calcification of the aortic valve. There is mild thickening of the aortic valve. Aortic valve regurgitation is not visualized. Mild aortic valve sclerosis is present, with no evidence  of aortic valve stenosis.  6. Aortic dilatation noted. There is mild dilatation of the ascending aorta, measuring 38 mm.  7. The inferior vena cava is normal in size with greater than 50% respiratory variability, suggesting right atrial pressure of 3 mmHg. Comparison(s): No significant change from prior study. FINDINGS  Left Ventricle: Left  ventricular ejection fraction, by estimation, is 60 to 65%. The left ventricle has normal function. The left ventricle has no regional wall motion abnormalities. The left ventricular internal cavity size was normal in size. There is  moderate concentric left ventricular hypertrophy. Left ventricular diastolic parameters are consistent with Grade I diastolic dysfunction (impaired relaxation). LV filling pressures are elevated with E/E' 20.V filling pressures are elevated with E/E' 20. V filling pressures are elevated with E/E' 20. Right Ventricle: The right ventricular size is normal. Right vetricular wall thickness was not assessed. Right ventricular systolic function is normal. There is normal pulmonary artery systolic pressure. The tricuspid regurgitant velocity is 2.14 m/s, and with an assumed right atrial pressure of 3 mmHg, the estimated right ventricular systolic pressure is 48.8 mmHg. Left Atrium: Left atrial size was moderately dilated. Right Atrium: Right atrial size was normal in size. Pericardium: There is no evidence of pericardial effusion. Mitral Valve: The mitral valve is normal in structure. There is mild thickening of the mitral valve leaflet(s). There is mild calcification of the mitral valve leaflet(s). Moderate mitral annular calcification. Trivial mitral valve regurgitation. Tricuspid Valve: The tricuspid valve is grossly normal. Tricuspid valve regurgitation is trivial. Aortic Valve: The aortic valve is tricuspid. There is mild calcification of the aortic valve. There is mild thickening of the aortic valve. Aortic valve regurgitation is not visualized. Mild aortic valve sclerosis is present, with no evidence of aortic valve stenosis. Pulmonic Valve: The pulmonic valve was normal in structure. Pulmonic valve regurgitation is not visualized. Aorta: Aortic dilatation noted. There is mild dilatation of the ascending aorta, measuring 38 mm. Venous: The inferior vena cava is normal in size with  greater than 50% respiratory variability, suggesting right atrial pressure of 3 mmHg. IAS/Shunts: No atrial level shunt detected by color flow Doppler.  LEFT VENTRICLE PLAX 2D LVIDd:         4.80 cm  Diastology LVIDs:         3.00 cm  LV e' medial:    4.46 cm/s LV PW:         1.50 cm  LV E/e' medial:  20.8 LV IVS:        1.50 cm  LV e' lateral:   4.79 cm/s LVOT diam:     2.00 cm  LV E/e' lateral: 19.4 LV SV:         64 LV SV Index:   31 LVOT Area:     3.14 cm  RIGHT VENTRICLE RV S prime:     12.20 cm/s TAPSE (M-mode): 1.9 cm LEFT ATRIUM             Index       RIGHT ATRIUM           Index LA diam:        3.40 cm 1.64 cm/m  RA Area:     16.60 cm LA Vol (A2C):   73.7 ml 35.46 ml/m RA Volume:   42.90 ml  20.64 ml/m LA Vol (A4C):   86.8 ml 41.77 ml/m LA Biplane Vol: 83.5 ml 40.18 ml/m  AORTIC VALVE LVOT Vmax:   93.25 cm/s LVOT Vmean:  68.550 cm/s LVOT  VTI:    0.204 m  AORTA Ao Root diam: 3.20 cm Ao Asc diam:  3.80 cm MITRAL VALVE                TRICUSPID VALVE MV Area (PHT): 2.66 cm     TR Peak grad:   18.3 mmHg MV Decel Time: 285 msec     TR Vmax:        214.00 cm/s MV E velocity: 92.70 cm/s MV A velocity: 127.00 cm/s  SHUNTS MV E/A ratio:  0.73         Systemic VTI:  0.20 m                             Systemic Diam: 2.00 cm Gwyndolyn Kaufman MD Electronically signed by Gwyndolyn Kaufman MD Signature Date/Time: 03/04/2020/4:26:09 PM    Final        IMPRESSION/PLAN: 1. ER/PR positive, low to intermediate grade DCIS with calcifications of the left breast. Dr. Lisbeth Renshaw discusses the final pathology findings and reviews the nature of noninvasive left breast disease.  She is doing well surgically and is ready to proceed with adjuvant therapies. Dr. Lisbeth Renshaw reviews the rationale for external radiotherapy to the breast to reduce the risks of local recurrence, as well as  antiestrogen therapy. We discussed the risks, benefits, short, and long term effects of radiotherapy, and the patient is interested in proceeding.  Dr. Lisbeth Renshaw discusses the delivery and logistics of radiotherapy and recommends 4 weeks of radiotherapy with deep inspiration breath hold technique. Written consent is obtained and placed in the chart, a copy was provided to the patient. She will simulate today following our discussion . 2. Hypertension. The patient continues to be worked up by endocrinology and with medication management also with Dr. Jana Hakim. We will follow this expectantly.  In a visit lasting 45 minutes, greater than 50% of the time was spent face to face discussing the patient's condition, in preparation for the discussion, and coordinating the patient's care.   The above documentation reflects my direct findings during this shared patient visit. Please see the separate note by Dr. Lisbeth Renshaw on this date for the remainder of the patient's plan of care.    Carola Rhine, PAC

## 2020-03-25 ENCOUNTER — Encounter: Payer: Self-pay | Admitting: *Deleted

## 2020-03-26 ENCOUNTER — Ambulatory Visit: Payer: Medicare Other | Admitting: Radiation Oncology

## 2020-03-27 ENCOUNTER — Ambulatory Visit: Payer: Medicare Other

## 2020-03-27 DIAGNOSIS — D0512 Intraductal carcinoma in situ of left breast: Secondary | ICD-10-CM | POA: Diagnosis not present

## 2020-03-27 DIAGNOSIS — Z170 Estrogen receptor positive status [ER+]: Secondary | ICD-10-CM | POA: Diagnosis not present

## 2020-03-30 ENCOUNTER — Ambulatory Visit
Admission: RE | Admit: 2020-03-30 | Discharge: 2020-03-30 | Disposition: A | Discharge status: Home / Self Care | Payer: Medicare Other | Source: Ambulatory Visit | Attending: Radiation Oncology | Admitting: Radiation Oncology

## 2020-03-30 ENCOUNTER — Other Ambulatory Visit: Payer: Self-pay

## 2020-03-30 DIAGNOSIS — Z170 Estrogen receptor positive status [ER+]: Secondary | ICD-10-CM | POA: Diagnosis not present

## 2020-03-30 DIAGNOSIS — D0512 Intraductal carcinoma in situ of left breast: Secondary | ICD-10-CM | POA: Diagnosis not present

## 2020-03-31 ENCOUNTER — Ambulatory Visit
Admission: RE | Admit: 2020-03-31 | Discharge: 2020-03-31 | Disposition: A | Discharge status: Home / Self Care | Payer: Medicare Other | Source: Ambulatory Visit | Attending: Radiation Oncology | Admitting: Radiation Oncology

## 2020-03-31 DIAGNOSIS — D0512 Intraductal carcinoma in situ of left breast: Secondary | ICD-10-CM | POA: Diagnosis not present

## 2020-03-31 DIAGNOSIS — Z170 Estrogen receptor positive status [ER+]: Secondary | ICD-10-CM | POA: Diagnosis not present

## 2020-04-01 ENCOUNTER — Other Ambulatory Visit: Payer: Self-pay

## 2020-04-01 ENCOUNTER — Ambulatory Visit
Admission: RE | Admit: 2020-04-01 | Discharge: 2020-04-01 | Disposition: A | Discharge status: Home / Self Care | Payer: Medicare Other | Source: Ambulatory Visit | Attending: Radiation Oncology | Admitting: Radiation Oncology

## 2020-04-01 DIAGNOSIS — D0512 Intraductal carcinoma in situ of left breast: Secondary | ICD-10-CM | POA: Diagnosis not present

## 2020-04-01 DIAGNOSIS — Z170 Estrogen receptor positive status [ER+]: Secondary | ICD-10-CM | POA: Diagnosis not present

## 2020-04-02 ENCOUNTER — Ambulatory Visit
Admission: RE | Admit: 2020-04-02 | Discharge: 2020-04-02 | Disposition: A | Discharge status: Home / Self Care | Payer: Medicare Other | Source: Ambulatory Visit | Attending: Radiation Oncology | Admitting: Radiation Oncology

## 2020-04-02 DIAGNOSIS — D0512 Intraductal carcinoma in situ of left breast: Secondary | ICD-10-CM | POA: Diagnosis not present

## 2020-04-02 DIAGNOSIS — Z170 Estrogen receptor positive status [ER+]: Secondary | ICD-10-CM | POA: Diagnosis not present

## 2020-04-02 NOTE — Progress Notes (Signed)
Pt here for patient teaching.  Pt given Radiation and You booklet, skin care instructions, Alra deodorant and Sonafine.  Reviewed areas of pertinence such as fatigue, hair loss, skin changes, breast tenderness and breast swelling . Pt able to give teach back of to pat skin and use unscented/gentle soap,apply Sonafine bid, avoid applying anything to skin within 4 hours of treatment, avoid wearing an under wire bra and to use an electric razor if they must shave. Pt verbalizes understanding of information given and will contact nursing with any questions or concerns.     Aum Caggiano M. Monai Hindes RN, BSN             

## 2020-04-03 ENCOUNTER — Ambulatory Visit
Admission: RE | Admit: 2020-04-03 | Discharge: 2020-04-03 | Disposition: A | Discharge status: Home / Self Care | Payer: Medicare Other | Source: Ambulatory Visit | Attending: Radiation Oncology | Admitting: Radiation Oncology

## 2020-04-03 DIAGNOSIS — D0512 Intraductal carcinoma in situ of left breast: Secondary | ICD-10-CM | POA: Diagnosis not present

## 2020-04-03 DIAGNOSIS — Z170 Estrogen receptor positive status [ER+]: Secondary | ICD-10-CM | POA: Diagnosis not present

## 2020-04-03 MED ORDER — ALRA NON-METALLIC DEODORANT (RAD-ONC)
1.0000 "application " | Freq: Once | TOPICAL | Status: AC
  Administered 2020-04-03: 1 via TOPICAL

## 2020-04-03 MED ORDER — SONAFINE EX EMUL
1.0000 "application " | Freq: Once | CUTANEOUS | Status: AC
  Administered 2020-04-03: 1 via TOPICAL

## 2020-04-06 ENCOUNTER — Ambulatory Visit
Admission: RE | Admit: 2020-04-06 | Discharge: 2020-04-06 | Disposition: A | Discharge status: Home / Self Care | Payer: Medicare Other | Source: Ambulatory Visit | Attending: Radiation Oncology | Admitting: Radiation Oncology

## 2020-04-06 DIAGNOSIS — Z170 Estrogen receptor positive status [ER+]: Secondary | ICD-10-CM | POA: Diagnosis not present

## 2020-04-06 DIAGNOSIS — D0512 Intraductal carcinoma in situ of left breast: Secondary | ICD-10-CM | POA: Diagnosis not present

## 2020-04-07 ENCOUNTER — Other Ambulatory Visit: Payer: Self-pay

## 2020-04-07 ENCOUNTER — Ambulatory Visit
Admission: RE | Admit: 2020-04-07 | Discharge: 2020-04-07 | Disposition: A | Discharge status: Home / Self Care | Payer: Medicare Other | Source: Ambulatory Visit | Attending: Radiation Oncology | Admitting: Radiation Oncology

## 2020-04-07 DIAGNOSIS — D0512 Intraductal carcinoma in situ of left breast: Secondary | ICD-10-CM | POA: Diagnosis not present

## 2020-04-07 DIAGNOSIS — Z170 Estrogen receptor positive status [ER+]: Secondary | ICD-10-CM | POA: Diagnosis not present

## 2020-04-08 ENCOUNTER — Ambulatory Visit
Admission: RE | Admit: 2020-04-08 | Discharge: 2020-04-08 | Disposition: A | Discharge status: Home / Self Care | Payer: Medicare Other | Source: Ambulatory Visit | Attending: Radiation Oncology | Admitting: Radiation Oncology

## 2020-04-08 DIAGNOSIS — Z170 Estrogen receptor positive status [ER+]: Secondary | ICD-10-CM | POA: Diagnosis not present

## 2020-04-08 DIAGNOSIS — D0512 Intraductal carcinoma in situ of left breast: Secondary | ICD-10-CM | POA: Diagnosis not present

## 2020-04-09 ENCOUNTER — Ambulatory Visit
Admission: RE | Admit: 2020-04-09 | Discharge: 2020-04-09 | Disposition: A | Discharge status: Home / Self Care | Payer: Medicare Other | Source: Ambulatory Visit | Attending: Radiation Oncology | Admitting: Radiation Oncology

## 2020-04-09 DIAGNOSIS — D0512 Intraductal carcinoma in situ of left breast: Secondary | ICD-10-CM | POA: Diagnosis not present

## 2020-04-09 DIAGNOSIS — Z170 Estrogen receptor positive status [ER+]: Secondary | ICD-10-CM | POA: Diagnosis not present

## 2020-04-10 ENCOUNTER — Ambulatory Visit: Payer: Medicare Other | Admitting: Radiation Oncology

## 2020-04-10 ENCOUNTER — Ambulatory Visit
Admission: RE | Admit: 2020-04-10 | Discharge: 2020-04-10 | Disposition: A | Discharge status: Home / Self Care | Payer: Medicare Other | Source: Ambulatory Visit | Attending: Radiation Oncology | Admitting: Radiation Oncology

## 2020-04-10 DIAGNOSIS — D0512 Intraductal carcinoma in situ of left breast: Secondary | ICD-10-CM | POA: Diagnosis not present

## 2020-04-10 DIAGNOSIS — Z170 Estrogen receptor positive status [ER+]: Secondary | ICD-10-CM | POA: Diagnosis not present

## 2020-04-12 ENCOUNTER — Other Ambulatory Visit: Payer: Self-pay

## 2020-04-12 ENCOUNTER — Ambulatory Visit
Admission: RE | Admit: 2020-04-12 | Discharge: 2020-04-12 | Disposition: A | Discharge status: Home / Self Care | Payer: Medicare Other | Source: Ambulatory Visit | Attending: Radiation Oncology | Admitting: Radiation Oncology

## 2020-04-12 DIAGNOSIS — Z170 Estrogen receptor positive status [ER+]: Secondary | ICD-10-CM | POA: Diagnosis not present

## 2020-04-12 DIAGNOSIS — D0512 Intraductal carcinoma in situ of left breast: Secondary | ICD-10-CM | POA: Diagnosis not present

## 2020-04-13 ENCOUNTER — Ambulatory Visit
Admission: RE | Admit: 2020-04-13 | Discharge: 2020-04-13 | Disposition: A | Discharge status: Home / Self Care | Payer: Medicare Other | Source: Ambulatory Visit | Attending: Radiation Oncology | Admitting: Radiation Oncology

## 2020-04-13 ENCOUNTER — Other Ambulatory Visit (HOSPITAL_COMMUNITY)
Admission: RE | Admit: 2020-04-13 | Discharge: 2020-04-13 | Disposition: A | Discharge status: Home / Self Care | Payer: Medicare Other | Source: Ambulatory Visit | Attending: "Endocrinology | Admitting: "Endocrinology

## 2020-04-13 DIAGNOSIS — D3502 Benign neoplasm of left adrenal gland: Secondary | ICD-10-CM | POA: Diagnosis not present

## 2020-04-13 DIAGNOSIS — E890 Postprocedural hypothyroidism: Secondary | ICD-10-CM | POA: Insufficient documentation

## 2020-04-13 DIAGNOSIS — D0512 Intraductal carcinoma in situ of left breast: Secondary | ICD-10-CM | POA: Diagnosis not present

## 2020-04-13 DIAGNOSIS — Z170 Estrogen receptor positive status [ER+]: Secondary | ICD-10-CM | POA: Diagnosis not present

## 2020-04-13 LAB — TSH: TSH: 3.724 u[IU]/mL (ref 0.350–4.500)

## 2020-04-13 LAB — T4, FREE: Free T4: 1.05 ng/dL (ref 0.61–1.12)

## 2020-04-13 LAB — COMPREHENSIVE METABOLIC PANEL
ALT: 13 U/L (ref 0–44)
AST: 14 U/L — ABNORMAL LOW (ref 15–41)
Albumin: 3.5 g/dL (ref 3.5–5.0)
Alkaline Phosphatase: 66 U/L (ref 38–126)
Anion gap: 8 (ref 5–15)
BUN: 16 mg/dL (ref 8–23)
CO2: 32 mmol/L (ref 22–32)
Calcium: 8.9 mg/dL (ref 8.9–10.3)
Chloride: 102 mmol/L (ref 98–111)
Creatinine, Ser: 0.68 mg/dL (ref 0.44–1.00)
GFR, Estimated: >60 mL/min (ref >60)
Glucose, Bld: 88 mg/dL (ref 70–99)
Potassium: 3.5 mmol/L (ref 3.5–5.1)
Sodium: 142 mmol/L (ref 135–145)
Total Bilirubin: 0.4 mg/dL (ref 0.3–1.2)
Total Protein: 7.5 g/dL (ref 6.5–8.1)

## 2020-04-14 ENCOUNTER — Ambulatory Visit
Admission: RE | Admit: 2020-04-14 | Discharge: 2020-04-14 | Disposition: A | Discharge status: Home / Self Care | Payer: Medicare Other | Source: Ambulatory Visit | Attending: Radiation Oncology | Admitting: Radiation Oncology

## 2020-04-14 ENCOUNTER — Other Ambulatory Visit: Payer: Self-pay

## 2020-04-14 ENCOUNTER — Other Ambulatory Visit (HOSPITAL_COMMUNITY)
Admission: RE | Admit: 2020-04-14 | Discharge: 2020-04-14 | Disposition: A | Discharge status: Home / Self Care | Payer: Medicare Other | Source: Ambulatory Visit | Attending: "Endocrinology | Admitting: "Endocrinology

## 2020-04-14 DIAGNOSIS — D0512 Intraductal carcinoma in situ of left breast: Secondary | ICD-10-CM | POA: Diagnosis not present

## 2020-04-14 DIAGNOSIS — Z170 Estrogen receptor positive status [ER+]: Secondary | ICD-10-CM | POA: Diagnosis not present

## 2020-04-14 DIAGNOSIS — D3502 Benign neoplasm of left adrenal gland: Secondary | ICD-10-CM | POA: Diagnosis not present

## 2020-04-15 ENCOUNTER — Ambulatory Visit
Admission: RE | Admit: 2020-04-15 | Discharge: 2020-04-15 | Disposition: A | Discharge status: Home / Self Care | Payer: Medicare Other | Source: Ambulatory Visit | Attending: Radiation Oncology | Admitting: Radiation Oncology

## 2020-04-15 ENCOUNTER — Ambulatory Visit: Payer: Medicare Other | Admitting: "Endocrinology

## 2020-04-15 ENCOUNTER — Encounter: Payer: Self-pay | Admitting: "Endocrinology

## 2020-04-15 VITALS — BP 144/72 | HR 96 | Ht 63.0 in | Wt 259.2 lb

## 2020-04-15 DIAGNOSIS — I10 Essential (primary) hypertension: Secondary | ICD-10-CM | POA: Diagnosis not present

## 2020-04-15 DIAGNOSIS — E890 Postprocedural hypothyroidism: Secondary | ICD-10-CM | POA: Diagnosis not present

## 2020-04-15 DIAGNOSIS — D3502 Benign neoplasm of left adrenal gland: Secondary | ICD-10-CM | POA: Diagnosis not present

## 2020-04-15 DIAGNOSIS — Z170 Estrogen receptor positive status [ER+]: Secondary | ICD-10-CM | POA: Diagnosis not present

## 2020-04-15 DIAGNOSIS — D0512 Intraductal carcinoma in situ of left breast: Secondary | ICD-10-CM | POA: Diagnosis not present

## 2020-04-15 MED ORDER — POTASSIUM CHLORIDE CRYS ER 20 MEQ PO TBCR
20.0000 meq | EXTENDED_RELEASE_TABLET | Freq: Every day | ORAL | 1 refills | Status: DC
Start: 2020-04-15 — End: 2020-10-29

## 2020-04-15 NOTE — Progress Notes (Signed)
04/15/2020, 3:05 PM  Endocrinology follow-up note   Subjective:    [name], female    DOB: 12-Dec-1955, PCP Alycia Rossetti, MD   Past Medical History:  Diagnosis Date  . Adrenal mass, left ([place])   . Degenerative disc disease at L5-S1 level   . DM (diabetes mellitus) ([place])    Type II controlled  . Enlarged heart   . Grave's disease   . HTN (hypertension)   . Hyperlipidemia   . Lymphocytosis   . Nephrolithiasis   . OSA on CPAP    Past Surgical History:  Procedure Laterality Date  . ABDOMINAL HYSTERECTOMY    . BREAST BIOPSY Left 01/06/2020  . BREAST LUMPECTOMY WITH RADIOACTIVE SEED LOCALIZATION Left 02/19/2020   Procedure: LEFT BREAST LUMPECTOMY WITH RADIOACTIVE SEED LOCALIZATION;  Surgeon: [name], MD;  Location: [facility];  Service: General;  Laterality: Left;  . COLONOSCOPY  2011   normal colonoscopy  . COLONOSCOPY WITH PROPOFOL N/A 04/16/2018   Procedure: COLONOSCOPY WITH PROPOFOL;  Surgeon: Daneil Dolin, MD;  Location[facility];  Service: Endoscopy;  Laterality: N/A;  9:45am  . HERNIA REPAIR  2011  . LITHOTRIPSY    . POLYPECTOMY  04/16/2018   Procedure: POLYPECTOMY;  Surgeon: Daneil Dolin, MD;  Location: [facility];  Service: Endoscopy;;  (colon)  . S/P Hysterectomy  10/1996   with bilat SOO seconardt to fibroids  . thyroid ablation  1986   Social History   Socioeconomic History  . Marital status: Married    Spouse name: Not on file  . Number of children: Not on file  . Years of education: Not on file  . Highest education level: Not on file  Occupational History  . Not on file  Tobacco Use  . Smoking status: Former Smoker    Packs/day: 0.75    Years: 10.00    Pack years: 7.50    Types: Cigarettes    Start date: 01/30/1969    Quit date: 05/23/1988    Years since quitting: 31.9  . Smokeless tobacco: Never Used  Substance and Sexual Activity  .  Alcohol use: No    Alcohol/week: 0.0 standard drinks  . Drug use: No  . Sexual activity: Yes  Other Topics Concern  . Not on file  Social History Narrative  . Not on file   Social Determinants of Health   Financial Resource Strain:   . Difficulty of Paying Living Expenses: Not on file  Food Insecurity:   . Worried About Charity fundraiser in the Last Year: Not on file  . Ran Out of Food in the Last Year: Not on file  Transportation Needs:   . Lack of Transportation (Medical): Not on file  . Lack of Transportation (Non-Medical): Not on file  Physical Activity:   . Days of Exercise per Week: Not on file  . Minutes of Exercise per Session: Not on file  Stress:   . Feeling of Stress : Not on file  Social Connections:   . Frequency of Communication with Friends and Family: Not on file  . Frequency of Social Gatherings with Friends and Family: Not on file  . Attends Religious  Services: Not on file  . Active Member of Clubs or Organizations: Not on file  . Attends Archivist Meetings: Not on file  . Marital Status: Not on file   Family History  Problem Relation Age of Onset  . Colon cancer Mother   . Colon cancer Sister   . Liver cancer Sister   . Diabetes Father   . Hypertension Father   . Cancer Father   . Cancer Sister        Colon cancer   Outpatient Encounter Medications as of 04/15/2020  Medication Sig  . acetaminophen (TYLENOL) 650 MG CR tablet Take 1,300 mg by mouth 2 (two) times daily as needed for pain.  . Blood Glucose Monitoring Suppl (BLOOD GLUCOSE MONITOR SYSTEM) W/DEVICE KIT Please dispense based on patient and insurance preference. Use to monitor fasting blood sugar 1x daily. Dx: E11.9  . cloNIDine (CATAPRES) 0.2 MG tablet Take 1 tablet (0.2 mg total) by mouth 2 (two) times daily.  . furosemide (LASIX) 20 MG tablet Take 20 mg by mouth daily with breakfast. (Patient not taking: Reported on 03/19/2020)  . Glucose Blood (BLOOD GLUCOSE TEST STRIPS)  STRP Please dispense based on patient and insurance preference. Use to monitor fasting blood sugar 1x daily. Dx: E11.9  . hydrALAZINE (APRESOLINE) 50 MG tablet Take 1 tablet (50 mg total) by mouth 3 (three) times daily.  . Lancets 28G MISC Please dispense based on patient and insurance preference. Use to monitor fasting blood sugar 1x daily. Dx: E11.9  . levothyroxine (SYNTHROID) 200 MCG tablet TAKE 1 TABLET BY MOUTH EVERY DAY BEFORE BREAKFAST (Patient taking differently: Take 200 mcg by mouth daily before breakfast. )  . losartan (COZAAR) 100 MG tablet Take 1 tablet (100 mg total) by mouth daily.  . metFORMIN (GLUCOPHAGE) 500 MG tablet TAKE 1 TABLET BY MOUTH TWICE DAILY WITH A MEAL (Patient taking differently: Take 500 mg by mouth 2 (two) times daily with a meal. )  . potassium chloride SA (KLOR-CON) 20 MEQ tablet Take 1 tablet (20 mEq total) by mouth daily.  Marland Kitchen spironolactone (ALDACTONE) 25 MG tablet Take 0.5 tablets (12.5 mg total) by mouth daily. (Patient not taking: Reported on 03/19/2020)  . [DISCONTINUED] atorvastatin (LIPITOR) 40 MG tablet TAKE 1 TABLET(40 MG) BY MOUTH DAILY (Patient not taking: Reported on 03/19/2020)  . [DISCONTINUED] Multiple Vitamins-Minerals (CENTRUM SILVER PO) Take 1 tablet by mouth daily.   . [DISCONTINUED] potassium chloride SA (K-DUR,KLOR-CON) 20 MEQ tablet Take 1 tablet (20 mEq total) by mouth daily.   No facility-administered encounter medications on file as of 04/15/2020.   ALLERGIES: No Known Allergies  VACCINATION STATUS: Immunization History  Administered Date(s) Administered  . Fluad Quad(high Dose 65+) 02/08/2019, 03/05/2020  . Influenza-Unspecified 03/21/2011, 03/20/2015  . Moderna SARS-COVID-2 Vaccination 07/08/2019, 07/31/2019  . Pneumococcal Polysaccharide-23 04/13/2007, 03/05/2020  . Tdap 09/14/2012    HPI Frances Hughes is 64 y.o. female who was recently seen in consultation for left adrenal adenoma requested by Alycia Rossetti, MD. She  has a long and complicated medical history.  History is obtained directly from the patient and review of her medical records.  she has history of hypertension on multiple medications.  She also has history of Graves' disease which required radioactive iodine thyroid ablation in 2006.  She is currently on levothyroxine 200 mcg p.o. daily.  She reports compliance to this medication.   She is known to have left adrenal mass at least since September 2005.  She has a series  of abdominal and thoracic imaging studies since then and it was documented that this lesion was stable and displays features consistent with benign adenoma.  During her last abdominal CT in 2016 it measured 3.5 cm (3.2 cm in 2007) and the lesion demonstrated washout characteristics consistent with benign adrenal adenoma with absolute washout of 93%.  The right adrenal glands were normal. Over the years, she underwent various endocrine work-up for this adenoma and none of them were significant for functioning lesion. Most recently in October 2021 she underwent 24-hour urine collection for metanephrines which were normal.  Before this visit, she gave a sample for plasma metanephrines, still in process. She has better control of blood pressure with losartan, clonidine, and hydralazine. Along with her hypertension, patient has chronic hypokalemia which required intermittent supplement with potassium.  She is currently on potassium 20 mEq daily. She is not on regular diuresis with Lasix anymore.    She has well-controlled type 2 diabetes with recent A1c of 5.9%, currently on Metformin 500 mg p.o. twice daily. She is recently diagnosed with breast cancer being prepared for chemo, surgery.  She was in the hospital in March 03, 2020, while on diuretics, spironolactone she underwent aldosterone, plasma renin activity measurements.  Aldosterone was 8.2, plasma renin activity was suppressed at 0.167 which led to a low/PRA ratio  > 49.1.  A.m.  cortisol on March 04, 2020 was 12.7-normal.  Patient describes chronic fatigue, exertional dyspnea, inability to lose weight/progressive weight gain. For RAI induced hypothyroidism, she is on levothyroxine 200 mcg p.o. daily.  Her previsit thyroid function tests are consistent with appropriate replacement. She does not consume licorice, no family history of premature coronary artery disease, CVA, adrenal, thyroid, parathyroid, pituitary dysfunctions.  Review of Systems  Constitutional: + Slightly fluctuating body weight, + fatigue, no subjective hyperthermia, no subjective hypothermia Eyes: no blurry vision, no xerophthalmia ENT: no sore throat, no nodules palpated in throat, no dysphagia/odynophagia, no hoarseness Cardiovascular: no Chest Pain, no Shortness of Breath, no palpitations, no leg swelling Respiratory: no cough, no shortness of breath Gastrointestinal: no Nausea/Vomiting/Diarhhea Musculoskeletal: no muscle/joint aches Skin: no rashes Neurological: no tremors, no numbness, no tingling, no dizziness Psychiatric: no depression, no anxiety  Objective:    Vitals with BMI 04/15/2020 03/19/2020 03/17/2020  Height '5\' 3"'  - '5\' 3"'   Weight 259 lbs 3 oz 256 lbs 2 oz 257 lbs 3 oz  BMI 45.93 80.16 55.37  Systolic 482 707 867  Diastolic 72 98 88  Pulse 96 60 92    BP (!) 144/72   Pulse 96   Ht '5\' 3"'  (1.6 m)   Wt 259 lb 3.2 oz (117.6 kg)   BMI 45.92 kg/m   Wt Readings from Last 3 Encounters:  04/15/20 259 lb 3.2 oz (117.6 kg)  03/19/20 256 lb 2 oz (116.2 kg)  03/17/20 257 lb 3.2 oz (116.7 kg)    Physical Exam   CMP ( most recent) CMP     Component Value Date/Time   NA 142 04/13/2020 1216   K 3.5 04/13/2020 1216   CL 102 04/13/2020 1216   CO2 32 04/13/2020 1216   GLUCOSE 88 04/13/2020 1216   BUN 16 04/13/2020 1216   CREATININE 0.68 04/13/2020 1216   CREATININE 0.74 03/02/2020 1526   CREATININE 0.88 12/18/2019 0847   CALCIUM 8.9 04/13/2020 1216   PROT 7.5  04/13/2020 1216   ALBUMIN 3.5 04/13/2020 1216   AST 14 (L) 04/13/2020 1216   AST 17 03/02/2020 1526  ALT 13 04/13/2020 1216   ALT 13 03/02/2020 1526   ALKPHOS 66 04/13/2020 1216   BILITOT 0.4 04/13/2020 1216   BILITOT 0.3 03/02/2020 1526   GFRNONAA >60 04/13/2020 1216   GFRNONAA >60 03/02/2020 1526   GFRNONAA 94 11/07/2018 0927   GFRAA >60 02/17/2020 0930   GFRAA 109 11/07/2018 0927     Diabetic Labs (most recent): Lab Results  Component Value Date   HGBA1C 5.9 (H) 03/03/2020   HGBA1C 6.0 (H) 12/18/2019   HGBA1C 6.2 (H) 09/09/2019     Lipid Panel ( most recent) Lipid Panel     Component Value Date/Time   CHOL 175 12/18/2019 0847   TRIG 69 12/18/2019 0847   HDL 57 12/18/2019 0847   CHOLHDL 3.1 12/18/2019 0847   VLDL 21 09/09/2016 1311   LDLCALC 102 (H) 12/18/2019 0847   LDLDIRECT 110 (H) 01/21/2012 0950      Results for Frances Hughes, Frances Hughes (MRN [id number]) as of 04/15/2020 14:44  Ref. Range 03/04/2020 08:27 04/13/2020 12:16  TSH Latest Ref Range: 0.350 - 4.500 uIU/mL  3.724  Triiodothyronine (T3) Latest Ref Range: 71 - 180 ng/dL 60 (L)   T4,Free(Direct) Latest Ref Range: 0.61 - 1.12 ng/dL 1.13 (H) 1.05      Results for Frances Hughes, Frances Hughes (MRN [id number]) as of 03/18/2020 21:02  Ref. Range 03/03/2020 16:36 03/04/2020 02:49  ALDOSTERONE Latest Ref Range: 0.0 - 30.0 ng/dL 8.2   PRA LC/MS/MS Latest Ref Range: 0.167 - 5.380 ng/mL/hr <0.167 (L)   ALDO / PRA Ratio Latest Ref Range: 0.0 - 30.0  >49.1 (H)   Cortisol - AM Latest Ref Range: 6.7 - 22.6 ug/dL  12.7     Assessment & Plan:   1. Adrenal adenoma- left -Her plasma free metanephrines measurements are still in progress.    - I have reviewed her available endocrine records and clinically evaluated the patient. - Based on these reviews, she has nonfunctioning, stable left adrenal adenoma which did not contribute for her history of hypertension.  This lesion was documented to be stable since 2005, will not need  surgical intervention at this time.  Her hypokalemia seems to be diuretic related, and responding to low-dose potassium supplement. She does not have paroxysmal symptoms to suggest pheochromocytoma.  Her recent work-up rules out  Cushing syndrome. Her recent work-up with aldosterone, plasma renin activity are not conclusive for primary hyperaldosteronism. -She is getting a better control on her blood pressure using losartan 100 mg p.o. daily, hydralazine 50 mg p.o. 3 times daily, clonidine 0.2 mg p.o. twice daily.  -She will be contacted if her plasma metanephrines are abnormal.  She will be assessed for any need for further imaging of left adrenal adenoma.   Regarding her RAI induced hypothyroidism: Her previsit thyroid function tests are consistent with appropriate replacement.  She is advised to continue levothyroxine 200 picograms p.o. daily before breakfast.   - We discussed about the correct intake of her thyroid hormone, on empty stomach at fasting, with water, separated by at least 30 minutes from breakfast and other medications,  and separated by more than 4 hours from calcium, iron, multivitamins, acid reflux medications (PPIs). -Patient is made aware of the fact that thyroid hormone replacement is needed for life, dose to be adjusted by periodic monitoring of thyroid function tests.   - she is advised to maintain close follow up with Alycia Rossetti, MD for primary care needs.      - Time spent on this patient care  encounter:  20 minutes of which 50% was spent in  counseling and the rest reviewing  her current and  previous labs / studies and medications  doses and developing a plan for long term care. Frances Hughes  participated in the discussions, expressed understanding, and voiced agreement with the above plans.  All questions were answered to her satisfaction. she is encouraged to contact clinic should she have any questions or concerns prior to her return visit.   Follow up  plan: Return in about 4 months (around 08/13/2020), or will call her next week for more lab results, for F/U with Pre-visit Labs.   Glade Lloyd, MD [facility] [address] [place], [place] [address] Phone: [phone]  Fax: [phone]     04/15/2020, 3:05 PM  This note was partially dictated with voice recognition software. Similar sounding words can be transcribed inadequately or may not  be corrected upon review.

## 2020-04-20 ENCOUNTER — Other Ambulatory Visit: Payer: Self-pay

## 2020-04-20 ENCOUNTER — Encounter: Payer: Self-pay | Admitting: Family Medicine

## 2020-04-20 ENCOUNTER — Ambulatory Visit
Admission: RE | Admit: 2020-04-20 | Discharge: 2020-04-20 | Disposition: A | Discharge status: Home / Self Care | Payer: Medicare Other | Source: Ambulatory Visit | Attending: Radiation Oncology | Admitting: Radiation Oncology

## 2020-04-20 ENCOUNTER — Ambulatory Visit (INDEPENDENT_AMBULATORY_CARE_PROVIDER_SITE_OTHER): Payer: Medicare Other | Admitting: Family Medicine

## 2020-04-20 VITALS — BP 146/78 | HR 88 | Temp 98.7°F | Resp 16 | Ht 63.0 in | Wt 261.0 lb

## 2020-04-20 DIAGNOSIS — E1149 Type 2 diabetes mellitus with other diabetic neurological complication: Secondary | ICD-10-CM

## 2020-04-20 DIAGNOSIS — I5189 Other ill-defined heart diseases: Secondary | ICD-10-CM

## 2020-04-20 DIAGNOSIS — Z170 Estrogen receptor positive status [ER+]: Secondary | ICD-10-CM | POA: Diagnosis not present

## 2020-04-20 DIAGNOSIS — G4733 Obstructive sleep apnea (adult) (pediatric): Secondary | ICD-10-CM | POA: Diagnosis not present

## 2020-04-20 DIAGNOSIS — D0512 Intraductal carcinoma in situ of left breast: Secondary | ICD-10-CM

## 2020-04-20 DIAGNOSIS — I10 Essential (primary) hypertension: Secondary | ICD-10-CM

## 2020-04-20 LAB — METANEPHRINES, PLASMA
Metanephrine, Free: 38.5 pg/mL (ref 0.0–88.0)
Normetanephrine, Free: 155.2 pg/mL (ref 0.0–285.2)

## 2020-04-20 NOTE — Assessment & Plan Note (Signed)
Treatment plan per oncology, overall doing well Has good support system

## 2020-04-20 NOTE — Assessment & Plan Note (Signed)
bp at her baseline, no changes

## 2020-04-20 NOTE — Assessment & Plan Note (Signed)
Continue lasix, edema has improved

## 2020-04-20 NOTE — Assessment & Plan Note (Signed)
She is using CPAP regulary now

## 2020-04-20 NOTE — Progress Notes (Signed)
   Subjective:    [name], female    DOB: [birth year], 64 y.o.   MRN: [id number]  Patient presents for Follow-up (is not fasting)  Pt here to f/u  She is followed by oncology for her breast cancer.  She is currently getting radiation treatments.  She has had some swelling and irritation from breast that is getting radiated and given her topical ointment to use.  She has not had any drainage or odor.  She has not required any pain medicine at this time.  Chronic back pain with radiation- that has improved, if she moves a certain way will have pain in the back of the leg , but if she gets up or moves around it helps some  She will get the pins and needles on occ   DM- last A1C 5.9% she is taking her medicines as prescribed and regular.  Hypothyroidism seen by Dr. Dorris Fetch, no chane in dose, TFT at goal now.  States that she is consistent with all of her medications.  No new concerns today.  She does feel like she has a good support system with her family and friends and church members.  She is not on the Lexapro at this time.   Review Of Systems:  GEN- denies fatigue, fever, weight loss,weakness, recent illness HEENT- denies eye drainage, change in vision, nasal discharge, CVS- denies chest pain, palpitations RESP- denies SOB, cough, wheeze ABD- denies N/V, change in stools, abd pain GU- denies dysuria, hematuria, dribbling, incontinence MSK- denies joint pain, muscle aches, injury Neuro- denies headache, dizziness, syncope, seizure activity       Objective:    BP (!) 146/78   Pulse 88   Temp 98.7 F (37.1 C) (Temporal)   Resp 16   Ht 5\' 3"  (1.6 m)   Wt 261 lb (118.4 kg)   SpO2 92%   BMI 46.23 kg/m  GEN- NAD, alert and oriented x3 HEENT- PERRL, EOMI, non injected sclera, pink conjunctiva, MMM, oropharynx clear Neck- Supple, no thyromegaly CVS- RRR, 3 /6 systolic murmur RESP-CTAB ABD-NABS,soft, NT,ND EXT- 1+ nonpitting edema ankles  L >R Pulses- Radial,  DP-palpated         Assessment & Plan:      No changes today    Vaccine after her last radiation treatment in 3 weeks  Problem List Items Addressed This Visit      Unprioritized   Diabetes mellitus, type II ([place])    Controlled no changes to Metformin       Diastolic dysfunction    Continue lasix, edema has improved       Ductal carcinoma in situ (DCIS) of left breast    Treatment plan per oncology, overall doing well Has good support system       Essential hypertension, benign - Primary    bp at her baseline, no changes       OSA (obstructive sleep apnea)    She is using CPAP regulary now          Note: This dictation was prepared with Dragon dictation along with smaller phrase technology. Any transcriptional errors that result from this process are unintentional.

## 2020-04-20 NOTE — Assessment & Plan Note (Signed)
Controlled no changes to Metformin

## 2020-04-20 NOTE — Patient Instructions (Addendum)
F/U 2 months - Jan/Feb

## 2020-04-21 ENCOUNTER — Ambulatory Visit
Admission: RE | Admit: 2020-04-21 | Discharge: 2020-04-21 | Disposition: A | Discharge status: Home / Self Care | Payer: Medicare Other | Source: Ambulatory Visit | Attending: Radiation Oncology | Admitting: Radiation Oncology

## 2020-04-21 ENCOUNTER — Ambulatory Visit: Payer: Medicare Other

## 2020-04-21 DIAGNOSIS — D0512 Intraductal carcinoma in situ of left breast: Secondary | ICD-10-CM | POA: Diagnosis not present

## 2020-04-21 DIAGNOSIS — Z170 Estrogen receptor positive status [ER+]: Secondary | ICD-10-CM | POA: Diagnosis not present

## 2020-04-22 ENCOUNTER — Ambulatory Visit
Admission: RE | Admit: 2020-04-22 | Discharge: 2020-04-22 | Disposition: A | Discharge status: Home / Self Care | Payer: Medicare Other | Source: Ambulatory Visit | Attending: Radiation Oncology | Admitting: Radiation Oncology

## 2020-04-22 ENCOUNTER — Ambulatory Visit: Payer: Medicare Other

## 2020-04-22 DIAGNOSIS — D0512 Intraductal carcinoma in situ of left breast: Secondary | ICD-10-CM | POA: Insufficient documentation

## 2020-04-22 DIAGNOSIS — Z170 Estrogen receptor positive status [ER+]: Secondary | ICD-10-CM | POA: Diagnosis not present

## 2020-04-23 ENCOUNTER — Ambulatory Visit: Payer: Medicare Other

## 2020-04-23 ENCOUNTER — Ambulatory Visit
Admission: RE | Admit: 2020-04-23 | Discharge: 2020-04-23 | Disposition: A | Discharge status: Home / Self Care | Payer: Medicare Other | Source: Ambulatory Visit | Attending: Radiation Oncology | Admitting: Radiation Oncology

## 2020-04-23 DIAGNOSIS — D0512 Intraductal carcinoma in situ of left breast: Secondary | ICD-10-CM | POA: Diagnosis not present

## 2020-04-23 DIAGNOSIS — Z170 Estrogen receptor positive status [ER+]: Secondary | ICD-10-CM | POA: Diagnosis not present

## 2020-04-24 ENCOUNTER — Ambulatory Visit: Payer: Medicare Other

## 2020-04-24 ENCOUNTER — Encounter: Payer: Self-pay | Admitting: *Deleted

## 2020-04-24 ENCOUNTER — Ambulatory Visit
Admission: RE | Admit: 2020-04-24 | Discharge: 2020-04-24 | Disposition: A | Discharge status: Home / Self Care | Payer: Medicare Other | Source: Ambulatory Visit | Attending: Radiation Oncology | Admitting: Radiation Oncology

## 2020-04-24 DIAGNOSIS — D0512 Intraductal carcinoma in situ of left breast: Secondary | ICD-10-CM | POA: Diagnosis not present

## 2020-04-24 DIAGNOSIS — Z170 Estrogen receptor positive status [ER+]: Secondary | ICD-10-CM | POA: Diagnosis not present

## 2020-04-27 ENCOUNTER — Ambulatory Visit: Payer: Medicare Other

## 2020-04-27 ENCOUNTER — Encounter: Payer: Self-pay | Admitting: Radiation Oncology

## 2020-04-27 ENCOUNTER — Ambulatory Visit
Admission: RE | Admit: 2020-04-27 | Discharge: 2020-04-27 | Disposition: A | Discharge status: Home / Self Care | Payer: Medicare Other | Source: Ambulatory Visit | Attending: Radiation Oncology | Admitting: Radiation Oncology

## 2020-04-27 DIAGNOSIS — Z170 Estrogen receptor positive status [ER+]: Secondary | ICD-10-CM | POA: Diagnosis not present

## 2020-04-27 DIAGNOSIS — D0512 Intraductal carcinoma in situ of left breast: Secondary | ICD-10-CM | POA: Diagnosis not present

## 2020-04-28 ENCOUNTER — Ambulatory Visit: Payer: Medicare Other

## 2020-05-01 ENCOUNTER — Encounter: Payer: Self-pay | Admitting: Family Medicine

## 2020-05-01 ENCOUNTER — Ambulatory Visit (INDEPENDENT_AMBULATORY_CARE_PROVIDER_SITE_OTHER): Payer: Medicare Other | Admitting: Family Medicine

## 2020-05-01 ENCOUNTER — Other Ambulatory Visit: Payer: Self-pay

## 2020-05-01 VITALS — BP 138/86 | HR 94 | Temp 97.8°F | Resp 14 | Ht 63.0 in | Wt 259.0 lb

## 2020-05-01 DIAGNOSIS — L0292 Furuncle, unspecified: Secondary | ICD-10-CM | POA: Diagnosis not present

## 2020-05-01 DIAGNOSIS — L589 Radiodermatitis, unspecified: Secondary | ICD-10-CM

## 2020-05-01 MED ORDER — SULFAMETHOXAZOLE-TRIMETHOPRIM 800-160 MG PO TABS: 1.0000 | ORAL_TABLET | Freq: Two times a day (BID) | ORAL | 0 refills | Status: DC

## 2020-05-01 NOTE — Patient Instructions (Addendum)
Take the antibiotics  F/U as needed

## 2020-05-01 NOTE — Progress Notes (Signed)
   Subjective:    [name], female    DOB: 1955-06-06, 64 y.o.   MRN: [id number]  Patient presents for Rash (Irritation and open areas under breast- using cream and deodorant from oncology)    Pt here with rash beneath her bresat , she has finished her radiation treatment to the breast. Her skin has cracked open but she felt the begnning of a boil above the left nipple that was a little tender. She also has some moisture beneath the breast. No drainage from the nipple She has been using the radiation cream they prescribed 2-3 times a day, using the topical cleanser given to her and peroxide      Review Of Systems:  GEN- denies fatigue, fever, weight loss,weakness, recent illness HEENT- denies eye drainage, change in vision, nasal discharge, CVS- denies chest pain, palpitations RESP- denies SOB, cough, wheeze ABD- denies N/V, change in stools, abd pain Neuro- denies headache, dizziness, syncope, seizure activity       Objective:    BP 138/86   Pulse 94   Temp 97.8 F (36.6 C) (Temporal)   Resp 14   Ht 5\' 3"  (1.6 m)   Wt 259 lb (117.5 kg)   SpO2 99%   BMI 45.88 kg/m  GEN- NAD, alert and oriented x3 Neck- supple, no thyromegaly Breast- normal symmetry, no nipple inversion,no nipple drainage, hyperpigmentation to outer left breast with cracks in skin folds, small superficial ulcerations, maceration beneath breast, small tender nodule witmild fluctance  Nodes- no axillary nodes CVS- RRR, 3/6 SEM RESP-CTAB Ext- chronic edema        Assessment & Plan:      Problem List Items Addressed This Visit   None   Visit Diagnoses    Radiation dermatitis    -  Primary   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 Any transcriptional errors that result from this process are unintentional.  \

## 2020-05-03 NOTE — Progress Notes (Signed)
[place]  [phone] [phone]     ID: Frances Hughes DOB: 1956/02/24  MR#: [id number]  ZHY#:[id number]  Patient Care Team: Frances Rossetti, MD as PCP - General (Family Medicine) Harl Bowie, Alphonse Guild, MD as PCP - Cardiology (Cardiology) Gala Romney Cristopher Estimable, MD as Consulting Physician (Gastroenterology) Mauro Kaufmann, RN as Oncology Nurse Navigator Rockwell Germany, RN as Oncology Nurse Navigator Shevonne Wolf, Virgie Dad, MD as Consulting Physician (Oncology) Kyung Rudd, MD as Consulting Physician (Radiation Oncology) [name], MD as Consulting Physician (General Surgery) Frances Cruel, MD OTHER MD:  CHIEF COMPLAINT: noninvasive breast cancer, estrogen receptor positive  CURRENT TREATMENT:    INTERVAL HISTORY: Frances Hughes returns today for follow up of her noninvasive breast cancer. She was evaluated in the breast cancer clinic on 03/02/2020.  She is accompanied by her husband  She was referred back to Frances Hughes on 03/19/2020 to discuss radiation therapy. She received treatment from 03/30/2020 through 04/27/2020.  She did well until towards the end and then she developed significant desquamation  Of note, she also underwent abdomen MRI on 03/19/2020 showing: stable to mildly increased left adrenal nodule with signal characteristics compatible with benign adenoma; bilateral Hughes cysts.   REVIEW OF SYSTEMS: Frances Hughes    COVID 19 VACCINATION STATUS: fully vaccinated Frances Hughes), most recent dose March 2021   HISTORY OF CURRENT ILLNESS: From the original intake note:  Frances Hughes had routine screening mammography on 12/12/2019 showing a possible abnormality in the left breast. She underwent left diagnostic mammography with tomography at [place] on 12/24/2019 showing: breast density category B; upper-outer left breast calcifications spanning 1.5 cm.  Accordingly on 01/06/2020 she proceeded to biopsy of the left breast area in question.  The pathology from this procedure ([id number]) showed: ductal carcinoma in situ, low grade, with calcifications. Prognostic indicators significant for: estrogen receptor, 100% positive with strong staining intensity and progesterone receptor, 60% positive with weak staining intensity.   She opted to proceed with lumpectomy on 02/19/2020 under Dr. Ninfa Hughes. Pathology from the procedure [id number]) showed: ductal carcinoma in situ, low to intermediate grade, 1.5 cm, with calcifications and focal necrosis; lobular neoplasia; margins not involved.  The patient's subsequent history is as detailed below.   PAST MEDICAL HISTORY: Past Medical History:  Diagnosis Date  . Adrenal mass, left ([place])   . Degenerative disc disease at L5-S1 level   . 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 Enlarged heart   . Family history of breast cancer   . Family history of colon cancer   . Family history of pancreatic cancer   . Family history of stomach cancer   . Grave's disease   . HTN (hypertension)   . Hyperlipidemia   . Lymphocytosis   . Nephrolithiasis   . OSA on CPAP     PAST SURGICAL HISTORY: Past Surgical History:  Procedure Laterality Date  . ABDOMINAL HYSTERECTOMY    . BREAST BIOPSY Left 01/06/2020  . BREAST LUMPECTOMY WITH RADIOACTIVE SEED LOCALIZATION Left 02/19/2020   Procedure: LEFT BREAST LUMPECTOMY WITH RADIOACTIVE SEED LOCALIZATION;  Surgeon: [name], MD;  Location: [facility];  Service: General;  Laterality: Left;  . COLONOSCOPY  2011   normal colonoscopy  . COLONOSCOPY WITH PROPOFOL N/A 04/16/2018   Procedure: COLONOSCOPY WITH PROPOFOL;  Surgeon: Daneil Dolin, MD;  [facility];  Service: Endoscopy;  Laterality: N/A;  9:45am  . HERNIA REPAIR  2011  . LITHOTRIPSY    .  POLYPECTOMY  04/16/2018   Procedure: POLYPECTOMY;  Surgeon: Daneil Dolin, MD;  [facility];  Service: Endoscopy;;  (colon)  . S/P Hysterectomy   10/1996   with bilat SOO seconardt to fibroids  . thyroid ablation  1986    FAMILY HISTORY: Family History  Problem Relation Age of Onset  . Colon cancer Mother        d. 6  . Cancer Mother        metastisis sarcoma  . Colon cancer Sister 94  . Head & neck cancer Sister        roof of mouth  . Colon polyps Sister   . Diabetes Father   . Hypertension Father   . Cancer Father   . Lung cancer Father        d. 75  . Colon polyps Sister   . Esophageal cancer Brother   . Cancer Brother        cancer of eye  . Aneurysm Maternal Aunt        brain  . Cancer Maternal Uncle        NOS  . COPD Maternal Uncle   . Stomach cancer Maternal Grandfather 81  . Heart attack Maternal Grandfather   . Pancreatic cancer Paternal Grandmother        d. 103  . Cancer Maternal Uncle        NOS - mother's pat 1/2 brother  . Breast cancer Cousin        3 maternal cousins with breast cancer, some under 15, all different branches  . Colon cancer Cousin        five mat first cousins, 4 were siblings  . Cancer Cousin        2 mat 1/2 cousins with NOS cancer  The patient's father died from lung cancer at the age of 53.  He worked in [place] and also smoked.  The patient's mother had "cancer all over her abdomen" and died from bowel perforation.  The patient tells me she has 4 cousins with colon cancer and a sister who died from colon cancer.  One brother died from esophageal cancer.   GYNECOLOGIC HISTORY:  No LMP recorded. Patient has had a hysterectomy. Menarche: 64 years old Age at first live birth: 64 years old GX P 4 HRT no  Hysterectomy? Yes, 10/1996, due to fibroids BSO? 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  Her husband Frances Hughes is retired from [employer].  They have twin daughters, Frances Hughes who works as an Web designer in Child psychotherapist who works in [place] for the [employer] and  [employer], both 80 years old; 3d daughter is Frances Hughes who works in Psychologist, educational, age 60; their son Frances Hughes died from "colloid cysts in the brain" at the age of 12.  The patient has 6 grandchildren.  She is a Psychologist, forensic    ADVANCED DIRECTIVES: In the absence of any documents to the contrary the patient's husband is her healthcare power of attorney.   HEALTH MAINTENANCE: Social History   Tobacco Use  . Smoking status: Former Smoker    Packs/day: 0.75    Years: 10.00    Pack years: 7.50    Types: Cigarettes    Start date: 01/30/1969    Quit date: 05/23/1988    Years since quitting: 31.9  . Smokeless tobacco: Never Used  Substance Use Topics  . Alcohol  use: No    Alcohol/week: 0.0 standard drinks  . Drug use: No     Colonoscopy: 03/2018 (Dr. Gala Romney), repeat recommended 2022  PAP: 01/2012, negative  Bone density: none on file   No Known Allergies  Current Outpatient Medications  Medication Sig Dispense Refill  . acetaminophen (TYLENOL) 650 MG CR tablet Take 1,300 mg by mouth 2 (two) times daily as needed for pain.    . Blood Glucose Monitoring Suppl (BLOOD GLUCOSE MONITOR SYSTEM) W/DEVICE KIT Please dispense based on patient and insurance preference. Use to monitor fasting blood sugar 1x daily. Dx: E11.9 1 each 0  . cloNIDine (CATAPRES) 0.2 MG tablet Take 1 tablet (0.2 mg total) by mouth 2 (two) times daily. 60 tablet 3  . furosemide (LASIX) 20 MG tablet Take 20 mg by mouth daily with breakfast.     . Glucose Blood (BLOOD GLUCOSE TEST STRIPS) STRP Please dispense based on patient and insurance preference. Use to monitor fasting blood sugar 1x daily. Dx: E11.9 100 each 0  . hydrALAZINE (APRESOLINE) 50 MG tablet Take 1 tablet (50 mg total) by mouth 3 (three) times daily. 90 tablet 3  . Lancets 28G MISC Please dispense based on patient and insurance preference. Use to monitor fasting blood sugar 1x daily. Dx: E11.9 100 each 0  . levothyroxine (SYNTHROID) 200 MCG tablet TAKE 1 TABLET BY MOUTH  EVERY DAY BEFORE BREAKFAST (Patient taking differently: Take 200 mcg by mouth daily before breakfast. ) 30 tablet 3  . losartan (COZAAR) 100 MG tablet Take 1 tablet (100 mg total) by mouth daily. 90 tablet 3  . metFORMIN (GLUCOPHAGE) 500 MG tablet TAKE 1 TABLET BY MOUTH TWICE DAILY WITH A MEAL (Patient taking differently: Take 500 mg by mouth 2 (two) times daily with a meal. ) 180 tablet 1  . potassium chloride SA (KLOR-CON) 20 MEQ tablet Take 1 tablet (20 mEq total) by mouth daily. 90 tablet 1  . spironolactone (ALDACTONE) 25 MG tablet Take 0.5 tablets (12.5 mg total) by mouth daily. 30 tablet 2  . sulfamethoxazole-trimethoprim (BACTRIM DS) 800-160 MG tablet Take 1 tablet by mouth 2 (two) times daily. 14 tablet 0   No current facility-administered medications for this visit.    OBJECTIVE: African-American woman who appears stated age  64:   05/04/20 1421  BP: (!) 145/79  Pulse: 92  Resp: 18  Temp: 97.6 F (36.4 C)  SpO2: 94%     Body mass index is 45.68 kg/m.   Wt Readings from Last 3 Encounters:  05/04/20 257 lb 14.4 oz (117 kg)  05/01/20 259 lb (117.5 kg)  04/20/20 261 lb (118.4 kg)   Repeat blood pressure after 20 mg of Lasix was 240/115.    ECOG FS:2 - Symptomatic, <50% confined to bed  Sclerae unicteric, EOMs intact Wearing a mask No cervical or supraclavicular adenopathy Lungs no rales or rhonchi Heart regular rate and rhythm Abd soft, nontender, positive bowel sounds MSK no focal spinal tenderness, no upper extremity lymphedema Neuro: nonfocal, well oriented, appropriate affect Breasts: The right breast is benign.  The left breast is status post recent radiation.  There is significant hyperpigmentation and in the upper outer quadrant significant with desquamation.   LAB RESULTS:  CMP     Component Value Date/Time   NA 142 04/13/2020 1216   K 3.5 04/13/2020 1216   CL 102 04/13/2020 1216   CO2 32 04/13/2020 1216   GLUCOSE 88 04/13/2020 1216   BUN 16  04/13/2020 1216  CREATININE 0.68 04/13/2020 1216   CREATININE 0.74 03/02/2020 1526   CREATININE 0.88 12/18/2019 0847   CALCIUM 8.9 04/13/2020 1216   PROT 7.5 04/13/2020 1216   ALBUMIN 3.5 04/13/2020 1216   AST 14 (L) 04/13/2020 1216   AST 17 03/02/2020 1526   ALT 13 04/13/2020 1216   ALT 13 03/02/2020 1526   ALKPHOS 66 04/13/2020 1216   BILITOT 0.4 04/13/2020 1216   BILITOT 0.3 03/02/2020 1526   GFRNONAA >60 04/13/2020 1216   GFRNONAA >60 03/02/2020 1526   GFRNONAA 94 11/07/2018 0927   GFRAA >60 02/17/2020 0930   GFRAA 109 11/07/2018 0927    No results found for: TOTALPROTELP, ALBUMINELP, A1GS, A2GS, BETS, BETA2SER, GAMS, MSPIKE, SPEI  Lab Results  Component Value Date   WBC 8.0 03/05/2020   NEUTROABS 4.7 03/05/2020   HGB 12.5 03/05/2020   HCT 40.2 03/05/2020   MCV 83.8 03/05/2020   PLT 239 03/05/2020    No results found for: LABCA2  No components found for: [id number]  No results for input(s): INR in the last 168 hours.  No results found for: LABCA2  No results found for: CLE751  No results found for: ZGY174  No results found for: BSW967  No results found for: CA2729  No components found for: HGQUANT  No results found for: CEA1 / No results found for: CEA1   No results found for: AFPTUMOR  No results found for: CHROMOGRNA  No results found for: KPAFRELGTCHN, LAMBDASER, KAPLAMBRATIO (kappa/lambda light chains)  No results found for: HGBA, HGBA2QUANT, HGBFQUANT, HGBSQUAN (Hemoglobinopathy evaluation)   No results found for: LDH  No results found for: IRON, TIBC, IRONPCTSAT (Iron and TIBC)  No results found for: FERRITIN  Urinalysis    Component Value Date/Time   COLORURINE YELLOW 07/17/2015 [place] 07/17/2015 1234   LABSPEC 1.020 07/17/2015 1234   PHURINE 6.5 07/17/2015 1234   GLUCOSEU NEGATIVE 07/17/2015 1234   HGBUR 2+ (A) 07/17/2015 1234   HGBUR trace-intact 06/13/2008 [place] 07/17/2015 1234    KETONESUR NEGATIVE 07/17/2015 1234   PROTEINUR NEGATIVE 07/17/2015 1234   UROBILINOGEN 0.2 06/13/2008 1045   NITRITE NEGATIVE 07/17/2015 [place] 07/17/2015 1234    STUDIES: No results found.   ELIGIBLE FOR AVAILABLE RESEARCH PROTOCOL: Opted against COMET  ASSESSMENT: 64 y.o. [place], Young woman status post left breast biopsy 01/06/2020 for ductal carcinoma in situ, grade 1, estrogen and progesterone receptor positive  (1) status post left lumpectomy 02/19/2020 for ductal carcinoma in situ grade 1 or 2, 1.5 cm, with negative margins.  (2) adjuvant radiation completed 04/27/2020  (3) to start anastrozole 06/09/2019  (4) genetics counseling 05/04/2020--patient is considering testing   PLAN: Katlynne is still recovering from her radiation and has significant wet desquamation.  We discussed management of that and if she has a lot of pain she will take Tylenol and Aleve together although at present she is not taking anything and she is reluctant to take any pain medicine.  We discussed exercise and once she is feeling a little better they are planning to use what is available to them at the gym particularly something that will spare Madlyn's knees.  We discussed antiestrogens and she understands these will cut the risk of breast cancer in half, both the risk of this cancer recurring and the risk of a new breast cancer developing.  We specifically discussed the possible toxicities side effects and complications of anastrozole and I have placed the prescription  in for her.  Tentatively she will start 06/09/2019.  I will see her in April to make sure she is tolerating this well.  If she is we will switch to yearly visits  Total encounter time 25 minutes.Sarajane Jews C. Farzad Tibbetts, MD 05/04/2020 3:08 PM Medical Oncology and Hematology [facility] [place], [place] [address] Tel. [phone]    Fax. [phone]   This document serves as a record  of services personally performed by Lurline Del, MD. It was created on his behalf by Wilburn Mylar, a trained medical scribe. The creation of this record is based on the scribe's personal observations and the provider's statements to them.   I, Lurline Del MD, have reviewed the above documentation for accuracy and completeness, and I agree with the above.   *Total Encounter Time as defined by the Centers for Medicare and Medicaid Services includes, in addition to the face-to-face time of a patient visit (documented in the note above) non-face-to-face time: obtaining and reviewing outside history, ordering and reviewing medications, tests or procedures, care coordination (communications with other health care professionals or caregivers) and documentation in the medical record.

## 2020-05-04 ENCOUNTER — Encounter: Payer: Self-pay | Admitting: Genetic Counselor

## 2020-05-04 ENCOUNTER — Inpatient Hospital Stay: Payer: Medicare Other

## 2020-05-04 ENCOUNTER — Inpatient Hospital Stay: Payer: Medicare Other | Attending: Oncology | Admitting: Genetic Counselor

## 2020-05-04 ENCOUNTER — Other Ambulatory Visit: Payer: Self-pay

## 2020-05-04 ENCOUNTER — Other Ambulatory Visit: Payer: Medicare Other

## 2020-05-04 ENCOUNTER — Inpatient Hospital Stay (HOSPITAL_BASED_OUTPATIENT_CLINIC_OR_DEPARTMENT_OTHER): Payer: Medicare Other | Admitting: Oncology

## 2020-05-04 ENCOUNTER — Encounter: Payer: Medicare Other | Admitting: Genetic Counselor

## 2020-05-04 VITALS — BP 145/79 | HR 92 | Temp 97.6°F | Resp 18 | Ht 63.0 in | Wt 257.9 lb

## 2020-05-04 DIAGNOSIS — Z87891 Personal history of nicotine dependence: Secondary | ICD-10-CM | POA: Insufficient documentation

## 2020-05-04 DIAGNOSIS — Z808 Family history of malignant neoplasm of other organs or systems: Secondary | ICD-10-CM | POA: Diagnosis not present

## 2020-05-04 DIAGNOSIS — Z79811 Long term (current) use of aromatase inhibitors: Secondary | ICD-10-CM | POA: Diagnosis not present

## 2020-05-04 DIAGNOSIS — Z809 Family history of malignant neoplasm, unspecified: Secondary | ICD-10-CM | POA: Diagnosis not present

## 2020-05-04 DIAGNOSIS — Z801 Family history of malignant neoplasm of trachea, bronchus and lung: Secondary | ICD-10-CM | POA: Diagnosis not present

## 2020-05-04 DIAGNOSIS — Z803 Family history of malignant neoplasm of breast: Secondary | ICD-10-CM | POA: Diagnosis not present

## 2020-05-04 DIAGNOSIS — Z800 Family history of malignant neoplasm of digestive organs: Secondary | ICD-10-CM | POA: Diagnosis not present

## 2020-05-04 DIAGNOSIS — D0512 Intraductal carcinoma in situ of left breast: Secondary | ICD-10-CM

## 2020-05-04 DIAGNOSIS — Z90710 Acquired absence of both cervix and uterus: Secondary | ICD-10-CM | POA: Insufficient documentation

## 2020-05-04 DIAGNOSIS — Z86010 Personal history of colonic polyps: Secondary | ICD-10-CM | POA: Diagnosis not present

## 2020-05-04 DIAGNOSIS — Z923 Personal history of irradiation: Secondary | ICD-10-CM | POA: Diagnosis not present

## 2020-05-04 DIAGNOSIS — Z170 Estrogen receptor positive status [ER+]: Secondary | ICD-10-CM | POA: Diagnosis not present

## 2020-05-04 MED ORDER — ANASTROZOLE 1 MG PO TABS: 1.0000 mg | ORAL_TABLET | Freq: Every day | ORAL | 4 refills | Status: DC

## 2020-05-04 NOTE — Progress Notes (Signed)
REFERRING PROVIDER: Alycia Rossetti, MD [address] [place],  [place] [address]  PRIMARY PROVIDER:  Alycia Rossetti, MD  PRIMARY REASON FOR VISIT:  1. Ductal carcinoma in situ (DCIS) of left breast   2. Family history of colon cancer   3. Family history of breast cancer   4. Family history of pancreatic cancer   5. Family history of stomach cancer      HISTORY OF PRESENT ILLNESS:   Frances Hughes, a 64 y.o. female, was seen for a [facility] cancer genetics consultation at the request of Dr. Buelah Manis due to a personal and family history of cancer.  Frances Hughes presents to clinic today to discuss the possibility of a hereditary predisposition to cancer, genetic testing, and to further clarify her future cancer risks, as well as potential cancer risks for family members.   In September 2021, at the age of 35, Frances Hughes was diagnosed with DCIS of the left breast. The treatment plan lumpectomy and radiation.  She has a history of colon polyps and is being screened every 3 years with colonoscopies.    CANCER HISTORY:  Oncology History   No history exists.     RISK FACTORS:  Menarche was at age 18.  First live birth at age 62.  OCP use for approximately >5 years.  Ovaries intact: no.  Hysterectomy: yes.  Menopausal status: postmenopausal.  HRT use: 0 years. Colonoscopy: yes; some polyps. Mammogram within the last year: yes. Number of breast biopsies: 1. Up to date with pelvic exams: no. Any excessive radiation exposure in the past: yes  Past Medical History:  Diagnosis Date  . Adrenal mass, left ([place])   . Degenerative disc disease at L5-S1 level   . DM (diabetes mellitus) ([place])    Type II controlled  . Enlarged heart   . Family history of breast cancer   . Family history of colon cancer   . Family history of pancreatic cancer   . Family history of stomach cancer   . Grave's disease   . HTN (hypertension)   . Hyperlipidemia   . Lymphocytosis   .  Nephrolithiasis   . OSA on CPAP     Past Surgical History:  Procedure Laterality Date  . ABDOMINAL HYSTERECTOMY    . BREAST BIOPSY Left 01/06/2020  . BREAST LUMPECTOMY WITH RADIOACTIVE SEED LOCALIZATION Left 02/19/2020   Procedure: LEFT BREAST LUMPECTOMY WITH RADIOACTIVE SEED LOCALIZATION;  Surgeon: [name], MD;  Location: [facility];  Service: General;  Laterality: Left;  . COLONOSCOPY  2011   normal colonoscopy  . COLONOSCOPY WITH PROPOFOL N/A 04/16/2018   Procedure: COLONOSCOPY WITH PROPOFOL;  Surgeon: Daneil Dolin, MD;  Location[facility];  Service: Endoscopy;  Laterality: N/A;  9:45am  . HERNIA REPAIR  2011  . LITHOTRIPSY    . POLYPECTOMY  04/16/2018   Procedure: POLYPECTOMY;  Surgeon: Daneil Dolin, MD;  Location: [facility];  Service: Endoscopy;;  (colon)  . S/P Hysterectomy  10/1996   with bilat SOO seconardt to fibroids  . thyroid ablation  1986    Social History   Socioeconomic History  . Marital status: Married    Spouse name: Not on file  . Number of children: Not on file  . Years of education: Not on file  . Highest education level: Not on file  Occupational History  . Not on file  Tobacco Use  . Smoking status: Former Smoker    Packs/day:  0.75    Years: 10.00    Pack years: 7.50    Types: Cigarettes    Start date: 01/30/1969    Quit date: 05/23/1988    Years since quitting: 31.9  . Smokeless tobacco: Never Used  Substance and Sexual Activity  . Alcohol use: No    Alcohol/week: 0.0 standard drinks  . Drug use: No  . Sexual activity: Yes  Other Topics Concern  . Not on file  Social History Narrative  . Not on file   Social Determinants of Health   Financial Resource Strain: Not on file  Food Insecurity: Not on file  Transportation Needs: Not on file  Physical Activity: Not on file  Stress: Not on file  Social Connections: Not on file     FAMILY HISTORY:  We obtained a detailed, 4-generation family history.   Significant diagnoses are listed below: Family History  Problem Relation Age of Onset  . Colon cancer Mother        d. 71  . Cancer Mother        metastisis sarcoma  . Colon cancer Sister 3  . Head & neck cancer Sister        roof of mouth  . Colon polyps Sister   . Diabetes Father   . Hypertension Father   . Cancer Father   . Lung cancer Father        d. 24  . Colon polyps Sister   . Esophageal cancer Brother   . Cancer Brother        cancer of eye  . Aneurysm Maternal Aunt        brain  . Cancer Maternal Uncle        NOS  . COPD Maternal Uncle   . Stomach cancer Maternal Grandfather 33  . Heart attack Maternal Grandfather   . Pancreatic cancer Paternal Grandmother        d. 45  . Cancer Maternal Uncle        NOS - mother's pat 1/2 brother  . Breast cancer Cousin        3 maternal cousins with breast cancer, some under 35, all different branches  . Colon cancer Cousin        five mat first cousins, 4 were siblings  . Cancer Cousin        2 mat 1/2 cousins with NOS cancer    The patient has three daughters and a son.  Her son died of a type of cyst on the brain.  She had one brother and four sisters.  Her brother died of esophageal cancer and one sister had colon cancer at 54 and died.  Another sister has an oral cancer on the roof of her mouth.  Both parents are deceased.  The patient's mother had colon cancer at 17 and died of metastasis sarcoma'.  She had two full brothers, two full sisters and a paternal half brother.  One full brother had COPD and a cancer NOS.  He had a daughter who had breast cancer in her 29's.  A sister had a son with colon cancer and a daughter who had breast cancer under 40.  The other sister had three sons and a daughter who had colon cancer and one daughter who had breast cancer.  The half brother had an unknown cancer and he had two sons with unknown cancer. The grandfather had stomach cancer and the grandmother died in childbirth.  The  patient's father died of lung  cancer at 26.  He was an only child.  His mother had pancreatic cancer.  Frances Hughes is unaware of previous family history of genetic testing for hereditary cancer risks. Patient's maternal ancestors are of African American descent, and paternal ancestors are of African American descent. There is no reported Ashkenazi Jewish ancestry. There is no known consanguinity.  GENETIC COUNSELING ASSESSMENT: Frances Hughes is a 64 y.o. female with a personal and family history of cancer which is somewhat suggestive of a hereditary cancer syndrome and predisposition to cancer given the number of cases of cancer in the family, combination of cancer and some young ages of onset.. We, therefore, discussed and recommended the following at today's visit.   DISCUSSION: We discussed that 5 - 10% of breast cancer is hereditary, with most cases associated with BRCA mutations.  There are other genes that can be associated with hereditary breast cancer syndromes.  These include ATM, CHEK2 and PALB2.  Based on the family history of colon cancer and the patient's history of colon polyps, I am more concerned about a risk for Lynch syndrome.  We discussed that Lynch syndrome typically has colon and gynecological cancers running in the family, but that some families also have breast cancer.  We discussed that testing is beneficial for several reasons including knowing how to follow individuals after completing their treatment,and understand if other family members could be at risk for cancer and allow them to undergo genetic testing.   We reviewed the characteristics, features and inheritance patterns of hereditary cancer syndromes. We also discussed genetic testing, including the appropriate family members to test, the process of testing, insurance coverage and turn-around-time for results. We discussed the implications of a negative, positive, carrier and/or variant of uncertain significant result. We  recommended Frances Hughes pursue genetic testing for the CancerNext-Expanded+RNAinsight gene panel. The CancerNext-Expanded gene panel offered by [facility] and includes sequencing and rearrangement analysis for the following 77 genes: AIP, ALK, APC*, ATM*, AXIN2, BAP1, BARD1, BLM, BMPR1A, BRCA1*, BRCA2*, BRIP1*, CDC73, CDH1*, CDK4, CDKN1B, CDKN2A, CHEK2*, CTNNA1, DICER1, FANCC, FH, FLCN, GALNT12, KIF1B, LZTR1, MAX, MEN1, MET, MLH1*, MSH2*, MSH3, MSH6*, MUTYH*, NBN, NF1*, NF2, NTHL1, PALB2*, PHOX2B, PMS2*, POT1, PRKAR1A, PTCH1, PTEN*, RAD51C*, RAD51D*, RB1, RECQL, RET, SDHA, SDHAF2, SDHB, SDHC, SDHD, SMAD4, SMARCA4, SMARCB1, SMARCE1, STK11, SUFU, TMEM127, TP53*, TSC1, TSC2, VHL and XRCC2 (sequencing and deletion/duplication); EGFR, EGLN1, HOXB13, KIT, MITF, PDGFRA, POLD1, and POLE (sequencing only); EPCAM and GREM1 (deletion/duplication only). DNA and RNA analyses performed for * genes.   Based on Frances Hughes's personal and family history of cancer, she meets medical criteria for genetic testing. Despite that she meets criteria, she may still have an out of pocket cost. We discussed that if her out of pocket cost for testing is over $100, the laboratory will call and confirm whether she wants to proceed with testing.  If the out of pocket cost of testing is less than $100 she will be billed by the genetic testing laboratory.   PLAN: Despite our recommendation,  Frances Hughes did not wish to pursue genetic testing at today's visit. We understand this decision and remain available to coordinate genetic testing at any time in the future. We, therefore, recommend  Frances Hughes continue to follow the cancer screening guidelines given by her primary healthcare provider.  Lastly, we encouraged Frances Hughes to remain in contact with cancer genetics annually so that we can continuously update the family history and inform her of any changes in cancer genetics and testing that  may be of benefit for this family.    Frances Hughes questions were answered to her satisfaction today. Our contact information was provided should additional questions or concerns arise. Thank you for the referral and allowing [place] to share in the care of your patient.   Frances Hughes, [place], [facility] Licensed, Insurance risk surveyor [email or web address] phone: [phone]  The patient was seen for a total of 45 minutes in face-to-face genetic counseling.  This patient was discussed with Drs. Magrinat, Lindi Adie and/or Burr Medico who agrees with the above.    _______________________________________________________________________ For Office Staff:  Number of people involved in session: 2 Was an Intern/ student involved with case: no

## 2020-05-18 ENCOUNTER — Ambulatory Visit: Payer: Medicare Other | Admitting: "Endocrinology

## 2020-05-29 ENCOUNTER — Other Ambulatory Visit: Payer: Self-pay | Admitting: "Endocrinology

## 2020-05-29 ENCOUNTER — Other Ambulatory Visit: Payer: Self-pay | Admitting: Family Medicine

## 2020-06-08 ENCOUNTER — Telehealth: Payer: Self-pay | Admitting: Radiation Oncology

## 2020-06-08 NOTE — Telephone Encounter (Signed)
  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   Narrative:  The patient was contacted today for routine follow-up. During treatment she did very well with radiotherapy and did not have significant desquamation. She reports she is doing well, still having some hyperpigmentation. She otherwise feels well and has just started her antiestrogen pills today.   Impression/Plan: 1. ER/PR positive, low to intermediate grade DCIS with calcifications of the left breast. The patient has been doing well since completion of radiotherapy. We discussed that we would be happy to continue to follow her as needed, but she will also continue to follow up with Dr. Jana Hakim in medical oncology. She was counseled on skin care as well as measures to avoid sun exposure to this area.  2. Survivorship. We discussed the importance of survivorship evaluation and encouraged her to attend her upcoming visit with that clinic.    Carola Rhine, PAC

## 2020-06-12 ENCOUNTER — Encounter: Payer: Self-pay | Admitting: Family Medicine

## 2020-06-12 ENCOUNTER — Ambulatory Visit (INDEPENDENT_AMBULATORY_CARE_PROVIDER_SITE_OTHER): Payer: Medicare Other | Admitting: Family Medicine

## 2020-06-12 ENCOUNTER — Other Ambulatory Visit: Payer: Self-pay

## 2020-06-12 VITALS — BP 130/62 | HR 80 | Temp 98.3°F | Resp 14 | Ht 63.0 in | Wt 256.6 lb

## 2020-06-12 DIAGNOSIS — E038 Other specified hypothyroidism: Secondary | ICD-10-CM | POA: Diagnosis not present

## 2020-06-12 DIAGNOSIS — Z6841 Body Mass Index (BMI) 40.0 and over, adult: Secondary | ICD-10-CM

## 2020-06-12 DIAGNOSIS — G4733 Obstructive sleep apnea (adult) (pediatric): Secondary | ICD-10-CM

## 2020-06-12 DIAGNOSIS — F321 Major depressive disorder, single episode, moderate: Secondary | ICD-10-CM

## 2020-06-12 DIAGNOSIS — I10 Essential (primary) hypertension: Secondary | ICD-10-CM

## 2020-06-12 DIAGNOSIS — E1149 Type 2 diabetes mellitus with other diabetic neurological complication: Secondary | ICD-10-CM

## 2020-06-12 MED ORDER — AMLODIPINE BESYLATE 10 MG PO TABS: 10.0000 mg | ORAL_TABLET | Freq: Every day | ORAL | 3 refills | Status: DC

## 2020-06-12 MED ORDER — LOSARTAN POTASSIUM 100 MG PO TABS
100.0000 mg | ORAL_TABLET | Freq: Every day | ORAL | 3 refills | Status: DC
Start: 2020-06-12 — End: 2020-08-25

## 2020-06-12 MED ORDER — METFORMIN HCL 500 MG PO TABS
ORAL_TABLET | ORAL | 1 refills | Status: DC
Start: 2020-06-12 — End: 2022-05-05

## 2020-06-12 MED ORDER — FUROSEMIDE 20 MG PO TABS
20.0000 mg | ORAL_TABLET | Freq: Every day | ORAL | 1 refills | Status: DC | PRN
Start: 2020-06-12 — End: 2022-08-24

## 2020-06-12 NOTE — Patient Instructions (Signed)
[facility] - Dr Grier Mitts, Dr. Garret Reddish, Dr. Everlean Cherry [facility]- Dr. Posey Pronto

## 2020-06-12 NOTE — Assessment & Plan Note (Signed)
Controlled no changes  Reviewed meds, she has not been on aldactone but has been taking norvasc List corrected No further changes

## 2020-06-12 NOTE — Assessment & Plan Note (Signed)
Discussed need for regular use of CPAP to get most benefit from treatment

## 2020-06-12 NOTE — Assessment & Plan Note (Signed)
She is not on any meds Has support from family

## 2020-06-12 NOTE — Assessment & Plan Note (Signed)
Per endocrinology 

## 2020-06-12 NOTE — Assessment & Plan Note (Signed)
Has been well controlled, goal  , 7% She has not been taking the statin drug Recheck lipids

## 2020-06-12 NOTE — Progress Notes (Signed)
Subjective:    [name], female    DOB: [birth year], 65 y.o.   MRN: [id number]  Patient presents for Follow-up (Is fasting/)  Patient here to follow-up chronic medical problems.  Medications reviewed.  She is s/p radiation treatment for ductal carcinoma of the breast, she was recently started on arimidex   Chronic back pain with radiation- that has improved, if she moves a certain way will have pain in the back of the leg , but if she gets up or moves around it helps some    DM- last A1C 5.9% she is taking her medicines as prescribed and regular. NO SE with metformin   Hypothyroidism seen by Dr. Dorris Fetch, no chane in dose, TFT at goal now.  States that she is consistent with all of her medications.  HTN with chronic edema- she is taking diuretics most days, she has not been on aldactone, bottles reviewed, she is taking norvasc, hydralazine clonidine, losartan, lasix   OSA she is using CPAP some time, admits has not used as regulary the past few weeks   She still has some anxiety and some days where she feels depressed.  She is getting help from her family but there are some stressors within the household.  At this time she does feel like she in general is doing well with everything is happened the past few months.  Review Of Systems:  GEN- denies fatigue, fever, weight loss,weakness, recent illness HEENT- denies eye drainage, change in vision, nasal discharge, CVS- denies chest pain, palpitations RESP- denies SOB, cough, wheeze ABD- denies N/V, change in stools, abd pain GU- denies dysuria, hematuria, dribbling, incontinence MSK- +joint pain, muscle aches, injury Neuro- denies headache, dizziness, syncope, seizure activity       Objective:    BP 130/62   Pulse 80   Temp 98.3 F (36.8 C) (Temporal)   Resp 14   Ht 5\' 3"  (1.6 m)   Wt 256 lb 9.6 oz (116.4 kg)   SpO2 96%   BMI 45.45 kg/m  GEN- NAD, alert and oriented x3 HEENT- PERRL, EOMI, non injected  sclera, pink conjunctiva, MMM, oropharynx clear Neck- Supple, no thyromegaly , no JVD  CVS- RRR, 3/6 SEM  RESP-CTAB ABD-NABS,soft,NT,ND EXT- chronic non pitting edema Pulses- Radial, DP- 2+        Assessment & Plan:      Problem List Items Addressed This Visit      Unprioritized   Depression    She is not on any meds Has support from family      Diabetes mellitus, type II ([place])    Has been well controlled, goal  , 7% She has not been taking the statin drug Recheck lipids       Relevant Medications   losartan (COZAAR) 100 MG tablet   metFORMIN (GLUCOPHAGE) 500 MG tablet   Other Relevant Orders   Comprehensive metabolic panel   Hemoglobin A1c   Essential hypertension, benign - Primary    Controlled no changes  Reviewed meds, she has not been on aldactone but has been taking norvasc List corrected No further changes       Relevant Medications   amLODipine (NORVASC) 10 MG tablet   losartan (COZAAR) 100 MG tablet   furosemide (LASIX) 20 MG tablet   Other Relevant Orders   CBC with Differential/Platelet   Lipid panel   Hypothyroidism    Per endocrinology      Obesity   Relevant Medications   metFORMIN (  GLUCOPHAGE) 500 MG tablet   OSA (obstructive sleep apnea)    Discussed need for regular use of CPAP to get most benefit from treatment          Note: This dictation was prepared with Dragon dictation along with smaller phrase technology. Any transcriptional errors that result from this process are unintentional.

## 2020-06-13 LAB — CBC WITH DIFFERENTIAL/PLATELET
Absolute Monocytes: 383 cells/uL (ref 200–950)
Basophils Absolute: 33 cells/uL (ref 0–200)
Basophils Relative: 0.5 %
Eosinophils Absolute: 172 cells/uL (ref 15–500)
Eosinophils Relative: 2.6 %
HCT: 37.8 % (ref 35.0–45.0)
Hemoglobin: 12.1 g/dL (ref 11.7–15.5)
Lymphs Abs: 1492 cells/uL (ref 850–3900)
MCH: 26.3 pg — ABNORMAL LOW (ref 27.0–33.0)
MCHC: 32 g/dL (ref 32.0–36.0)
MCV: 82.2 fL (ref 80.0–100.0)
MPV: 10.6 fL (ref 7.5–12.5)
Monocytes Relative: 5.8 %
Neutro Abs: 4521 cells/uL (ref 1500–7800)
Neutrophils Relative %: 68.5 %
Platelets: 254 10*3/uL (ref 140–400)
RBC: 4.6 10*6/uL (ref 3.80–5.10)
RDW: 13.1 % (ref 11.0–15.0)
Total Lymphocyte: 22.6 %
WBC: 6.6 10*3/uL (ref 3.8–10.8)

## 2020-06-13 LAB — LIPID PANEL
Cholesterol: 180 mg/dL (ref <200)
HDL: 56 mg/dL (ref >=50)
LDL Cholesterol (Calc): 107 mg/dL (calc) — ABNORMAL HIGH
Non-HDL Cholesterol (Calc): 124 mg/dL (calc) (ref <130)
Total CHOL/HDL Ratio: 3.2 (calc) (ref <5.0)
Triglycerides: 77 mg/dL (ref <150)

## 2020-06-13 LAB — COMPREHENSIVE METABOLIC PANEL
AG Ratio: 1.2 (calc) (ref 1.0–2.5)
ALT: 8 U/L (ref 6–29)
AST: 11 U/L (ref 10–35)
Albumin: 3.7 g/dL (ref 3.6–5.1)
Alkaline phosphatase (APISO): 85 U/L (ref 37–153)
BUN: 16 mg/dL (ref 7–25)
CO2: 32 mmol/L (ref 20–32)
Calcium: 9.1 mg/dL (ref 8.6–10.4)
Chloride: 103 mmol/L (ref 98–110)
Creat: 0.68 mg/dL (ref 0.50–0.99)
Globulin: 3.1 g/dL (calc) (ref 1.9–3.7)
Glucose, Bld: 100 mg/dL — ABNORMAL HIGH (ref 65–99)
Potassium: 3.8 mmol/L (ref 3.5–5.3)
Sodium: 143 mmol/L (ref 135–146)
Total Bilirubin: 0.6 mg/dL (ref 0.2–1.2)
Total Protein: 6.8 g/dL (ref 6.1–8.1)

## 2020-06-13 LAB — HEMOGLOBIN A1C
Hgb A1c MFr Bld: 5.7 % of total Hgb — ABNORMAL HIGH (ref <5.7)
Mean Plasma Glucose: 117 mg/dL
eAG (mmol/L): 6.5 mmol/L

## 2020-06-16 NOTE — Progress Notes (Signed)
  Radiation Oncology         [phone] ________________________________  Name: Frances Hughes MRN: [id number]  Date: 04/27/2020  DOB: Dec 19, 1955  End of Treatment Note  Diagnosis:   left-sided breast cancer     Indication for treatment:  Curative       Radiation treatment dates:   03/30/20 - 04/27/20  Site/dose:   The patient initially received a dose of 42.56 Gy in 16 fractions to the breast using whole-breast tangent fields. This was delivered using a 3-D conformal technique. The patient then received a boost to the seroma. This delivered an additional 10 Gy in 4 fractions using a 4-field photon technique due to the depth of the seroma. The total dose was 52.56 Gy.   Narrative: The patient tolerated radiation treatment relatively well.   The patient had some expected skin irritation as she progressed during treatment.    Plan: The patient has completed radiation treatment. The patient will return to radiation oncology clinic for routine followup in one month. I advised the patient to call or return sooner if they have any questions or concerns related to their recovery or treatment. ________________________________  Jodelle Gross, M.D., Ph.D.

## 2020-07-01 DIAGNOSIS — G4733 Obstructive sleep apnea (adult) (pediatric): Secondary | ICD-10-CM | POA: Diagnosis not present

## 2020-07-13 ENCOUNTER — Other Ambulatory Visit (HOSPITAL_COMMUNITY)
Admission: RE | Admit: 2020-07-13 | Discharge: 2020-07-13 | Disposition: A | Discharge status: Home / Self Care | Payer: Medicare Other | Source: Ambulatory Visit | Attending: "Endocrinology | Admitting: "Endocrinology

## 2020-07-13 ENCOUNTER — Other Ambulatory Visit: Payer: Self-pay

## 2020-07-13 DIAGNOSIS — D352 Benign neoplasm of pituitary gland: Secondary | ICD-10-CM | POA: Diagnosis not present

## 2020-07-13 LAB — TSH: TSH: 0.066 u[IU]/mL — ABNORMAL LOW (ref 0.350–4.500)

## 2020-07-13 LAB — COMPREHENSIVE METABOLIC PANEL
ALT: 12 U/L (ref 0–44)
AST: 17 U/L (ref 15–41)
Albumin: 3.4 g/dL — ABNORMAL LOW (ref 3.5–5.0)
Alkaline Phosphatase: 74 U/L (ref 38–126)
Anion gap: 10 (ref 5–15)
BUN: 22 mg/dL (ref 8–23)
CO2: 28 mmol/L (ref 22–32)
Calcium: 9.1 mg/dL (ref 8.9–10.3)
Chloride: 103 mmol/L (ref 98–111)
Creatinine, Ser: 0.64 mg/dL (ref 0.44–1.00)
GFR, Estimated: >60 mL/min (ref >60)
Glucose, Bld: 92 mg/dL (ref 70–99)
Potassium: 3.4 mmol/L — ABNORMAL LOW (ref 3.5–5.1)
Sodium: 141 mmol/L (ref 135–145)
Total Bilirubin: 0.5 mg/dL (ref 0.3–1.2)
Total Protein: 7.5 g/dL (ref 6.5–8.1)

## 2020-07-13 LAB — T4, FREE: Free T4: 1.67 ng/dL — ABNORMAL HIGH (ref 0.61–1.12)

## 2020-07-16 ENCOUNTER — Other Ambulatory Visit: Payer: Self-pay

## 2020-07-16 ENCOUNTER — Ambulatory Visit: Payer: Medicare Other | Admitting: "Endocrinology

## 2020-07-16 ENCOUNTER — Encounter: Payer: Self-pay | Admitting: "Endocrinology

## 2020-07-16 VITALS — BP 138/78 | HR 72 | Ht 63.0 in | Wt 251.6 lb

## 2020-07-16 DIAGNOSIS — D3502 Benign neoplasm of left adrenal gland: Secondary | ICD-10-CM

## 2020-07-16 DIAGNOSIS — I10 Essential (primary) hypertension: Secondary | ICD-10-CM

## 2020-07-16 DIAGNOSIS — E890 Postprocedural hypothyroidism: Secondary | ICD-10-CM | POA: Diagnosis not present

## 2020-07-16 MED ORDER — LEVOTHYROXINE SODIUM 175 MCG PO TABS: 175.0000 ug | ORAL_TABLET | Freq: Every day | ORAL | 1 refills | Status: DC

## 2020-07-16 NOTE — Progress Notes (Signed)
07/16/2020, 10:50 AM  Endocrinology follow-up note   Subjective:    [name], female    DOB: 06/05/1955, PCP Frances Rossetti, MD   Past Medical History:  Diagnosis Date  . Adrenal mass, left ([place])   . Degenerative disc disease at L5-S1 level   . DM (diabetes mellitus) ([place])    Type II controlled  . Enlarged heart   . Family history of breast cancer   . Family history of colon cancer   . Family history of pancreatic cancer   . Family history of stomach cancer   . Grave's disease   . HTN (hypertension)   . Hyperlipidemia   . Lymphocytosis   . Nephrolithiasis   . OSA on CPAP    Past Surgical History:  Procedure Laterality Date  . ABDOMINAL HYSTERECTOMY    . BREAST BIOPSY Left 01/06/2020  . BREAST LUMPECTOMY WITH RADIOACTIVE SEED LOCALIZATION Left 02/19/2020   Procedure: LEFT BREAST LUMPECTOMY WITH RADIOACTIVE SEED LOCALIZATION;  Surgeon: [name], MD;  Location: [facility];  Service: General;  Laterality: Left;  . COLONOSCOPY  2011   normal colonoscopy  . COLONOSCOPY WITH PROPOFOL N/A 04/16/2018   Procedure: COLONOSCOPY WITH PROPOFOL;  Surgeon: Daneil Dolin, MD;  Location: [facility];  Service: Endoscopy;  Laterality: N/A;  9:45am  . HERNIA REPAIR  2011  . LITHOTRIPSY    . POLYPECTOMY  04/16/2018   Procedure: POLYPECTOMY;  Surgeon: Daneil Dolin, MD;  Location: [facility];  Service: Endoscopy;;  (colon)  . S/P Hysterectomy  10/1996   with bilat SOO seconardt to fibroids  . thyroid ablation  1986   Social History   Socioeconomic History  . Marital status: Married    Spouse name: Not on file  . Number of children: Not on file  . Years of education: Not on file  . Highest education level: Not on file  Occupational History  . Not on file  Tobacco Use  . Smoking status: Former Smoker    Packs/day: 0.75    Years: 10.00    Pack years: 7.50    Types:  Cigarettes    Start date: 01/30/1969    Quit date: 05/23/1988    Years since quitting: 32.1  . Smokeless tobacco: Never Used  Vaping Use  . Vaping Use: Never used  Substance and Sexual Activity  . Alcohol use: No    Alcohol/week: 0.0 standard drinks  . Drug use: No  . Sexual activity: Yes  Other Topics Concern  . Not on file  Social History Narrative  . Not on file   Social Determinants of Health   Financial Resource Strain: Not on file  Food Insecurity: Not on file  Transportation Needs: Not on file  Physical Activity: Not on file  Stress: Not on file  Social Connections: Not on file   Family History  Problem Relation Age of Onset  . Colon cancer Mother        d. 34  . Cancer Mother        metastisis sarcoma  . Colon cancer Sister 70  . Head & neck cancer Sister        roof of mouth  .  Colon polyps Sister   . Diabetes Father   . Hypertension Father   . Cancer Father   . Lung cancer Father        d. 32  . Colon polyps Sister   . Esophageal cancer Brother   . Cancer Brother        cancer of eye  . Aneurysm Maternal Aunt        brain  . Cancer Maternal Uncle        NOS  . COPD Maternal Uncle   . Stomach cancer Maternal Grandfather 42  . Heart attack Maternal Grandfather   . Pancreatic cancer Paternal Grandmother        d. 68  . Cancer Maternal Uncle        NOS - mother's pat 1/2 brother  . Breast cancer Cousin        3 maternal cousins with breast cancer, some under 54, all different branches  . Colon cancer Cousin        five mat first cousins, 4 were siblings  . Cancer Cousin        2 mat 1/2 cousins with NOS cancer   Outpatient Encounter Medications as of 07/16/2020  Medication Sig  . acetaminophen (TYLENOL) 650 MG CR tablet Take 1,300 mg by mouth 2 (two) times daily as needed for pain.  Marland Kitchen anastrozole (ARIMIDEX) 1 MG tablet Take 1 tablet (1 mg total) by mouth daily. Start 06/08/2020  . Blood Glucose Monitoring Suppl (BLOOD GLUCOSE MONITOR SYSTEM)  W/DEVICE KIT Please dispense based on patient and insurance preference. Use to monitor fasting blood sugar 1x daily. Dx: E11.9  . cloNIDine (CATAPRES) 0.2 MG tablet TAKE 1 TABLET(0.2 MG) BY MOUTH TWICE DAILY  . furosemide (LASIX) 20 MG tablet Take 1 tablet (20 mg total) by mouth daily as needed.  . Glucose Blood (BLOOD GLUCOSE TEST STRIPS) STRP Please dispense based on patient and insurance preference. Use to monitor fasting blood sugar 1x daily. Dx: E11.9  . hydrALAZINE (APRESOLINE) 50 MG tablet Take 1 tablet (50 mg total) by mouth 3 (three) times daily.  . Lancets 28G MISC Please dispense based on patient and insurance preference. Use to monitor fasting blood sugar 1x daily. Dx: E11.9  . levothyroxine (SYNTHROID) 175 MCG tablet Take 1 tablet (175 mcg total) by mouth daily before breakfast.  . losartan (COZAAR) 100 MG tablet Take 1 tablet (100 mg total) by mouth daily.  . metFORMIN (GLUCOPHAGE) 500 MG tablet TAKE 1 TABLET BY MOUTH TWICE DAILY WITH A MEAL  . potassium chloride SA (KLOR-CON) 20 MEQ tablet Take 1 tablet (20 mEq total) by mouth daily.  . [DISCONTINUED] amLODipine (NORVASC) 10 MG tablet Take 1 tablet (10 mg total) by mouth daily.  . [DISCONTINUED] levothyroxine (SYNTHROID) 200 MCG tablet TAKE 1 TABLET BY MOUTH EVERY DAY BEFORE BREAKFAST   No facility-administered encounter medications on file as of 07/16/2020.   ALLERGIES: No Known Allergies  VACCINATION STATUS: Immunization History  Administered Date(s) Administered  . Fluad Quad(high Dose 65+) 02/08/2019, 03/05/2020  . Influenza-Unspecified 03/21/2011, 03/20/2015  . Moderna Sars-Covid-2 Vaccination 07/08/2019, 07/31/2019  . Pneumococcal Polysaccharide-23 04/13/2007, 03/05/2020  . Tdap 09/14/2012    HPI Frances Hughes is 65 y.o. female who returns for follow-up with previsit labs.  She was initially seen in consultation for  left adrenal adenoma requested by Frances Rossetti, MD. The left adrenal adenoma was found to be  stable and nonfunctioning.     She has a long and complicated medical  history.  History is obtained directly from the patient and review of her medical records.  she has history of hypertension on multiple medications.  She is currently doing better on losartan, clonidine, and hydralazine.  Is also on amlodipine 10 mg daily.    She also has history of Graves' disease which required radioactive iodine thyroid ablation in 2006.  She is currently on levothyroxine 200 mcg p.o. daily.  She reports compliance to this medication.  Her previsit labs are consistent with over replacement.   She is known to have left adrenal mass at least since September 2005.  She has a series of abdominal and thoracic imaging studies since then and it was documented that this lesion was stable and displays features consistent with benign adenoma.  During her last abdominal CT in 2016 it measured 3.5 cm (3.2 cm in 2007) and the lesion demonstrated washout characteristics consistent with benign adrenal adenoma with absolute washout of 93%.  The right adrenal glands were normal. Over the years, she underwent various endocrine work-up for this adenoma and none of them were significant for functioning lesion. Most recently in October 2021 she underwent 24-hour urine collection for metanephrines which were normal. November 2021, she underwent plasma metanephrines which were also normal.   Along with her hypertension, patient has chronic hypokalemia which required intermittent supplement with potassium.  She is currently on potassium 20 mEq daily. She is not on regular diuresis with Lasix anymore.    She has well-controlled type 2 diabetes with recent A1c of 5.9%, currently on Metformin 500 mg p.o. twice daily.     She is recently diagnosed with breast cancer being prepared for chemo, surgery.  She was in the hospital in March 03, 2020, while on diuretics, spironolactone she underwent aldosterone, plasma renin activity  measurements.  Aldosterone was 8.2, plasma renin activity was suppressed at 0.167 which led to a low/PRA ratio  > 49.1.  A.m. cortisol on March 04, 2020 was 12.7-normal.  Patient describes chronic fatigue, exertional dyspnea, inability to lose weight/progressive weight gain.  She has sleep apnea on CPAP machine. She does not consume licorice, no family history of premature coronary artery disease, CVA, adrenal, thyroid, parathyroid, pituitary dysfunctions.  Review of Systems  Minimally fluctuating body weight.  Objective:    Vitals with BMI 07/16/2020 06/12/2020 05/04/2020  Height '5\' 3"'  '5\' 3"'  '5\' 3"'   Weight 251 lbs 10 oz 256 lbs 10 oz 257 lbs 14 oz  BMI 44.58 93.57 01.7  Systolic 793 903 009  Diastolic 78 62 79  Pulse 72 80 92    BP 138/78   Pulse 72   Ht '5\' 3"'  (1.6 m)   Wt 251 lb 9.6 oz (114.1 kg)   BMI 44.57 kg/m   Wt Readings from Last 3 Encounters:  07/16/20 251 lb 9.6 oz (114.1 kg)  06/12/20 256 lb 9.6 oz (116.4 kg)  05/04/20 257 lb 14.4 oz (117 kg)    Physical Exam She has bilateral peripheral edema.  CMP ( most recent) CMP     Component Value Date/Time   NA 141 07/13/2020 1330   K 3.4 (L) 07/13/2020 1330   CL 103 07/13/2020 1330   CO2 28 07/13/2020 1330   GLUCOSE 92 07/13/2020 1330   BUN 22 07/13/2020 1330   CREATININE 0.64 07/13/2020 1330   CREATININE 0.68 06/12/2020 0930   CALCIUM 9.1 07/13/2020 1330   PROT 7.5 07/13/2020 1330   ALBUMIN 3.4 (L) 07/13/2020 1330   AST 17 07/13/2020 1330  AST 17 03/02/2020 1526   ALT 12 07/13/2020 1330   ALT 13 03/02/2020 1526   ALKPHOS 74 07/13/2020 1330   BILITOT 0.5 07/13/2020 1330   BILITOT 0.3 03/02/2020 1526   GFRNONAA >60 07/13/2020 1330   GFRNONAA >60 03/02/2020 1526   GFRNONAA 94 11/07/2018 0927   GFRAA >60 02/17/2020 0930   GFRAA 109 11/07/2018 0927     Diabetic Labs (most recent): Lab Results  Component Value Date   HGBA1C 5.7 (H) 06/12/2020   HGBA1C 5.9 (H) 03/03/2020   HGBA1C 6.0 (H) 12/18/2019      Lipid Panel ( most recent) Lipid Panel     Component Value Date/Time   CHOL 180 06/12/2020 0930   TRIG 77 06/12/2020 0930   HDL 56 06/12/2020 0930   CHOLHDL 3.2 06/12/2020 0930   VLDL 21 09/09/2016 1311   LDLCALC 107 (H) 06/12/2020 0930   LDLDIRECT 110 (H) 01/21/2012 0950   Results for DESREE, LEAP (MRN [id number]) as of 07/16/2020 10:28  Ref. Range 10/30/2006 21:40 03/03/2020 16:36 03/04/2020 02:49 04/14/2020 09:32  ALDOSTERONE Latest Ref Range: 0.0 - 30.0 ng/dL  8.2    PRA LC/MS/MS Latest Ref Range: 0.167 - 5.380 ng/mL/hr  <0.167 (L)    ALDO / PRA Ratio Latest Ref Range: 0.0 - 30.0   >49.1 (H)    Cortisol, Plasma Latest Units: mcg/dL 6.1     Cortisol - AM Latest Ref Range: 6.7 - 22.6 ug/dL   12.7   Metanephrine, Pl Latest Ref Range: 0.0 - 88.0 pg/mL    38.5  Normetanephrine, Pl Latest Ref Range: 0.0 - 285.2 pg/mL    155.2    Assessment & Plan:   1. Adrenal adenoma- left -Her plasma free metanephrines were normal.  Review of all of her previous work-up points to a stable nonfunctioning left adrenal adenoma.  This adenoma is not contributing to her history of hypertension.    This lesion was documented to be stable since 2005, will not need surgical intervention at this time.  Her hypokalemia seems to be diuretic related, and responding to low-dose potassium supplement. She does not have paroxysmal symptoms to suggest pheochromocytoma.  Her recent work-up rules out  Cushing syndrome. Her recent work-up with aldosterone, plasma renin activity are not conclusive for primary hyperaldosteronism.  She will not need surgical treatment for the adenoma.    She is responding to her current regimen of blood pressure medications.  She is advised to continue clonidine 0.2 mg p.o. twice daily, hydralazine 50 mg p.o. 3 times daily, losartan 100 mg p.o. daily.  She is advised to discontinue amlodipine.    2.  RAI induced hypothyroidism  Regarding her RAI induced hypothyroidism: Her  previsit thyroid function tests are consistent with over replacement.  I discussed and lowered her levothyroxine to 175 mcg p.o. daily before breakfast.    - We discussed about the correct intake of her thyroid hormone, on empty stomach at fasting, with water, separated by at least 30 minutes from breakfast and other medications,  and separated by more than 4 hours from calcium, iron, multivitamins, acid reflux medications (PPIs). -Patient is made aware of the fact that thyroid hormone replacement is needed for life, dose to be adjusted by periodic monitoring of thyroid function tests.   - she is advised to maintain close follow up with Frances Rossetti, MD for primary care needs.     - Time spent on this patient care encounter:  30 minutes of which 50% was spent  in  counseling and the rest reviewing  her current and  previous labs / studies and medications  doses and developing a plan for long term care, and documenting this care. Frances Hughes  participated in the discussions, expressed understanding, and voiced agreement with the above plans.  All questions were answered to her satisfaction. she is encouraged to contact clinic should she have any questions or concerns prior to her return visit.   Follow up plan: Return in about 6 months (around 01/13/2021) for F/U with Pre-visit Labs.   Glade Lloyd, MD [facility] [address] [place], [place] [address] Phone: [phone]  Fax: [phone]     07/16/2020, 10:50 AM  This note was partially dictated with voice recognition software. Similar sounding words can be transcribed inadequately or may not  be corrected upon review.

## 2020-07-16 NOTE — Patient Instructions (Signed)

## 2020-08-25 ENCOUNTER — Other Ambulatory Visit: Payer: Self-pay | Admitting: Family Medicine

## 2020-08-25 ENCOUNTER — Other Ambulatory Visit: Payer: Self-pay | Admitting: "Endocrinology

## 2020-08-28 NOTE — Progress Notes (Signed)
Subjective:   Frances Hughes is a 65 y.o. female who presents for an Initial Medicare Annual Wellness Visit.  Review of Systems    N/A  Cardiac Risk Factors include: advanced age (>59mn, >>30women);hypertension;dyslipidemia;diabetes mellitus     Objective:    Today's Vitals   08/31/20 1108 08/31/20 1109  BP: 132/84   Pulse: 85   Temp: 98.4 F (36.9 C)   TempSrc: Oral   Weight: 243 lb (110.2 kg)   Height: '5\' 3"'  (1.6 m)   PainSc:  5    Body mass index is 43.05 kg/m.  Advanced Directives 08/31/2020 03/19/2020 03/03/2020 03/02/2020 02/19/2020 02/14/2020 02/06/2020  Does Patient Have a Medical Advance Directive? Yes Yes - Yes Yes Yes Yes  Type of Advance Directive [place] Living will - [place] will [place] - [place]  Does patient want to make changes to medical advance directive? No - Patient declined No - Patient declined No - Patient declined - No - Patient declined No - Patient declined No - Patient declined  Copy of [place] Chart? No - copy requested No - copy requested - No - copy requested No - copy requested No - copy requested No - copy requested    Current Medications (verified) Outpatient Encounter Medications as of 08/31/2020  Medication Sig  . anastrozole (ARIMIDEX) 1 MG tablet Take 1 tablet (1 mg total) by mouth daily. Start 06/08/2020  . atorvastatin (LIPITOR) 40 MG tablet TAKE 1 TABLET(40 MG) BY MOUTH DAILY  . Blood Glucose Monitoring Suppl (BLOOD GLUCOSE MONITOR SYSTEM) W/DEVICE KIT Please dispense based on patient and insurance preference. Use to monitor fasting blood sugar 1x daily. Dx: E11.9  . cloNIDine (CATAPRES) 0.2 MG tablet TAKE 1 TABLET(0.2 MG) BY MOUTH TWICE DAILY  . cloNIDine (CATAPRES) 0.2 MG tablet TAKE 1 TABLET(0.2 MG) BY MOUTH TWICE DAILY  . furosemide (LASIX) 20 MG tablet Take 1 tablet (20 mg total)  by mouth daily as needed.  . Glucose Blood (BLOOD GLUCOSE TEST STRIPS) STRP Please dispense based on patient and insurance preference. Use to monitor fasting blood sugar 1x daily. Dx: E11.9  . hydrALAZINE (APRESOLINE) 50 MG tablet Take 1 tablet (50 mg total) by mouth 3 (three) times daily.  . Lancets 28G MISC Please dispense based on patient and insurance preference. Use to monitor fasting blood sugar 1x daily. Dx: E11.9  . levothyroxine (SYNTHROID) 175 MCG tablet Take 1 tablet (175 mcg total) by mouth daily before breakfast.  . losartan (COZAAR) 100 MG tablet TAKE 1 TABLET(100 MG) BY MOUTH DAILY  . metFORMIN (GLUCOPHAGE) 500 MG tablet TAKE 1 TABLET BY MOUTH TWICE DAILY WITH A MEAL  . potassium chloride SA (KLOR-CON) 20 MEQ tablet Take 1 tablet (20 mEq total) by mouth daily.  .Marland Kitchenacetaminophen (TYLENOL) 650 MG CR tablet Take 1,300 mg by mouth 2 (two) times daily as needed for pain. (Patient not taking: Reported on 08/31/2020)  . levothyroxine (SYNTHROID) 200 MCG tablet TAKE 1 TABLET BY MOUTH EVERY DAY BEFORE BREAKFAST (Patient not taking: Reported on 08/31/2020)  . spironolactone (ALDACTONE) 25 MG tablet TAKE 1/2 TABLET(12.5 MG) BY MOUTH DAILY (Patient not taking: Reported on 08/31/2020)   No facility-administered encounter medications on file as of 08/31/2020.    Allergies (verified) Patient has no known allergies.   History: Past Medical History:  Diagnosis Date  . Adrenal mass, left ([place]   . Degenerative disc disease at  L5-S1 level   . DM (diabetes mellitus) ([place])    Type II controlled  . Enlarged heart   . Family history of breast cancer   . Family history of colon cancer   . Family history of pancreatic cancer   . Family history of stomach cancer   . Grave's disease   . HTN (hypertension)   . Hyperlipidemia   . Lymphocytosis   . Nephrolithiasis   . OSA on CPAP    Past Surgical History:  Procedure Laterality Date  . ABDOMINAL HYSTERECTOMY    . BREAST BIOPSY Left 01/06/2020  .  BREAST LUMPECTOMY WITH RADIOACTIVE SEED LOCALIZATION Left 02/19/2020   Procedure: LEFT BREAST LUMPECTOMY WITH RADIOACTIVE SEED LOCALIZATION;  Surgeon: [name], MD;  Location: [facility];  Service: General;  Laterality: Left;  . COLONOSCOPY  2011   normal colonoscopy  . COLONOSCOPY WITH PROPOFOL N/A 04/16/2018   Procedure: COLONOSCOPY WITH PROPOFOL;  Surgeon: Daneil Dolin, MD;  [facility];  Service: Endoscopy;  Laterality: N/A;  9:45am  . HERNIA REPAIR  2011  . LITHOTRIPSY    . POLYPECTOMY  04/16/2018   Procedure: POLYPECTOMY;  Surgeon: Daneil Dolin, MD;  [facility];  Service: Endoscopy;;  (colon)  . S/P Hysterectomy  10/1996   with bilat SOO seconardt to fibroids  . thyroid ablation  1986   Family History  Problem Relation Age of Onset  . Colon cancer Mother        d. 26  . Cancer Mother        metastisis sarcoma  . Colon cancer Sister 12  . Head & neck cancer Sister        roof of mouth  . Colon polyps Sister   . Diabetes Father   . Hypertension Father   . Cancer Father   . Lung cancer Father        d. 58  . Colon polyps Sister   . Esophageal cancer Brother   . Cancer Brother        cancer of eye  . Aneurysm Maternal Aunt        brain  . Cancer Maternal Uncle        NOS  . COPD Maternal Uncle   . Stomach cancer Maternal Grandfather 61  . Heart attack Maternal Grandfather   . Pancreatic cancer Paternal Grandmother        d. 54  . Cancer Maternal Uncle        NOS - mother's pat 1/2 brother  . Breast cancer Cousin        3 maternal cousins with breast cancer, some under 56, all different branches  . Colon cancer Cousin        five mat first cousins, 4 were siblings  . Cancer Cousin        2 mat 1/2 cousins with NOS cancer   Social History   Socioeconomic History  . Marital status: Married    Spouse name: Not on file  . Number of children: Not on file  . Years of education: Not on file  . Highest education  level: Not on file  Occupational History  . Not on file  Tobacco Use  . Smoking status: Former Smoker    Packs/day: 0.75    Years: 10.00    Pack years: 7.50    Types: Cigarettes    Start date: 01/30/1969    Quit date: 05/23/1988    Years since quitting: 32.2  .  Smokeless tobacco: Never Used  Vaping Use  . Vaping Use: Never used  Substance and Sexual Activity  . Alcohol use: No    Alcohol/week: 0.0 standard drinks  . Drug use: No  . Sexual activity: Yes  Other Topics Concern  . Not on file  Social History Narrative  . Not on file   Social Determinants of Health   Financial Resource Strain: Low Risk   . Difficulty of Paying Living Expenses: Not hard at all  Food Insecurity: No Food Insecurity  . Worried About Charity fundraiser in the Last Year: Never true  . Ran Out of Food in the Last Year: Never true  Transportation Needs: No Transportation Needs  . Lack of Transportation (Medical): No  . Lack of Transportation (Non-Medical): No  Physical Activity: Inactive  . Days of Exercise per Week: 0 days  . Minutes of Exercise per Session: 0 min  Stress: No Stress Concern Present  . Feeling of Stress : Not at all  Social Connections: Moderately Integrated  . Frequency of Communication with Friends and Family: More than three times a week  . Frequency of Social Gatherings with Friends and Family: More than three times a week  . Attends Religious Services: More than 4 times per year  . Active Member of Clubs or Organizations: No  . Attends Archivist Meetings: Never  . Marital Status: Married    Tobacco Counseling Counseling given: Not Answered   Clinical Intake:  Pre-visit preparation completed: Yes  Pain : 0-10 Pain Score: 5  Pain Type: Chronic pain Pain Location: Leg Pain Orientation: Left Pain Descriptors / Indicators: Aching Pain Onset: More than a month ago Pain Frequency: Intermittent     Nutritional Risks: Unintentional weight loss Diabetes:  Yes CBG done?: No Did pt. bring in CBG monitor from home?: No  How often do you need to have someone help you when you read instructions, pamphlets, or other written materials from your doctor or pharmacy?: 1 - Never What is the last grade level you completed in school?: College  Diabetic?Yes Nutrition Risk Assessment:  Has the patient had any N/V/D within the last 2 months?  No  Does the patient have any non-healing wounds?  No  Has the patient had any unintentional weight loss or weight gain?  Yes   Diabetes:  Is the patient diabetic?  Yes  If diabetic, was a CBG obtained today?  No  Did the patient bring in their glucometer from home?  No  How often do you monitor your CBG's? Checks glucose only when symptomatic.   Financial Strains and Diabetes Management:  Are you having any financial strains with the device, your supplies or your medication? No .  Does the patient want to be seen by Chronic Care Management for management of their diabetes?  No  Would the patient like to be referred to a Nutritionist or for Diabetic Management?  No   Diabetic Exams:  Diabetic Eye Exam: Overdue for diabetic eye exam. Pt has been advised about the importance in completing this exam. Patient advised to call and schedule an eye exam. Diabetic Foot Exam: Completed 09/09/2019   Interpreter Needed?: No  Information entered by :: [place] of Daily Living In your present state of health, do you have any difficulty performing the following activities: 08/31/2020 03/03/2020  Hearing? N N  Vision? N N  Difficulty concentrating or making decisions? N N  Walking or climbing stairs? N N  Dressing  or bathing? N N  Doing errands, shopping? N Y  Conservation officer, nature and eating ? N -  Using the Toilet? N -  In the past six months, have you accidently leaked urine? Y -  Comment has occassional bladder leakage -  Do you have problems with loss of bowel control? N -  Managing your  Medications? N -  Managing your Finances? N -  Housekeeping or managing your Housekeeping? N -  Some recent data might be hidden    Patient Care Team: Branch, Alphonse Guild, MD as PCP - Cardiology (Cardiology) Gala Romney Cristopher Estimable, MD as Consulting Physician (Gastroenterology) Mauro Kaufmann, RN as Oncology Nurse Navigator Rockwell Germany, RN as Oncology Nurse Navigator Magrinat, Virgie Dad, MD as Consulting Physician (Oncology) Kyung Rudd, MD as Consulting Physician (Radiation Oncology) [name], MD as Consulting Physician (General Surgery)  Indicate any recent Medical Services you may have received from other than [facility] providers in the past year (date may be approximate).     Assessment:   This is a routine wellness examination for [facility].  Hearing/Vision screen  Hearing Screening   '125Hz'  '250Hz'  '500Hz'  '1000Hz'  '2000Hz'  '3000Hz'  '4000Hz'  '6000Hz'  '8000Hz'   Right ear:           Left ear:           Vision Screening Comments: Has not had eye exam in over a year. Wears reading   Dietary issues and exercise activities discussed: Current Exercise Habits: The patient does not participate in regular exercise at present, Exercise limited by: None identified  Goals    . Patient Stated     I just want to be happy!      Depression Screen PHQ 2/9 Scores 08/31/2020 06/12/2020 04/20/2020 12/18/2019 09/09/2019 02/08/2019 11/07/2018  PHQ - 2 Score '2 3 2 2 2 ' 0 2  PHQ- 9 Score '6 14 4 8 8 ' - 6    Fall Risk Fall Risk  08/31/2020 06/12/2020 04/20/2020 12/18/2019 09/09/2019  Falls in the past year? 0 0 0 0 0  Number falls in past yr: 0 - - - -  Injury with Fall? 0 - - - -  Risk for fall due to : No Fall Risks No Fall Risks No Fall Risks No Fall Risks No Fall Risks  Follow up Falls evaluation completed;Falls prevention discussed Falls evaluation completed Falls evaluation completed Falls evaluation completed Falls evaluation completed    FALL RISK PREVENTION PERTAINING TO THE HOME:  Any stairs in or around the  home? Yes  If so, are there any without handrails? No  Home free of loose throw rugs in walkways, pet beds, electrical cords, etc? Yes  Adequate lighting in your home to reduce risk of falls? Yes   ASSISTIVE DEVICES UTILIZED TO PREVENT FALLS:  Life alert? No  Use of a cane, walker or w/c? No  Grab bars in the bathroom? No  Shower chair or bench in shower? No  Elevated toilet seat or a handicapped toilet? No   TIMED UP AND GO:  Was the test performed? Yes .  Length of time to ambulate 10 feet: 4 sec.   Gait steady and fast without use of assistive device  Cognitive Function:     Normal cognitive status assessed by direct observation by this Nurse Health Advisor. No abnormalities found.      Immunizations Immunization History  Administered Date(s) Administered  . Fluad Quad(high Dose 65+) 02/08/2019, 03/05/2020  . Influenza-Unspecified 03/21/2011, 03/20/2015  . Moderna Sars-Covid-2 Vaccination 07/08/2019, 07/31/2019  .  Pneumococcal Polysaccharide-23 04/13/2007, 03/05/2020  . Tdap 09/14/2012    TDAP status: Up to date  Flu Vaccine status: Up to date  Pneumococcal vaccine status: Up to date  Covid-19 vaccine status: Completed vaccines  Qualifies for Shingles Vaccine? Yes   Zostavax completed No   Shingrix Completed?: No.    Education has been provided regarding the importance of this vaccine. Patient has been advised to call insurance company to determine out of pocket expense if they have not yet received this vaccine. Advised may also receive vaccine at local pharmacy or Health Dept. Verbalized acceptance and understanding.  Screening Tests Health Maintenance  Topic Date Due  . DEXA SCAN  Never done  . COVID-19 Vaccine (3 - Inadvertent risk 4-dose series) 08/28/2019  . OPHTHALMOLOGY EXAM  06/24/2020  . FOOT EXAM  09/08/2020  . HEMOGLOBIN A1C  12/10/2020  . MAMMOGRAM  12/11/2020  . INFLUENZA VACCINE  12/21/2020  . PNA vac Low Risk Adult (2 of 2 - PCV13)  03/05/2021  . TETANUS/TDAP  09/15/2022  . COLONOSCOPY (Pts 45-54yr Insurance coverage will need to be confirmed)  04/17/2023  . Hepatitis C Screening  Completed  . HPV VACCINES  Aged Out    Health Maintenance  Health Maintenance Due  Topic Date Due  . DEXA SCAN  Never done  . COVID-19 Vaccine (3 - Inadvertent risk 4-dose series) 08/28/2019  . OPHTHALMOLOGY EXAM  06/24/2020    Colorectal cancer screening: Type of screening: Colonoscopy. Completed 04/16/2018. Repeat every 5 years  Mammogram status: Completed 12/12/2019. Repeat every year  Bone Density status: Ordered 08/31/2020. Pt provided with contact info and advised to call to schedule appt.  Lung Cancer Screening: (Low Dose CT Chest recommended if Age 67-80years, 30 pack-year currently smoking OR have quit w/in 15years.) does not qualify.   Lung Cancer Screening Referral: N/A   Additional Screening:  Hepatitis C Screening: does qualify; Completed 10/13/2017  Vision Screening: Recommended annual ophthalmology exams for early detection of glaucoma and other disorders of the eye. Is the patient up to date with their annual eye exam?  No  Who is the provider or what is the name of the office in which the patient attends annual eye exams? Dr. PChong SicilianIf pt is not established with a provider, would they like to be referred to a provider to establish care? No .   Dental Screening: Recommended annual dental exams for proper oral hygiene  Community Resource Referral / Chronic Care Management: CRR required this visit?  No   CCM required this visit?  No      Plan:     I have personally reviewed and noted the following in the patient's chart:   . Medical and social history . Use of alcohol, tobacco or illicit drugs  . Current medications and supplements . Functional ability and status . Nutritional status . Physical activity . Advanced directives . List of other physicians . Hospitalizations, surgeries, and ER visits  in previous 12 months . Vitals . Screenings to include cognitive, depression, and falls . Referrals and appointments  In addition, I have reviewed and discussed with patient certain preventive protocols, quality metrics, and best practice recommendations. A written personalized care plan for preventive services as well as general preventive health recommendations were provided to patient.     SOfilia Neas LPN   [id number]  Nurse Notes: None

## 2020-08-31 ENCOUNTER — Ambulatory Visit (INDEPENDENT_AMBULATORY_CARE_PROVIDER_SITE_OTHER): Payer: Medicare Other

## 2020-08-31 ENCOUNTER — Other Ambulatory Visit: Payer: Self-pay

## 2020-08-31 VITALS — BP 132/84 | HR 85 | Temp 98.4°F | Ht 63.0 in | Wt 243.0 lb

## 2020-08-31 DIAGNOSIS — Z780 Asymptomatic menopausal state: Secondary | ICD-10-CM | POA: Diagnosis not present

## 2020-08-31 DIAGNOSIS — Z0001 Encounter for general adult medical examination with abnormal findings: Secondary | ICD-10-CM

## 2020-08-31 DIAGNOSIS — Z0000 Encounter for general adult medical examination without abnormal findings: Secondary | ICD-10-CM

## 2020-08-31 NOTE — Patient Instructions (Signed)
Frances Hughes , Thank you for taking time to come for your Medicare Wellness Visit. I appreciate your ongoing commitment to your health goals. Please review the following plan we discussed and let me know if I can assist you in the future.   Screening recommendations/referrals: Colonoscopy: Up to date, next due 04/17/2023 Mammogram: Up to date, next due 01/05/2021 Bone Density: Currently due orders placed this visit  Recommended yearly ophthalmology/optometry visit for glaucoma screening and checkup Recommended yearly dental visit for hygiene and checkup  Vaccinations: Influenza vaccine: Up to date, next due fall 2022  Pneumococcal vaccine: Up to date, next due 03/05/2021 Tdap vaccine: Up to date, next due 09/15/2022 Shingles vaccine: Currently due for Shingrix, if you would like to receive we recommend that you do so at your local pharmacy as it is less expensive     Advanced directives: Please bring copies of your advanced medical directives so that we may scan them into your chart.  Conditions/risks identified: None   Next appointment: None    Preventive Care 65 Years and Older, Female Preventive care refers to lifestyle choices and visits with your health care provider that can promote health and wellness. What does preventive care include?  A yearly physical exam. This is also called an annual well check.  Dental exams once or twice a year.  Routine eye exams. Ask your health care provider how often you should have your eyes checked.  Personal lifestyle choices, including:  Daily care of your teeth and gums.  Regular physical activity.  Eating a healthy diet.  Avoiding tobacco and drug use.  Limiting alcohol use.  Practicing safe sex.  Taking low-dose aspirin every day.  Taking vitamin and mineral supplements as recommended by your health care provider. What happens during an annual well check? The services and screenings done by your health care provider  during your annual well check will depend on your age, overall health, lifestyle risk factors, and family history of disease. Counseling  Your health care provider may ask you questions about your:  Alcohol use.  Tobacco use.  Drug use.  Emotional well-being.  Home and relationship well-being.  Sexual activity.  Eating habits.  History of falls.  Memory and ability to understand (cognition).  Work and work Statistician.  Reproductive health. Screening  You may have the following tests or measurements:  Height, weight, and BMI.  Blood pressure.  Lipid and cholesterol levels. These may be checked every 5 years, or more frequently if you are over 24 years old.  Skin check.  Lung cancer screening. You may have this screening every year starting at age 55 if you have a 30-pack-year history of smoking and currently smoke or have quit within the past 15 years.  Fecal occult blood test (FOBT) of the stool. You may have this test every year starting at age 97.  Flexible sigmoidoscopy or colonoscopy. You may have a sigmoidoscopy every 5 years or a colonoscopy every 10 years starting at age 68.  Hepatitis C blood test.  Hepatitis B blood test.  Sexually transmitted disease (STD) testing.  Diabetes screening. This is done by checking your blood sugar (glucose) after you have not eaten for a while (fasting). You may have this done every 1-3 years.  Bone density scan. This is done to screen for osteoporosis. You may have this done starting at age 42.  Mammogram. This may be done every 1-2 years. Talk to your health care provider about how often you should have regular  mammograms. Talk with your health care provider about your test results, treatment options, and if necessary, the need for more tests. Vaccines  Your health care provider may recommend certain vaccines, such as:  Influenza vaccine. This is recommended every year.  Tetanus, diphtheria, and acellular pertussis  (Tdap, Td) vaccine. You may need a Td booster every 10 years.  Zoster vaccine. You may need this after age 15.  Pneumococcal 13-valent conjugate (PCV13) vaccine. One dose is recommended after age 12.  Pneumococcal polysaccharide (PPSV23) vaccine. One dose is recommended after age 72. Talk to your health care provider about which screenings and vaccines you need and how often you need them. This information is not intended to replace advice given to you by your health care provider. Make sure you discuss any questions you have with your health care provider. Document Released: 06/05/2015 Document Revised: 01/27/2016 Document Reviewed: 03/10/2015 Elsevier Interactive Patient Education  2017 [employer] Prevention in the Home Falls can cause injuries. They can happen to people of all ages. There are many things you can do to make your home safe and to help prevent falls. What can I do on the outside of my home?  Regularly fix the edges of walkways and driveways and fix any cracks.  Remove anything that might make you trip as you walk through a door, such as a raised step or threshold.  Trim any bushes or trees on the path to your home.  Use bright outdoor lighting.  Clear any walking paths of anything that might make someone trip, such as rocks or tools.  Regularly check to see if handrails are loose or broken. Make sure that both sides of any steps have handrails.  Any raised decks and porches should have guardrails on the edges.  Have any leaves, snow, or ice cleared regularly.  Use sand or salt on walking paths during winter.  Clean up any spills in your garage right away. This includes oil or grease spills. What can I do in the bathroom?  Use night lights.  Install grab bars by the toilet and in the tub and shower. Do not use towel bars as grab bars.  Use non-skid mats or decals in the tub or shower.  If you need to sit down in the shower, use a plastic, non-slip  stool.  Keep the floor dry. Clean up any water that spills on the floor as soon as it happens.  Remove soap buildup in the tub or shower regularly.  Attach bath mats securely with double-sided non-slip rug tape.  Do not have throw rugs and other things on the floor that can make you trip. What can I do in the bedroom?  Use night lights.  Make sure that you have a light by your bed that is easy to reach.  Do not use any sheets or blankets that are too big for your bed. They should not hang down onto the floor.  Have a firm chair that has side arms. You can use this for support while you get dressed.  Do not have throw rugs and other things on the floor that can make you trip. What can I do in the kitchen?  Clean up any spills right away.  Avoid walking on wet floors.  Keep items that you use a lot in easy-to-reach places.  If you need to reach something above you, use a strong step stool that has a grab bar.  Keep electrical cords out of the way.  Do not use floor polish or wax that makes floors slippery. If you must use wax, use non-skid floor wax.  Do not have throw rugs and other things on the floor that can make you trip. What can I do with my stairs?  Do not leave any items on the stairs.  Make sure that there are handrails on both sides of the stairs and use them. Fix handrails that are broken or loose. Make sure that handrails are as long as the stairways.  Check any carpeting to make sure that it is firmly attached to the stairs. Fix any carpet that is loose or worn.  Avoid having throw rugs at the top or bottom of the stairs. If you do have throw rugs, attach them to the floor with carpet tape.  Make sure that you have a light switch at the top of the stairs and the bottom of the stairs. If you do not have them, ask someone to add them for you. What else can I do to help prevent falls?  Wear shoes that:  Do not have high heels.  Have rubber bottoms.  Are  comfortable and fit you well.  Are closed at the toe. Do not wear sandals.  If you use a stepladder:  Make sure that it is fully opened. Do not climb a closed stepladder.  Make sure that both sides of the stepladder are locked into place.  Ask someone to hold it for you, if possible.  Clearly mark and make sure that you can see:  Any grab bars or handrails.  First and last steps.  Where the edge of each step is.  Use tools that help you move around (mobility aids) if they are needed. These include:  Canes.  Walkers.  Scooters.  Crutches.  Turn on the lights when you go into a dark area. Replace any light bulbs as soon as they burn out.  Set up your furniture so you have a clear path. Avoid moving your furniture around.  If any of your floors are uneven, fix them.  If there are any pets around you, be aware of where they are.  Review your medicines with your doctor. Some medicines can make you feel dizzy. This can increase your chance of falling. Ask your doctor what other things that you can do to help prevent falls. This information is not intended to replace advice given to you by your health care provider. Make sure you discuss any questions you have with your health care provider. Document Released: 03/05/2009 Document Revised: 10/15/2015 Document Reviewed: 06/13/2014 Elsevier Interactive Patient Education  2017 [employer].

## 2020-09-01 NOTE — Progress Notes (Signed)
[place]  Telephone:([phone]) [phone] Fax:(336) [phone]     ID: Frances Hughes DOB: [birth year]  MR#: [id number]  CVE#:[id number]  Patient Care Team: Pcp, No as PCP - General Frances Bowie Alphonse Guild, MD as PCP - Cardiology (Cardiology) Frances Romney Cristopher Estimable, MD as Consulting Physician (Gastroenterology) Frances Kaufmann, RN as Oncology Nurse Navigator Frances Germany, RN as Oncology Nurse Navigator Frances Hughes, Virgie Dad, MD as Consulting Physician (Oncology) Frances Rudd, MD as Consulting Physician (Radiation Oncology) Frances Keens, MD as Consulting Physician (General Surgery) Frances Cruel, MD OTHER MD:  CHIEF COMPLAINT: noninvasive breast cancer, estrogen receptor positive  CURRENT TREATMENT: anastrozole   INTERVAL HISTORY: Frances Hughes returns today for follow up of her noninvasive breast cancer.  She is accompanied by her husband  She started anastrozole on 06/08/2020.  She is not having increased problems with hot flashes or vaginal dryness.   REVIEW OF SYSTEMS: Frances Hughes has severe nocturia, up to 4 times a night.  This is not a new issue however.  She is not exercising at all she says.  She is thinking she might want to start a walking program.  She has decided at least for now not to get genetically tested.  A detailed review of systems today was otherwise stable   COVID 19 VACCINATION STATUS: fully vaccinated Frances Hughes), most recent dose March 2021   HISTORY OF CURRENT ILLNESS: From the original intake note:  Frances Hughes had routine screening mammography on 12/12/2019 showing a possible abnormality in the left breast. She underwent left diagnostic mammography with tomography at [facility] on 12/24/2019 showing: breast density category B; upper-outer left breast calcifications spanning 1.5 cm.  Accordingly on 01/06/2020 she proceeded to biopsy of the left breast area in question. The pathology from this procedure ([id number]) showed: ductal carcinoma in situ, low  grade, with calcifications. Prognostic indicators significant for: estrogen receptor, 100% positive with strong staining intensity and progesterone receptor, 60% positive with weak staining intensity.   She opted to proceed with lumpectomy on 02/19/2020 under Dr. Ninfa Linden. Pathology from the procedure [id number]) showed: ductal carcinoma in situ, low to intermediate grade, 1.5 cm, with calcifications and focal necrosis; lobular neoplasia; margins not involved.  The patient's subsequent history is as detailed below.   PAST MEDICAL HISTORY: Past Medical History:  Diagnosis Date  . Adrenal mass, left ([place])   . Degenerative disc disease at L5-S1 level   . 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 Enlarged heart   . Family history of breast cancer   . Family history of colon cancer   . Family history of pancreatic cancer   . Family history of stomach cancer   . Grave's disease   . HTN (hypertension)   . Hyperlipidemia   . Lymphocytosis   . Nephrolithiasis   . OSA on CPAP     PAST SURGICAL HISTORY: Past Surgical History:  Procedure Laterality Date  . ABDOMINAL HYSTERECTOMY    . BREAST BIOPSY Left 01/06/2020  . BREAST LUMPECTOMY WITH RADIOACTIVE SEED LOCALIZATION Left 02/19/2020   Procedure: LEFT BREAST LUMPECTOMY WITH RADIOACTIVE SEED LOCALIZATION;  Surgeon: Frances Keens, MD;  Location: [facility];  Service: General;  Laterality: Left;  . COLONOSCOPY  2011   normal colonoscopy  . COLONOSCOPY WITH PROPOFOL N/A 04/16/2018   Procedure: COLONOSCOPY WITH PROPOFOL;  Surgeon: Daneil Dolin, MD;  [facility];  Service: Endoscopy;  Laterality: N/A;  9:45am  . HERNIA REPAIR  2011  .  LITHOTRIPSY    . POLYPECTOMY  04/16/2018   Procedure: POLYPECTOMY;  Surgeon: Daneil Dolin, MD;  [facility];  Service: Endoscopy;;  (colon)  . S/P Hysterectomy  10/1996   with bilat SOO seconardt to fibroids  . thyroid ablation  1986    FAMILY  HISTORY: Family History  Problem Relation Age of Onset  . Colon cancer Mother        d. 68  . Cancer Mother        metastisis sarcoma  . Colon cancer Sister 21  . Head & neck cancer Sister        roof of mouth  . Colon polyps Sister   . Diabetes Father   . Hypertension Father   . Cancer Father   . Lung cancer Father        d. 43  . Colon polyps Sister   . Esophageal cancer Brother   . Cancer Brother        cancer of eye  . Aneurysm Maternal Aunt        brain  . Cancer Maternal Uncle        NOS  . COPD Maternal Uncle   . Stomach cancer Maternal Grandfather 15  . Heart attack Maternal Grandfather   . Pancreatic cancer Paternal Grandmother        d. 105  . Cancer Maternal Uncle        NOS - mother's pat 1/2 brother  . Breast cancer Cousin        3 maternal cousins with breast cancer, some under 61, all different branches  . Colon cancer Cousin        five mat first cousins, 4 were siblings  . Cancer Cousin        2 mat 1/2 cousins with NOS cancer  The patient's father died from lung cancer at the age of 29.  He worked in [place] and also smoked.  The patient's mother had "cancer all over her abdomen" and died from bowel perforation.  The patient tells me she has 4 cousins with colon cancer and a sister who died from colon cancer.  One brother died from esophageal cancer.   GYNECOLOGIC HISTORY:  No LMP recorded. Patient has had a hysterectomy. Menarche: 65 years old Age at first live birth: 65 years old GX P 4 HRT no  Hysterectomy? Yes, 10/1996, due to fibroids BSO? 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  Her husband Frances Hughes is retired from [employer].  They have twin daughters, Frances Hughes who works as an Web designer in Child psychotherapist who works in [place] for the [employer] and [employer], both 67 years old; 3d Frances Hughes is Frances Hughes who works in Psychologist, educational, age  29; their son Frances Hughes died from "colloid cysts in the brain" at the age of 57.  The patient has 6 grandchildren.  She is a Psychologist, forensic    ADVANCED DIRECTIVES: In the absence of any documents to the contrary the patient's husband is her healthcare power of attorney.   HEALTH MAINTENANCE: Social History   Tobacco Use  . Smoking status: Former Smoker    Packs/day: 0.75    Years: 10.00    Pack years: 7.50    Types: Cigarettes    Start date: 01/30/1969    Quit date: 05/23/1988    Years since quitting: 32.3  . Smokeless tobacco: Never Used  Vaping  Use  . Vaping Use: Never used  Substance Use Topics  . Alcohol use: No    Alcohol/week: 0.0 standard drinks  . Drug use: No     Colonoscopy: 03/2018 (Dr. Gala Romney), repeat recommended 2022  PAP: 01/2012, negative  Bone density: none on file   No Known Allergies  Current Outpatient Medications  Medication Sig Dispense Refill  . acetaminophen (TYLENOL) 650 MG CR tablet Take 1,300 mg by mouth 2 (two) times daily as needed for pain. (Patient not taking: Reported on 08/31/2020)    . anastrozole (ARIMIDEX) 1 MG tablet Take 1 tablet (1 mg total) by mouth daily. Start 06/08/2020 90 tablet 4  . atorvastatin (LIPITOR) 40 MG tablet TAKE 1 TABLET(40 MG) BY MOUTH DAILY 90 tablet 0  . Blood Glucose Monitoring Suppl (BLOOD GLUCOSE MONITOR SYSTEM) W/DEVICE KIT Please dispense based on patient and insurance preference. Use to monitor fasting blood sugar 1x daily. Dx: E11.9 1 each 0  . cloNIDine (CATAPRES) 0.2 MG tablet TAKE 1 TABLET(0.2 MG) BY MOUTH TWICE DAILY 60 tablet 3  . cloNIDine (CATAPRES) 0.2 MG tablet TAKE 1 TABLET(0.2 MG) BY MOUTH TWICE DAILY 60 tablet 3  . furosemide (LASIX) 20 MG tablet Take 1 tablet (20 mg total) by mouth daily as needed. 30 tablet 1  . Glucose Blood (BLOOD GLUCOSE TEST STRIPS) STRP Please dispense based on patient and insurance preference. Use to monitor fasting blood sugar 1x daily. Dx: E11.9 100 each 0  . hydrALAZINE (APRESOLINE) 50 MG  tablet Take 1 tablet (50 mg total) by mouth 3 (three) times daily. 90 tablet 3  . Lancets 28G MISC Please dispense based on patient and insurance preference. Use to monitor fasting blood sugar 1x daily. Dx: E11.9 100 each 0  . levothyroxine (SYNTHROID) 175 MCG tablet Take 1 tablet (175 mcg total) by mouth daily before breakfast. 90 tablet 1  . levothyroxine (SYNTHROID) 200 MCG tablet TAKE 1 TABLET BY MOUTH EVERY DAY BEFORE BREAKFAST (Patient not taking: Reported on 08/31/2020) 90 tablet 0  . losartan (COZAAR) 100 MG tablet TAKE 1 TABLET(100 MG) BY MOUTH DAILY 90 tablet 0  . metFORMIN (GLUCOPHAGE) 500 MG tablet TAKE 1 TABLET BY MOUTH TWICE DAILY WITH A MEAL 180 tablet 1  . potassium chloride SA (KLOR-CON) 20 MEQ tablet Take 1 tablet (20 mEq total) by mouth daily. 90 tablet 1  . spironolactone (ALDACTONE) 25 MG tablet TAKE 1/2 TABLET(12.5 MG) BY MOUTH DAILY (Patient not taking: Reported on 08/31/2020) 90 tablet 0   No current facility-administered medications for this visit.    OBJECTIVE: African-American woman who appears stated age  65:   09/02/20 1028  BP: (!) 188/106  Pulse: 93  Resp: 18  Temp: 97.7 F (36.5 C)  SpO2: 96%     Body mass index is 42.8 kg/m.   Wt Readings from Last 3 Encounters:  09/02/20 241 lb 9.6 oz (109.6 kg)  08/31/20 243 lb (110.2 kg)  07/16/20 251 lb 9.6 oz (114.1 kg)     ECOG FS:2 - Symptomatic, <50% confined to bed  Sclerae unicteric, EOMs intact Wearing a mask No cervical or supraclavicular adenopathy Lungs no rales or rhonchi Heart regular rate and rhythm Abd soft, nontender, positive bowel sounds MSK no focal spinal tenderness, no upper extremity lymphedema Neuro: nonfocal, well oriented, appropriate affect Breasts: The right breast and both axillae are benign.  The left breast is status post lumpectomy and radiation.  The cosmetic result is good.  There is no evidence of local recurrence.  LAB RESULTS:  CMP     Component Value Date/Time    NA 141 07/13/2020 1330   K 3.4 (L) 07/13/2020 1330   CL 103 07/13/2020 1330   CO2 28 07/13/2020 1330   GLUCOSE 92 07/13/2020 1330   BUN 22 07/13/2020 1330   CREATININE 0.64 07/13/2020 1330   CREATININE 0.68 06/12/2020 0930   CALCIUM 9.1 07/13/2020 1330   PROT 7.5 07/13/2020 1330   ALBUMIN 3.4 (L) 07/13/2020 1330   AST 17 07/13/2020 1330   AST 17 03/02/2020 1526   ALT 12 07/13/2020 1330   ALT 13 03/02/2020 1526   ALKPHOS 74 07/13/2020 1330   BILITOT 0.5 07/13/2020 1330   BILITOT 0.3 03/02/2020 1526   GFRNONAA >60 07/13/2020 1330   GFRNONAA >60 03/02/2020 1526   GFRNONAA 94 11/07/2018 0927   GFRAA >60 02/17/2020 0930   GFRAA 109 11/07/2018 0927    No results found for: TOTALPROTELP, ALBUMINELP, A1GS, A2GS, BETS, BETA2SER, GAMS, MSPIKE, SPEI  Lab Results  Component Value Date   WBC 6.6 06/12/2020   NEUTROABS 4,521 06/12/2020   HGB 12.1 06/12/2020   HCT 37.8 06/12/2020   MCV 82.2 06/12/2020   PLT 254 06/12/2020    No results found for: LABCA2  No components found for: SLHTDS287  No results for input(s): INR in the last 168 hours.  No results found for: LABCA2  No results found for: GOT157  No results found for: WIO035  No results found for: DHR416  No results found for: CA2729  No components found for: HGQUANT  No results found for: CEA1 / No results found for: CEA1   No results found for: AFPTUMOR  No results found for: CHROMOGRNA  No results found for: KPAFRELGTCHN, LAMBDASER, KAPLAMBRATIO (kappa/lambda light chains)  No results found for: HGBA, HGBA2QUANT, HGBFQUANT, HGBSQUAN (Hemoglobinopathy evaluation)   No results found for: LDH  No results found for: IRON, TIBC, IRONPCTSAT (Iron and TIBC)  No results found for: FERRITIN  Urinalysis    Component Value Date/Time   COLORURINE YELLOW 07/17/2015 [place] 07/17/2015 1234   LABSPEC 1.020 07/17/2015 1234   PHURINE 6.5 07/17/2015 1234   GLUCOSEU NEGATIVE 07/17/2015 1234    HGBUR 2+ (A) 07/17/2015 1234   HGBUR trace-intact 06/13/2008 [place] 07/17/2015 1234   KETONESUR NEGATIVE 07/17/2015 1234   PROTEINUR NEGATIVE 07/17/2015 1234   UROBILINOGEN 0.2 06/13/2008 1045   NITRITE NEGATIVE 07/17/2015 [place] 07/17/2015 1234    STUDIES: No results found.   ELIGIBLE FOR AVAILABLE RESEARCH PROTOCOL: Opted against COMET  ASSESSMENT: 65 y.o. [place], [place] woman status post left breast biopsy 01/06/2020 for ductal carcinoma in situ, grade 1, estrogen and progesterone receptor positive  (1) status post left lumpectomy 02/19/2020 for ductal carcinoma in situ grade 1 or 2, 1.5 cm, with negative margins.  (2) adjuvant radiation completed 04/27/2020  (3) started anastrozole 06/09/2019  (4) genetics counseling discussed--patient opts against testing   PLAN: Geanette is tolerating anastrozole well and the plan will be to continue that a total of 5 years.  I am setting her up for a bone density with her next mammogram which will be in July.  I have encouraged her to walk at least 15 minutes a day.  She understands she does not have to do it in 15-minute intervals.  She can do 5 minutes 3 times a day for example.  We again discussed genetics today and she does not want to proceed with testing  at least not at this point.  I reassured her that some discomfort soreness sensitivity and shooting pains in the surgical site does not indicate breast cancer recurrence.  She does tend to get high blood pressure when she sees [place] but we reviewed her blood pressure and other locations and her blood pressure is generally well controlled so I am not making any changes in her medications.  She will see me again in October.  From that point we will start to see her on a once a year basis  Total encounter time 20 minutes.Sarajane Jews C. Santiago Stenzel, MD 09/02/2020 10:32 AM Medical Oncology and Hematology [facility] [place], [place] [address] Tel. [phone]    Fax. [phone]   This document serves as a record of services personally performed by Lurline Del, MD. It was created on his behalf by Wilburn Mylar, a trained medical scribe. The creation of this record is based on the scribe's personal observations and the provider's statements to them.   I, Lurline Del MD, have reviewed the above documentation for accuracy and completeness, and I agree with the above.   *Total Encounter Time as defined by the Centers for Medicare and Medicaid Services includes, in addition to the face-to-face time of a patient visit (documented in the note above) non-face-to-face time: obtaining and reviewing outside history, ordering and reviewing medications, tests or procedures, care coordination (communications with other health care professionals or caregivers) and documentation in the medical record.

## 2020-09-02 ENCOUNTER — Other Ambulatory Visit: Payer: Self-pay

## 2020-09-02 ENCOUNTER — Inpatient Hospital Stay: Payer: Medicare Other | Attending: Oncology | Admitting: Oncology

## 2020-09-02 VITALS — BP 188/106 | HR 93 | Temp 97.7°F | Resp 18 | Ht 63.0 in | Wt 241.6 lb

## 2020-09-02 DIAGNOSIS — D0512 Intraductal carcinoma in situ of left breast: Secondary | ICD-10-CM | POA: Insufficient documentation

## 2020-09-02 DIAGNOSIS — Z923 Personal history of irradiation: Secondary | ICD-10-CM | POA: Diagnosis not present

## 2020-09-02 DIAGNOSIS — Z808 Family history of malignant neoplasm of other organs or systems: Secondary | ICD-10-CM | POA: Insufficient documentation

## 2020-09-02 DIAGNOSIS — Z87891 Personal history of nicotine dependence: Secondary | ICD-10-CM | POA: Insufficient documentation

## 2020-09-02 DIAGNOSIS — Z170 Estrogen receptor positive status [ER+]: Secondary | ICD-10-CM | POA: Diagnosis not present

## 2020-09-02 DIAGNOSIS — Z79811 Long term (current) use of aromatase inhibitors: Secondary | ICD-10-CM | POA: Diagnosis not present

## 2020-09-02 DIAGNOSIS — Z801 Family history of malignant neoplasm of trachea, bronchus and lung: Secondary | ICD-10-CM | POA: Diagnosis not present

## 2020-09-02 DIAGNOSIS — Z90710 Acquired absence of both cervix and uterus: Secondary | ICD-10-CM | POA: Insufficient documentation

## 2020-09-08 ENCOUNTER — Telehealth: Payer: Self-pay | Admitting: Oncology

## 2020-09-08 NOTE — Telephone Encounter (Signed)
Scheduled per 4/13 los. Called and spoke with pt confirmed 10/17 appt

## 2020-10-28 ENCOUNTER — Other Ambulatory Visit: Payer: Self-pay | Admitting: "Endocrinology

## 2020-11-17 ENCOUNTER — Telehealth: Payer: Self-pay

## 2020-11-17 ENCOUNTER — Other Ambulatory Visit: Payer: Self-pay | Admitting: Family Medicine

## 2020-11-17 NOTE — Telephone Encounter (Signed)
received rx request for statin refill. Informed pt that no refill due to not being est pt, will have to seek care elsewhere or new pt application with NP. Pt under impression of NP becoming her provider by default. Pt became displeased and decided to look for care elsewhere.

## 2020-12-30 ENCOUNTER — Ambulatory Visit
Admission: RE | Admit: 2020-12-30 | Discharge: 2020-12-30 | Disposition: A | Discharge status: Home / Self Care | Payer: Medicare Other | Source: Ambulatory Visit | Attending: Oncology | Admitting: Oncology

## 2020-12-30 ENCOUNTER — Other Ambulatory Visit: Payer: Self-pay

## 2020-12-30 DIAGNOSIS — D0512 Intraductal carcinoma in situ of left breast: Secondary | ICD-10-CM

## 2020-12-30 DIAGNOSIS — Z853 Personal history of malignant neoplasm of breast: Secondary | ICD-10-CM | POA: Diagnosis not present

## 2020-12-30 DIAGNOSIS — R922 Inconclusive mammogram: Secondary | ICD-10-CM | POA: Diagnosis not present

## 2021-01-11 ENCOUNTER — Other Ambulatory Visit: Payer: Self-pay

## 2021-01-11 ENCOUNTER — Other Ambulatory Visit (HOSPITAL_COMMUNITY)
Admission: RE | Admit: 2021-01-11 | Discharge: 2021-01-11 | Disposition: A | Discharge status: Home / Self Care | Payer: Medicare Other | Source: Ambulatory Visit | Attending: "Endocrinology | Admitting: "Endocrinology

## 2021-01-11 DIAGNOSIS — E890 Postprocedural hypothyroidism: Secondary | ICD-10-CM | POA: Diagnosis not present

## 2021-01-11 DIAGNOSIS — D3502 Benign neoplasm of left adrenal gland: Secondary | ICD-10-CM | POA: Insufficient documentation

## 2021-01-11 LAB — COMPREHENSIVE METABOLIC PANEL
ALT: 13 U/L (ref 0–44)
AST: 15 U/L (ref 15–41)
Albumin: 3.4 g/dL — ABNORMAL LOW (ref 3.5–5.0)
Alkaline Phosphatase: 84 U/L (ref 38–126)
Anion gap: 9 (ref 5–15)
BUN: 20 mg/dL (ref 8–23)
CO2: 28 mmol/L (ref 22–32)
Calcium: 9 mg/dL (ref 8.9–10.3)
Chloride: 104 mmol/L (ref 98–111)
Creatinine, Ser: 0.8 mg/dL (ref 0.44–1.00)
GFR, Estimated: >60 mL/min (ref >60)
Glucose, Bld: 99 mg/dL (ref 70–99)
Potassium: 3.4 mmol/L — ABNORMAL LOW (ref 3.5–5.1)
Sodium: 141 mmol/L (ref 135–145)
Total Bilirubin: 0.3 mg/dL (ref 0.3–1.2)
Total Protein: 7.3 g/dL (ref 6.5–8.1)

## 2021-01-11 LAB — T4, FREE: Free T4: 1.61 ng/dL — ABNORMAL HIGH (ref 0.61–1.12)

## 2021-01-11 LAB — TSH: TSH: 0.01 u[IU]/mL — ABNORMAL LOW (ref 0.350–4.500)

## 2021-01-13 ENCOUNTER — Ambulatory Visit: Payer: Medicare Other | Admitting: "Endocrinology

## 2021-01-14 LAB — METANEPHRINES, PLASMA
Metanephrine, Free: 29.2 pg/mL (ref 0.0–88.0)
Normetanephrine, Free: 71.2 pg/mL (ref 0.0–285.2)

## 2021-02-03 ENCOUNTER — Encounter: Payer: Self-pay | Admitting: "Endocrinology

## 2021-02-03 ENCOUNTER — Other Ambulatory Visit: Payer: Self-pay

## 2021-02-03 ENCOUNTER — Ambulatory Visit: Payer: Medicare Other | Admitting: "Endocrinology

## 2021-02-03 VITALS — BP 138/82 | HR 60 | Ht 63.0 in | Wt 235.0 lb

## 2021-02-03 DIAGNOSIS — I10 Essential (primary) hypertension: Secondary | ICD-10-CM

## 2021-02-03 DIAGNOSIS — D3502 Benign neoplasm of left adrenal gland: Secondary | ICD-10-CM

## 2021-02-03 DIAGNOSIS — E890 Postprocedural hypothyroidism: Secondary | ICD-10-CM | POA: Diagnosis not present

## 2021-02-03 DIAGNOSIS — E119 Type 2 diabetes mellitus without complications: Secondary | ICD-10-CM | POA: Diagnosis not present

## 2021-02-03 MED ORDER — CLONIDINE HCL 0.2 MG PO TABS: ORAL_TABLET | ORAL | 1 refills | Status: DC

## 2021-02-03 MED ORDER — SPIRONOLACTONE 25 MG PO TABS: ORAL_TABLET | ORAL | 1 refills | Status: DC

## 2021-02-03 MED ORDER — LEVOTHYROXINE SODIUM 150 MCG PO TABS: 150.0000 ug | ORAL_TABLET | Freq: Every day | ORAL | 1 refills | Status: DC

## 2021-02-03 MED ORDER — ATORVASTATIN CALCIUM 40 MG PO TABS: ORAL_TABLET | ORAL | 1 refills | Status: DC

## 2021-02-03 MED ORDER — LOSARTAN POTASSIUM 100 MG PO TABS: ORAL_TABLET | ORAL | 1 refills | Status: DC

## 2021-02-03 NOTE — Progress Notes (Signed)
02/03/2021, 12:58 PM  Endocrinology follow-up note   Subjective:    [name], female    DOB: 1956/04/11, PCP Pcp, No   Past Medical History:  Diagnosis Date   Adrenal mass, left (HCC)    Degenerative disc disease at L5-S1 level    DM (diabetes mellitus) ([place])    Type II controlled   Enlarged heart    Family history of breast cancer    Family history of colon cancer    Family history of pancreatic cancer    Family history of stomach cancer    Grave's disease    HTN (hypertension)    Hyperlipidemia    Lymphocytosis    Nephrolithiasis    OSA on CPAP    Past Surgical History:  Procedure Laterality Date   ABDOMINAL HYSTERECTOMY     BREAST BIOPSY Left 01/06/2020   BREAST LUMPECTOMY WITH RADIOACTIVE SEED LOCALIZATION Left 02/19/2020   Procedure: LEFT BREAST LUMPECTOMY WITH RADIOACTIVE SEED LOCALIZATION;  Surgeon: [name], MD;  Location: [facility];  Service: General;  Laterality: Left;   COLONOSCOPY  2011   normal colonoscopy   COLONOSCOPY WITH PROPOFOL N/A 04/16/2018   Procedure: COLONOSCOPY WITH PROPOFOL;  Surgeon: Daneil Dolin, MD;  [facility];  Service: Endoscopy;  Laterality: N/A;  9:45am   HERNIA REPAIR  2011   LITHOTRIPSY     POLYPECTOMY  04/16/2018   Procedure: POLYPECTOMY;  Surgeon: Daneil Dolin, MD;  [facility]: [facility];  Service: Endoscopy;;  (colon)   S/P Hysterectomy  10/1996   with bilat SOO seconardt to fibroids   thyroid ablation  1986   Social History   Socioeconomic History   Marital status: Married    Spouse name: Not on file   Number of children: Not on file   Years of education: Not on file   Highest education level: Not on file  Occupational History   Not on file  Tobacco Use   Smoking status: Former    Packs/day: 0.75    Years: 10.00    Pack years: 7.50    Types: Cigarettes    Start date: 01/30/1969    Quit date: 05/23/1988     Years since quitting: 32.7   Smokeless tobacco: Never  Vaping Use   Vaping Use: Never used  Substance and Sexual Activity   Alcohol use: No    Alcohol/week: 0.0 standard drinks   Drug use: No   Sexual activity: Yes  Other Topics Concern   Not on file  Social History Narrative   Not on file   Social Determinants of Health   Financial Resource Strain: Low Risk    Difficulty of Paying Living Expenses: Not hard at all  Food Insecurity: No Food Insecurity   Worried About Charity fundraiser in the Last Year: Never true   [place] in the Last Year: Never true  Transportation Needs: No Transportation Needs   Lack of Transportation (Medical): No   Lack of Transportation (Non-Medical): No  Physical Activity: Inactive   Days of Exercise per Week: 0 days   Minutes of Exercise per Session: 0 min  Stress: No Stress Concern Present   Feeling  of Stress : Not at all  Social Connections: Moderately Integrated   Frequency of Communication with Friends and Family: More than three times a week   Frequency of Social Gatherings with Friends and Family: More than three times a week   Attends Religious Services: More than 4 times per year   Active Member of Clubs or Organizations: No   Attends Archivist Meetings: Never   Marital Status: Married   Family History  Problem Relation Age of Onset   Colon cancer Mother        d. 67   Cancer Mother        metastisis sarcoma   Colon cancer Sister 38   Head & neck cancer Sister        roof of mouth   Colon polyps Sister    Diabetes Father    Hypertension Father    Cancer Father    Lung cancer Father        d. 11   Colon polyps Sister    Esophageal cancer Brother    Cancer Brother        cancer of eye   Aneurysm Maternal Aunt        brain   Cancer Maternal Uncle        NOS   COPD Maternal Uncle    Stomach cancer Maternal Grandfather 67   Heart attack Maternal Grandfather    Pancreatic cancer Paternal Grandmother         d. 67   Cancer Maternal Uncle        NOS - mother's pat 1/2 brother   Breast cancer Cousin        3 maternal cousins with breast cancer, some under 16, all different branches   Colon cancer Cousin        five mat first cousins, 20 were siblings   Cancer Cousin        2 mat 1/2 cousins with NOS cancer   Outpatient Encounter Medications as of 02/03/2021  Medication Sig   acetaminophen (TYLENOL) 650 MG CR tablet Take 1,300 mg by mouth 2 (two) times daily as needed for pain. (Patient not taking: Reported on 08/31/2020)   anastrozole (ARIMIDEX) 1 MG tablet Take 1 tablet (1 mg total) by mouth daily. Start 06/08/2020   atorvastatin (LIPITOR) 40 MG tablet TAKE 1 TABLET(40 MG) BY MOUTH DAILY   Blood Glucose Monitoring Suppl (BLOOD GLUCOSE MONITOR SYSTEM) W/DEVICE KIT Please dispense based on patient and insurance preference. Use to monitor fasting blood sugar 1x daily. Dx: E11.9   cloNIDine (CATAPRES) 0.2 MG tablet TAKE 1 TABLET(0.2 MG) BY MOUTH TWICE DAILY   furosemide (LASIX) 20 MG tablet Take 1 tablet (20 mg total) by mouth daily as needed.   Glucose Blood (BLOOD GLUCOSE TEST STRIPS) STRP Please dispense based on patient and insurance preference. Use to monitor fasting blood sugar 1x daily. Dx: E11.9   Lancets 28G MISC Please dispense based on patient and insurance preference. Use to monitor fasting blood sugar 1x daily. Dx: E11.9   levothyroxine (SYNTHROID) 150 MCG tablet Take 1 tablet (150 mcg total) by mouth daily before breakfast.   losartan (COZAAR) 100 MG tablet TAKE 1 TABLET(100 MG) BY MOUTH DAILY   metFORMIN (GLUCOPHAGE) 500 MG tablet TAKE 1 TABLET BY MOUTH TWICE DAILY WITH A MEAL   potassium chloride SA (KLOR-CON) 20 MEQ tablet TAKE 1 TABLET(20 MEQ) BY MOUTH DAILY   spironolactone (ALDACTONE) 25 MG tablet Take 1 every morning   [DISCONTINUED] atorvastatin (  LIPITOR) 40 MG tablet TAKE 1 TABLET(40 MG) BY MOUTH DAILY   [DISCONTINUED] cloNIDine (CATAPRES) 0.2 MG tablet TAKE 1 TABLET(0.2 MG)  BY MOUTH TWICE DAILY   [DISCONTINUED] cloNIDine (CATAPRES) 0.2 MG tablet TAKE 1 TABLET(0.2 MG) BY MOUTH TWICE DAILY   [DISCONTINUED] hydrALAZINE (APRESOLINE) 50 MG tablet Take 1 tablet (50 mg total) by mouth 3 (three) times daily. (Patient not taking: Reported on 02/03/2021)   [DISCONTINUED] levothyroxine (SYNTHROID) 175 MCG tablet Take 1 tablet (175 mcg total) by mouth daily before breakfast.   [DISCONTINUED] levothyroxine (SYNTHROID) 200 MCG tablet TAKE 1 TABLET BY MOUTH EVERY DAY BEFORE BREAKFAST (Patient not taking: Reported on 08/31/2020)   [DISCONTINUED] losartan (COZAAR) 100 MG tablet TAKE 1 TABLET(100 MG) BY MOUTH DAILY   [DISCONTINUED] spironolactone (ALDACTONE) 25 MG tablet TAKE 1/2 TABLET(12.5 MG) BY MOUTH DAILY (Patient not taking: Reported on 08/31/2020)   No facility-administered encounter medications on file as of 02/03/2021.   ALLERGIES: No Known Allergies  VACCINATION STATUS: Immunization History  Administered Date(s) Administered   Fluad Quad(high Dose 65+) 02/08/2019, 03/05/2020   Influenza-Unspecified 03/21/2011, 03/20/2015   Moderna Sars-Covid-2 Vaccination 07/08/2019, 07/31/2019   Pneumococcal Polysaccharide-23 04/13/2007, 03/05/2020   Tdap 09/14/2012    HPI ADELIS DOCTER is 65 y.o. female who returns for follow-up with previsit labs.  She was initially seen in consultation for  left adrenal adenoma requested by Pcp, No. The left adrenal adenoma was found to be stable and nonfunctioning.     She has a long and complicated medical history.  See notes from previous visits.    History is obtained directly from the patient and review of her medical records.  she has history of hypertension on multiple medications.  She is currently doing much better on losartan, clonidine.she has not taken hydralazine in several weeks, neither did she take her spironolactone.  She was taken off of amlodipine during her last visit.   She presents with significant weight loss, feeling  better, has more consistent energy.  She also has history of Graves' disease which required radioactive iodine thyroid ablation in 2006.  She is currently on levothyroxine 175 mcg p.o. daily.  She reports compliance and consistency.  Her previsit labs are consistent with over replacement.     She is known to have left adrenal mass at least since September 2005.  She has a series of abdominal and thoracic imaging studies since then and it was documented that this lesion was stable and displays features consistent with benign adenoma.  During her last abdominal CT in 2016 it measured 3.5 cm (3.2 cm in 2007) and the lesion demonstrated washout characteristics consistent with benign adrenal adenoma with absolute washout of 93%.  The right adrenal glands were normal. Over the years, she underwent various endocrine work-up for this adenoma and none of them were significant for functioning lesion. Most recently in October 2021 she underwent 24-hour urine collection for metanephrines which were normal.  Also normal plasma metanephrines before this visit on January 11, 2021.  Along with her hypertension, patient has had chronic hypokalemia which required intermittent supplement with potassium.  She is currently on lower dose potassium 20 mEq daily. She is not on regular diuresis with Lasix anymore, she uses Lasix 20 mg p.o. as needed.  She has well-controlled type 2 diabetes with recent A1c of 5.9%, currently on Metformin 500 mg p.o. twice daily.     She is recently diagnosed with breast cancer s/p chemo/surgery.      She has sleep apnea  on CPAP machine. She does not consume licorice, no family history of premature coronary artery disease, CVA, adrenal, thyroid, parathyroid, pituitary dysfunctions.  Review of Systems  Minimally fluctuating body weight.  Objective:    Vitals with BMI 02/03/2021 09/02/2020 08/31/2020  Height _0  _1  _2   Weight 235 lbs 241 lbs 10 oz 243 lbs  BMI 41.64 93.79 02.40   Systolic 973 532 992  Diastolic 82 426 84  Pulse 60 93 85    BP 138/82   Pulse 60   Ht _3  (1.6 m)   Wt 235 lb (106.6 kg)   BMI 41.63 kg/m   Wt Readings from Last 3 Encounters:  02/03/21 235 lb (106.6 kg)  09/02/20 241 lb 9.6 oz (109.6 kg)  08/31/20 243 lb (110.2 kg)    Physical Exam She has bilateral peripheral edema.  CMP ( most recent) CMP     Component Value Date/Time   NA 141 01/11/2021 1401   K 3.4 (L) 01/11/2021 1401   CL 104 01/11/2021 1401   CO2 28 01/11/2021 1401   GLUCOSE 99 01/11/2021 1401   BUN 20 01/11/2021 1401   CREATININE 0.80 01/11/2021 1401   CREATININE 0.68 06/12/2020 0930   CALCIUM 9.0 01/11/2021 1401   PROT 7.3 01/11/2021 1401   ALBUMIN 3.4 (L) 01/11/2021 1401   AST 15 01/11/2021 1401   AST 17 03/02/2020 1526   ALT 13 01/11/2021 1401   ALT 13 03/02/2020 1526   ALKPHOS 84 01/11/2021 1401   BILITOT 0.3 01/11/2021 1401   BILITOT 0.3 03/02/2020 1526   GFRNONAA >60 01/11/2021 1401   GFRNONAA >60 03/02/2020 1526   GFRNONAA 94 11/07/2018 0927   GFRAA >60 02/17/2020 0930   GFRAA 109 11/07/2018 0927     Diabetic Labs (most recent): Lab Results  Component Value Date   HGBA1C 5.7 (H) 06/12/2020   HGBA1C 5.9 (H) 03/03/2020   HGBA1C 6.0 (H) 12/18/2019     Lipid Panel ( most recent) Lipid Panel     Component Value Date/Time   CHOL 180 06/12/2020 0930   TRIG 77 06/12/2020 0930   HDL 56 06/12/2020 0930   CHOLHDL 3.2 06/12/2020 0930   VLDL 21 09/09/2016 1311   LDLCALC 107 (H) 06/12/2020 0930   LDLDIRECT 110 (H) 01/21/2012 0950     Recent Results (from the past 2160 hour(s))  TSH     Status: Abnormal   Collection Time: 01/11/21  2:00 PM  Result Value Ref Range   TSH 0.010 (L) 0.350 - 4.500 uIU/mL    Comment: Performed by a 3rd Generation assay with a functional sensitivity of <=0.01 uIU/mL. Performed at [facility], [address] [place] [address] [place], [place] [address]   T4, free     Status: Abnormal   Collection Time: 01/11/21  2:01 PM   Result Value Ref Range   Free T4 1.61 (H) 0.61 - 1.12 ng/dL    Comment: (NOTE) Biotin ingestion may interfere with free T4 tests. If the results are inconsistent with the TSH level, previous test results, or the clinical presentation, then consider biotin interference. If needed, order repeat testing after stopping biotin. Performed at [facility], [place] [address], [place], [place] [address]   Metanephrines, plasma     Status: None   Collection Time: 01/11/21  2:01 PM  Result Value Ref Range   Normetanephrine, Free 71.2 0.0 - 285.2 pg/mL    Comment: (NOTE) This test was developed and its performance characteristics determined by [facility]. It  has not been cleared or approved by the Food and Drug Administration.    Metanephrine, Free 29.2 0.0 - 88.0 pg/mL    Comment: (NOTE) This test was developed and its performance characteristics determined by [facility]. It has not been cleared or approved by the Food and Drug Administration. Performed At: [facility] [place], [place] [id number] Rush Farmer MD [id number]   Comprehensive metabolic panel     Status: Abnormal   Collection Time: 01/11/21  2:01 PM  Result Value Ref Range   Sodium 141 135 - 145 mmol/L   Potassium 3.4 (L) 3.5 - 5.1 mmol/L   Chloride 104 98 - 111 mmol/L   CO2 28 22 - 32 mmol/L   Glucose, Bld 99 70 - 99 mg/dL    Comment: Glucose reference range applies only to samples taken after fasting for at least 8 hours.   BUN 20 8 - 23 mg/dL   Creatinine, Ser 0.80 0.44 - 1.00 mg/dL   Calcium 9.0 8.9 - 10.3 mg/dL   Total Protein 7.3 6.5 - 8.1 g/dL   Albumin 3.4 (L) 3.5 - 5.0 g/dL   AST 15 15 - 41 U/L   ALT 13 0 - 44 U/L   Alkaline Phosphatase 84 38 - 126 U/L   Total Bilirubin 0.3 0.3 - 1.2 mg/dL   GFR, Estimated >60 >60 mL/min    Comment: (NOTE) Calculated using the CKD-EPI Creatinine Equation (2021)    Anion gap 9 5 - 15    Comment: Performed at [facility], [address]  [place], [place] [address]     Assessment & Plan:   1. Adrenal adenoma- left -Her plasma free metanephrines were normal.  Review of all of her previous work-up points to a stable nonfunctioning left adrenal adenoma.  This adenoma is not contributing to her history of hypertension.    This lesion was documented to be stable since 2005, will not need surgical intervention at this time.   Her mild hypokalemia seems to be diuretic related, responding to low-dose potassium supplement.    She does not have paroxysmal symptoms to suggest pheochromocytoma.  Her recent work-up rules out  Cushing syndrome. Her recent work-up with aldosterone, plasma renin activity are not conclusive for primary hyperaldosteronism.  She will not need surgical treatment for the adenoma.   2.  Essential hypertension  She is responding to a lesser number of antihypertensive medications.  She is advised to continue clonidine 0.2 mg p.o. twice daily, losartan 100 mg p.o. daily, and spironolactone 25 mg p.o. daily.  Without hydralazine for several weeks, her blood pressure was controlled.  She is advised to discontinue hydralazine as well as amlodipine.    3.  RAI induced hypothyroidism  Regarding her RAI induced hypothyroidism: Her previsit thyroid function tests are still consistent with slight over replacement.  I discussed and lowered her levothyroxine to 150 mcg p.o. daily before breakfast.     - We discussed about the correct intake of her thyroid hormone, on empty stomach at fasting, with water, separated by at least 30 minutes from breakfast and other medications,  and separated by more than 4 hours from calcium, iron, multivitamins, acid reflux medications (PPIs). -Patient is made aware of the fact that thyroid hormone replacement is needed for life, dose to be adjusted by periodic monitoring of thyroid function tests.  4.  Type 2 diabetes: Controlled with A1c of 5.9%.  She is advised to continue metformin 500 mg p.o.  twice daily.   -  she is advised to seek and establish a primary care provider in town, Dr. Posey Pronto was suggested for her primary care needs.   I spent 41 minutes in the care of the patient today including review of labs from Thyroid Function, CMP, and other relevant labs ; imaging/biopsy records (current and previous including abstractions from other facilities); face-to-face time discussing  her lab results and symptoms, medications doses, her options of short and long term treatment based on the latest standards of care / guidelines;   and documenting the encounter.  Frances Hughes  participated in the discussions, expressed understanding, and voiced agreement with the above plans.  All questions were answered to her satisfaction. she is encouraged to contact clinic should she have any questions or concerns prior to her return visit.   Follow up plan: Return in about 6 months (around 08/03/2021) for F/U with Pre-visit Labs, A1c -NV.   Glade Lloyd, MD [facility] [address] [place], [place] [address] Phone: [phone]  Fax: [phone]     02/03/2021, 12:58 PM  This note was partially dictated with voice recognition software. Similar sounding words can be transcribed inadequately or may not  be corrected upon review.

## 2021-02-06 ENCOUNTER — Other Ambulatory Visit: Payer: Self-pay | Admitting: "Endocrinology

## 2021-03-06 NOTE — Progress Notes (Signed)
Frances Hughes  [phone] [phone]     ID: Frances Hughes DOB: Oct 01, 1955  MR#: [id number]  GDJ#:[id number]  Patient Care Team: Pcp, No as PCP - General Harl Bowie Alphonse Guild, MD as PCP - Cardiology (Cardiology) Gala Romney Cristopher Estimable, MD as Consulting Physician (Gastroenterology) Mauro Kaufmann, RN as Oncology Nurse Navigator Rockwell Germany, RN as Oncology Nurse Navigator Helmut Hennon, Virgie Dad, MD as Consulting Physician (Oncology) Kyung Rudd, MD as Consulting Physician (Radiation Oncology) [name], MD as Consulting Physician (General Surgery) Chauncey Cruel, MD OTHER MD:  CHIEF COMPLAINT: noninvasive breast cancer, estrogen receptor positive  CURRENT TREATMENT: anastrozole   INTERVAL HISTORY: Frances Hughes returns today for follow up of her noninvasive breast cancer.  She is accompanied by her husband  She started anastrozole on 06/08/2020.  She is having some hot flashes that particularly wake her up at night.  Vaginal dryness is not a concern.  Since her last visit, she underwent bilateral diagnostic mammography with tomography at [facility] on 12/30/2020 showing: breast density category B; postsurgical changes in left breast including seroma; no evidence of malignancy in either breast.   She is scheduled for bone density screening on 03/10/2021.   REVIEW OF SYSTEMS: Frances Hughes is not exercising regularly.  If she stands for a while her knees started to burn.  She is known to have degenerative disc disease.  She does not have any exercise equipment at home.  She is not a member of the gym.  A detailed review of systems today was otherwise stable   COVID 19 VACCINATION STATUS: fully vaccinated Levan Hurst), x4 as of October 2022; had COVID November 2020   HISTORY OF CURRENT ILLNESS: From the original intake note:  [name] had routine screening mammography on 12/12/2019 showing a possible abnormality in the left breast. She underwent  left diagnostic mammography with tomography at [facility] on 12/24/2019 showing: breast density category B; upper-outer left breast calcifications spanning 1.5 cm.  Accordingly on 01/06/2020 she proceeded to biopsy of the left breast area in question. The pathology from this procedure ([id number]) showed: ductal carcinoma in situ, low grade, with calcifications. Prognostic indicators significant for: estrogen receptor, 100% positive with strong staining intensity and progesterone receptor, 60% positive with weak staining intensity.   She opted to proceed with lumpectomy on 02/19/2020 under Dr. Ninfa Linden. Pathology from the procedure [id number]) showed: ductal carcinoma in situ, low to intermediate grade, 1.5 cm, with calcifications and focal necrosis; lobular neoplasia; margins not involved.  The patient's subsequent history is as detailed below.   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   Enlarged heart    Family history of breast cancer    Family history of colon cancer    Family history of pancreatic cancer    Family history of stomach cancer    Grave's disease    HTN (hypertension)    Hyperlipidemia    Lymphocytosis    Nephrolithiasis    OSA on CPAP     PAST SURGICAL HISTORY: Past Surgical History:  Procedure Laterality Date   ABDOMINAL HYSTERECTOMY     BREAST BIOPSY Left 01/06/2020   BREAST LUMPECTOMY WITH RADIOACTIVE SEED LOCALIZATION Left 02/19/2020   Procedure: LEFT BREAST LUMPECTOMY WITH RADIOACTIVE SEED LOCALIZATION;  Surgeon: [name], MD;  Location: [facility];  Service: General;  Laterality: Left;   COLONOSCOPY  2011   normal colonoscopy   COLONOSCOPY WITH PROPOFOL N/A 04/16/2018   Procedure: COLONOSCOPY WITH PROPOFOL;  Surgeon: Daneil Dolin, MD;  [facility];  Service:  Endoscopy;  Laterality: N/A;  9:45am   HERNIA REPAIR  2011   LITHOTRIPSY     POLYPECTOMY  04/16/2018   Procedure: POLYPECTOMY;  Surgeon: Daneil Dolin, MD;  [facility];  Service: Endoscopy;;  (colon)   S/P Hysterectomy  10/1996   with bilat SOO seconardt to fibroids   thyroid ablation  1986    FAMILY HISTORY: Family History  Problem Relation Age of Onset   Colon cancer Mother        d. 45   Cancer Mother        metastisis sarcoma   Colon cancer Sister 49   Head & neck cancer Sister        roof of mouth   Colon polyps Sister    Diabetes Father    Hypertension Father    Cancer Father    Lung cancer Father        d. 75   Colon polyps Sister    Esophageal cancer Brother    Cancer Brother        cancer of eye   Aneurysm Maternal Aunt        brain   Cancer Maternal Uncle        NOS   COPD Maternal Uncle    Stomach cancer Maternal Grandfather 76   Heart attack Maternal Grandfather    Pancreatic cancer Paternal Grandmother        d. 51   Cancer Maternal Uncle        NOS - mother's pat 1/2 brother   Breast cancer Cousin        3 maternal cousins with breast cancer, some under 55, all different branches   Colon cancer Cousin        five mat first cousins, 64 were siblings   Cancer Cousin        2 mat 1/2 cousins with NOS cancer  The patient's father died from lung cancer at the age of 77.  He worked in [place] and also smoked.  The patient's mother had "cancer all over her abdomen" and died from bowel perforation.  The patient tells me she has 4 cousins with colon cancer and a sister who died from colon cancer.  One brother died from esophageal cancer.   GYNECOLOGIC HISTORY:  No LMP recorded. Patient has had a hysterectomy. Menarche: 65 years old Age at first live birth: 65 years old GX P 4 HRT no  Hysterectomy? Yes, 10/1996, due to fibroids BSO? 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  Her husband Frances Hughes is retired from [employer].  They have twin daughters, Frances Hughes who works as an Web designer in Child psychotherapist who works in [place] for the [employer] and [employer], both 78 years old; 3d daughter is [name] who works in Psychologist, educational, age 38; their son [name] died from "colloid cysts in the brain" at the age of 39.  The patient has 6 grandchildren.  She is a Psychologist, forensic    ADVANCED DIRECTIVES: In the absence of any documents to the contrary the patient's husband is her healthcare power of attorney.   HEALTH MAINTENANCE: Social History   Tobacco Use  Smoking status: Former    Packs/day: 0.75    Years: 10.00    Pack years: 7.50    Types: Cigarettes    Start date: 01/30/1969    Quit date: 05/23/1988    Years since quitting: 32.8   Smokeless tobacco: Never  Vaping Use   Vaping Use: Never used  Substance Use Topics   Alcohol use: No    Alcohol/week: 0.0 standard drinks   Drug use: No     Colonoscopy: 03/2018 (Dr. Gala Romney), repeat recommended 2022  PAP: 01/2012, negative  Bone density: none on file   No Known Allergies  Current Outpatient Medications  Medication Sig Dispense Refill   acetaminophen (TYLENOL) 650 MG CR tablet Take 1,300 mg by mouth 2 (two) times daily as needed for pain. (Patient not taking: Reported on 08/31/2020)     anastrozole (ARIMIDEX) 1 MG tablet Take 1 tablet (1 mg total) by mouth daily. Start 06/08/2020 90 tablet 4   atorvastatin (LIPITOR) 40 MG tablet TAKE 1 TABLET(40 MG) BY MOUTH DAILY 90 tablet 1   Blood Glucose Monitoring Suppl (BLOOD GLUCOSE MONITOR SYSTEM) W/DEVICE KIT Please dispense based on patient and insurance preference. Use to monitor fasting blood sugar 1x daily. Dx: E11.9 1 each 0   cloNIDine (CATAPRES) 0.2 MG tablet TAKE 1 TABLET(0.2 MG) BY MOUTH TWICE DAILY 180 tablet 1   furosemide (LASIX) 20 MG tablet Take 1 tablet (20 mg total) by mouth daily as needed. 30 tablet 1   Glucose Blood (BLOOD  GLUCOSE TEST STRIPS) STRP Please dispense based on patient and insurance preference. Use to monitor fasting blood sugar 1x daily. Dx: E11.9 100 each 0   Lancets 28G MISC Please dispense based on patient and insurance preference. Use to monitor fasting blood sugar 1x daily. Dx: E11.9 100 each 0   levothyroxine (SYNTHROID) 150 MCG tablet Take 1 tablet (150 mcg total) by mouth daily before breakfast. 90 tablet 1   losartan (COZAAR) 100 MG tablet TAKE 1 TABLET(100 MG) BY MOUTH DAILY 90 tablet 1   metFORMIN (GLUCOPHAGE) 500 MG tablet TAKE 1 TABLET BY MOUTH TWICE DAILY WITH A MEAL 180 tablet 1   potassium chloride SA (KLOR-CON) 20 MEQ tablet TAKE 1 TABLET(20 MEQ) BY MOUTH DAILY 90 tablet 0   spironolactone (ALDACTONE) 25 MG tablet Take 1 every morning 90 tablet 1   No current facility-administered medications for this visit.    OBJECTIVE: African-American woman who appears stated age  21:   03/08/21 1011  BP: (!) 145/75  Pulse: 86  Resp: 18  Temp: 97.9 F (36.6 C)  SpO2: 98%      Body mass index is 41.38 kg/m.   Wt Readings from Last 3 Encounters:  03/08/21 233 lb 9.6 oz (106 kg)  02/03/21 235 lb (106.6 kg)  09/02/20 241 lb 9.6 oz (109.6 kg)     ECOG FS:2 - Symptomatic, <50% confined to bed  Sclerae unicteric, EOMs intact Wearing a mask No cervical or supraclavicular adenopathy Lungs no rales or rhonchi Heart regular rate and rhythm Abd soft, obese, nontender, positive bowel sounds MSK no focal spinal tenderness, no upper extremity lymphedema Neuro: nonfocal, well oriented, appropriate affect Breasts: The right breast is unremarkable.  The left breast has undergone lumpectomy and radiation.  There is still mild hyperpigmentation and there is a palpable seroma as before, but there is no evidence of local recurrence.  Both axillae are benign.   LAB RESULTS:  CMP     Component Value Date/Time   NA  141 01/11/2021 1401   K 3.4 (L) 01/11/2021 1401   CL 104 01/11/2021 1401    CO2 28 01/11/2021 1401   GLUCOSE 99 01/11/2021 1401   BUN 20 01/11/2021 1401   CREATININE 0.80 01/11/2021 1401   CREATININE 0.68 06/12/2020 0930   CALCIUM 9.0 01/11/2021 1401   PROT 7.3 01/11/2021 1401   ALBUMIN 3.4 (L) 01/11/2021 1401   AST 15 01/11/2021 1401   AST 17 03/02/2020 1526   ALT 13 01/11/2021 1401   ALT 13 03/02/2020 1526   ALKPHOS 84 01/11/2021 1401   BILITOT 0.3 01/11/2021 1401   BILITOT 0.3 03/02/2020 1526   GFRNONAA >60 01/11/2021 1401   GFRNONAA >60 03/02/2020 1526   GFRNONAA 94 11/07/2018 0927   GFRAA >60 02/17/2020 0930   GFRAA 109 11/07/2018 0927    No results found for: TOTALPROTELP, ALBUMINELP, A1GS, A2GS, BETS, BETA2SER, GAMS, MSPIKE, SPEI  Lab Results  Component Value Date   WBC 6.6 06/12/2020   NEUTROABS 4,521 06/12/2020   HGB 12.1 06/12/2020   HCT 37.8 06/12/2020   MCV 82.2 06/12/2020   PLT 254 06/12/2020    No results found for: LABCA2  No components found for: VPXTGG269  No results for input(s): INR in the last 168 hours.  No results found for: LABCA2  No results found for: SWN462  No results found for: VOJ500  No results found for: XFG182  No results found for: CA2729  No components found for: HGQUANT  No results found for: CEA1 / No results found for: CEA1   No results found for: AFPTUMOR  No results found for: CHROMOGRNA  No results found for: KPAFRELGTCHN, LAMBDASER, KAPLAMBRATIO (kappa/lambda light chains)  No results found for: HGBA, HGBA2QUANT, HGBFQUANT, HGBSQUAN (Hemoglobinopathy evaluation)   No results found for: LDH  No results found for: IRON, TIBC, IRONPCTSAT (Iron and TIBC)  No results found for: FERRITIN  Urinalysis    Component Value Date/Time   COLORURINE YELLOW 07/17/2015 [place] 07/17/2015 1234   LABSPEC 1.020 07/17/2015 1234   PHURINE 6.5 07/17/2015 1234   GLUCOSEU NEGATIVE 07/17/2015 1234   HGBUR 2+ (A) 07/17/2015 1234   HGBUR trace-intact 06/13/2008 [place] 07/17/2015 1234   KETONESUR NEGATIVE 07/17/2015 1234   PROTEINUR NEGATIVE 07/17/2015 1234   UROBILINOGEN 0.2 06/13/2008 1045   NITRITE NEGATIVE 07/17/2015 [place] 07/17/2015 1234    STUDIES: No results found.   ELIGIBLE FOR AVAILABLE RESEARCH PROTOCOL: Opted against COMET  ASSESSMENT: 64 y.o. [place], [place] woman status post left breast biopsy 01/06/2020 for ductal carcinoma in situ, grade 1, estrogen and progesterone receptor positive  (1) status post left lumpectomy 02/19/2020 for ductal carcinoma in situ grade 1 or 2, 1.5 cm, with negative margins.  (2) adjuvant radiation completed 04/27/2020  (3) started anastrozole 06/09/2019  (4) genetics counseling discussed--patient opts against testing   PLAN: Wesleigh is a little over a year out from definitive surgery for her breast cancer with no evidence of disease recurrence.  This is very favorable.  She is tolerating anastrozole well and the plan will be to continue that a total of 5 years.  She has a bone density due later this week.  Hopefully we will get good news there otherwise we will consider ibandronate  We discussed exercise and I suggested she consider either a stationary bike or perhaps swimming in a gym.  Walking would be difficult because she has some degenerative disc disease and knee issues.  For  her nighttime hot flashes which do wake her up were going to give gabapentin a try.  She has a good understanding of the possible toxicities side effects and complications of this agent  Otherwise she will see [place] again in 1 year.  She knows to call for any other issue that may develop before then  Total encounter time 25 minutes.Sarajane Jews C. Maxxon Schwanke, MD 03/08/2021 10:14 AM Medical Oncology and Hematology [facility] [place], [place] [address] Tel. [phone]    Fax. [phone]   This document serves as a record of services personally performed by Lurline Del, MD. It was created on his behalf by Wilburn Mylar, a trained medical scribe. The creation of this record is based on the scribe's personal observations and the provider's statements to them.   I, Lurline Del MD, have reviewed the above documentation for accuracy and completeness, and I agree with the above.   *Total Encounter Time as defined by the Centers for Medicare and Medicaid Services includes, in addition to the face-to-face time of a patient visit (documented in the note above) non-face-to-face time: obtaining and reviewing outside history, ordering and reviewing medications, tests or procedures, care coordination (communications with other health care professionals or caregivers) and documentation in the medical record.

## 2021-03-08 ENCOUNTER — Inpatient Hospital Stay: Payer: Medicare Other | Attending: Oncology | Admitting: Oncology

## 2021-03-08 ENCOUNTER — Other Ambulatory Visit: Payer: Self-pay

## 2021-03-08 VITALS — BP 145/75 | HR 86 | Temp 97.9°F | Resp 18 | Wt 233.6 lb

## 2021-03-08 DIAGNOSIS — Z79811 Long term (current) use of aromatase inhibitors: Secondary | ICD-10-CM | POA: Insufficient documentation

## 2021-03-08 DIAGNOSIS — Z923 Personal history of irradiation: Secondary | ICD-10-CM | POA: Diagnosis not present

## 2021-03-08 DIAGNOSIS — Z90710 Acquired absence of both cervix and uterus: Secondary | ICD-10-CM | POA: Diagnosis not present

## 2021-03-08 DIAGNOSIS — Z801 Family history of malignant neoplasm of trachea, bronchus and lung: Secondary | ICD-10-CM | POA: Diagnosis not present

## 2021-03-08 DIAGNOSIS — N951 Menopausal and female climacteric states: Secondary | ICD-10-CM | POA: Insufficient documentation

## 2021-03-08 DIAGNOSIS — Z87891 Personal history of nicotine dependence: Secondary | ICD-10-CM | POA: Insufficient documentation

## 2021-03-08 DIAGNOSIS — Z170 Estrogen receptor positive status [ER+]: Secondary | ICD-10-CM | POA: Diagnosis not present

## 2021-03-08 DIAGNOSIS — D0512 Intraductal carcinoma in situ of left breast: Secondary | ICD-10-CM | POA: Insufficient documentation

## 2021-03-08 DIAGNOSIS — Z800 Family history of malignant neoplasm of digestive organs: Secondary | ICD-10-CM | POA: Diagnosis not present

## 2021-03-08 DIAGNOSIS — Z803 Family history of malignant neoplasm of breast: Secondary | ICD-10-CM | POA: Insufficient documentation

## 2021-03-08 MED ORDER — ANASTROZOLE 1 MG PO TABS: 1.0000 mg | ORAL_TABLET | Freq: Every day | ORAL | 4 refills | Status: DC

## 2021-03-08 MED ORDER — GABAPENTIN 300 MG PO CAPS: 300.0000 mg | ORAL_CAPSULE | Freq: Every day | ORAL | 4 refills | Status: DC

## 2021-03-10 ENCOUNTER — Ambulatory Visit (HOSPITAL_COMMUNITY)
Admission: RE | Admit: 2021-03-10 | Discharge: 2021-03-10 | Disposition: A | Discharge status: Home / Self Care | Payer: Medicare Other | Source: Ambulatory Visit | Attending: Family Medicine | Admitting: Family Medicine

## 2021-03-10 ENCOUNTER — Other Ambulatory Visit: Payer: Self-pay

## 2021-03-10 DIAGNOSIS — Z780 Asymptomatic menopausal state: Secondary | ICD-10-CM | POA: Insufficient documentation

## 2021-03-10 DIAGNOSIS — M85852 Other specified disorders of bone density and structure, left thigh: Secondary | ICD-10-CM | POA: Diagnosis not present

## 2021-03-12 ENCOUNTER — Encounter: Payer: Self-pay | Admitting: *Deleted

## 2021-03-15 ENCOUNTER — Encounter: Payer: Self-pay | Admitting: *Deleted

## 2021-03-23 IMAGING — MG MM BREAST BX W LOC DEV 1ST LESION IMAGE BX SPEC STEREO GUIDE*L*
4 of 16 series · 4 of 40 positions shown · non-contrast
Comparison: Previous exams.
COMPARISON: Previous exams.

Addendum:
CLINICAL DATA: Biopsy of calcifications in the upper-outer left
breast

EXAM:
LEFT BREAST STEREOTACTIC CORE NEEDLE BIOPSY

[L (1 of 3)]
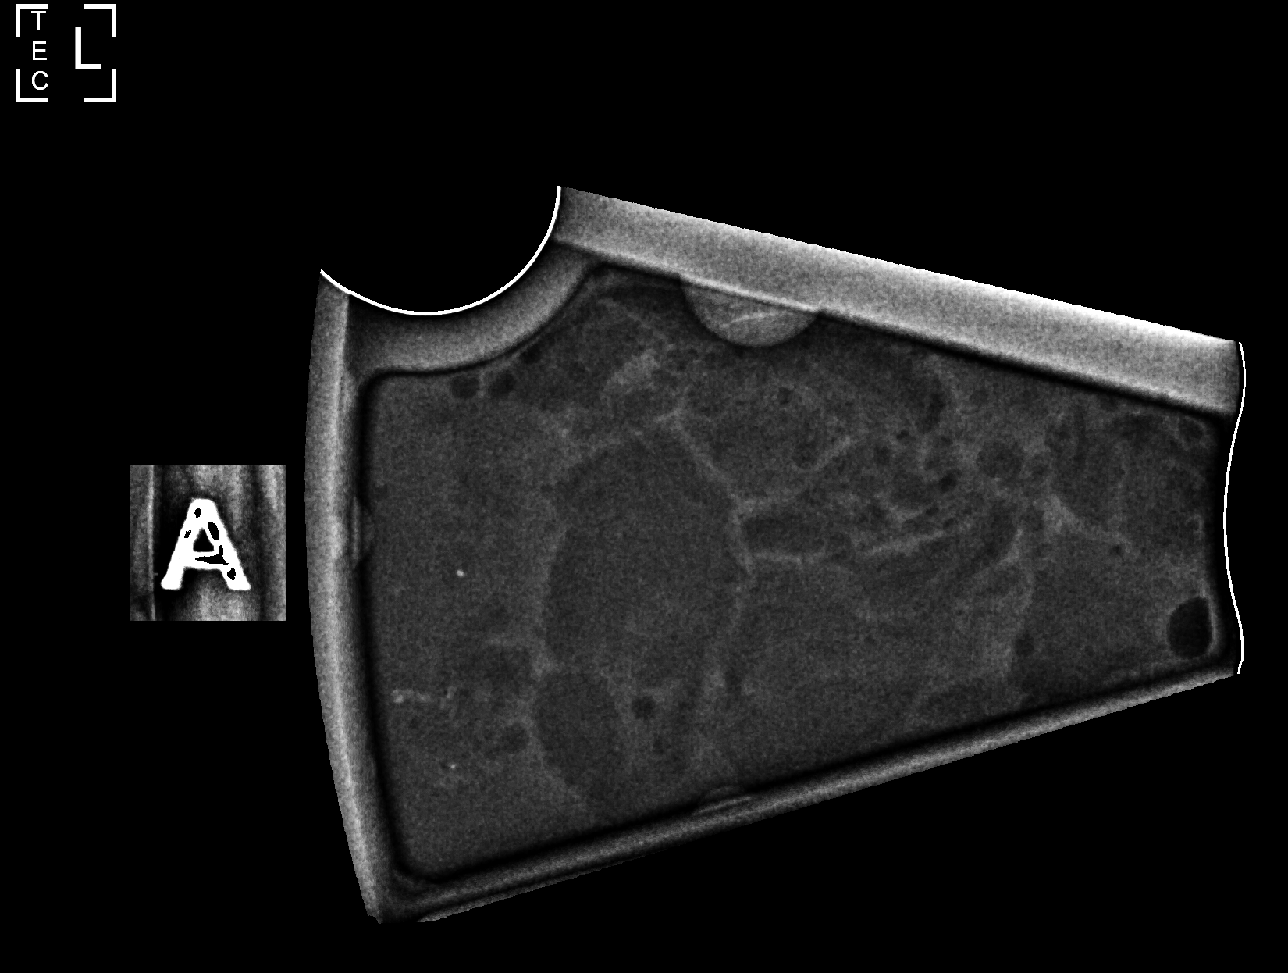

[L (2 of 3)]
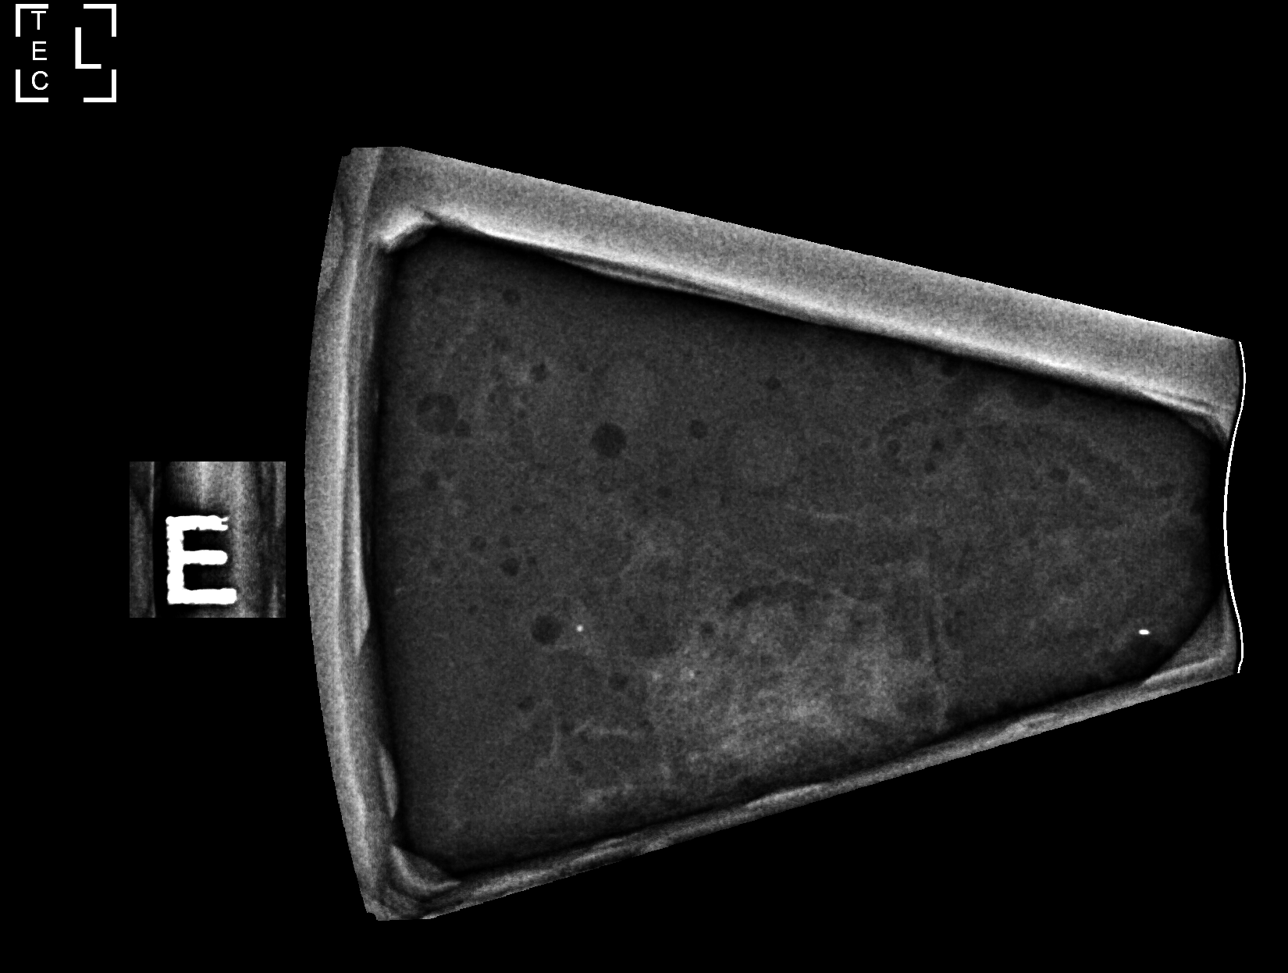

[L (3 of 3)]
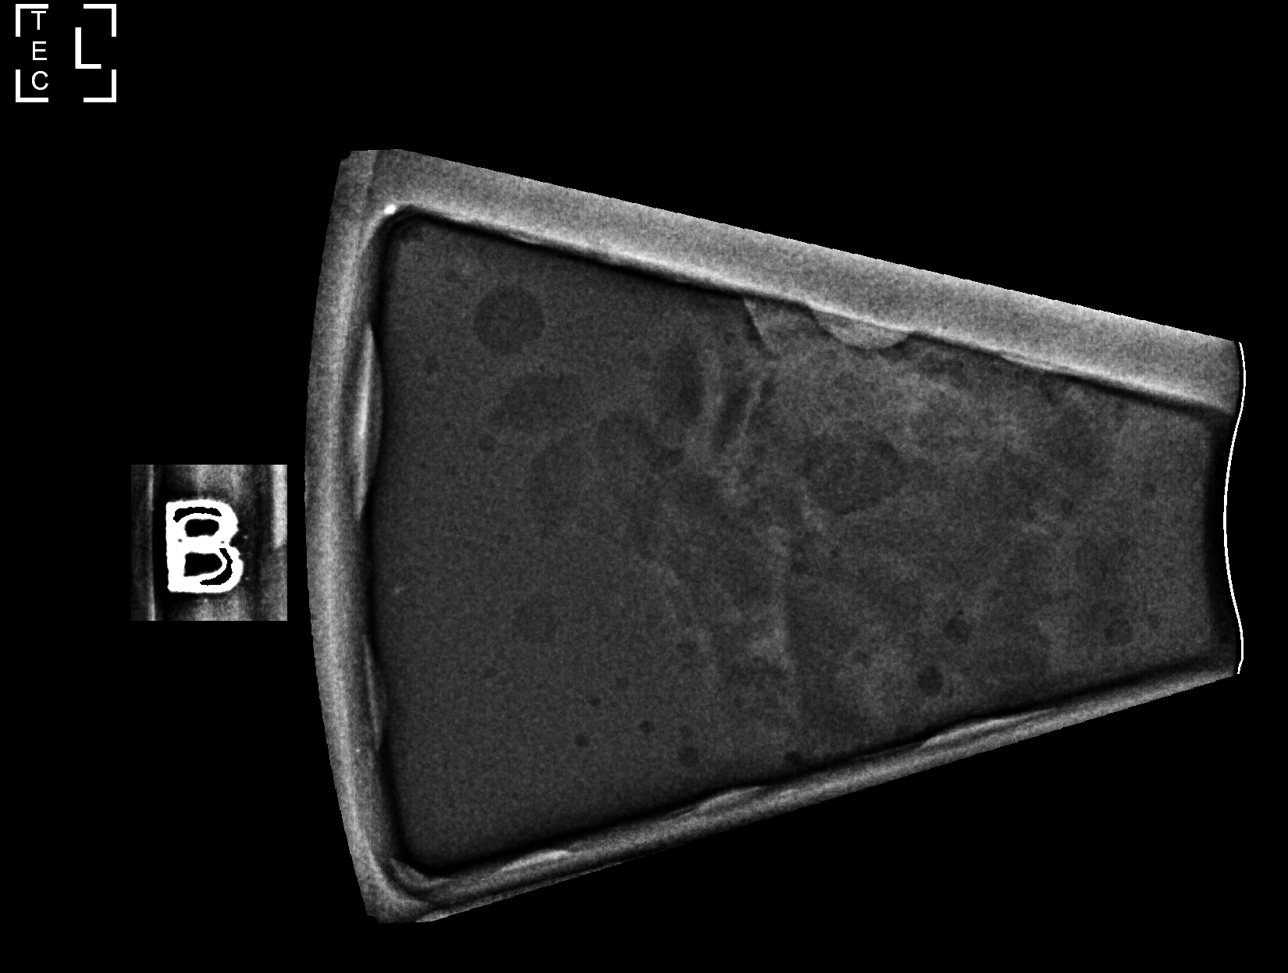

[L ML]
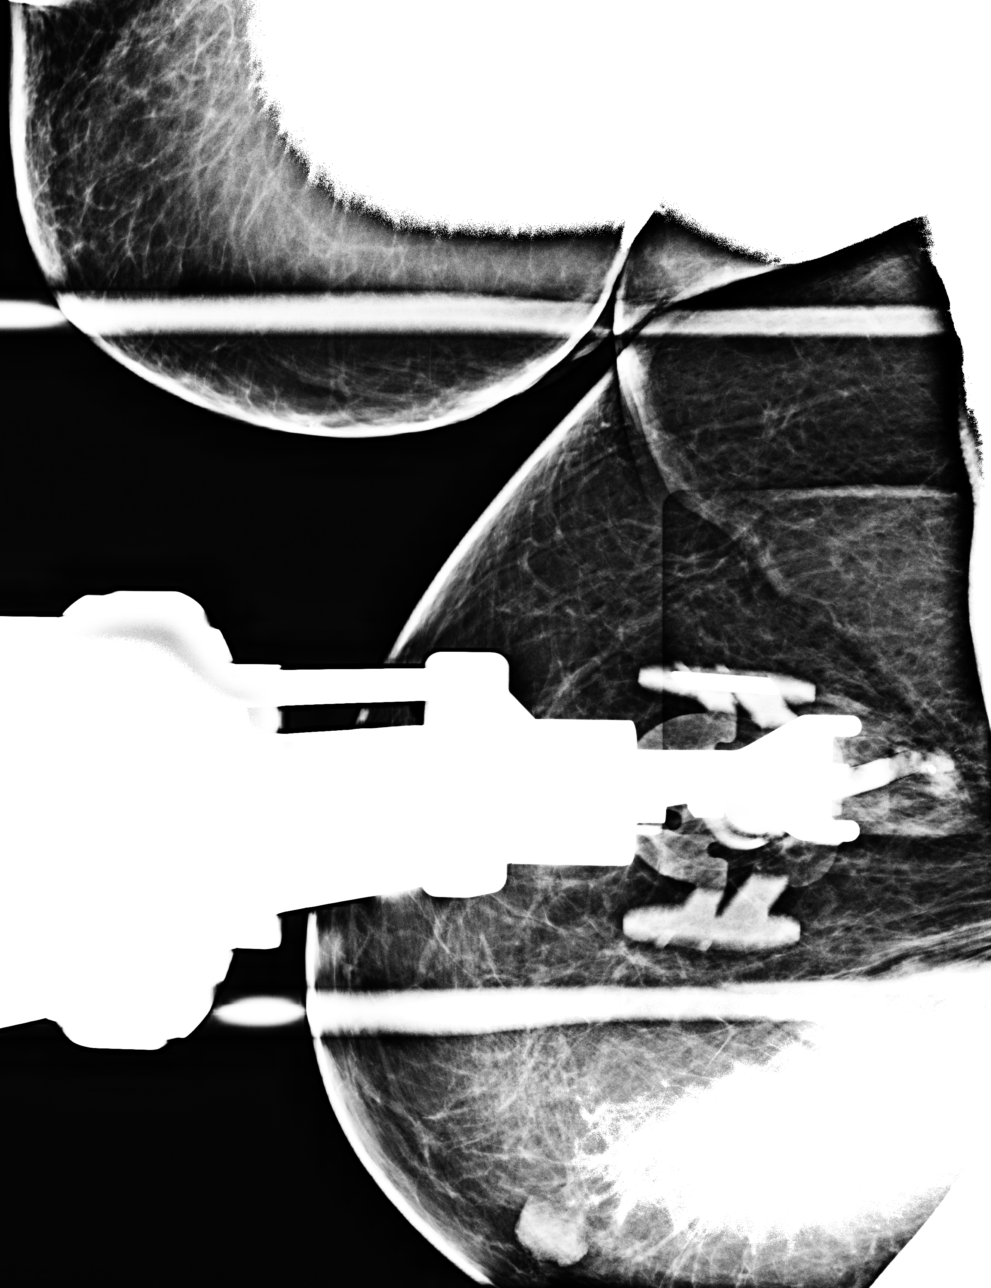

[4 of 40 positions shown; findings below may reference images not displayed]



Using sterile technique and 1% Lidocaine as local anesthetic, under
stereotactic guidance, a 9 gauge vacuum assisted device was used to
perform core needle biopsy of calcifications in the upper-outer left
breast using a lateral approach. Specimen radiograph was performed
showing calcifications in 3 core samples. Specimens with
calcifications are identified for pathology.

Lesion quadrant: Left upper outer

At the conclusion of the procedure, coil shaped tissue marker clip
was deployed into the biopsy cavity. Follow-up 2-view mammogram was
performed and dictated separately.
IMPRESSION: Stereotactic-guided biopsy of calcifications in the upper-outer left
breast. No apparent complications.

ADDENDUM:
Pathology revealed LOW GRADE DUCTAL CARCINOMA IN SITU WITH
CALCIFICATIONS of the LEFT breast, upper outer. This was found to be
concordant by Dr. Chavi Moran.

Pathology results were discussed with the patient by telephone. The
patient reported doing well after the biopsy with tenderness at the
site. Post biopsy instructions and care were reviewed and questions
were answered. The patient was encouraged to call The [REDACTED]

Per patient request, surgical consultation has been arranged with
Dr. Saridis Rz at [REDACTED] on January 17, 2020.

Pathology results reported by Marbella Singleton RN on 01/07/2020.



Using sterile technique and 1% Lidocaine as local anesthetic, under
stereotactic guidance, a 9 gauge vacuum assisted device was used to
perform core needle biopsy of calcifications in the upper-outer left
breast using a lateral approach. Specimen radiograph was performed
showing calcifications in 3 core samples. Specimens with
calcifications are identified for pathology.

Lesion quadrant: Left upper outer

At the conclusion of the procedure, coil shaped tissue marker clip
was deployed into the biopsy cavity. Follow-up 2-view mammogram was
performed and dictated separately.
IMPRESSION: Stereotactic-guided biopsy of calcifications in the upper-outer left
breast. No apparent complications.

## 2021-05-05 ENCOUNTER — Other Ambulatory Visit: Payer: Self-pay | Admitting: "Endocrinology

## 2021-05-05 NOTE — Telephone Encounter (Signed)
Last OV 02/03/2021  Next OV 08/03/2021  Per last note in Spironolactone refill you stated: No further refills to be given. Patient must establish with new PCP.  Are both of these requests to be filled by her new PCP?

## 2021-05-17 ENCOUNTER — Other Ambulatory Visit: Payer: Self-pay | Admitting: "Endocrinology

## 2021-06-13 ENCOUNTER — Encounter: Payer: Self-pay | Admitting: "Endocrinology

## 2021-06-30 ENCOUNTER — Encounter (HOSPITAL_COMMUNITY): Payer: Self-pay

## 2021-07-28 ENCOUNTER — Other Ambulatory Visit (HOSPITAL_COMMUNITY)
Admission: RE | Admit: 2021-07-28 | Discharge: 2021-07-28 | Disposition: A | Discharge status: Home / Self Care | Payer: Medicare Other | Source: Ambulatory Visit | Attending: "Endocrinology | Admitting: "Endocrinology

## 2021-07-28 DIAGNOSIS — E890 Postprocedural hypothyroidism: Secondary | ICD-10-CM | POA: Diagnosis not present

## 2021-07-28 DIAGNOSIS — I10 Essential (primary) hypertension: Secondary | ICD-10-CM | POA: Diagnosis not present

## 2021-07-28 DIAGNOSIS — D3502 Benign neoplasm of left adrenal gland: Secondary | ICD-10-CM | POA: Insufficient documentation

## 2021-07-28 LAB — COMPREHENSIVE METABOLIC PANEL
ALT: 12 U/L (ref 0–44)
AST: 15 U/L (ref 15–41)
Albumin: 3.3 g/dL — ABNORMAL LOW (ref 3.5–5.0)
Alkaline Phosphatase: 111 U/L (ref 38–126)
Anion gap: 8 (ref 5–15)
BUN: 16 mg/dL (ref 8–23)
CO2: 29 mmol/L (ref 22–32)
Calcium: 9.1 mg/dL (ref 8.9–10.3)
Chloride: 103 mmol/L (ref 98–111)
Creatinine, Ser: 0.72 mg/dL (ref 0.44–1.00)
GFR, Estimated: >60 mL/min (ref >60)
Glucose, Bld: 115 mg/dL — ABNORMAL HIGH (ref 70–99)
Potassium: 3.9 mmol/L (ref 3.5–5.1)
Sodium: 140 mmol/L (ref 135–145)
Total Bilirubin: 0.3 mg/dL (ref 0.3–1.2)
Total Protein: 8.2 g/dL — ABNORMAL HIGH (ref 6.5–8.1)

## 2021-07-28 LAB — T4, FREE: Free T4: 1.35 ng/dL — ABNORMAL HIGH (ref 0.61–1.12)

## 2021-07-28 LAB — TSH: TSH: 0.069 u[IU]/mL — ABNORMAL LOW (ref 0.350–4.500)

## 2021-08-03 ENCOUNTER — Ambulatory Visit: Payer: Medicare Other | Admitting: "Endocrinology

## 2021-08-03 ENCOUNTER — Encounter: Payer: Self-pay | Admitting: "Endocrinology

## 2021-08-03 ENCOUNTER — Other Ambulatory Visit: Payer: Self-pay

## 2021-08-03 VITALS — BP 124/82 | HR 60 | Ht 63.0 in | Wt 233.4 lb

## 2021-08-03 DIAGNOSIS — D3502 Benign neoplasm of left adrenal gland: Secondary | ICD-10-CM

## 2021-08-03 DIAGNOSIS — E890 Postprocedural hypothyroidism: Secondary | ICD-10-CM

## 2021-08-03 DIAGNOSIS — I10 Essential (primary) hypertension: Secondary | ICD-10-CM | POA: Diagnosis not present

## 2021-08-03 DIAGNOSIS — E782 Mixed hyperlipidemia: Secondary | ICD-10-CM | POA: Diagnosis not present

## 2021-08-03 DIAGNOSIS — E119 Type 2 diabetes mellitus without complications: Secondary | ICD-10-CM

## 2021-08-03 LAB — POCT GLYCOSYLATED HEMOGLOBIN (HGB A1C): HbA1c, POC (controlled diabetic range): 6.1 % (ref 0.0–7.0)

## 2021-08-03 MED ORDER — LEVOTHYROXINE SODIUM 137 MCG PO TABS: 137.0000 ug | ORAL_TABLET | Freq: Every day | ORAL | 1 refills | Status: DC

## 2021-08-03 NOTE — Progress Notes (Signed)
? ?    ?                                           08/03/2021, 12:25 PM ? ?Endocrinology follow-up note ? ? ?Subjective:  ? ? [name], female    DOB: 1955/12/03, PCP Pcp, No ? ? ?Past Medical History:  ?Diagnosis Date  ? Adrenal mass, left ([place])   ? Degenerative disc disease at L5-S1 level   ? DM (diabetes mellitus) ([place])   ? Type II controlled  ? Enlarged heart   ? Family history of breast cancer   ? Family history of colon cancer   ? Family history of pancreatic cancer   ? Family history of stomach cancer   ? Grave's disease   ? HTN (hypertension)   ? Hyperlipidemia   ? Lymphocytosis   ? Nephrolithiasis   ? OSA on CPAP   ? ?Past Surgical History:  ?Procedure Laterality Date  ? ABDOMINAL HYSTERECTOMY    ? BREAST BIOPSY Left 01/06/2020  ? BREAST LUMPECTOMY WITH RADIOACTIVE SEED LOCALIZATION Left 02/19/2020  ? Procedure: LEFT BREAST LUMPECTOMY WITH RADIOACTIVE SEED LOCALIZATION;  Surgeon: [name], MD;  Location: [facility];  Service: General;  Laterality: Left;  ? COLONOSCOPY  2011  ? normal colonoscopy  ? COLONOSCOPY WITH PROPOFOL N/A 04/16/2018  ? Procedure: COLONOSCOPY WITH PROPOFOL;  Surgeon: Daneil Dolin, MD;  [facility];  Service: Endoscopy;  Laterality: N/A;  9:45am  ? HERNIA REPAIR  2011  ? LITHOTRIPSY    ? POLYPECTOMY  04/16/2018  ? Procedure: POLYPECTOMY;  Surgeon: Daneil Dolin, MD;  [facility];  Service: Endoscopy;;  (colon)  ? S/P Hysterectomy  10/1996  ? with bilat SOO seconardt to fibroids  ? thyroid ablation  1986  ? ?Social History  ? ?Socioeconomic History  ? Marital status: Married  ?  Spouse name: Not on file  ? Number of children: Not on file  ? Years of education: Not on file  ? Highest education level: Not on file  ?Occupational History  ? Not on file  ?Tobacco Use  ? Smoking status: Former  ?  Packs/day: 0.75  ?  Years: 10.00  ?  Pack years: 7.50  ?  Types: Cigarettes  ?  Start date: 01/30/1969  ?  Quit date: 05/23/1988   ?  Years since quitting: 33.2  ? Smokeless tobacco: Never  ?Vaping Use  ? Vaping Use: Never used  ?Substance and Sexual Activity  ? Alcohol use: No  ?  Alcohol/week: 0.0 standard drinks  ? Drug use: No  ? Sexual activity: Yes  ?Other Topics Concern  ? Not on file  ?Social History Narrative  ? Not on file  ? ?Social Determinants of Health  ? ?Financial Resource Strain: Low Risk   ? Difficulty of Paying Living Expenses: Not hard at all  ?Food Insecurity: No Food Insecurity  ? Worried About Charity fundraiser in the Last Year: Never true  ? Ran Out of Food in the Last Year: Never true  ?Transportation Needs: No Transportation Needs  ? Lack of Transportation (Medical): No  ? Lack of Transportation (Non-Medical): No  ?Physical Activity: Inactive  ? Days of Exercise per Week: 0 days  ? Minutes of Exercise per Session: 0 min  ?Stress: No Stress Concern Present  ? Feeling  of Stress : Not at all  ?Social Connections: Moderately Integrated  ? Frequency of Communication with Friends and Family: More than three times a week  ? Frequency of Social Gatherings with Friends and Family: More than three times a week  ? Attends Religious Services: More than 4 times per year  ? Active Member of Clubs or Organizations: No  ? Attends Archivist Meetings: Never  ? Marital Status: Married  ? ?Family History  ?Problem Relation Age of Onset  ? Colon cancer Mother   ?     d. 79  ? Cancer Mother   ?     metastisis sarcoma  ? Colon cancer Sister 69  ? Head & neck cancer Sister   ?     roof of mouth  ? Colon polyps Sister   ? Diabetes Father   ? Hypertension Father   ? Cancer Father   ? Lung cancer Father   ?     d. 34  ? Colon polyps Sister   ? Esophageal cancer Brother   ? Cancer Brother   ?     cancer of eye  ? Aneurysm Maternal Aunt   ?     brain  ? Cancer Maternal Uncle   ?     NOS  ? COPD Maternal Uncle   ? Stomach cancer Maternal Grandfather 53  ? Heart attack Maternal Grandfather   ? Pancreatic cancer Paternal Grandmother   ?      d. 1  ? Cancer Maternal Uncle   ?     NOS - mother's pat 1/2 brother  ? Breast cancer Cousin   ?     3 maternal cousins with breast cancer, some under 9, all different branches  ? Colon cancer Cousin   ?     five mat first cousins, 4 were siblings  ? Cancer Cousin   ?     2 mat 1/2 cousins with NOS cancer  ? ?Outpatient Encounter Medications as of 08/03/2021  ?Medication Sig  ? aspirin 81 MG EC tablet Take 81 mg by mouth as needed for pain. Swallow whole.  ? cloNIDine (CATAPRES) 0.2 MG tablet TAKE 1 TABLET(0.2 MG) BY MOUTH TWICE DAILY  ? spironolactone (ALDACTONE) 25 MG tablet TAKE 1 TABLET BY MOUTH EVERY MORNING  ? anastrozole (ARIMIDEX) 1 MG tablet Take 1 tablet (1 mg total) by mouth daily. Start 06/08/2020  ? atorvastatin (LIPITOR) 40 MG tablet TAKE 1 TABLET(40 MG) BY MOUTH DAILY  ? Blood Glucose Monitoring Suppl (BLOOD GLUCOSE MONITOR SYSTEM) W/DEVICE KIT Please dispense based on patient and insurance preference. Use to monitor fasting blood sugar 1x daily. Dx: E11.9  ? furosemide (LASIX) 20 MG tablet Take 1 tablet (20 mg total) by mouth daily as needed.  ? gabapentin (NEURONTIN) 300 MG capsule Take 1 capsule (300 mg total) by mouth at bedtime.  ? Glucose Blood (BLOOD GLUCOSE TEST STRIPS) STRP Please dispense based on patient and insurance preference. Use to monitor fasting blood sugar 1x daily. Dx: E11.9  ? Lancets 28G MISC Please dispense based on patient and insurance preference. Use to monitor fasting blood sugar 1x daily. Dx: E11.9  ? levothyroxine (SYNTHROID) 137 MCG tablet Take 1 tablet (137 mcg total) by mouth daily before breakfast.  ? losartan (COZAAR) 100 MG tablet TAKE 1 TABLET(100 MG) BY MOUTH DAILY  ? metFORMIN (GLUCOPHAGE) 500 MG tablet TAKE 1 TABLET BY MOUTH TWICE DAILY WITH A MEAL  ? potassium chloride SA (KLOR-CON M) 20 MEQ  tablet TAKE 1 TABLET BY MOUTH DAILY  ? [DISCONTINUED] acetaminophen (TYLENOL) 650 MG CR tablet Take 1,300 mg by mouth 2 (two) times daily as needed for pain. (Patient  not taking: Reported on 08/31/2020)  ? [DISCONTINUED] levothyroxine (SYNTHROID) 150 MCG tablet Take 1 tablet (150 mcg total) by mouth daily before breakfast.  ? ?No facility-administered encounter medications on file as of 08/03/2021.  ? ?ALLERGIES: ?No Known Allergies ? ?VACCINATION STATUS: ?Immunization History  ?Administered Date(s) Administered  ? Fluad Quad(high Dose 65+) 02/08/2019, 03/05/2020  ? Influenza-Unspecified 03/21/2011, 03/20/2015  ? Moderna Sars-Covid-2 Vaccination 07/08/2019, 07/31/2019  ? Pneumococcal Polysaccharide-23 04/13/2007, 03/05/2020  ? Tdap 09/14/2012  ? ? ?HPI ?Frances Hughes is 66 y.o. female who returns for follow-up with previsit labs.  She was initially seen in consultation for  left adrenal adenoma requested by Pcp, No. ?The left adrenal adenoma was found to be stable and nonfunctioning.   ? ?She has a long and complicated medical history.  See notes from previous visits.  ? ? History is obtained directly from the patient and review of her medical records.  she has history of hypertension on multiple medications.  She is currently doing much better on losartan, clonidine.she has not taken hydralazine in several weeks, neither did she take her spironolactone.  She was taken off of amlodipine during her last visit.   ?She presents with significant weight loss, feeling better, has more consistent energy. ? She also has history of Graves' disease which required radioactive iodine thyroid ablation in 2006.  She is currently on levothyroxine 150 mcg p.o. daily before breakfast.  Her previsit labs are consistent with slight over replacement.   ?She reports compliance and consistency.  . ? ?She is known to have left adrenal mass at least since September 2005.  She has a series of abdominal and thoracic imaging studies since then and it was documented that this lesion was stable and displays features consistent with benign adenoma.  During her last abdominal CT in 2016 it measured 3.5 cm  (3.2 cm in 2007) and the lesion demonstrated washout characteristics consistent with benign adrenal adenoma with absolute washout of 93%.  The right adrenal glands were normal. ?Over the years, she unde

## 2021-08-03 NOTE — Patient Instructions (Signed)

## 2021-08-12 ENCOUNTER — Other Ambulatory Visit: Payer: Self-pay | Admitting: "Endocrinology

## 2021-08-12 DIAGNOSIS — E782 Mixed hyperlipidemia: Secondary | ICD-10-CM

## 2021-08-29 ENCOUNTER — Other Ambulatory Visit: Payer: Self-pay | Admitting: "Endocrinology

## 2021-08-30 ENCOUNTER — Other Ambulatory Visit: Payer: Self-pay

## 2021-08-30 MED ORDER — LEVOTHYROXINE SODIUM 137 MCG PO TABS: 137.0000 ug | ORAL_TABLET | Freq: Every day | ORAL | 1 refills | Status: DC

## 2021-09-29 ENCOUNTER — Ambulatory Visit (INDEPENDENT_AMBULATORY_CARE_PROVIDER_SITE_OTHER): Payer: Medicare Other | Admitting: Internal Medicine

## 2021-09-29 ENCOUNTER — Encounter: Payer: Self-pay | Admitting: Internal Medicine

## 2021-09-29 VITALS — BP 122/82 | HR 64 | Resp 18 | Ht 62.0 in | Wt 239.0 lb

## 2021-09-29 DIAGNOSIS — B351 Tinea unguium: Secondary | ICD-10-CM | POA: Diagnosis not present

## 2021-09-29 DIAGNOSIS — D0512 Intraductal carcinoma in situ of left breast: Secondary | ICD-10-CM

## 2021-09-29 DIAGNOSIS — G4733 Obstructive sleep apnea (adult) (pediatric): Secondary | ICD-10-CM

## 2021-09-29 DIAGNOSIS — E890 Postprocedural hypothyroidism: Secondary | ICD-10-CM | POA: Diagnosis not present

## 2021-09-29 DIAGNOSIS — I10 Essential (primary) hypertension: Secondary | ICD-10-CM | POA: Diagnosis not present

## 2021-09-29 DIAGNOSIS — D3502 Benign neoplasm of left adrenal gland: Secondary | ICD-10-CM

## 2021-09-29 DIAGNOSIS — E1169 Type 2 diabetes mellitus with other specified complication: Secondary | ICD-10-CM

## 2021-09-29 DIAGNOSIS — Z23 Encounter for immunization: Secondary | ICD-10-CM | POA: Diagnosis not present

## 2021-09-29 MED ORDER — LOSARTAN POTASSIUM 100 MG PO TABS: 100.0000 mg | ORAL_TABLET | Freq: Every day | ORAL | 1 refills | Status: DC

## 2021-09-29 MED ORDER — TERBINAFINE HCL 250 MG PO TABS: 250.0000 mg | ORAL_TABLET | Freq: Every day | ORAL | 2 refills | Status: DC

## 2021-09-29 NOTE — Assessment & Plan Note (Signed)
S/p left lumpectomy and adjuvant radiation On Anastrazole Followed by Oncology 

## 2021-09-29 NOTE — Patient Instructions (Addendum)
Please stop taking Amlodipine. Continue taking other medications as prescribed. ? ?Please continue to follow low salt diet and perform moderate exercise/walking at least 150 mins/week. ? ?Please consider getting Shingrix vaccine at your local pharmacy. ?

## 2021-09-29 NOTE — Assessment & Plan Note (Signed)
Uses CPAP 

## 2021-09-29 NOTE — Assessment & Plan Note (Signed)
Nonfunctioning adrenal adenoma ?Followed by Endocrinology ?

## 2021-09-29 NOTE — Assessment & Plan Note (Signed)
Lab Results  ?Component Value Date  ? TSH 0.069 (L) 07/28/2021  ? ?On levothyroxine 137 mcg QD ?Followed by Endocrinology ?

## 2021-09-29 NOTE — Progress Notes (Addendum)
New Patient Office Visit  Subjective:  [name], female    DOB: [birth year]  Age: 66 y.o. MRN: [id number]  CC:  Chief Complaint  Patient presents with   New Patient (Initial Visit)    New patient was seeing dr Buelah Manis sees dr nida establishing care     HPI Frances Hughes is a 66 y.o. female with past medical history of type II DM, HTN, HLD, peripheral neuropathy, adrenal adenoma, breast cancer, hypothyroidism and morbid obesity who presents for establishing care.  HTN: BP is well-controlled. Takes medications regularly. Patient denies headache, dizziness, chest pain, dyspnea or palpitations.  She is on losartan 100 mg daily, spironolactone 25 mg daily, clonidine 0.2 mg twice daily and amlodipine 10 mg daily.  Of note, she follows up with Dr. Dorris Fetch for h/o adrenal adenoma and uncontrolled HTN.  It appears that she was supposed to stop taking amlodipine, but she has continued to take it.  Type II DM: She takes metformin 500 mg twice daily.  She denies any polyuria or polydipsia currently.  Her last HbA1C was 6.1.  Hypothyroidism: She is on levothyroxine 137 mcg QD. Her last TSH was low and dose was adjusted by Dr Dorris Fetch. She feels mild fatigue since lowering the dose. Denies any change in weight or appetite.  She has history of breast cancer, s/p left lumpectomy and adjuvant radiation. She is on Anastrazole currently. She follows up with Oncology.  She received PCV 20 in the office today.      Past Medical History:  Diagnosis Date   Adrenal mass, left ([place])    Degenerative disc disease at L5-S1 level    Depression 07/15/2011   DM (diabetes mellitus) ([place])    Type II controlled   Enlarged heart    Family history of breast cancer    Family history of colon cancer    Family history of pancreatic cancer    Family history of stomach cancer    Grave's disease    HTN (hypertension)    Hyperlipidemia    Lymphocytosis    Nephrolithiasis    OSA on CPAP     Past  Surgical History:  Procedure Laterality Date   ABDOMINAL HYSTERECTOMY     BREAST BIOPSY Left 01/06/2020   BREAST LUMPECTOMY WITH RADIOACTIVE SEED LOCALIZATION Left 02/19/2020   Procedure: LEFT BREAST LUMPECTOMY WITH RADIOACTIVE SEED LOCALIZATION;  Surgeon: [name], MD;  Location: [place] [facility];  Service: General;  Laterality: Left;   COLONOSCOPY  2011   normal colonoscopy   COLONOSCOPY WITH PROPOFOL N/A 04/16/2018   Procedure: COLONOSCOPY WITH PROPOFOL;  Surgeon: Daneil Dolin, MD;  [facility];  Service: Endoscopy;  Laterality: N/A;  9:45am   HERNIA REPAIR  2011   LITHOTRIPSY     POLYPECTOMY  04/16/2018   Procedure: POLYPECTOMY;  Surgeon: Daneil Dolin, MD;  [facility];  Service: Endoscopy;;  (colon)   S/P Hysterectomy  10/1996   with bilat SOO seconardt to fibroids   thyroid ablation  1986    Family History  Problem Relation Age of Onset   Colon cancer Mother        d. 59   Cancer Mother        metastisis sarcoma   Colon cancer Sister 84   Head & neck cancer Sister        roof of mouth   Colon polyps Sister    Diabetes Father    Hypertension Father  Cancer Father    Lung cancer Father        d. 31   Colon polyps Sister    Esophageal cancer Brother    Cancer Brother        cancer of eye   Aneurysm Maternal Aunt        brain   Cancer Maternal Uncle        NOS   COPD Maternal Uncle    Stomach cancer Maternal Grandfather 76   Heart attack Maternal Grandfather    Pancreatic cancer Paternal Grandmother        d. 17   Cancer Maternal Uncle        NOS - mother's pat 1/2 brother   Breast cancer Cousin        3 maternal cousins with breast cancer, some under 3, all different branches   Colon cancer Cousin        five mat first cousins, 78 were siblings   Cancer Cousin        2 mat 1/2 cousins with NOS cancer    Social History   Socioeconomic History   Marital status: Married    Spouse name: Not on file   Number  of children: Not on file   Years of education: Not on file   Highest education level: Not on file  Occupational History   Not on file  Tobacco Use   Smoking status: Former    Packs/day: 0.75    Years: 10.00    Pack years: 7.50    Types: Cigarettes    Start date: 01/30/1969    Quit date: 05/23/1988    Years since quitting: 33.3   Smokeless tobacco: Never  Vaping Use   Vaping Use: Never used  Substance and Sexual Activity   Alcohol use: No    Alcohol/week: 0.0 standard drinks   Drug use: No   Sexual activity: Yes  Other Topics Concern   Not on file  Social History Narrative   Not on file   Social Determinants of Health   Financial Resource Strain: Not on file  Food Insecurity: Not on file  Transportation Needs: Not on file  Physical Activity: Not on file  Stress: Not on file  Social Connections: Not on file  Intimate Partner Violence: Not on file    ROS Review of Systems  Constitutional:  Negative for chills and fever.  HENT:  Negative for congestion, sinus pressure, sinus pain and sore throat.   Eyes:  Negative for pain and discharge.  Respiratory:  Negative for cough and shortness of breath.   Cardiovascular:  Negative for chest pain and palpitations.  Gastrointestinal:  Negative for abdominal pain, constipation, diarrhea, nausea and vomiting.  Endocrine: Negative for polydipsia and polyuria.  Genitourinary:  Negative for dysuria and hematuria.  Musculoskeletal:  Negative for neck pain and neck stiffness.  Skin:  Negative for rash.  Neurological:  Negative for dizziness and weakness.  Psychiatric/Behavioral:  Negative for agitation and behavioral problems.    Objective:   Today's Vitals: BP 122/82 (BP Location: Left Arm, Patient Position: Sitting, Cuff Size: Normal)   Pulse 64   Resp 18   Ht _0  (1.575 m)   Wt 239 lb (108.4 kg)   SpO2 99%   BMI 43.71 kg/m   Physical Exam Vitals reviewed.  Constitutional:      General: She is not in acute distress.     Appearance: She is obese. She is not diaphoretic.  HENT:  Head: Normocephalic and atraumatic.     Nose: Nose normal.     Mouth/Throat:     Mouth: Mucous membranes are moist.  Eyes:     General: No scleral icterus.    Extraocular Movements: Extraocular movements intact.  Cardiovascular:     Rate and Rhythm: Normal rate and regular rhythm.     Pulses: Normal pulses.     Heart sounds: Normal heart sounds. No murmur heard. Pulmonary:     Breath sounds: Normal breath sounds. No wheezing or rales.  Abdominal:     Palpations: Abdomen is soft.     Tenderness: There is no abdominal tenderness.  Musculoskeletal:     Cervical back: Neck supple. No tenderness.     Right lower leg: No edema.     Left lower leg: No edema.  Feet:     Right foot:     Toenail Condition: Right toenails are abnormally thick. Fungal disease present.    Left foot:     Toenail Condition: Left toenails are abnormally thick. Fungal disease present. Skin:    General: Skin is warm.     Findings: No rash.  Neurological:     General: No focal deficit present.     Mental Status: She is alert and oriented to person, place, and time.  Psychiatric:        Mood and Affect: Mood normal.        Behavior: Behavior normal.    Assessment & Plan:   Problem List Items Addressed This Visit       Cardiovascular and Mediastinum   Essential hypertension, benign    BP Readings from Last 1 Encounters:  09/29/21 122/82  Well-controlled with losartan 100 mg daily, spironolactone 25 mg daily and clonidine 0.2 mg twice daily Discontinue amlodipine as her BP is well controlled currently and plan was to decrease her medication burden -we will add amlodipine 5 mg if needed Counseled for compliance with the medications Advised DASH diet and moderate exercise/walking, at least 150 mins/week       Relevant Medications   losartan (COZAAR) 100 MG tablet     Respiratory   OSA (obstructive sleep apnea)    Uses CPAP          Endocrine   Type 2 diabetes mellitus with other specified complication (HCC) - Primary    Lab Results  Component Value Date   HGBA1C 6.1 08/03/2021   On metformin 500 mg twice daily Advised to follow diabetic diet On statin and ARB F/u CMP and lipid panel Diabetic foot exam: Today Diabetic eye exam: Advised to follow up with Ophthalmology for diabetic eye exam       Relevant Medications   losartan (COZAAR) 100 MG tablet   Other Relevant Orders   Microalbumin / creatinine urine ratio   Adrenal adenoma, left    Nonfunctioning adrenal adenoma Followed by Endocrinology       Hypothyroidism following radioiodine therapy    Lab Results  Component Value Date   TSH 0.069 (L) 07/28/2021  On levothyroxine 137 mcg QD Followed by Endocrinology        Musculoskeletal and Integument   Onychomycosis    Started Terbinafine Has tried topical treatment, but did not improve Has seen Podiatry       Relevant Medications   terbinafine (LAMISIL) 250 MG tablet     Other   Morbid obesity (HCC)    Diet modification and moderate exercise/walking advised       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 Start 06/08/2020   aspirin 81 MG EC tablet Take 81 mg by mouth as needed for pain. Swallow whole.   atorvastatin (LIPITOR) 40 MG tablet TAKE 1 TABLET(40 MG) BY MOUTH DAILY   Blood Glucose Monitoring Suppl (BLOOD GLUCOSE MONITOR SYSTEM) W/DEVICE KIT Please dispense based on patient and insurance preference.  Use to monitor fasting blood sugar 1x daily. Dx: E11.9   cloNIDine (CATAPRES) 0.2 MG tablet TAKE 1 TABLET(0.2 MG) BY MOUTH TWICE DAILY   furosemide (LASIX) 20 MG tablet Take 1 tablet (20 mg total) by mouth daily as needed.   gabapentin (NEURONTIN) 300 MG capsule Take 1 capsule (300 mg total) by mouth at bedtime.   Glucose Blood (BLOOD GLUCOSE TEST STRIPS) STRP Please dispense based on patient and insurance preference. Use to monitor fasting blood sugar 1x daily. Dx: E11.9   Lancets 28G MISC Please dispense based on patient and insurance preference. Use to monitor fasting blood sugar 1x daily. Dx: E11.9   levothyroxine (SYNTHROID) 137 MCG tablet Take 1 tablet (137 mcg total) by mouth daily before breakfast.   metFORMIN (GLUCOPHAGE) 500 MG tablet TAKE 1 TABLET BY MOUTH TWICE DAILY WITH A MEAL   potassium chloride SA (KLOR-CON M) 20 MEQ tablet TAKE 1 TABLET BY MOUTH DAILY   spironolactone (ALDACTONE) 25 MG tablet TAKE 1 TABLET BY MOUTH EVERY MORNING   terbinafine (LAMISIL) 250 MG tablet Take 1 tablet (250 mg total) by mouth daily.   [DISCONTINUED] losartan (COZAAR) 100 MG tablet TAKE 1 TABLET(100 MG) BY MOUTH DAILY   losartan (COZAAR) 100 MG tablet Take 1 tablet (100 mg total) by mouth daily.   No facility-administered encounter medications on file as of 09/29/2021.    Follow-up: Return in about 4 months (around 01/30/2022) for Annual physical.   Lindell Spar, MD

## 2021-09-29 NOTE — Assessment & Plan Note (Addendum)
Started Terbinafine ?Has tried topical treatment, but did not improve ?Has seen Podiatry ?

## 2021-09-29 NOTE — Assessment & Plan Note (Signed)
BP Readings from Last 1 Encounters:  ?09/29/21 122/82  ? ?Well-controlled with losartan 100 mg daily, spironolactone 25 mg daily and clonidine 0.2 mg twice daily ?Discontinue amlodipine as her BP is well controlled currently and plan was to decrease her medication burden -we will add amlodipine 5 mg if needed ?Counseled for compliance with the medications ?Advised DASH diet and moderate exercise/walking, at least 150 mins/week ? ?

## 2021-09-29 NOTE — Assessment & Plan Note (Signed)
Lab Results  ?Component Value Date  ? HGBA1C 6.1 08/03/2021  ? ? ?On metformin 500 mg twice daily ?Advised to follow diabetic diet ?On statin and ARB ?F/u CMP and lipid panel ?Diabetic foot exam: Today ?Diabetic eye exam: Advised to follow up with Ophthalmology for diabetic eye exam ? ?

## 2021-09-29 NOTE — Assessment & Plan Note (Signed)
Diet modification and moderate exercise/walking advised 

## 2021-10-01 LAB — MICROALBUMIN / CREATININE URINE RATIO
Creatinine, Urine: 26.2 mg/dL
Microalb/Creat Ratio: <11 mg/g creat (ref 0–29)
Microalbumin, Urine: <3 ug/mL

## 2021-11-02 LAB — T4, FREE: Free T4: 1.17 ng/dL (ref 0.82–1.77)

## 2021-11-02 LAB — TSH: TSH: 1 u[IU]/mL (ref 0.450–4.500)

## 2021-11-02 LAB — METANEPHRINES, PLASMA
Metanephrine, Free: 21.1 pg/mL (ref 0.0–88.0)
Normetanephrine, Free: 67.2 pg/mL (ref 0.0–285.2)

## 2021-11-03 ENCOUNTER — Encounter: Payer: Self-pay | Admitting: "Endocrinology

## 2021-11-03 ENCOUNTER — Ambulatory Visit: Payer: Medicare Other | Admitting: "Endocrinology

## 2021-11-03 VITALS — BP 124/82 | HR 72 | Ht 62.0 in | Wt 243.6 lb

## 2021-11-03 DIAGNOSIS — E119 Type 2 diabetes mellitus without complications: Secondary | ICD-10-CM | POA: Diagnosis not present

## 2021-11-03 DIAGNOSIS — I10 Essential (primary) hypertension: Secondary | ICD-10-CM | POA: Diagnosis not present

## 2021-11-03 DIAGNOSIS — E782 Mixed hyperlipidemia: Secondary | ICD-10-CM

## 2021-11-03 DIAGNOSIS — E890 Postprocedural hypothyroidism: Secondary | ICD-10-CM

## 2021-11-03 DIAGNOSIS — D3502 Benign neoplasm of left adrenal gland: Secondary | ICD-10-CM

## 2021-11-03 LAB — POCT GLYCOSYLATED HEMOGLOBIN (HGB A1C): HbA1c, POC (controlled diabetic range): 6 % (ref 0.0–7.0)

## 2021-11-03 MED ORDER — LEVOTHYROXINE SODIUM 125 MCG PO TABS: 125.0000 ug | ORAL_TABLET | Freq: Every day | ORAL | 1 refills | Status: DC

## 2021-11-03 NOTE — Patient Instructions (Signed)
                                     Advice for Weight Management  -For most of us the best way to lose weight is by diet management. Generally speaking, diet management means consuming less calories intentionally which over time brings about progressive weight loss.  This can be achieved more effectively by avoiding ultra processed carbohydrates, processed meats, unhealthy fats.    It is critically important to know your numbers: how much calorie you are consuming and how much calorie you need. More importantly, our carbohydrates sources should be unprocessed naturally occurring  complex starch food items.  It is always important to balance nutrition also by  appropriate intake of proteins (mainly plant-based), healthy fats/oils, plenty of fruits and vegetables.   -The American College of Lifestyle Medicine (ACL M) recommends nutrition derived mostly from Whole Food, Plant Predominant Sources example an apple instead of applesauce or apple pie. Eat Plenty of vegetables, Mushrooms, fruits, Legumes, Whole Grains, Nuts, seeds in lieu of processed meats, processed snacks/pastries red meat, poultry, eggs.  Use only water or unsweetened tea for hydration.  The College also recommends the need to stay away from risky substances including alcohol, smoking; obtaining 7-9 hours of restorative sleep, at least 150 minutes of moderate intensity exercise weekly, importance of healthy social connections, and being mindful of stress and seek help when it is overwhelming.    -Sticking to a routine mealtime to eat 3 meals a day and avoiding unnecessary snacks is shown to have a big role in weight control. Under normal circumstances, the only time we burn stored energy is when we are hungry, so allow  some hunger to take place- hunger means no food between appropriate meal times, only water.  It is not advisable to starve.   -It is better to avoid simple carbohydrates including:  Cakes, Sweet Desserts, Ice Cream, Soda (diet and regular), Sweet Tea, Candies, Chips, Cookies, Store Bought Juices, Alcohol in Excess of  1-2 drinks a day, Lemonade,  Artificial Sweeteners, Doughnuts, Coffee Creamers, "Sugar-free" Products, etc, etc.  This is not a complete list.....    -Consulting with certified diabetes educators is proven to provide you with the most accurate and current information on diet.  Also, you may be  interested in discussing diet options/exchanges , we can schedule a visit with Penny Crumpton, RDN, CDE for individualized nutrition education.  -Exercise: If you are able: 30 -60 minutes a day ,4 days a week, or 150 minutes of moderate intensity exercise weekly.    The longer the better if tolerated.  Combine stretch, strength, and aerobic activities.  If you were told in the past that you have high risk for cardiovascular diseases, or if you are currently symptomatic, you may seek evaluation by your heart doctor prior to initiating moderate to intense exercise programs.                                  Additional Care Considerations for Diabetes   -Diabetes  is a chronic disease.  The most important care consideration is regular follow-up with your diabetes care provider with the goal being avoiding or delaying its complications and to take advantage of advances in medications and technology.  If appropriate actions are taken early enough, type 2 diabetes can even be   reversed.  Seek information from the right source.  - Whole Food, Plant Predominant Nutrition is highly recommended: Eat Plenty of vegetables, Mushrooms, fruits, Legumes, Whole Grains, Nuts, seeds in lieu of processed meats, processed snacks/pastries red meat, poultry, eggs as recommended by American College of  Lifestyle Medicine (ACLM).  -Type 2 diabetes is known to coexist with other important comorbidities such as high blood pressure and high cholesterol.  It is critical to control not only the diabetes but  also the high blood pressure and high cholesterol to minimize and delay the risk of complications including coronary artery disease, stroke, amputations, blindness, etc.  The good news is that this diet recommendation for type 2 diabetes is also very helpful for managing high cholesterol and high blood blood pressure.  - Studies showed that people with diabetes will benefit from a class of medications known as ACE inhibitors and statins.  Unless there are specific reasons not to be on these medications, the standard of care is to consider getting one from these groups of medications at an optimal doses.  These medications are generally considered safe and proven to help protect the heart and the kidneys.    - People with diabetes are encouraged to initiate and maintain regular follow-up with eye doctors, foot doctors, dentists , and if necessary heart and kidney doctors.     - It is highly recommended that people with diabetes quit smoking or stay away from smoking, and get yearly  flu vaccine and pneumonia vaccine at least every 5 years.  See above for additional recommendations on exercise, sleep, stress management , and healthy social connections.      

## 2021-11-03 NOTE — Progress Notes (Signed)
11/03/2021, 1:08 PM  Endocrinology follow-up note   Subjective:    [name], female    DOB: 12-08-1955, PCP Lindell Spar, MD   Past Medical History:  Diagnosis Date   Adrenal mass, left ([place])    Degenerative disc disease at L5-S1 level    Depression 07/15/2011   DM (diabetes mellitus) ([place])    Type II controlled   Enlarged heart    Family history of breast cancer    Family history of colon cancer    Family history of pancreatic cancer    Family history of stomach cancer    Grave's disease    HTN (hypertension)    Hyperlipidemia    Lymphocytosis    Nephrolithiasis    OSA on CPAP    Past Surgical History:  Procedure Laterality Date   ABDOMINAL HYSTERECTOMY     BREAST BIOPSY Left 01/06/2020   BREAST LUMPECTOMY WITH RADIOACTIVE SEED LOCALIZATION Left 02/19/2020   Procedure: LEFT BREAST LUMPECTOMY WITH RADIOACTIVE SEED LOCALIZATION;  Surgeon: [name], MD;  Location: [facility];  Service: General;  Laterality: Left;   COLONOSCOPY  2011   normal colonoscopy   COLONOSCOPY WITH PROPOFOL N/A 04/16/2018   Procedure: COLONOSCOPY WITH PROPOFOL;  Surgeon: Daneil Dolin, MD;  [facility];  Service: Endoscopy;  Laterality: N/A;  9:45am   HERNIA REPAIR  2011   LITHOTRIPSY     POLYPECTOMY  04/16/2018   Procedure: POLYPECTOMY;  Surgeon: Daneil Dolin, MD;  [facility];  Service: Endoscopy;;  (colon)   S/P Hysterectomy  10/1996   with bilat SOO seconardt to fibroids   thyroid ablation  1986   Social History   Socioeconomic History   Marital status: Married    Spouse name: Not on file   Number of children: Not on file   Years of education: Not on file   Highest education level: Not on file  Occupational History   Not on file  Tobacco Use   Smoking status: Former    Packs/day: 0.75    Years: 10.00    Total pack years: 7.50    Types: Cigarettes     Start date: 01/30/1969    Quit date: 05/23/1988    Years since quitting: 33.4   Smokeless tobacco: Never  Vaping Use   Vaping Use: Never used  Substance and Sexual Activity   Alcohol use: No    Alcohol/week: 0.0 standard drinks of alcohol   Drug use: No   Sexual activity: Yes  Other Topics Concern   Not on file  Social History Narrative   Not on file   Social Determinants of Health   Financial Resource Strain: Low Risk  (08/31/2020)   Overall Financial Resource Strain (CARDIA)    Difficulty of Paying Living Expenses: Not hard at all  Food Insecurity: No Food Insecurity (08/31/2020)   Hunger Vital Sign    Worried About Running Out of Food in the Last Year: Never true    Ran Out of Food in the Last Year: Never true  Transportation Needs: No Transportation Needs (08/31/2020)   PRAPARE - Transportation    Lack of Transportation (Medical): No    Lack of  Transportation (Non-Medical): No  Physical Activity: Inactive (08/31/2020)   Exercise Vital Sign    Days of Exercise per Week: 0 days    Minutes of Exercise per Session: 0 min  Stress: No Stress Concern Present (08/31/2020)   [place]    Feeling of Stress : Not at all  Social Connections: Moderately Integrated (08/31/2020)   Social Connection and Isolation Panel [NHANES]    Frequency of Communication with Friends and Family: More than three times a week    Frequency of Social Gatherings with Friends and Family: More than three times a week    Attends Religious Services: More than 4 times per year    Active Member of [employer] or Organizations: No    Attends Archivist Meetings: Never    Marital Status: Married   Family History  Problem Relation Age of Onset   Colon cancer Mother        d. 40   Cancer Mother        metastisis sarcoma   Colon cancer Sister 19   Head & neck cancer Sister        roof of mouth   Colon polyps Sister    Diabetes Father     Hypertension Father    Cancer Father    Lung cancer Father        d. 8   Colon polyps Sister    Esophageal cancer Brother    Cancer Brother        cancer of eye   Aneurysm Maternal Aunt        brain   Cancer Maternal Uncle        NOS   COPD Maternal Uncle    Stomach cancer Maternal Grandfather 76   Heart attack Maternal Grandfather    Pancreatic cancer Paternal Grandmother        d. 17   Cancer Maternal Uncle        NOS - mother's pat 1/2 brother   Breast cancer Cousin        3 maternal cousins with breast cancer, some under 46, all different branches   Colon cancer Cousin        five mat first cousins, 20 were siblings   Cancer Cousin        2 mat 1/2 cousins with NOS cancer   Outpatient Encounter Medications as of 11/03/2021  Medication Sig   anastrozole (ARIMIDEX) 1 MG tablet Take 1 tablet (1 mg total) by mouth daily. Start 06/08/2020   aspirin 81 MG EC tablet Take 81 mg by mouth as needed for pain. Swallow whole.   atorvastatin (LIPITOR) 40 MG tablet TAKE 1 TABLET(40 MG) BY MOUTH DAILY   Blood Glucose Monitoring Suppl (BLOOD GLUCOSE MONITOR SYSTEM) W/DEVICE KIT Please dispense based on patient and insurance preference. Use to monitor fasting blood sugar 1x daily. Dx: E11.9   cloNIDine (CATAPRES) 0.2 MG tablet TAKE 1 TABLET(0.2 MG) BY MOUTH TWICE DAILY   furosemide (LASIX) 20 MG tablet Take 1 tablet (20 mg total) by mouth daily as needed.   gabapentin (NEURONTIN) 300 MG capsule Take 1 capsule (300 mg total) by mouth at bedtime. (Patient taking differently: Take 300 mg by mouth. Take one daily qhs prn)   Glucose Blood (BLOOD GLUCOSE TEST STRIPS) STRP Please dispense based on patient and insurance preference. Use to monitor fasting blood sugar 1x daily. Dx: E11.9   Lancets 28G MISC Please dispense based on patient and insurance  preference. Use to monitor fasting blood sugar 1x daily. Dx: E11.9   levothyroxine (SYNTHROID) 125 MCG tablet Take 1 tablet (125 mcg total) by mouth daily  before breakfast.   losartan (COZAAR) 100 MG tablet Take 1 tablet (100 mg total) by mouth daily.   metFORMIN (GLUCOPHAGE) 500 MG tablet TAKE 1 TABLET BY MOUTH TWICE DAILY WITH A MEAL   potassium chloride SA (KLOR-CON M) 20 MEQ tablet TAKE 1 TABLET BY MOUTH DAILY (Patient taking differently: daily as needed.)   spironolactone (ALDACTONE) 25 MG tablet TAKE 1 TABLET BY MOUTH EVERY MORNING   terbinafine (LAMISIL) 250 MG tablet Take 1 tablet (250 mg total) by mouth daily.   [DISCONTINUED] levothyroxine (SYNTHROID) 137 MCG tablet Take 1 tablet (137 mcg total) by mouth daily before breakfast.   No facility-administered encounter medications on file as of 11/03/2021.   ALLERGIES: No Known Allergies  VACCINATION STATUS: Immunization History  Administered Date(s) Administered   Fluad Quad(high Dose 65+) 02/08/2019, 03/05/2020   Influenza-Unspecified 03/21/2011, 03/20/2015   Moderna Sars-Covid-2 Vaccination 07/08/2019, 07/31/2019   PNEUMOCOCCAL CONJUGATE-20 09/29/2021   Pneumococcal Polysaccharide-23 04/13/2007, 03/05/2020   Tdap 09/14/2012    HPI Frances Hughes is 66 y.o. female who returns for follow-up with previsit labs.  She was initially seen in consultation for  left adrenal adenoma requested by Lindell Spar, MD. The left adrenal adenoma was found to be stable and nonfunctioning.    She has a long and complicated medical history.  See notes from previous visits.    History is obtained directly from the patient and review of her medical records.  she has history of hypertension on multiple medications.  She is currently doing much better on losartan, clonidine.she has not taken hydralazine in several weeks, neither did she take her spironolactone.  She was taken off of amlodipine during her last visit.     She also has history of Graves' disease which required radioactive iodine thyroid ablation in 2006.  She is currently on levothyroxine 137 mcg  6 days a week.   She reports  compliance and consistency.  . She presents with significant weight gain.  However she feels better, has more consistent energy.   She is known to have left adrenal mass at least since September 2005.  She has a series of abdominal and thoracic imaging studies since then and it was documented that this lesion was stable and displays features consistent with benign adenoma.  During her last abdominal CT in 2016 it measured 3.5 cm (3.2 cm in 2007) and the lesion demonstrated washout characteristics consistent with benign adrenal adenoma with absolute washout of 93%.  The right adrenal glands were normal. Over the years, she underwent various endocrine work-up for this adenoma and none of them were significant for functioning lesion. Most recently in October 2021 she underwent 24-hour urine collection for metanephrines which were normal.  Her free plasma metanephrines before this visit were normal.   along with her hypertension, patient has had chronic hypokalemia which required intermittent supplement with potassium.  She is currently on lower dose potassium 20 mEq daily. She is not on regular diuresis with Lasix anymore, she uses Lasix 20 mg p.o. as needed.  She has well-controlled type 2 diabetes with point-of-care A1c today of 6%.  She is currently on metformin 500 mg p.o. twice daily.     She is recently diagnosed with breast cancer s/p chemo/surgery.      She has sleep apnea on CPAP machine. She does not  consume licorice, no family history of premature coronary artery disease, CVA, adrenal, thyroid, parathyroid, pituitary dysfunctions.  Review of Systems  Minimally fluctuating body weight.  Objective:       11/03/2021   10:30 AM 09/29/2021   10:50 AM 08/03/2021   10:04 AM  Vitals with BMI  Height '5\' 2"'  '5\' 2"'  '5\' 3"'   Weight 243 lbs 10 oz 239 lbs 233 lbs 6 oz  BMI 44.54 09.2 33.00  Systolic 762 263 335  Diastolic 82 82 82  Pulse 72 64 60    BP 124/82   Pulse 72   Ht '5\' 2"'  (1.575  m)   Wt 243 lb 9.6 oz (110.5 kg)   BMI 44.56 kg/m   Wt Readings from Last 3 Encounters:  11/03/21 243 lb 9.6 oz (110.5 kg)  09/29/21 239 lb (108.4 kg)  08/03/21 233 lb 6.4 oz (105.9 kg)    Physical Exam She has bilateral peripheral edema.  CMP ( most recent) CMP     Component Value Date/Time   NA 140 07/28/2021 1115   K 3.9 07/28/2021 1115   CL 103 07/28/2021 1115   CO2 29 07/28/2021 1115   GLUCOSE 115 (H) 07/28/2021 1115   BUN 16 07/28/2021 1115   CREATININE 0.72 07/28/2021 1115   CREATININE 0.68 06/12/2020 0930   CALCIUM 9.1 07/28/2021 1115   PROT 8.2 (H) 07/28/2021 1115   ALBUMIN 3.3 (L) 07/28/2021 1115   AST 15 07/28/2021 1115   AST 17 03/02/2020 1526   ALT 12 07/28/2021 1115   ALT 13 03/02/2020 1526   ALKPHOS 111 07/28/2021 1115   BILITOT 0.3 07/28/2021 1115   BILITOT 0.3 03/02/2020 1526   GFRNONAA >60 07/28/2021 1115   GFRNONAA >60 03/02/2020 1526   GFRNONAA 94 11/07/2018 0927   GFRAA >60 02/17/2020 0930   GFRAA 109 11/07/2018 0927     Diabetic Labs (most recent): Lab Results  Component Value Date   HGBA1C 6.0 11/03/2021   HGBA1C 6.1 08/03/2021   HGBA1C 5.7 (H) 06/12/2020   MICROALBUR 5.3 09/09/2019   MICROALBUR 2.2 11/07/2018   MICROALBUR 0.3 04/03/2015     Lipid Panel ( most recent) Lipid Panel     Component Value Date/Time   CHOL 180 06/12/2020 0930   TRIG 77 06/12/2020 0930   HDL 56 06/12/2020 0930   CHOLHDL 3.2 06/12/2020 0930   VLDL 21 09/09/2016 1311   LDLCALC 107 (H) 06/12/2020 0930   LDLDIRECT 110 (H) 01/21/2012 0950     Recent Results (from the past 2160 hour(s))  Microalbumin / creatinine urine ratio     Status: None   Collection Time: 09/29/21 11:48 AM  Result Value Ref Range   Creatinine, Urine 26.2 Not Estab. mg/dL   Microalbumin, Urine <3.0 Not Estab. ug/mL    Comment: **Verified by repeat analysis**   Microalb/Creat Ratio <11 0 - 29 mg/g creat    Comment:                        Normal:                0 -  29                         Moderately increased: 30 - 300                        Severely increased:       >  300   TSH     Status: None   Collection Time: 10/27/21  1:01 PM  Result Value Ref Range   TSH 1.000 0.450 - 4.500 uIU/mL  T4, free     Status: None   Collection Time: 10/27/21  1:01 PM  Result Value Ref Range   Free T4 1.17 0.82 - 1.77 ng/dL  Metanephrines, plasma     Status: None   Collection Time: 10/27/21  1:01 PM  Result Value Ref Range   Normetanephrine, Free 67.2 0.0 - 285.2 pg/mL   Metanephrine, Free 21.1 0.0 - 88.0 pg/mL  HgB A1c     Status: None   Collection Time: 11/03/21 10:41 AM  Result Value Ref Range   Hemoglobin A1C     HbA1c POC (<> result, manual entry)     HbA1c, POC (prediabetic range)     HbA1c, POC (controlled diabetic range) 6.0 0.0 - 7.0 %     Assessment & Plan:   1. Adrenal adenoma- left -Her plasma free metanephrines were normal.  Review of all of her previous work-up points to a stable nonfunctioning left adrenal adenoma.  This adenoma is not contributing to her history of hypertension.    This lesion was documented to be stable since 2005, will not need surgical intervention at this time.   Her mild hypokalemia seems to be diuretic related, responding to low-dose potassium supplement.    She does not have paroxysmal symptoms to suggest pheochromocytoma.  Her recent work-up rules out  Cushing syndrome. Her recent work-up with aldosterone, plasma renin activity are not conclusive for primary hyperaldosteronism.  She will not need surgical treatment for the adenoma.  She will be considered for plasma metanephrines on subsequent visits. 2.  Essential hypertension  She is responding to a lesser number of antihypertensive medications.  She is advised to continue clonidine 0.2 mg p.o. twice daily, losartan 100 mg p.o. daily, spironolactone 25 mg p.o. daily.    Her blood pressure is controlled to target.  3.  RAI induced hypothyroidism  Regarding her RAI induced  hypothyroidism: Her previsit thyroid function tests are still consistent with appropriate replacement.  She would benefit from daily dose of levothyroxine.  I discussed and lowered her levothyroxine to 125 mcg p.o. daily before breakfast.     - We discussed about the correct intake of her thyroid hormone, on empty stomach at fasting, with water, separated by at least 30 minutes from breakfast and other medications,  and separated by more than 4 hours from calcium, iron, multivitamins, acid reflux medications (PPIs). -Patient is made aware of the fact that thyroid hormone replacement is needed for life, dose to be adjusted by periodic monitoring of thyroid function tests.    4.  Type 2 diabetes: Controlled with A1c 6%.   she is advised to continue metformin 500 mg p.o. twice daily. In light of her metabolic syndrome with type 2 diabetes, hypertension, hyperlipidemia obesity this patient will benefit from lifestyle medicine  - she acknowledges that there is a room for improvement in her food and drink choices. - Suggestion is made for her to avoid simple carbohydrates  from her diet including Cakes, Sweet Desserts, Ice Cream, Soda (diet and regular), Sweet Tea, Candies, Chips, Cookies, Store Bought Juices, Alcohol , Artificial Sweeteners,  Coffee Creamer, and "Sugar-free" Products, Lemonade. This will help patient to have more stable blood glucose profile and potentially avoid unintended weight gain.  The following Lifestyle Medicine recommendations according to [facility] Association of Lifestyle Medicine  (  ACLM) were discussed and and offered to patient and she  agrees to start the journey:  A. Whole Foods, Plant-Based Nutrition comprising of fruits and vegetables, plant-based proteins, whole-grain carbohydrates was discussed in detail with the patient.   A list for source of those nutrients were also provided to the patient.  Patient will use only water or unsweetened tea for hydration. B.  The need to  stay away from risky substances including alcohol, smoking; obtaining 7 to 9 hours of restorative sleep, at least 150 minutes of moderate intensity exercise weekly, the importance of healthy social connections,  and stress management techniques were discussed. C.  A full color page of  Calorie density of various food groups per pound showing examples of each food groups was provided to the patient.   - she is advised to seek and establish a primary care provider in town, Dr. Posey Pronto was suggested for her primary care needs.   I spent 41 minutes in the care of the patient today including review of labs from [place], Lipids, Thyroid Function, Hematology (current and previous including abstractions from other facilities); face-to-face time discussing  her blood glucose readings/logs, discussing hypoglycemia and hyperglycemia episodes and symptoms, medications doses, her options of short and long term treatment based on the latest standards of care / guidelines;  discussion about incorporating lifestyle medicine;  and documenting the encounter.    Please refer to Patient Instructions for Blood Glucose Monitoring and Insulin/Medications Dosing Guide"  in media tab for additional information. Please  also refer to " Patient Self Inventory" in the Media  tab for reviewed elements of pertinent patient history.  Frances Hughes participated in the discussions, expressed understanding, and voiced agreement with the above plans.  All questions were answered to her satisfaction. she is encouraged to contact clinic should she have any questions or concerns prior to her return visit.   Follow up plan: Return in about 6 months (around 05/05/2022) for F/U with Pre-visit Labs, A1c -NV.   Glade Lloyd, MD [facility] [address] [place], [place] [address] Phone: [phone]  Fax: [phone]     11/03/2021, 1:08 PM  This note was partially dictated with  voice recognition software. Similar sounding words can be transcribed inadequately or may not  be corrected upon review.

## 2021-11-08 DIAGNOSIS — H2513 Age-related nuclear cataract, bilateral: Secondary | ICD-10-CM | POA: Diagnosis not present

## 2021-11-08 DIAGNOSIS — H524 Presbyopia: Secondary | ICD-10-CM | POA: Diagnosis not present

## 2021-11-19 ENCOUNTER — Other Ambulatory Visit: Payer: Self-pay | Admitting: "Endocrinology

## 2021-11-19 DIAGNOSIS — E782 Mixed hyperlipidemia: Secondary | ICD-10-CM

## 2021-12-08 ENCOUNTER — Ambulatory Visit (INDEPENDENT_AMBULATORY_CARE_PROVIDER_SITE_OTHER): Payer: Medicare Other | Admitting: *Deleted

## 2021-12-08 DIAGNOSIS — Z0000 Encounter for general adult medical examination without abnormal findings: Secondary | ICD-10-CM | POA: Diagnosis not present

## 2021-12-08 LAB — HM DIABETES EYE EXAM

## 2021-12-08 NOTE — Patient Instructions (Signed)
Ms. Frances Hughes , Thank you for taking time to come for your Medicare Wellness Visit. I appreciate your ongoing commitment to your health goals. Please review the following plan we discussed and let me know if I can assist you in the future.   Screening recommendations/referrals: Colonoscopy: already set up  Mammogram: already set up Bone Density: completed Recommended yearly ophthalmology/optometry visit for glaucoma screening and checkup Recommended yearly dental visit for hygiene and checkup  Vaccinations: Influenza vaccine: completed Pneumococcal vaccine: completed Tdap vaccine: completed Shingles vaccine: due now    Advanced directives: copy requested  Conditions/risks identified: hypertension, diabetes  Next appointment: 1 year    Preventive Care 12 Years and Older, Female Preventive care refers to lifestyle choices and visits with your health care provider that can promote health and wellness. What does preventive care include? A yearly physical exam. This is also called an annual well check. Dental exams once or twice a year. Routine eye exams. Ask your health care provider how often you should have your eyes checked. Personal lifestyle choices, including: Daily care of your teeth and gums. Regular physical activity. Eating a healthy diet. Avoiding tobacco and drug use. Limiting alcohol use. Practicing safe sex. Taking low-dose aspirin every day. Taking vitamin and mineral supplements as recommended by your health care provider. What happens during an annual well check? The services and screenings done by your health care provider during your annual well check will depend on your age, overall health, lifestyle risk factors, and family history of disease. Counseling  Your health care provider may ask you questions about your: Alcohol use. Tobacco use. Drug use. Emotional well-being. Home and relationship well-being. Sexual activity. Eating habits. History of  falls. Memory and ability to understand (cognition). Work and work Statistician. Reproductive health. Screening  You may have the following tests or measurements: Height, weight, and BMI. Blood pressure. Lipid and cholesterol levels. These may be checked every 5 years, or more frequently if you are over 26 years old. Skin check. Lung cancer screening. You may have this screening every year starting at age 37 if you have a 30-pack-year history of smoking and currently smoke or have quit within the past 15 years. Fecal occult blood test (FOBT) of the stool. You may have this test every year starting at age 51. Flexible sigmoidoscopy or colonoscopy. You may have a sigmoidoscopy every 5 years or a colonoscopy every 10 years starting at age 21. Hepatitis C blood test. Hepatitis B blood test. Sexually transmitted disease (STD) testing. Diabetes screening. This is done by checking your blood sugar (glucose) after you have not eaten for a while (fasting). You may have this done every 1-3 years. Bone density scan. This is done to screen for osteoporosis. You may have this done starting at age 12. Mammogram. This may be done every 1-2 years. Talk to your health care provider about how often you should have regular mammograms. Talk with your health care provider about your test results, treatment options, and if necessary, the need for more tests. Vaccines  Your health care provider may recommend certain vaccines, such as: Influenza vaccine. This is recommended every year. Tetanus, diphtheria, and acellular pertussis (Tdap, Td) vaccine. You may need a Td booster every 10 years. Zoster vaccine. You may need this after age 46. Pneumococcal 13-valent conjugate (PCV13) vaccine. One dose is recommended after age 74. Pneumococcal polysaccharide (PPSV23) vaccine. One dose is recommended after age 18. Talk to your health care provider about which screenings and vaccines you need  and how often you need  them. This information is not intended to replace advice given to you by your health care provider. Make sure you discuss any questions you have with your health care provider. Document Released: 06/05/2015 Document Revised: 01/27/2016 Document Reviewed: 03/10/2015 Elsevier Interactive Patient Education  2017 [place] Prevention in the Home Falls can cause injuries. They can happen to people of all ages. There are many things you can do to make your home safe and to help prevent falls. What can I do on the outside of my home? Regularly fix the edges of walkways and driveways and fix any cracks. Remove anything that might make you trip as you walk through a door, such as a raised step or threshold. Trim any bushes or trees on the path to your home. Use bright outdoor lighting. Clear any walking paths of anything that might make someone trip, such as rocks or tools. Regularly check to see if handrails are loose or broken. Make sure that both sides of any steps have handrails. Any raised decks and porches should have guardrails on the edges. Have any leaves, snow, or ice cleared regularly. Use sand or salt on walking paths during winter. Clean up any spills in your garage right away. This includes oil or grease spills. What can I do in the bathroom? Use night lights. Install grab bars by the toilet and in the tub and shower. Do not use towel bars as grab bars. Use non-skid mats or decals in the tub or shower. If you need to sit down in the shower, use a plastic, non-slip stool. Keep the floor dry. Clean up any water that spills on the floor as soon as it happens. Remove soap buildup in the tub or shower regularly. Attach bath mats securely with double-sided non-slip rug tape. Do not have throw rugs and other things on the floor that can make you trip. What can I do in the bedroom? Use night lights. Make sure that you have a light by your bed that is easy to reach. Do not use  any sheets or blankets that are too big for your bed. They should not hang down onto the floor. Have a firm chair that has side arms. You can use this for support while you get dressed. Do not have throw rugs and other things on the floor that can make you trip. What can I do in the kitchen? Clean up any spills right away. Avoid walking on wet floors. Keep items that you use a lot in easy-to-reach places. If you need to reach something above you, use a strong step stool that has a grab bar. Keep electrical cords out of the way. Do not use floor polish or wax that makes floors slippery. If you must use wax, use non-skid floor wax. Do not have throw rugs and other things on the floor that can make you trip. What can I do with my stairs? Do not leave any items on the stairs. Make sure that there are handrails on both sides of the stairs and use them. Fix handrails that are broken or loose. Make sure that handrails are as long as the stairways. Check any carpeting to make sure that it is firmly attached to the stairs. Fix any carpet that is loose or worn. Avoid having throw rugs at the top or bottom of the stairs. If you do have throw rugs, attach them to the floor with carpet tape. Make sure that you  have a light switch at the top of the stairs and the bottom of the stairs. If you do not have them, ask someone to add them for you. What else can I do to help prevent falls? Wear shoes that: Do not have high heels. Have rubber bottoms. Are comfortable and fit you well. Are closed at the toe. Do not wear sandals. If you use a stepladder: Make sure that it is fully opened. Do not climb a closed stepladder. Make sure that both sides of the stepladder are locked into place. Ask someone to hold it for you, if possible. Clearly mark and make sure that you can see: Any grab bars or handrails. First and last steps. Where the edge of each step is. Use tools that help you move around (mobility aids)  if they are needed. These include: Canes. Walkers. Scooters. Crutches. Turn on the lights when you go into a dark area. Replace any light bulbs as soon as they burn out. Set up your furniture so you have a clear path. Avoid moving your furniture around. If any of your floors are uneven, fix them. If there are any pets around you, be aware of where they are. Review your medicines with your doctor. Some medicines can make you feel dizzy. This can increase your chance of falling. Ask your doctor what other things that you can do to help prevent falls. This information is not intended to replace advice given to you by your health care provider. Make sure you discuss any questions you have with your health care provider. Document Released: 03/05/2009 Document Revised: 10/15/2015 Document Reviewed: 06/13/2014 Elsevier Interactive Patient Education  2017 [employer].

## 2021-12-08 NOTE — Progress Notes (Signed)
Subjective:   AJEE HEASLEY is a 66 y.o. female who presents for an Initial Medicare Annual Wellness Visit.  I connected with  Jolyn Lent on 12/08/21 by a audio enabled telemedicine application and verified that I am speaking with the correct person using two identifiers.  Patient Location: Home  Provider Location: Office/Clinic  I discussed the limitations of evaluation and management by telemedicine. The patient expressed understanding and agreed to proceed.  Review of Systems     Ms. Rask , Thank you for taking time to come for your Medicare Wellness Visit. I appreciate your ongoing commitment to your health goals. Please review the following plan we discussed and let me know if I can assist you in the future.   These are the goals we discussed:  Goals      Patient Stated     I just want to be happy!        This is a list of the screening recommended for you and due dates:  Health Maintenance  Topic Date Due   Zoster (Shingles) Vaccine (1 of 2) Never done   COVID-19 Vaccine (3 - Moderna risk series) 08/28/2019   Eye exam for diabetics  06/24/2020   Flu Shot  12/21/2021   Mammogram  12/30/2021   Hemoglobin A1C  05/05/2022   Tetanus Vaccine  09/15/2022   Complete foot exam   09/30/2022   Colon Cancer Screening  04/17/2023   Pneumonia Vaccine  Completed   DEXA scan (bone density measurement)  Completed   Hepatitis C Screening: USPSTF Recommendation to screen - Ages 29-79 yo.  Completed   HPV Vaccine  Aged Out          Objective:    There were no vitals filed for this visit. There is no height or weight on file to calculate BMI.     08/31/2020   11:34 AM 03/19/2020    2:05 PM 03/03/2020   12:30 AM 03/02/2020    5:05 PM 02/19/2020    1:12 PM 02/14/2020   10:13 AM 02/06/2020   10:38 AM  Advanced Directives  Does Patient Have a Medical Advance Directive? Yes Yes  Yes Yes Yes Yes  Type of Paramedic of [place];Living will  Living will  [place];Living will [place];Living will  [place];Living will  Does patient want to make changes to medical advance directive? No - Patient declined No - Patient declined No - Patient declined  No - Patient declined No - Patient declined No - Patient declined  Copy of [place] in Chart? No - copy requested No - copy requested  No - copy requested No - copy requested No - copy requested No - copy requested    Current Medications (verified) Outpatient Encounter Medications as of 12/08/2021  Medication Sig   anastrozole (ARIMIDEX) 1 MG tablet Take 1 tablet (1 mg total) by mouth daily. Start 06/08/2020   aspirin 81 MG EC tablet Take 81 mg by mouth as needed for pain. Swallow whole.   atorvastatin (LIPITOR) 40 MG tablet TAKE 1 TABLET(40 MG) BY MOUTH DAILY   Blood Glucose Monitoring Suppl (BLOOD GLUCOSE MONITOR SYSTEM) W/DEVICE KIT Please dispense based on patient and insurance preference. Use to monitor fasting blood sugar 1x daily. Dx: E11.9   cloNIDine (CATAPRES) 0.2 MG tablet TAKE 1 TABLET(0.2 MG) BY MOUTH TWICE DAILY   furosemide (LASIX) 20 MG tablet Take 1 tablet (20 mg total)  by mouth daily as needed.   gabapentin (NEURONTIN) 300 MG capsule Take 1 capsule (300 mg total) by mouth at bedtime. (Patient taking differently: Take 300 mg by mouth. Take one daily qhs prn)   Glucose Blood (BLOOD GLUCOSE TEST STRIPS) STRP Please dispense based on patient and insurance preference. Use to monitor fasting blood sugar 1x daily. Dx: E11.9   Lancets 28G MISC Please dispense based on patient and insurance preference. Use to monitor fasting blood sugar 1x daily. Dx: E11.9   levothyroxine (SYNTHROID) 125 MCG tablet Take 1 tablet (125 mcg total) by mouth daily before breakfast.   losartan (COZAAR) 100 MG tablet Take 1 tablet (100 mg total) by mouth daily.   metFORMIN (GLUCOPHAGE) 500 MG tablet TAKE 1 TABLET BY MOUTH TWICE  DAILY WITH A MEAL   potassium chloride SA (KLOR-CON M) 20 MEQ tablet TAKE 1 TABLET BY MOUTH DAILY (Patient taking differently: daily as needed.)   spironolactone (ALDACTONE) 25 MG tablet TAKE 1 TABLET BY MOUTH EVERY MORNING   terbinafine (LAMISIL) 250 MG tablet Take 1 tablet (250 mg total) by mouth daily.   No facility-administered encounter medications on file as of 12/08/2021.    Allergies (verified) Patient has no known allergies.   History: Past Medical History:  Diagnosis Date   Adrenal mass, left (HCC)    Degenerative disc disease at L5-S1 level    Depression 07/15/2011   DM (diabetes mellitus) ([place])    Type II controlled   Enlarged heart    Family history of breast cancer    Family history of colon cancer    Family history of pancreatic cancer    Family history of stomach cancer    Grave's disease    HTN (hypertension)    Hyperlipidemia    Lymphocytosis    Nephrolithiasis    OSA on CPAP    Past Surgical History:  Procedure Laterality Date   ABDOMINAL HYSTERECTOMY     BREAST BIOPSY Left 01/06/2020   BREAST LUMPECTOMY WITH RADIOACTIVE SEED LOCALIZATION Left 02/19/2020   Procedure: LEFT BREAST LUMPECTOMY WITH RADIOACTIVE SEED LOCALIZATION;  Surgeon: [name], MD;  Location: [facility];  Service: General;  Laterality: Left;   COLONOSCOPY  2011   normal colonoscopy   COLONOSCOPY WITH PROPOFOL N/A 04/16/2018   Procedure: COLONOSCOPY WITH PROPOFOL;  Surgeon: Daneil Dolin, MD;  [facility];  Service: Endoscopy;  Laterality: N/A;  9:45am   HERNIA REPAIR  2011   LITHOTRIPSY     POLYPECTOMY  04/16/2018   Procedure: POLYPECTOMY;  Surgeon: Daneil Dolin, MD;  [facility];  Service: Endoscopy;;  (colon)   S/P Hysterectomy  10/1996   with bilat SOO seconardt to fibroids   thyroid ablation  1986   Family History  Problem Relation Age of Onset   Colon cancer Mother        d. 62   Cancer Mother        metastisis sarcoma    Colon cancer Sister 68   Head & neck cancer Sister        roof of mouth   Colon polyps Sister    Diabetes Father    Hypertension Father    Cancer Father    Lung cancer Father        d. 60   Colon polyps Sister    Esophageal cancer Brother    Cancer Brother        cancer of eye   Aneurysm Maternal Aunt  brain   Cancer Maternal Uncle        NOS   COPD Maternal Uncle    Stomach cancer Maternal Grandfather 76   Heart attack Maternal Grandfather    Pancreatic cancer Paternal Grandmother        d. 30   Cancer Maternal Uncle        NOS - mother's pat 1/2 brother   Breast cancer Cousin        3 maternal cousins with breast cancer, some under 49, all different branches   Colon cancer Cousin        five mat first cousins, 30 were siblings   Cancer Cousin        2 mat 1/2 cousins with NOS cancer   Social History   Socioeconomic History   Marital status: Married    Spouse name: Not on file   Number of children: Not on file   Years of education: Not on file   Highest education level: Not on file  Occupational History   Not on file  Tobacco Use   Smoking status: Former    Packs/day: 0.75    Years: 10.00    Total pack years: 7.50    Types: Cigarettes    Start date: 01/30/1969    Quit date: 05/23/1988    Years since quitting: 33.5   Smokeless tobacco: Never  Vaping Use   Vaping Use: Never used  Substance and Sexual Activity   Alcohol use: No    Alcohol/week: 0.0 standard drinks of alcohol   Drug use: No   Sexual activity: Yes  Other Topics Concern   Not on file  Social History Narrative   Not on file   Social Determinants of Health   Financial Resource Strain: Low Risk  (08/31/2020)   Overall Financial Resource Strain (CARDIA)    Difficulty of Paying Living Expenses: Not hard at all  Food Insecurity: No Food Insecurity (08/31/2020)   Hunger Vital Sign    Worried About Running Out of Food in the Last Year: Never true    [place] in the Last Year: Never  true  Transportation Needs: No Transportation Needs (08/31/2020)   PRAPARE - Hydrologist (Medical): No    Lack of Transportation (Non-Medical): No  Physical Activity: Inactive (08/31/2020)   Exercise Vital Sign    Days of Exercise per Week: 0 days    Minutes of Exercise per Session: 0 min  Stress: No Stress Concern Present (08/31/2020)   [place]    Feeling of Stress : Not at all  Social Connections: Moderately Integrated (08/31/2020)   Social Connection and Isolation Panel [NHANES]    Frequency of Communication with Friends and Family: More than three times a week    Frequency of Social Gatherings with Friends and Family: More than three times a week    Attends Religious Services: More than 4 times per year    Active Member of [employer] or Organizations: No    Attends Archivist Meetings: Never    Marital Status: Married    Tobacco Counseling Counseling given: Not Answered   Clinical Intake:              How often do you need to have someone help you when you read instructions, pamphlets, or other written materials from your doctor or pharmacy?: (P) 1 - Never  Diabetic?Nutrition Risk Assessment:  Has the patient had any  N/V/D within the last 2 months?  No  Does the patient have any non-healing wounds?  No  Has the patient had any unintentional weight loss or weight gain?  No   Diabetes:  Is the patient diabetic?  Yes  If diabetic, was a CBG obtained today?  No  Did the patient bring in their glucometer from home?  No  How often do you monitor your CBG's? When needed.   Financial Strains and Diabetes Management:  Are you having any financial strains with the device, your supplies or your medication? No .  Does the patient want to be seen by Chronic Care Management for management of their diabetes?  No  Would the patient like to be referred to a Nutritionist or for  Diabetic Management?  No   Diabetic Exams:  Diabetic Eye Exam: Completed .  Diabetic Foot Exam: Completed .           Activities of Daily Living    12/07/2021    9:15 PM  In your present state of health, do you have any difficulty performing the following activities:  Hearing? 0  Vision? 0  Difficulty concentrating or making decisions? 0  Walking or climbing stairs? 0  Dressing or bathing? 0  Doing errands, shopping? 0  Preparing Food and eating ? N  Using the Toilet? N  In the past six months, have you accidently leaked urine? Y  Do you have problems with loss of bowel control? N  Managing your Medications? N  Managing your Finances? N  Housekeeping or managing your Housekeeping? N    Patient Care Team: Lindell Spar, MD as PCP - General (Internal Medicine) Harl Bowie Alphonse Guild, MD as PCP - Cardiology (Cardiology) Gala Romney Cristopher Estimable, MD as Consulting Physician (Gastroenterology) Mauro Kaufmann, RN as Oncology Nurse Navigator Rockwell Germany, RN as Oncology Nurse Navigator Magrinat, Virgie Dad, MD (Inactive) as Consulting Physician (Oncology) Kyung Rudd, MD as Consulting Physician (Radiation Oncology) [name], MD as Consulting Physician (General Surgery)  Indicate any recent Medical Services you may have received from other than [facility] providers in the past year (date may be approximate).     Assessment:   This is a routine wellness examination for [facility].  Hearing/Vision screen No results found.  Dietary issues and exercise activities discussed:     Goals Addressed   None   Depression Screen    09/29/2021   10:53 AM 08/31/2020   11:39 AM 06/12/2020    9:10 AM 04/20/2020   11:55 AM 12/18/2019    8:19 AM 09/09/2019    8:10 AM 02/08/2019    8:57 AM  PHQ 2/9 Scores  PHQ - 2 Score '1 2 3 2 2 2 ' 0  PHQ- 9 Score '1 6 14 4 8 8     ' Fall Risk    12/07/2021    9:15 PM 09/29/2021   10:52 AM 08/31/2020   11:36 AM 06/12/2020    9:10 AM 04/20/2020   11:55 AM   Fall Risk   Falls in the past year? 0 0 0 0 0  Number falls in past yr: 0 0 0    Injury with Fall? 0 0 0    Risk for fall due to :  No Fall Risks No Fall Risks No Fall Risks No Fall Risks  Follow up  Falls evaluation completed Falls evaluation completed;Falls prevention discussed Falls evaluation completed Falls evaluation completed    FALL RISK PREVENTION PERTAINING TO THE HOME:  Any stairs  in or around the home? Yes  If so, are there any without handrails? No  Home free of loose throw rugs in walkways, pet beds, electrical cords, etc? Yes  Adequate lighting in your home to reduce risk of falls? Yes   ASSISTIVE DEVICES UTILIZED TO PREVENT FALLS:  Life alert? No  Use of a cane, walker or w/c? No  Grab bars in the bathroom? No  Shower chair or bench in shower? No  Elevated toilet seat or a handicapped toilet? No    Cognitive Function:        Immunizations Immunization History  Administered Date(s) Administered   Fluad Quad(high Dose 65+) 02/08/2019, 03/05/2020   Influenza-Unspecified 03/21/2011, 03/20/2015   Moderna Sars-Covid-2 Vaccination 07/08/2019, 07/31/2019   PNEUMOCOCCAL CONJUGATE-20 09/29/2021   Pneumococcal Polysaccharide-23 04/13/2007, 03/05/2020   Tdap 09/14/2012    TDAP status: Up to date  Flu Vaccine status: Up to date  Pneumococcal vaccine status: Up to date  Covid-19 vaccine status: Completed vaccines  Qualifies for Shingles Vaccine? Yes   Zostavax completed No   Shingrix Completed?: No.    Education has been provided regarding the importance of this vaccine. Patient has been advised to call insurance company to determine out of pocket expense if they have not yet received this vaccine. Advised may also receive vaccine at local pharmacy or Health Dept. Verbalized acceptance and understanding.  Screening Tests Health Maintenance  Topic Date Due   Zoster Vaccines- Shingrix (1 of 2) Never done   COVID-19 Vaccine (3 - Moderna risk series) 08/28/2019    OPHTHALMOLOGY EXAM  06/24/2020   INFLUENZA VACCINE  12/21/2021   MAMMOGRAM  12/30/2021   HEMOGLOBIN A1C  05/05/2022   TETANUS/TDAP  09/15/2022   FOOT EXAM  09/30/2022   COLONOSCOPY (Pts 45-17yr Insurance coverage will need to be confirmed)  04/17/2023   Pneumonia Vaccine 68 Years old  Completed   DEXA SCAN  Completed   Hepatitis C Screening  Completed   HPV VACCINES  Aged Out    Health Maintenance  Health Maintenance Due  Topic Date Due   Zoster Vaccines- Shingrix (1 of 2) Never done   COVID-19 Vaccine (3 - Moderna risk series) 08/28/2019   OPHTHALMOLOGY EXAM  06/24/2020    Colorectal cancer screening: Type of screening: Colonoscopy. Completed 04-16-18. Repeat every 5 years  Mammogram status: Completed 12-30-20. Repeat every year  Bone Density status: Completed 03-10-21. Results reflect: Bone density results: NORMAL. Repeat every 3 years.  Lung Cancer Screening: (Low Dose CT Chest recommended if Age 68-80years, 30 pack-year currently smoking OR have quit w/in 15years.) does not qualify.   Lung Cancer Screening Referral: NA  Additional Screening:  Hepatitis C Screening: does qualify; Completed 10-13-17  Vision Screening: Recommended annual ophthalmology exams for early detection of glaucoma and other disorders of the eye. Is the patient up to date with their annual eye exam?  Yes  Who is the provider or what is the name of the [facility] which the patient [facility] provider, would they like to be referred to a provider to establish care? No .   Dental Screening: Recommended annual dental exams for proper oral hygiene  Community Resource Referral / Chronic Care Management: CRR required this visit?  No   CCM required this visit?  No      Plan:     I have personally reviewed and noted the following in the patient's chart:   Medical  and social history Use of alcohol, tobacco or illicit drugs   Current medications and supplements including opioid prescriptions. Patient is not currently taking opioid prescriptions. Functional ability and status Nutritional status Physical activity Advanced directives List of other physicians Hospitalizations, surgeries, and ER visits in previous 12 months Vitals Screenings to include cognitive, depression, and falls Referrals and appointments  In addition, I have reviewed and discussed with patient certain preventive protocols, quality metrics, and best practice recommendations. A written personalized care plan for preventive services as well as general preventive health recommendations were provided to patient.     Shelda Altes, CMA   12/08/2021   Nurse Notes:  Ms. Faires , Thank you for taking time to come for your Medicare Wellness Visit. I appreciate your ongoing commitment to your health goals. Please review the following plan we discussed and let me know if I can assist you in the future.   These are the goals we discussed:  Goals      Patient Stated     I just want to be happy!        This is a list of the screening recommended for you and due dates:  Health Maintenance  Topic Date Due   Zoster (Shingles) Vaccine (1 of 2) Never done   COVID-19 Vaccine (3 - Moderna risk series) 08/28/2019   Eye exam for diabetics  06/24/2020   Flu Shot  12/21/2021   Mammogram  12/30/2021   Hemoglobin A1C  05/05/2022   Tetanus Vaccine  09/15/2022   Complete foot exam   09/30/2022   Colon Cancer Screening  04/17/2023   Pneumonia Vaccine  Completed   DEXA scan (bone density measurement)  Completed   Hepatitis C Screening: USPSTF Recommendation to screen - Ages 63-79 yo.  Completed   HPV Vaccine  Aged Out

## 2021-12-09 ENCOUNTER — Encounter: Payer: Self-pay | Admitting: *Deleted

## 2021-12-23 ENCOUNTER — Encounter: Payer: Self-pay | Admitting: *Deleted

## 2021-12-23 NOTE — Patient Instructions (Signed)
Procedure: colonoscopy  Estimated body mass index is 44.56 kg/m as calculated from the following:   Height as of 11/03/21: _0  (1.575 m).   Weight as of 11/03/21: 243 lb 9.6 oz (110.5 kg).  Have you had a colonoscopy before?  Yes, 04/16/18, Dr.Rourk  Do you have family history of colon cancer  Yes  Previous colonoscopy with polyps removed? Yes, 04/16/2018  Do you have a history colorectal cancer?   Pt did not answer  Are you diabetic?  Yes   Do you have a prosthetic or mechanical heart valve? No  Do you have a pacemaker/defibrillator?   No  Have you had endocarditis/atrial fibrillation?  Yes  Do you use supplemental oxygen/CPAP?  No  Have you had joint replacement within the last 12 months?  No  Do you tend to be constipated or have to use laxatives?  No   Do you have history of alcohol use? If yes, how much and how often.  No  Do you have history or are you using drugs? If yes, what do are you  using?  No  Have you ever had a stroke/heart attack?  No  Have you ever had a heart or other vascular stent placed,?No  Do you take weight loss medication? No  female patients,: do you still have your menstrual cycle? No      Do you take any blood-thinning medications such as: (Plavix, aspirin, Coumadin, Aggrenox, Brilinta, Xarelto, Eliquis, Pradaxa, Savaysa or Effient) Yes  If yes we need the name, milligram, dosage and who is prescribing doctor:  aspirin 81 mg tablet             Current Outpatient Medications  Medication Sig Dispense Refill   anastrozole (ARIMIDEX) 1 MG tablet Take 1 tablet (1 mg total) by mouth daily. Start 06/08/2020 90 tablet 4   aspirin 81 MG EC tablet Take 81 mg by mouth as needed for pain. Swallow whole.     atorvastatin (LIPITOR) 40 MG tablet TAKE 1 TABLET(40 MG) BY MOUTH DAILY 90 tablet 0   Blood Glucose Monitoring Suppl (BLOOD GLUCOSE MONITOR SYSTEM) W/DEVICE KIT Please dispense based on patient and insurance preference. Use to monitor fasting  blood sugar 1x daily. Dx: E11.9 1 each 0   cloNIDine (CATAPRES) 0.2 MG tablet TAKE 1 TABLET(0.2 MG) BY MOUTH TWICE DAILY 180 tablet 1   furosemide (LASIX) 20 MG tablet Take 1 tablet (20 mg total) by mouth daily as needed. 30 tablet 1   Glucose Blood (BLOOD GLUCOSE TEST STRIPS) STRP Please dispense based on patient and insurance preference. Use to monitor fasting blood sugar 1x daily. Dx: E11.9 100 each 0   Lancets 28G MISC Please dispense based on patient and insurance preference. Use to monitor fasting blood sugar 1x daily. Dx: E11.9 100 each 0   levothyroxine (SYNTHROID) 125 MCG tablet Take 1 tablet (125 mcg total) by mouth daily before breakfast. 90 tablet 1   losartan (COZAAR) 100 MG tablet Take 1 tablet (100 mg total) by mouth daily. 90 tablet 1   metFORMIN (GLUCOPHAGE) 500 MG tablet TAKE 1 TABLET BY MOUTH TWICE DAILY WITH A MEAL 180 tablet 1   potassium chloride SA (KLOR-CON M) 20 MEQ tablet TAKE 1 TABLET BY MOUTH DAILY (Patient taking differently: daily as needed.) 90 tablet 0   spironolactone (ALDACTONE) 25 MG tablet TAKE 1 TABLET BY MOUTH EVERY MORNING 90 tablet 1   terbinafine (LAMISIL) 250 MG tablet Take 1 tablet (250 mg total) by mouth daily. [place]  tablet 2   No current facility-administered medications for this visit.    No Known Allergies

## 2021-12-27 ENCOUNTER — Other Ambulatory Visit: Payer: Self-pay | Admitting: "Endocrinology

## 2021-12-27 ENCOUNTER — Other Ambulatory Visit: Payer: Self-pay | Admitting: Nurse Practitioner

## 2022-01-03 ENCOUNTER — Encounter: Payer: Self-pay | Admitting: *Deleted

## 2022-01-03 MED ORDER — PEG 3350-KCL-NA BICARB-NACL 420 G PO SOLR: 4000.0000 mL | Freq: Once | ORAL | 0 refills | Status: AC

## 2022-01-03 NOTE — Progress Notes (Signed)
Patient scheduled for 01/26/22 @ 11:15 am. Instructions and pre-op appt to be mailed to pt. Prep sent to the pharmacy.

## 2022-01-03 NOTE — Progress Notes (Signed)
Unable to leave message, mailbox is full.

## 2022-01-04 ENCOUNTER — Encounter: Payer: Self-pay | Admitting: *Deleted

## 2022-01-18 ENCOUNTER — Telehealth: Payer: Self-pay | Admitting: *Deleted

## 2022-01-18 NOTE — Patient Outreach (Signed)
  Care Coordination   01/18/2022 Name: Frances Hughes MRN: [id number] DOB: 12-24-1955   Care Coordination Outreach Attempts:  An unsuccessful telephone outreach was attempted today to offer the patient information about available care coordination services as a benefit of their health plan.   Follow Up Plan:  Additional outreach attempts will be made to offer the patient care coordination information and services.   Encounter Outcome:  No Answer  Care Coordination Interventions Activated:  No   Care Coordination Interventions:  No, not indicated    Emelia Loron RN, BSN [place] [phone] [name]

## 2022-01-21 ENCOUNTER — Telehealth: Payer: Self-pay | Admitting: *Deleted

## 2022-01-21 NOTE — Telephone Encounter (Signed)
PA:APPROVED Authorization #: [id number]  DOS: 01/26/22-04/26/22

## 2022-01-21 NOTE — Patient Instructions (Signed)
ISSIS LINDSETH  01/21/2022     '@PREFPERIOPPHARMACY'$ @   Your procedure is scheduled on  01/26/2022.   Report to [facility] at  [email or web address]   Call this number if you have problems the morning of surgery:  [phone]   Remember:  Follow the diet and prep instructions given to you by the office.      DO NOT take any medications for diabetes the morning of your procedure.     Take these medicines the morning of surgery with A SIP OF WATER                                     arimidex, catapres, synthroid.     Do not wear jewelry, make-up or nail polish.  Do not wear lotions, powders, or perfumes, or deodorant.  Do not shave 48 hours prior to surgery.  Men may shave face and neck.  Do not bring valuables to the hospital.  [facility] is not responsible for any belongings or valuables.  Contacts, dentures or bridgework may not be worn into surgery.  Leave your suitcase in the car.  After surgery it may be brought to your room.  For patients admitted to the hospital, discharge time will be determined by your treatment team.  Patients discharged the day of surgery will not be allowed to drive home and must have someone with them for 24 hours.    Special instructions:   DO NOT smoke tobacco or vape for 24 hors before your procedure.   Please read over the following fact sheets that you were given. Anesthesia Post-op Instructions and Care and Recovery After Surgery      Colonoscopy, Adult, Care After The following information offers guidance on how to care for yourself after your procedure. Your health care provider may also give you more specific instructions. If you have problems or questions, contact your health care provider. What can I expect after the procedure? After the procedure, it is common to have: A small amount of blood in your stool for 24 hours after the procedure. Some gas. Mild cramping or bloating of your abdomen. Follow these instructions at  home: Eating and drinking  Drink enough fluid to keep your urine pale yellow. Follow instructions from your health care provider about eating or drinking restrictions. Resume your normal diet as told by your health care provider. Avoid heavy or fried foods that are hard to digest. Activity Rest as told by your health care provider. Avoid sitting for a long time without moving. Get up to take short walks every 1-2 hours. This is important to improve blood flow and breathing. Ask for help if you feel weak or unsteady. Return to your normal activities as told by your health care provider. Ask your health care provider what activities are safe for you. Managing cramping and bloating  Try walking around when you have cramps or feel bloated. If directed, apply heat to your abdomen as told by your health care provider. Use the heat source that your health care provider recommends, such as a moist heat pack or a heating pad. Place a towel between your skin and the heat source. Leave the heat on for 20-30 minutes. Remove the heat if your skin turns bright red. This is especially important if you are unable to feel pain, heat, or cold. You have a greater risk  of getting burned. General instructions If you were given a sedative during the procedure, it can affect you for several hours. Do not drive or operate machinery until your health care provider says that it is safe. For the first 24 hours after the procedure: Do not sign important documents. Do not drink alcohol. Do your regular daily activities at a slower pace than normal. Eat soft foods that are easy to digest. Take over-the-counter and prescription medicines only as told by your health care provider. Keep all follow-up visits. This is important. Contact a health care provider if: You have blood in your stool 2-3 days after the procedure. Get help right away if: You have more than a small spotting of blood in your stool. You have large  blood clots in your stool. You have swelling of your abdomen. You have nausea or vomiting. You have a fever. You have increasing pain in your abdomen that is not relieved with medicine. These symptoms may be an emergency. Get help right away. Call 911. Do not wait to see if the symptoms will go away. Do not drive yourself to the hospital. Summary After the procedure, it is common to have a small amount of blood in your stool. You may also have mild cramping and bloating of your abdomen. If you were given a sedative during the procedure, it can affect you for several hours. Do not drive or operate machinery until your health care provider says that it is safe. Get help right away if you have a lot of blood in your stool, nausea or vomiting, a fever, or increased pain in your abdomen. This information is not intended to replace advice given to you by your health care provider. Make sure you discuss any questions you have with your health care provider. Document Revised: 12/30/2020 Document Reviewed: 12/30/2020 Elsevier Patient Education  [place] After This sheet gives you information about how to care for yourself after your procedure. Your health care provider may also give you more specific instructions. If you have problems or questions, contact your health care provider. What can I expect after the procedure? After the procedure, it is common to have: Tiredness. Forgetfulness about what happened after the procedure. Impaired judgment for important decisions. Nausea or vomiting. Some difficulty with balance. Follow these instructions at home: For the time period you were told by your health care provider:     Rest as needed. Do not participate in activities where you could fall or become injured. Do not drive or use machinery. Do not drink alcohol. Do not take sleeping pills or medicines that cause drowsiness. Do not make important  decisions or sign legal documents. Do not take care of children on your own. Eating and drinking Follow the diet that is recommended by your health care provider. Drink enough fluid to keep your urine pale yellow. If you vomit: Drink water, juice, or soup when you can drink without vomiting. Make sure you have little or no nausea before eating solid foods. General instructions Have a responsible adult stay with you for the time you are told. It is important to have someone help care for you until you are awake and alert. Take over-the-counter and prescription medicines only as told by your health care provider. If you have sleep apnea, surgery and certain medicines can increase your risk for breathing problems. Follow instructions from your health care provider about wearing your sleep device: Anytime you are sleeping, including during daytime  naps. While taking prescription pain medicines, sleeping medicines, or medicines that make you drowsy. Avoid smoking. Keep all follow-up visits as told by your health care provider. This is important. Contact a health care provider if: You keep feeling nauseous or you keep vomiting. You feel light-headed. You are still sleepy or having trouble with balance after 24 hours. You develop a rash. You have a fever. You have redness or swelling around the IV site. Get help right away if: You have trouble breathing. You have new-onset confusion at home. Summary For several hours after your procedure, you may feel tired. You may also be forgetful and have poor judgment. Have a responsible adult stay with you for the time you are told. It is important to have someone help care for you until you are awake and alert. Rest as told. Do not drive or operate machinery. Do not drink alcohol or take sleeping pills. Get help right away if you have trouble breathing, or if you suddenly become confused. This information is not intended to replace advice given to you  by your health care provider. Make sure you discuss any questions you have with your health care provider. Document Revised: 04/13/2021 Document Reviewed: 04/11/2019 Elsevier Patient Education  [place].

## 2022-01-25 ENCOUNTER — Encounter (HOSPITAL_COMMUNITY): Payer: Self-pay

## 2022-01-25 ENCOUNTER — Encounter (HOSPITAL_COMMUNITY)
Admission: RE | Admit: 2022-01-25 | Discharge: 2022-01-25 | Disposition: A | Discharge status: Home / Self Care | Payer: Medicare Other | Source: Ambulatory Visit | Attending: Internal Medicine | Admitting: Internal Medicine

## 2022-01-25 VITALS — BP 190/92 | HR 70 | Temp 98.5°F | Resp 18 | Ht 62.0 in | Wt 243.6 lb

## 2022-01-25 DIAGNOSIS — I10 Essential (primary) hypertension: Secondary | ICD-10-CM | POA: Diagnosis not present

## 2022-01-25 DIAGNOSIS — Z01818 Encounter for other preprocedural examination: Secondary | ICD-10-CM | POA: Diagnosis not present

## 2022-01-25 DIAGNOSIS — E119 Type 2 diabetes mellitus without complications: Secondary | ICD-10-CM | POA: Diagnosis not present

## 2022-01-25 HISTORY — DX: Hypothyroidism, unspecified: E03.9

## 2022-01-25 HISTORY — DX: Personal history of urinary calculi: Z87.442

## 2022-01-25 HISTORY — DX: Malignant neoplasm of unspecified site of unspecified female breast: C50.919

## 2022-01-25 LAB — BASIC METABOLIC PANEL
Anion gap: 7 (ref 5–15)
BUN: 17 mg/dL (ref 8–23)
CO2: 28 mmol/L (ref 22–32)
Calcium: 9.2 mg/dL (ref 8.9–10.3)
Chloride: 105 mmol/L (ref 98–111)
Creatinine, Ser: 0.78 mg/dL (ref 0.44–1.00)
GFR, Estimated: >60 mL/min (ref >60)
Glucose, Bld: 98 mg/dL (ref 70–99)
Potassium: 3.9 mmol/L (ref 3.5–5.1)
Sodium: 140 mmol/L (ref 135–145)

## 2022-01-26 ENCOUNTER — Ambulatory Visit (HOSPITAL_COMMUNITY): Payer: Medicare Other | Admitting: Anesthesiology

## 2022-01-26 ENCOUNTER — Ambulatory Visit (HOSPITAL_COMMUNITY)
Admission: RE | Admit: 2022-01-26 | Discharge: 2022-01-26 | Disposition: A | Discharge status: Home / Self Care | Payer: Medicare Other | Attending: Internal Medicine | Admitting: Internal Medicine

## 2022-01-26 ENCOUNTER — Ambulatory Visit (HOSPITAL_BASED_OUTPATIENT_CLINIC_OR_DEPARTMENT_OTHER): Payer: Medicare Other | Admitting: Anesthesiology

## 2022-01-26 ENCOUNTER — Encounter (HOSPITAL_COMMUNITY)
Admission: RE | Disposition: A | Discharge status: Home / Self Care | Payer: Self-pay | Source: Home / Self Care | Attending: Internal Medicine

## 2022-01-26 ENCOUNTER — Encounter (HOSPITAL_COMMUNITY): Payer: Self-pay | Admitting: Internal Medicine

## 2022-01-26 DIAGNOSIS — Z1211 Encounter for screening for malignant neoplasm of colon: Secondary | ICD-10-CM | POA: Insufficient documentation

## 2022-01-26 DIAGNOSIS — G4733 Obstructive sleep apnea (adult) (pediatric): Secondary | ICD-10-CM | POA: Insufficient documentation

## 2022-01-26 DIAGNOSIS — Z8249 Family history of ischemic heart disease and other diseases of the circulatory system: Secondary | ICD-10-CM | POA: Insufficient documentation

## 2022-01-26 DIAGNOSIS — I10 Essential (primary) hypertension: Secondary | ICD-10-CM | POA: Insufficient documentation

## 2022-01-26 DIAGNOSIS — Z87891 Personal history of nicotine dependence: Secondary | ICD-10-CM | POA: Insufficient documentation

## 2022-01-26 DIAGNOSIS — Z833 Family history of diabetes mellitus: Secondary | ICD-10-CM | POA: Diagnosis not present

## 2022-01-26 DIAGNOSIS — Z09 Encounter for follow-up examination after completed treatment for conditions other than malignant neoplasm: Secondary | ICD-10-CM | POA: Diagnosis not present

## 2022-01-26 DIAGNOSIS — Z86010 Personal history of colonic polyps: Secondary | ICD-10-CM | POA: Insufficient documentation

## 2022-01-26 DIAGNOSIS — E119 Type 2 diabetes mellitus without complications: Secondary | ICD-10-CM | POA: Diagnosis not present

## 2022-01-26 DIAGNOSIS — D123 Benign neoplasm of transverse colon: Secondary | ICD-10-CM | POA: Insufficient documentation

## 2022-01-26 DIAGNOSIS — Z7984 Long term (current) use of oral hypoglycemic drugs: Secondary | ICD-10-CM | POA: Diagnosis not present

## 2022-01-26 DIAGNOSIS — K635 Polyp of colon: Secondary | ICD-10-CM

## 2022-01-26 DIAGNOSIS — E039 Hypothyroidism, unspecified: Secondary | ICD-10-CM | POA: Diagnosis not present

## 2022-01-26 DIAGNOSIS — K641 Second degree hemorrhoids: Secondary | ICD-10-CM

## 2022-01-26 DIAGNOSIS — Z79899 Other long term (current) drug therapy: Secondary | ICD-10-CM | POA: Diagnosis not present

## 2022-01-26 DIAGNOSIS — Z8371 Family history of colonic polyps: Secondary | ICD-10-CM | POA: Insufficient documentation

## 2022-01-26 DIAGNOSIS — Z800 Family history of malignant neoplasm of digestive organs: Secondary | ICD-10-CM | POA: Diagnosis not present

## 2022-01-26 DIAGNOSIS — G4730 Sleep apnea, unspecified: Secondary | ICD-10-CM | POA: Diagnosis not present

## 2022-01-26 DIAGNOSIS — M1990 Unspecified osteoarthritis, unspecified site: Secondary | ICD-10-CM | POA: Insufficient documentation

## 2022-01-26 HISTORY — PX: COLONOSCOPY WITH PROPOFOL: SHX5780

## 2022-01-26 HISTORY — PX: POLYPECTOMY: SHX5525

## 2022-01-26 LAB — GLUCOSE, CAPILLARY: Glucose-Capillary: 116 mg/dL — ABNORMAL HIGH (ref 70–99)

## 2022-01-26 SURGERY — COLONOSCOPY WITH PROPOFOL
Anesthesia: General

## 2022-01-26 MED ORDER — PROPOFOL 500 MG/50ML IV EMUL
INTRAVENOUS | Status: DC | PRN
  Administered 2022-01-26: 200 ug/kg/min via INTRAVENOUS

## 2022-01-26 MED ORDER — PROPOFOL 10 MG/ML IV BOLUS
INTRAVENOUS | Status: DC | PRN
  Administered 2022-01-26: 110 mg via INTRAVENOUS

## 2022-01-26 MED ORDER — LACTATED RINGERS IV SOLN
INTRAVENOUS | Status: DC
  Administered 2022-01-26: 1000 mL via INTRAVENOUS

## 2022-01-26 MED ORDER — LIDOCAINE HCL (CARDIAC) PF 100 MG/5ML IV SOSY
PREFILLED_SYRINGE | INTRAVENOUS | Status: DC | PRN
  Administered 2022-01-26: 50 mg via INTRATRACHEAL

## 2022-01-26 NOTE — Transfer of Care (Signed)
Immediate Anesthesia Transfer of Care Note  Patient: Frances Hughes  Procedure(s) Performed: COLONOSCOPY WITH PROPOFOL POLYPECTOMY  Patient Location: Short Stay  Anesthesia Type:MAC  Level of Consciousness: sedated, patient cooperative and responds to stimulation  Airway & Oxygen Therapy: Patient Spontanous Breathing  Post-op Assessment: Report given to RN and Post -op Vital signs reviewed and stable  Post vital signs: Reviewed and stable  Last Vitals:  Vitals Value Taken Time  BP    Temp    Pulse    Resp    SpO2      Last Pain:  Vitals:   01/26/22 1055  TempSrc:   PainSc: 0-No pain      Patients Stated Pain Goal: 6 (12/45/80 9983)  Complications: No notable events documented.

## 2022-01-26 NOTE — Discharge Instructions (Signed)
  Colonoscopy Discharge Instructions  Read the instructions outlined below and refer to this sheet in the next few weeks. These discharge instructions provide you with general information on caring for yourself after you leave the hospital. Your doctor may also give you specific instructions. While your treatment has been planned according to the most current medical practices available, unavoidable complications occasionally occur. If you have any problems or questions after discharge, call Dr. Gala Romney at [phone]. ACTIVITY You may resume your regular activity, but move at a slower pace for the next 24 hours.  Take frequent rest periods for the next 24 hours.  Walking will help get rid of the air and reduce the bloated feeling in your belly (abdomen).  No driving for 24 hours (because of the medicine (anesthesia) used during the test).   Do not sign any important legal documents or operate any machinery for 24 hours (because of the anesthesia used during the test).  NUTRITION Drink plenty of fluids.  You may resume your normal diet as instructed by your doctor.  Begin with a light meal and progress to your normal diet. Heavy or fried foods are harder to digest and may make you feel sick to your stomach (nauseated).  Avoid alcoholic beverages for 24 hours or as instructed.  MEDICATIONS You may resume your normal medications unless your doctor tells you otherwise.  WHAT YOU CAN EXPECT TODAY Some feelings of bloating in the abdomen.  Passage of more gas than usual.  Spotting of blood in your stool or on the toilet paper.  IF YOU HAD POLYPS REMOVED DURING THE COLONOSCOPY: No aspirin products for 7 days or as instructed.  No alcohol for 7 days or as instructed.  Eat a soft diet for the next 24 hours.  FINDING OUT THE RESULTS OF YOUR TEST Not all test results are available during your visit. If your test results are not back during the visit, make an appointment with your caregiver to find out the  results. Do not assume everything is normal if you have not heard from your caregiver or the medical facility. It is important for you to follow up on all of your test results.  SEEK IMMEDIATE MEDICAL ATTENTION IF: You have more than a spotting of blood in your stool.  Your belly is swollen (abdominal distention).  You are nauseated or vomiting.  You have a temperature over 101.  You have abdominal pain or discomfort that is severe or gets worse throughout the day.    Only 1 polyp found today-it was removed.    Further recommendations to follow pending review of  pathology report  at patient request, I called Ellard Artis at [phone] findings and recommendations

## 2022-01-26 NOTE — Anesthesia Preprocedure Evaluation (Signed)
Anesthesia Evaluation  Patient identified by MRN, date of birth, ID band Patient awake    Reviewed: Allergy & Precautions, NPO status , Patient's Chart, lab work & pertinent test results  Airway Mallampati: III  TM Distance: >3 FB Neck ROM: Full    Dental  (+) Dental Advisory Given, Missing, Chipped   Pulmonary sleep apnea and Continuous Positive Airway Pressure Ventilation , former smoker,    Pulmonary exam normal breath sounds clear to auscultation       Cardiovascular Exercise Tolerance: Good hypertension, Pt. on medications + Valvular Problems/Murmurs  Rhythm:Regular Rate:Normal + Systolic murmurs    Neuro/Psych PSYCHIATRIC DISORDERS Depression negative neurological ROS     GI/Hepatic Neg liver ROS, GERD  Controlled,  Endo/Other  diabetes, Well Controlled, Type 2, Oral Hypoglycemic AgentsHypothyroidism Morbid obesity  Renal/GU Renal disease (stones)  negative genitourinary   Musculoskeletal  (+) Arthritis , Osteoarthritis,    Abdominal   Peds negative pediatric ROS (+)  Hematology negative hematology ROS (+)   Anesthesia Other Findings Left adrenal mass Left Breast cancer  Reproductive/Obstetrics negative OB ROS                             Anesthesia Physical Anesthesia Plan  ASA: 3  Anesthesia Plan: General   Post-op Pain Management: Minimal or no pain anticipated   Induction: Intravenous  PONV Risk Score and Plan: Propofol infusion  Airway Management Planned: Nasal Cannula and Natural Airway  Additional Equipment:   Intra-op Plan:   Post-operative Plan:   Informed Consent: I have reviewed the patients History and Physical, chart, labs and discussed the procedure including the risks, benefits and alternatives for the proposed anesthesia with the patient or authorized representative who has indicated his/her understanding and acceptance.     Dental advisory  given  Plan Discussed with: CRNA and Surgeon  Anesthesia Plan Comments:         Anesthesia Quick Evaluation

## 2022-01-26 NOTE — Op Note (Signed)
[facility] Patient Name: Frances Hughes Procedure Date: 01/26/2022 10:41 AM MRN: [id number] Date of Birth: [birth year] Attending MD: Norvel Richards , MD CSN: [id number] Age: 66 Admit Type: Outpatient Procedure:                Colonoscopy Indications:              High risk colon cancer surveillance: Personal                            history of colonic polyps Providers:                Norvel Richards, MD, Crystal Page, Caprice Kluver,                            Everardo Pacific Referring MD:              Medicines:                Propofol per Anesthesia Complications:            No immediate complications. Estimated Blood Loss:     Estimated blood loss was minimal. Procedure:                Pre-Anesthesia Assessment:                           - Prior to the procedure, a History and Physical                            was performed, and patient medications and                            allergies were reviewed. The patient's tolerance of                            previous anesthesia was also reviewed. The risks                            and benefits of the procedure and the sedation                            options and risks were discussed with the patient.                            All questions were answered, and informed consent                            was obtained. Prior Anticoagulants: The patient has                            taken no previous anticoagulant or antiplatelet                            agents. ASA Grade Assessment: III - A patient with  severe systemic disease. After reviewing the risks                            and benefits, the patient was deemed in                            satisfactory condition to undergo the procedure.                           After obtaining informed consent, the colonoscope                            was passed under direct vision. Throughout the                            procedure, the  patient's blood pressure, pulse, and                            oxygen saturations were monitored continuously. The                            [id number]) scope was introduced through the                            anus and advanced to the the cecum, identified by                            appendiceal orifice and ileocecal valve. The                            colonoscopy was performed without difficulty. The                            patient tolerated the procedure well. The quality                            of the bowel preparation was adequate. Scope In: 10:59:32 AM Scope Out: 11:12:31 AM Scope Withdrawal Time: 0 hours 7 minutes 36 seconds  Total Procedure Duration: 0 hours 12 minutes 59 seconds  Findings:      The perianal and digital rectal examinations were normal.      A 5 mm polyp was found in the hepatic flexure. The polyp was       semi-pedunculated. The polyp was removed with a cold snare. Resection       and retrieval were complete. Estimated blood loss was minimal.      Non-bleeding internal hemorrhoids were found during retroflexion. The       hemorrhoids were moderate, medium-sized and Grade II (internal       hemorrhoids that prolapse but reduce spontaneously).      The exam was otherwise without abnormality on direct and retroflexion       views. Impression:               - One 5 mm polyp at the hepatic flexure, removed  with a cold snare. Resected and retrieved.                           - Non-bleeding internal hemorrhoids.                           - The examination was otherwise normal on direct                            and retroflexion views. Moderate Sedation:      Moderate (conscious) sedation was personally administered by an       anesthesia professional. The following parameters were monitored: oxygen       saturation, heart rate, blood pressure, respiratory rate, EKG, adequacy       of pulmonary ventilation, and response  to care. Recommendation:           - Patient has a contact number available for                            emergencies. The signs and symptoms of potential                            delayed complications were discussed with the                            patient. Return to normal activities tomorrow.                            Written discharge instructions were provided to the                            patient.                           - Resume previous diet.                           - Continue present medications.                           - Repeat colonoscopy date to be determined after                            pending pathology results are reviewed for                            surveillance.                           - Return to GI office (date not yet determined). Procedure Code(s):        --- Professional ---                           [phone], Colonoscopy, flexible; with removal of  tumor(s), polyp(s), or other lesion(s) by snare                            technique Diagnosis Code(s):        --- Professional ---                           Z86.010, Personal history of colonic polyps                           K63.5, Polyp of colon                           K64.1, Second degree hemorrhoids CPT copyright 2019 American Medical Association. All rights reserved. The codes documented in this report are preliminary and upon coder review may  be revised to meet current compliance requirements. Cristopher Estimable. Melinda Pottinger, MD Norvel Richards, MD 01/26/2022 11:19:27 AM This report has been signed electronically. Number of Addenda: 0

## 2022-01-26 NOTE — H&P (Signed)
'@LOGO' @   Primary Care Physician:  Lindell Spar, MD Primary Gastroenterologist:  Dr. Gala Romney  Pre-Procedure History & Physical: HPI:  Frances Hughes is a 66 y.o. female here for  a surveillance colonoscopy.  He has a history of multiple colonic adenomas removed from her colon 2019.  No bowel symptoms at this time.  Past Medical History:  Diagnosis Date   Adrenal mass, left ([place])    Breast cancer ([place])    Degenerative disc disease at L5-S1 level    Depression 07/15/2011   DM (diabetes mellitus) ([place])    Type II controlled   Enlarged heart    Family history of breast cancer    Family history of colon cancer    Family history of pancreatic cancer    Family history of stomach cancer    Grave's disease    History of kidney stones    HTN (hypertension)    Hyperlipidemia    Hypothyroidism    Lymphocytosis    Nephrolithiasis    OSA on CPAP     Past Surgical History:  Procedure Laterality Date   ABDOMINAL HYSTERECTOMY     BREAST BIOPSY Left 01/06/2020   BREAST LUMPECTOMY WITH RADIOACTIVE SEED LOCALIZATION Left 02/19/2020   Procedure: LEFT BREAST LUMPECTOMY WITH RADIOACTIVE SEED LOCALIZATION;  Surgeon: [name], MD;  Location: [facility];  Service: General;  Laterality: Left;   COLONOSCOPY  2011   normal colonoscopy   COLONOSCOPY WITH PROPOFOL N/A 04/16/2018   Procedure: COLONOSCOPY WITH PROPOFOL;  Surgeon: Daneil Dolin, MD;  [facility];  Service: Endoscopy;  Laterality: N/A;  9:45am   HERNIA REPAIR  2011   LITHOTRIPSY     POLYPECTOMY  04/16/2018   Procedure: POLYPECTOMY;  Surgeon: Daneil Dolin, MD;  [facility];  Service: Endoscopy;;  (colon)   S/P Hysterectomy  10/1996   with bilat SOO seconardt to fibroids   thyroid ablation  1986    Prior to Admission medications   Medication Sig Start Date End Date Taking? Authorizing Provider  anastrozole (ARIMIDEX) 1 MG tablet Take 1 tablet (1 mg total) by mouth daily. Start  06/08/2020 03/08/21  Yes Magrinat, Virgie Dad, MD  aspirin 81 MG EC tablet Take 81 mg by mouth 3 (three) times a week. Swallow whole.   Yes [provider]  atorvastatin (LIPITOR) 40 MG tablet TAKE 1 TABLET(40 MG) BY MOUTH DAILY Patient taking differently: Take 40 mg by mouth every other day. 11/24/21  Yes Nida, Marella Chimes, MD  cloNIDine (CATAPRES) 0.2 MG tablet TAKE 1 TABLET(0.2 MG) BY MOUTH TWICE DAILY 12/28/21  Yes Nida, Marella Chimes, MD  furosemide (LASIX) 20 MG tablet Take 1 tablet (20 mg total) by mouth daily as needed. 06/12/20  Yes [place], Modena Nunnery, MD  levothyroxine (SYNTHROID) 125 MCG tablet Take 1 tablet (125 mcg total) by mouth daily before breakfast. 11/03/21 05/02/22 Yes Nida, Marella Chimes, MD  losartan (COZAAR) 100 MG tablet Take 1 tablet (100 mg total) by mouth daily. 09/29/21  Yes Lindell Spar, MD  metFORMIN (GLUCOPHAGE) 500 MG tablet TAKE 1 TABLET BY MOUTH TWICE DAILY WITH A MEAL Patient taking differently: Take 500 mg by mouth daily. 06/12/20  Yes [place], Modena Nunnery, MD  Multiple Vitamin (MULTIVITAMIN WITH MINERALS) TABS tablet Take 1 tablet by mouth daily.   Yes [provider]  potassium chloride SA (KLOR-CON M) 20 MEQ tablet TAKE 1 TABLET BY MOUTH DAILY Patient taking differently: Take 20 mEq by mouth daily  as needed (when urinating frequently). 05/19/21  Yes Cassandria Anger, MD  spironolactone (ALDACTONE) 25 MG tablet TAKE 1 TABLET BY MOUTH EVERY MORNING 12/27/21  Yes Brita Romp, NP  Blood Glucose Monitoring Suppl (BLOOD GLUCOSE MONITOR SYSTEM) W/DEVICE KIT Please dispense based on patient and insurance preference. Use to monitor fasting blood sugar 1x daily. Dx: E11.9 10/09/14   Alycia Rossetti, MD  Glucose Blood (BLOOD GLUCOSE TEST STRIPS) STRP Please dispense based on patient and insurance preference. Use to monitor fasting blood sugar 1x daily. Dx: E11.9 01/29/16   Alycia Rossetti, MD  Lancets 28G MISC Please dispense based on patient and  insurance preference. Use to monitor fasting blood sugar 1x daily. Dx: E11.9 10/09/14   Alycia Rossetti, MD  terbinafine (LAMISIL) 250 MG tablet Take 1 tablet (250 mg total) by mouth daily. 09/29/21   Lindell Spar, MD    Allergies as of 01/03/2022   (No Known Allergies)    Family History  Problem Relation Age of Onset   Colon cancer Mother        d. 74   Cancer Mother        metastisis sarcoma   Colon cancer Sister 86   Head & neck cancer Sister        roof of mouth   Colon polyps Sister    Diabetes Father    Hypertension Father    Cancer Father    Lung cancer Father        d. 32   Colon polyps Sister    Esophageal cancer Brother    Cancer Brother        cancer of eye   Aneurysm Maternal Aunt        brain   Cancer Maternal Uncle        NOS   COPD Maternal Uncle    Stomach cancer Maternal Grandfather 76   Heart attack Maternal Grandfather    Pancreatic cancer Paternal Grandmother        d. 10   Cancer Maternal Uncle        NOS - mother's pat 1/2 brother   Breast cancer Cousin        3 maternal cousins with breast cancer, some under 86, all different branches   Colon cancer Cousin        five mat first cousins, 32 were siblings   Cancer Cousin        2 mat 1/2 cousins with NOS cancer    Social History   Socioeconomic History   Marital status: Married    Spouse name: Not on file   Number of children: Not on file   Years of education: Not on file   Highest education level: Not on file  Occupational History   Not on file  Tobacco Use   Smoking status: Former    Packs/day: 0.75    Years: 10.00    Total pack years: 7.50    Types: Cigarettes    Start date: 01/30/1969    Quit date: 05/23/1988    Years since quitting: 33.7   Smokeless tobacco: Never  Vaping Use   Vaping Use: Never used  Substance and Sexual Activity   Alcohol use: No    Alcohol/week: 0.0 standard drinks of alcohol   Drug use: No   Sexual activity: Yes  Other Topics Concern   Not on file   Social History Narrative   Not on file   Social Determinants of Health  Financial Resource Strain: Low Risk  (12/08/2021)   Overall Financial Resource Strain (CARDIA)    Difficulty of Paying Living Expenses: Not hard at all  Food Insecurity: No Food Insecurity (12/08/2021)   Hunger Vital Sign    Worried About Running Out of Food in the Last Year: Never true    Ran Out of Food in the Last Year: Never true  Transportation Needs: No Transportation Needs (12/08/2021)   PRAPARE - Hydrologist (Medical): No    Lack of Transportation (Non-Medical): No  Physical Activity: Inactive (12/08/2021)   Exercise Vital Sign    Days of Exercise per Week: 0 days    Minutes of Exercise per Session: 0 min  Stress: No Stress Concern Present (12/08/2021)   [place]    Feeling of Stress : Not at all  Social Connections: Moderately Integrated (12/08/2021)   Social Connection and Isolation Panel [NHANES]    Frequency of Communication with Friends and Family: More than three times a week    Frequency of Social Gatherings with Friends and Family: More than three times a week    Attends Religious Services: More than 4 times per year    Active Member of [employer] or Organizations: No    Attends Archivist Meetings: Never    Marital Status: Married  Human resources officer Violence: Not At Risk (12/08/2021)   Humiliation, Afraid, Rape, and Kick questionnaire    Fear of Current or Ex-Partner: No    Emotionally Abused: No    Physically Abused: No    Sexually Abused: No    Review of Systems: See HPI, otherwise negative ROS  Physical Exam: BP (!) 203/95   Pulse 86   Temp 98.3 F (36.8 C) (Oral)   Resp (!) 22   Ht '5\' 2"'  (1.575 m)   Wt 110.5 kg   SpO2 96%   BMI 44.56 kg/m  General:   Alert,  Well-developed, well-nourished, pleasant and cooperative in NAD Neck:  Supple; no masses or thyromegaly. No significant  cervical adenopathy. Lungs:  Clear throughout to auscultation.   No wheezes, crackles, or rhonchi. No acute distress. Heart:  Regular rate and rhythm; no murmurs, clicks, rubs,  or gallops. Abdomen: Non-distended, normal bowel sounds.  Soft and nontender without appreciable mass or hepatosplenomegaly.  Pulses:  Normal pulses noted. Extremities:  Without clubbing or edema.  Impression/Plan:    66 year old lady with a positive family history colon cancer personal history of multiple colonic adenomas removed 2019-here for surveillance colonoscopy per plan.   I have offered the patient a surveillance colonoscopy per plan. The risks, benefits, limitations, alternatives and imponderables have been reviewed with the patient. Questions have been answered. All parties are agreeable.       Notice: This dictation was prepared with Dragon dictation along with smaller phrase technology. Any transcriptional errors that result from this process are unintentional and may not be corrected upon review.

## 2022-01-26 NOTE — Anesthesia Postprocedure Evaluation (Signed)
Anesthesia Post Note  Patient: Frances Hughes  Procedure(s) Performed: COLONOSCOPY WITH PROPOFOL POLYPECTOMY  Patient location during evaluation: Phase II Anesthesia Type: General Level of consciousness: awake and alert and oriented Pain management: pain level controlled Vital Signs Assessment: post-procedure vital signs reviewed and stable Respiratory status: spontaneous breathing, nonlabored ventilation and respiratory function stable Cardiovascular status: blood pressure returned to baseline and stable Postop Assessment: no apparent nausea or vomiting Anesthetic complications: no   No notable events documented.   Last Vitals:  Vitals:   01/26/22 0943 01/26/22 1114  BP: (!) 203/95 (!) 113/48  Pulse: 86 66  Resp: (!) 22 (!) 23  Temp: 36.8 C (!) 36.3 C  SpO2: 96% 93%    Last Pain:  Vitals:   01/26/22 1114  TempSrc: Axillary  PainSc: 0-No pain                 Hafsa Lohn C Thresia Ramanathan

## 2022-01-27 ENCOUNTER — Encounter: Payer: Self-pay | Admitting: Internal Medicine

## 2022-01-27 LAB — SURGICAL PATHOLOGY

## 2022-02-02 ENCOUNTER — Encounter: Payer: Medicare Other | Admitting: Internal Medicine

## 2022-02-03 ENCOUNTER — Encounter (HOSPITAL_COMMUNITY): Payer: Self-pay | Admitting: Internal Medicine

## 2022-02-06 ENCOUNTER — Other Ambulatory Visit: Payer: Self-pay | Admitting: Internal Medicine

## 2022-02-06 ENCOUNTER — Other Ambulatory Visit: Payer: Self-pay | Admitting: "Endocrinology

## 2022-02-06 DIAGNOSIS — E782 Mixed hyperlipidemia: Secondary | ICD-10-CM

## 2022-02-06 DIAGNOSIS — I10 Essential (primary) hypertension: Secondary | ICD-10-CM

## 2022-02-09 ENCOUNTER — Ambulatory Visit (INDEPENDENT_AMBULATORY_CARE_PROVIDER_SITE_OTHER): Payer: Medicare Other | Admitting: Internal Medicine

## 2022-02-09 ENCOUNTER — Encounter: Payer: Self-pay | Admitting: Internal Medicine

## 2022-02-09 VITALS — BP 154/102 | HR 69 | Ht 62.0 in | Wt 252.2 lb

## 2022-02-09 DIAGNOSIS — E119 Type 2 diabetes mellitus without complications: Secondary | ICD-10-CM

## 2022-02-09 DIAGNOSIS — Z0001 Encounter for general adult medical examination with abnormal findings: Secondary | ICD-10-CM

## 2022-02-09 DIAGNOSIS — G4733 Obstructive sleep apnea (adult) (pediatric): Secondary | ICD-10-CM

## 2022-02-09 DIAGNOSIS — D0512 Intraductal carcinoma in situ of left breast: Secondary | ICD-10-CM | POA: Diagnosis not present

## 2022-02-09 DIAGNOSIS — Z1231 Encounter for screening mammogram for malignant neoplasm of breast: Secondary | ICD-10-CM

## 2022-02-09 DIAGNOSIS — E890 Postprocedural hypothyroidism: Secondary | ICD-10-CM | POA: Diagnosis not present

## 2022-02-09 DIAGNOSIS — Z23 Encounter for immunization: Secondary | ICD-10-CM

## 2022-02-09 DIAGNOSIS — I10 Essential (primary) hypertension: Secondary | ICD-10-CM | POA: Diagnosis not present

## 2022-02-09 MED ORDER — SPIRONOLACTONE 25 MG PO TABS: 25.0000 mg | ORAL_TABLET | Freq: Every morning | ORAL | 1 refills | Status: DC

## 2022-02-09 MED ORDER — AMLODIPINE BESYLATE 5 MG PO TABS: 5.0000 mg | ORAL_TABLET | Freq: Every day | ORAL | 1 refills | Status: DC

## 2022-02-09 MED ORDER — CLONIDINE HCL 0.2 MG PO TABS: 0.2000 mg | ORAL_TABLET | Freq: Two times a day (BID) | ORAL | 1 refills | Status: DC

## 2022-02-09 NOTE — Progress Notes (Signed)
Established Patient Office Visit  Subjective:  [name], female    DOB: Feb 04, 1956  Age: 66 y.o. MRN: [id number]  CC:  Chief Complaint  Patient presents with   Annual Exam    CPE bp concerns patient states Dr Dorris Fetch cut back on metformin and synthroid patient states Frances Hughes's so tired, emotional stress    HPI SHEKETA ENDE is a 66 y.o. female with past medical history of type II DM, HTN, HLD, peripheral neuropathy, adrenal adenoma, breast cancer, hypothyroidism and morbid obesity who presents for annual physical.  HTN: BP is elevated today.  Frances Hughes reports that Frances Hughes BP remains elevated at home as well.  Frances Hughes is on losartan 100 mg daily, spironolactone 25 mg daily and clonidine 0.2 mg twice daily currently.  Frances Hughes was on amlodipine, which was discontinued recently to reduce pill burden.  Initially Frances Hughes BP was WNL, but has been running high lately.  Frances Hughes denies any chest pain, dyspnea or palpitations.  Type II DM: Frances Hughes metformin dose was decreased to 500 mg daily.  Frances Hughes HbA1c was 6.0 in 06/23, followed by Dr. Dorris Fetch.  Frances Hughes reports worsening of fatigue.  Denies any polyuria or polyphagia currently.  Hypothyroidism: Frances Hughes dose of levothyroxine was reduced to 125 mcg daily.  Frances Hughes has noticed weight gain and worsening of Frances Hughes fatigue since then.    Past Medical History:  Diagnosis Date   Adrenal mass, left ([place])    Breast cancer ([place])    Degenerative disc disease at L5-S1 level    Depression 07/15/2011   DM (diabetes mellitus) ([place])    Type II controlled   Enlarged heart    Family history of breast cancer    Family history of colon cancer    Family history of pancreatic cancer    Family history of stomach cancer    Grave's disease    History of kidney stones    HTN (hypertension)    Hyperlipidemia    Hypothyroidism    Lymphocytosis    Nephrolithiasis    OSA on CPAP     Past Surgical History:  Procedure Laterality Date   ABDOMINAL HYSTERECTOMY     BREAST BIOPSY Left  01/06/2020   BREAST LUMPECTOMY WITH RADIOACTIVE SEED LOCALIZATION Left 02/19/2020   Procedure: LEFT BREAST LUMPECTOMY WITH RADIOACTIVE SEED LOCALIZATION;  Surgeon: [name], MD;  Location: [facility];  Service: General;  Laterality: Left;   COLONOSCOPY  2011   normal colonoscopy   COLONOSCOPY WITH PROPOFOL N/A 04/16/2018   Procedure: COLONOSCOPY WITH PROPOFOL;  Surgeon: Daneil Dolin, MD;  [facility];  Service: Endoscopy;  Laterality: N/A;  9:45am   COLONOSCOPY WITH PROPOFOL N/A 01/26/2022   Procedure: COLONOSCOPY WITH PROPOFOL;  Surgeon: Daneil Dolin, MD;  [facility];  Service: Endoscopy;  Laterality: N/A;  11:15 am   HERNIA REPAIR  2011   LITHOTRIPSY     POLYPECTOMY  04/16/2018   Procedure: POLYPECTOMY;  Surgeon: Daneil Dolin, MD;  [facility];  Service: Endoscopy;;  (colon)   POLYPECTOMY  01/26/2022   Procedure: POLYPECTOMY;  Surgeon: Daneil Dolin, MD;  [facility];  Service: Endoscopy;;   S/P Hysterectomy  10/1996   with bilat SOO seconardt to fibroids   thyroid ablation  1986    Family History  Problem Relation Age of Onset   Colon cancer Mother        d. 37   Cancer Mother  metastisis sarcoma   Colon cancer Sister 90   Head & neck cancer Sister        roof of mouth   Colon polyps Sister    Diabetes Father    Hypertension Father    Cancer Father    Lung cancer Father        d. 8   Colon polyps Sister    Esophageal cancer Brother    Cancer Brother        cancer of eye   Aneurysm Maternal Aunt        brain   Cancer Maternal Uncle        NOS   COPD Maternal Uncle    Stomach cancer Maternal Grandfather 76   Heart attack Maternal Grandfather    Pancreatic cancer Paternal Grandmother        d. 47   Cancer Maternal Uncle        NOS - mother's pat 1/2 brother   Breast cancer Cousin        3 maternal cousins with breast cancer, some under 31, all different branches   Colon cancer  Cousin        five mat first cousins, 31 were siblings   Cancer Cousin        2 mat 1/2 cousins with NOS cancer    Social History   Socioeconomic History   Marital status: Married    Spouse name: Not on file   Number of children: Not on file   Years of education: Not on file   Highest education level: Not on file  Occupational History   Not on file  Tobacco Use   Smoking status: Former    Packs/day: 0.75    Years: 10.00    Total pack years: 7.50    Types: Cigarettes    Start date: 01/30/1969    Quit date: 05/23/1988    Years since quitting: 33.7   Smokeless tobacco: Never  Vaping Use   Vaping Use: Never used  Substance and Sexual Activity   Alcohol use: No    Alcohol/week: 0.0 standard drinks of alcohol   Drug use: No   Sexual activity: Yes  Other Topics Concern   Not on file  Social History Narrative   Not on file   Social Determinants of Health   Financial Resource Strain: Low Risk  (12/08/2021)   Overall Financial Resource Strain (CARDIA)    Difficulty of Paying Living Expenses: Not hard at all  Food Insecurity: No Food Insecurity (12/08/2021)   Hunger Vital Sign    Worried About Running Out of Food in the Last Year: Never true    Ran Out of Food in the Last Year: Never true  Transportation Needs: No Transportation Needs (12/08/2021)   PRAPARE - Hydrologist (Medical): No    Lack of Transportation (Non-Medical): No  Physical Activity: Inactive (12/08/2021)   Exercise Vital Sign    Days of Exercise per Week: 0 days    Minutes of Exercise per Session: 0 min  Stress: No Stress Concern Present (12/08/2021)   [place]    Feeling of Stress : Not at all  Social Connections: Moderately Integrated (12/08/2021)   Social Connection and Isolation Panel [NHANES]    Frequency of Communication with Friends and Family: More than three times a week    Frequency of Social Gatherings  with Friends and Family: More than three times a week  Attends Religious Services: More than 4 times per year    Active Member of Clubs or Organizations: No    Attends Archivist Meetings: Never    Marital Status: Married  Human resources officer Violence: Not At Risk (12/08/2021)   Humiliation, Afraid, Rape, and Kick questionnaire    Fear of Current or Ex-Partner: No    Emotionally Abused: No    Physically Abused: No    Sexually Abused: No    Outpatient Medications Prior to Visit  Medication Sig Dispense Refill   anastrozole (ARIMIDEX) 1 MG tablet Take 1 tablet (1 mg total) by mouth daily. Start 06/08/2020 90 tablet 4   aspirin 81 MG EC tablet Take 81 mg by mouth 3 (three) times a week. Swallow whole.     atorvastatin (LIPITOR) 40 MG tablet TAKE 1 TABLET(40 MG) BY MOUTH DAILY 90 tablet 0   Blood Glucose Monitoring Suppl (BLOOD GLUCOSE MONITOR SYSTEM) W/DEVICE KIT Please dispense based on patient and insurance preference. Use to monitor fasting blood sugar 1x daily. Dx: E11.9 1 each 0   furosemide (LASIX) 20 MG tablet Take 1 tablet (20 mg total) by mouth daily as needed. 30 tablet 1   Glucose Blood (BLOOD GLUCOSE TEST STRIPS) STRP Please dispense based on patient and insurance preference. Use to monitor fasting blood sugar 1x daily. Dx: E11.9 100 each 0   Lancets 28G MISC Please dispense based on patient and insurance preference. Use to monitor fasting blood sugar 1x daily. Dx: E11.9 100 each 0   levothyroxine (SYNTHROID) 125 MCG tablet Take 1 tablet (125 mcg total) by mouth daily before breakfast. 90 tablet 1   losartan (COZAAR) 100 MG tablet TAKE 1 TABLET(100 MG) BY MOUTH DAILY 90 tablet 1   metFORMIN (GLUCOPHAGE) 500 MG tablet TAKE 1 TABLET BY MOUTH TWICE DAILY WITH A MEAL (Patient taking differently: Take 500 mg by mouth daily.) 180 tablet 1   Multiple Vitamin (MULTIVITAMIN WITH MINERALS) TABS tablet Take 1 tablet by mouth daily.     potassium chloride SA (KLOR-CON M) 20 MEQ tablet  TAKE 1 TABLET BY MOUTH DAILY (Patient taking differently: Take 20 mEq by mouth daily as needed (when urinating frequently).) 90 tablet 0   terbinafine (LAMISIL) 250 MG tablet Take 1 tablet (250 mg total) by mouth daily. 30 tablet 2   cloNIDine (CATAPRES) 0.2 MG tablet TAKE 1 TABLET(0.2 MG) BY MOUTH TWICE DAILY 180 tablet 0   spironolactone (ALDACTONE) 25 MG tablet TAKE 1 TABLET BY MOUTH EVERY MORNING 90 tablet 1   No facility-administered medications prior to visit.    No Known Allergies  ROS Review of Systems  Constitutional:  Positive for fatigue and unexpected weight change. Negative for chills and fever.  HENT:  Negative for congestion, sinus pressure, sinus pain and sore throat.   Eyes:  Negative for pain and discharge.  Respiratory:  Negative for cough and shortness of breath.   Cardiovascular:  Negative for chest pain and palpitations.  Gastrointestinal:  Negative for abdominal pain, diarrhea, nausea and vomiting.  Endocrine: Negative for polydipsia and polyuria.  Genitourinary:  Negative for dysuria and hematuria.  Musculoskeletal:  Negative for neck pain and neck stiffness.  Skin:  Negative for rash.  Neurological:  Negative for dizziness and weakness.  Psychiatric/Behavioral:  Negative for agitation and behavioral problems.       Objective:    Physical Exam Vitals reviewed.  Constitutional:      General: Frances Hughes is not in acute distress.    Appearance: Frances Hughes is  obese. Frances Hughes is not diaphoretic.  HENT:     Head: Normocephalic and atraumatic.     Nose: Nose normal.     Mouth/Throat:     Mouth: Mucous membranes are moist.  Eyes:     General: No scleral icterus.    Extraocular Movements: Extraocular movements intact.  Cardiovascular:     Rate and Rhythm: Normal rate and regular rhythm.     Pulses: Normal pulses.     Heart sounds: Normal heart sounds. No murmur heard. Pulmonary:     Breath sounds: Normal breath sounds. No wheezing or rales.  Abdominal:     Palpations:  Abdomen is soft.     Tenderness: There is no abdominal tenderness.  Musculoskeletal:     Cervical back: Neck supple. No tenderness.     Right lower leg: No edema.     Left lower leg: No edema.  Skin:    General: Skin is warm.     Findings: No rash.  Neurological:     General: No focal deficit present.     Mental Status: Frances Hughes is alert and oriented to person, place, and time.     Cranial Nerves: No cranial nerve deficit.     Sensory: No sensory deficit.     Motor: No weakness.  Psychiatric:        Mood and Affect: Mood normal.        Behavior: Behavior normal.     BP (!) 154/102 (BP Location: Left Arm, Cuff Size: Normal)   Pulse 69   Ht _0  (1.575 m)   Wt 252 lb 3.2 oz (114.4 kg)   SpO2 94%   BMI 46.13 kg/m  Wt Readings from Last 3 Encounters:  02/09/22 252 lb 3.2 oz (114.4 kg)  01/26/22 243 lb 9.7 oz (110.5 kg)  01/25/22 243 lb 9.7 oz (110.5 kg)    Lab Results  Component Value Date   TSH 1.000 10/27/2021   Lab Results  Component Value Date   WBC 6.6 06/12/2020   HGB 12.1 06/12/2020   HCT 37.8 06/12/2020   MCV 82.2 06/12/2020   PLT 254 06/12/2020   Lab Results  Component Value Date   NA 140 01/25/2022   K 3.9 01/25/2022   CO2 28 01/25/2022   GLUCOSE 98 01/25/2022   BUN 17 01/25/2022   CREATININE 0.78 01/25/2022   BILITOT 0.3 07/28/2021   ALKPHOS 111 07/28/2021   AST 15 07/28/2021   ALT 12 07/28/2021   PROT 8.2 (H) 07/28/2021   ALBUMIN 3.3 (L) 07/28/2021   CALCIUM 9.2 01/25/2022   ANIONGAP 7 01/25/2022   Lab Results  Component Value Date   CHOL 180 06/12/2020   Lab Results  Component Value Date   HDL 56 06/12/2020   Lab Results  Component Value Date   LDLCALC 107 (H) 06/12/2020   Lab Results  Component Value Date   TRIG 77 06/12/2020   Lab Results  Component Value Date   CHOLHDL 3.2 06/12/2020   Lab Results  Component Value Date   HGBA1C 6.0 11/03/2021      Assessment & Plan:   Problem List Items Addressed This Visit        Cardiovascular and Mediastinum   Essential hypertension, benign    BP Readings from Last 1 Encounters:  02/09/22 (!) 170/101  Uncontrolled with losartan 100 mg daily, spironolactone 25 mg daily and clonidine 0.2 mg twice daily will add amlodipine 5 mg Counseled for compliance with the medications Advised DASH diet and  moderate exercise/walking, at least 150 mins/week      Relevant Medications   amLODipine (NORVASC) 5 MG tablet   spironolactone (ALDACTONE) 25 MG tablet   cloNIDine (CATAPRES) 0.2 MG tablet     Respiratory   OSA (obstructive sleep apnea)    Uses CPAP        Endocrine   Diabetes mellitus, stable ([facility])    Lab Results  Component Value Date   HGBA1C 6.0 11/03/2021   On metformin 500 mg twice daily Advised to follow diabetic diet On statin and ARB F/u CMP and lipid panel Diabetic eye exam: Advised to follow up with Ophthalmology for diabetic eye exam      Hypothyroidism following radioiodine therapy    Lab Results  Component Value Date   TSH 1.000 10/27/2021  On levothyroxine 125 mcg QD Followed by Endocrinology        Other   Encounter for general adult medical examination with abnormal findings - Primary    Physical exam as documented. Counseling done  re healthy lifestyle involving commitment to 150 minutes exercise per week, heart healthy diet, and attaining healthy weight.The importance of adequate sleep also discussed. Changes in health habits are decided on by the patient with goals and time frames  set for achieving them. Immunization and cancer screening needs are specifically addressed at this visit.      Ductal carcinoma in situ (DCIS) of left breast    S/p left lumpectomy and adjuvant radiation On Anastrazole Followed by Oncology      Other Visit Diagnoses     Encounter for screening mammogram for malignant neoplasm of breast       Relevant Orders   MM DIAG BREAST TOMO BILATERAL   Need for immunization against influenza        Relevant Orders   Flu Vaccine QUAD High Dose(Fluad) (Completed)       Meds ordered this encounter  Medications   amLODipine (NORVASC) 5 MG tablet    Sig: Take 1 tablet (5 mg total) by mouth daily.    Dispense:  90 tablet    Refill:  1   spironolactone (ALDACTONE) 25 MG tablet    Sig: Take 1 tablet (25 mg total) by mouth every morning.    Dispense:  90 tablet    Refill:  1   cloNIDine (CATAPRES) 0.2 MG tablet    Sig: Take 1 tablet (0.2 mg total) by mouth 2 (two) times daily.    Dispense:  180 tablet    Refill:  1    Follow-up: Return in about 6 weeks (around 03/23/2022) for HTN.    Lindell Spar, MD

## 2022-02-09 NOTE — Assessment & Plan Note (Signed)
Lab Results  Component Value Date   HGBA1C 6.0 11/03/2021    On metformin 500 mg twice daily Advised to follow diabetic diet On statin and ARB F/u CMP and lipid panel Diabetic eye exam: Advised to follow up with Ophthalmology for diabetic eye exam

## 2022-02-09 NOTE — Patient Instructions (Addendum)
Please start taking Amlodipine as prescribed.  Please continue taking medications as prescribed.  Please follow DASH diet and perform moderate exercise/walking as tolerated.  Please consider getting Shingrix vaccine at local pharmacy.

## 2022-02-09 NOTE — Assessment & Plan Note (Signed)

## 2022-02-09 NOTE — Assessment & Plan Note (Signed)
S/p left lumpectomy and adjuvant radiation On Anastrazole Followed by Oncology

## 2022-02-09 NOTE — Assessment & Plan Note (Signed)
Uses CPAP 

## 2022-02-09 NOTE — Assessment & Plan Note (Signed)
Lab Results  Component Value Date   TSH 1.000 10/27/2021   On levothyroxine 125 mcg QD Followed by Endocrinology 

## 2022-02-09 NOTE — Assessment & Plan Note (Addendum)
BP Readings from Last 1 Encounters:  02/09/22 (!) 170/101   Uncontrolled with losartan 100 mg daily, spironolactone 25 mg daily and clonidine 0.2 mg twice daily will add amlodipine 5 mg Counseled for compliance with the medications Advised DASH diet and moderate exercise/walking, at least 150 mins/week

## 2022-03-01 ENCOUNTER — Encounter: Payer: Self-pay | Admitting: *Deleted

## 2022-03-01 ENCOUNTER — Ambulatory Visit: Payer: Self-pay | Admitting: *Deleted

## 2022-03-01 NOTE — Patient Outreach (Signed)
  Care Coordination   Initial Visit Note   03/01/2022  Name: TAJUANNA BURNETT MRN: [id number] DOB: [birth year]  Jolyn Lent is a 66 y.o. year old female who sees Lindell Spar, MD for primary care. I spoke with Jolyn Lent by phone today.  What matters to the patients health and wellness today?  No Interventions Identified.   SDOH assessments and interventions completed:  Yes.  SDOH Interventions Today    Flowsheet Row Most Recent Value  SDOH Interventions   Food Insecurity Interventions Intervention Not Indicated  Housing Interventions Intervention Not Indicated  Transportation Interventions Intervention Not Indicated  Utilities Interventions Intervention Not Indicated  Alcohol Usage Interventions Intervention Not Indicated (Score <7)  Financial Strain Interventions Intervention Not Indicated  Physical Activity Interventions Patient Refused  Stress Interventions Intervention Not Indicated  Social Connections Interventions Intervention Not Indicated     SDOH assessments and interventions completed:  Yes.   Care Coordination Interventions Activated:  Yes.    Care Coordination Interventions:  Yes, provided.    Follow up plan: No further intervention required.    Encounter Outcome:  Pt. visit completed.    Nat Christen, BSW, MSW, LCSW  Licensed Education officer, [facility]  Mailing [place] [address], [place], [place] [address] Physical Address-[address], [place], [place] [address] Toll Free Main # [phone] Fax # [phone] Cell # [phone] [email or web address]

## 2022-03-01 NOTE — Patient Instructions (Signed)
Visit Information  Thank you for taking time to visit with me today. Please don't hesitate to contact me if I can be of assistance to you.   Please call the care guide team at [phone] if you need to cancel or reschedule your appointment.   If you are experiencing a Mental Health or Behavioral Health Crisis or need someone to talk to, please call the Suicide and Crisis Lifeline: 988 call the [place] National Suicide Prevention Lifeline: 1-800-273-8255 or TTY: 1-800-799-4 TTY (1-800-799-4889) to talk to a trained counselor call 1-800-273-TALK (toll free, 24 hour hotline) go to [facility] [address], [place] ([phone]) call the [place] Crisis Line: 800-939-9988 call 911  Patient verbalizes understanding of instructions and care plan provided today and agrees to view in MyChart. Active MyChart status and patient understanding of how to access instructions and care plan via MyChart confirmed with patient.     No further follow up required.  Danne Scardina, BSW, MSW, LCSW  Licensed Clinical Social Worker  [facility]  Mailing Address-[address], [place], [place] [address] Physical Address-[address], [place], [place] [address] Toll Free Main # [phone] Fax # [phone] Cell # [phone] [email or web address]            

## 2022-03-02 ENCOUNTER — Ambulatory Visit
Admission: RE | Admit: 2022-03-02 | Discharge: 2022-03-02 | Disposition: A | Discharge status: Home / Self Care | Payer: Medicare Other | Source: Ambulatory Visit | Attending: Internal Medicine | Admitting: Internal Medicine

## 2022-03-02 DIAGNOSIS — Z853 Personal history of malignant neoplasm of breast: Secondary | ICD-10-CM | POA: Diagnosis not present

## 2022-03-02 DIAGNOSIS — Z1231 Encounter for screening mammogram for malignant neoplasm of breast: Secondary | ICD-10-CM

## 2022-03-07 ENCOUNTER — Other Ambulatory Visit: Payer: Self-pay | Admitting: *Deleted

## 2022-03-07 DIAGNOSIS — D0512 Intraductal carcinoma in situ of left breast: Secondary | ICD-10-CM

## 2022-03-08 ENCOUNTER — Inpatient Hospital Stay: Payer: Medicare Other | Attending: Hematology and Oncology

## 2022-03-08 ENCOUNTER — Inpatient Hospital Stay: Payer: Medicare Other | Admitting: Hematology and Oncology

## 2022-03-08 ENCOUNTER — Encounter: Payer: Self-pay | Admitting: Hematology and Oncology

## 2022-03-08 VITALS — BP 131/78 | HR 59 | Temp 97.9°F | Resp 16 | Ht 62.0 in | Wt 245.0 lb

## 2022-03-08 DIAGNOSIS — Z79811 Long term (current) use of aromatase inhibitors: Secondary | ICD-10-CM | POA: Insufficient documentation

## 2022-03-08 DIAGNOSIS — Z923 Personal history of irradiation: Secondary | ICD-10-CM | POA: Insufficient documentation

## 2022-03-08 DIAGNOSIS — Z170 Estrogen receptor positive status [ER+]: Secondary | ICD-10-CM | POA: Diagnosis not present

## 2022-03-08 DIAGNOSIS — E039 Hypothyroidism, unspecified: Secondary | ICD-10-CM | POA: Insufficient documentation

## 2022-03-08 DIAGNOSIS — Z7984 Long term (current) use of oral hypoglycemic drugs: Secondary | ICD-10-CM | POA: Insufficient documentation

## 2022-03-08 DIAGNOSIS — I10 Essential (primary) hypertension: Secondary | ICD-10-CM | POA: Insufficient documentation

## 2022-03-08 DIAGNOSIS — D0512 Intraductal carcinoma in situ of left breast: Secondary | ICD-10-CM

## 2022-03-08 DIAGNOSIS — E119 Type 2 diabetes mellitus without complications: Secondary | ICD-10-CM | POA: Insufficient documentation

## 2022-03-08 DIAGNOSIS — Z79899 Other long term (current) drug therapy: Secondary | ICD-10-CM | POA: Diagnosis not present

## 2022-03-08 DIAGNOSIS — C50919 Malignant neoplasm of unspecified site of unspecified female breast: Secondary | ICD-10-CM | POA: Diagnosis not present

## 2022-03-08 DIAGNOSIS — Z87891 Personal history of nicotine dependence: Secondary | ICD-10-CM | POA: Diagnosis not present

## 2022-03-08 LAB — CBC WITH DIFFERENTIAL (CANCER CENTER ONLY)
Abs Immature Granulocytes: 0.02 10*3/uL (ref 0.00–0.07)
Basophils Absolute: 0 10*3/uL (ref 0.0–0.1)
Basophils Relative: 1 %
Eosinophils Absolute: 0.1 10*3/uL (ref 0.0–0.5)
Eosinophils Relative: 2 %
HCT: 37.6 % (ref 36.0–46.0)
Hemoglobin: 12 g/dL (ref 12.0–15.0)
Immature Granulocytes: 0 %
Lymphocytes Relative: 29 %
Lymphs Abs: 2.4 10*3/uL (ref 0.7–4.0)
MCH: 26.6 pg (ref 26.0–34.0)
MCHC: 31.9 g/dL (ref 30.0–36.0)
MCV: 83.4 fL (ref 80.0–100.0)
Monocytes Absolute: 0.5 10*3/uL (ref 0.1–1.0)
Monocytes Relative: 6 %
Neutro Abs: 5.1 10*3/uL (ref 1.7–7.7)
Neutrophils Relative %: 62 %
Platelet Count: 223 10*3/uL (ref 150–400)
RBC: 4.51 MIL/uL (ref 3.87–5.11)
RDW: 14.6 % (ref 11.5–15.5)
WBC Count: 8.2 10*3/uL (ref 4.0–10.5)
nRBC: 0 % (ref 0.0–0.2)

## 2022-03-08 LAB — CMP (CANCER CENTER ONLY)
ALT: 9 U/L (ref 0–44)
AST: 12 U/L — ABNORMAL LOW (ref 15–41)
Albumin: 3.6 g/dL (ref 3.5–5.0)
Alkaline Phosphatase: 106 U/L (ref 38–126)
Anion gap: 3 — ABNORMAL LOW (ref 5–15)
BUN: 24 mg/dL — ABNORMAL HIGH (ref 8–23)
CO2: 33 mmol/L — ABNORMAL HIGH (ref 22–32)
Calcium: 9.3 mg/dL (ref 8.9–10.3)
Chloride: 104 mmol/L (ref 98–111)
Creatinine: 0.86 mg/dL (ref 0.44–1.00)
GFR, Estimated: >60 mL/min (ref >60)
Glucose, Bld: 140 mg/dL — ABNORMAL HIGH (ref 70–99)
Potassium: 3.8 mmol/L (ref 3.5–5.1)
Sodium: 140 mmol/L (ref 135–145)
Total Bilirubin: 0.4 mg/dL (ref 0.3–1.2)
Total Protein: 7.1 g/dL (ref 6.5–8.1)

## 2022-03-08 NOTE — Progress Notes (Signed)
[facility]  [phone] Fax:(336) [phone]     ID: Frances Hughes DOB: 1956-02-24  MR#: [id number]  DPO#:[id number]  Patient Care Team: Lindell Spar, MD as PCP - General (Internal Medicine) Harl Bowie, Alphonse Guild, MD as PCP - Cardiology (Cardiology) Gala Romney Cristopher Estimable, MD as Consulting Physician (Gastroenterology) Mauro Kaufmann, RN as Oncology Nurse Navigator Rockwell Germany, RN as Oncology Nurse Navigator Magrinat, Virgie Dad, MD (Inactive) as Consulting Physician (Oncology) Kyung Rudd, MD as Consulting Physician (Radiation Oncology) [name], MD as Consulting Physician (General Surgery) Benay Pike, MD OTHER MD:  CHIEF COMPLAINT: noninvasive breast cancer, estrogen receptor positive  CURRENT TREATMENT: anastrozole  INTERVAL HISTORY: Frances Hughes returns today for follow up of her noninvasive breast cancer.  She is accompanied by her husband  She started anastrozole on 06/08/2020.  She tells me that she has not been taking gabapentin for hot flashes although she refill this medication.  She had a mammogram recently which had good report.  She tells me that her primary care provider recently titrated some of her blood pressure medication, diabetes and thyroid medication since then she has noticed that she has been a bit more fatigued, has gained some weight and wondered if this could be because of the medication change.  She otherwise has degenerative disc disease hence she cannot exercise although she is considering some water aerobics.  Rest of the pertinent 10 point ROS reviewed and negative   COVID 19 VACCINATION STATUS: fully vaccinated Levan Hughes), x4 as of October 2022; had COVID November 2020   HISTORY OF CURRENT ILLNESS: From the original intake note:  Frances Hughes had routine screening mammography on 12/12/2019 showing a possible abnormality in the left breast. She underwent left diagnostic mammography with tomography at [facility] on  12/24/2019 showing: breast density category B; upper-outer left breast calcifications spanning 1.5 cm.  Accordingly on 01/06/2020 she proceeded to biopsy of the left breast area in question. The pathology from this procedure ([id number]) showed: ductal carcinoma in situ, low grade, with calcifications. Prognostic indicators significant for: estrogen receptor, 100% positive with strong staining intensity and progesterone receptor, 60% positive with weak staining intensity.   She opted to proceed with lumpectomy on 02/19/2020 under Dr. Ninfa Linden. Pathology from the procedure [id number]) showed: ductal carcinoma in situ, low to intermediate grade, 1.5 cm, with calcifications and focal necrosis; lobular neoplasia; margins not involved.  The patient's subsequent history is as detailed below.   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   Enlarged heart    Family history of breast cancer    Family history of colon cancer    Family history of pancreatic cancer    Family history of stomach cancer    Grave's disease    History of Hughes stones    HTN (hypertension)    Hyperlipidemia    Hypothyroidism    Lymphocytosis    Nephrolithiasis    OSA on CPAP     PAST SURGICAL HISTORY: Past Surgical History:  Procedure Laterality Date   ABDOMINAL HYSTERECTOMY     BREAST BIOPSY Left 01/06/2020   BREAST LUMPECTOMY WITH RADIOACTIVE SEED LOCALIZATION Left 02/19/2020   Procedure: LEFT BREAST LUMPECTOMY WITH RADIOACTIVE SEED LOCALIZATION;  Surgeon: [name], MD;  Location: [facility];  Service: General;  Laterality: Left;   COLONOSCOPY  2011   normal colonoscopy   COLONOSCOPY WITH PROPOFOL N/A 04/16/2018   Procedure: COLONOSCOPY WITH PROPOFOL;  Surgeon: Daneil Dolin, MD;  [facility];  Service: Endoscopy;  Laterality: N/A;  9:45am   COLONOSCOPY WITH PROPOFOL N/A 01/26/2022   Procedure: COLONOSCOPY WITH PROPOFOL;  Surgeon: Daneil Dolin, MD;  [facility];  Service: Endoscopy;  Laterality: N/A;  11:15 am   HERNIA REPAIR  2011   LITHOTRIPSY     POLYPECTOMY  04/16/2018   Procedure: POLYPECTOMY;  Surgeon: Daneil Dolin, MD;  [facility];  Service: Endoscopy;;  (colon)   POLYPECTOMY  01/26/2022   Procedure: POLYPECTOMY;  Surgeon: Daneil Dolin, MD;  [facility];  Service: Endoscopy;;   S/P Hysterectomy  10/1996   with bilat SOO seconardt to fibroids   thyroid ablation  1986    FAMILY HISTORY: Family History  Problem Relation Age of Onset   Colon cancer Mother        d. 53   Cancer Mother        metastisis sarcoma   Colon cancer Sister 37   Head & neck cancer Sister        roof of mouth   Colon polyps Sister    Diabetes Father    Hypertension Father    Cancer Father    Lung cancer Father        d. 63   Colon polyps Sister    Esophageal cancer Brother    Cancer Brother        cancer of eye   Aneurysm Maternal Aunt        brain   Cancer Maternal Uncle        NOS   COPD Maternal Uncle    Stomach cancer Maternal Grandfather 76   Heart attack Maternal Grandfather    Pancreatic cancer Paternal Grandmother        d. 7   Cancer Maternal Uncle        NOS - mother's pat 1/2 brother   Breast cancer Cousin        3 maternal cousins with breast cancer, some under 9, all different branches   Colon cancer Cousin        five mat first cousins, 66 were siblings   Cancer Cousin        2 mat 1/2 cousins with NOS cancer  The patient's father died from lung cancer at the age of 38.  He worked in [place] and also smoked.  The patient's mother had "cancer all over her abdomen" and died from bowel perforation.  The patient tells me she has 4 cousins with colon cancer and a sister who died from colon cancer.  One brother died  from esophageal cancer.   GYNECOLOGIC HISTORY:  No LMP recorded. Patient has had a hysterectomy. Menarche: 67 years old Age at first live birth: 66 years old GX P 4 HRT no  Hysterectomy? Yes, 10/1996, due to fibroids BSO? 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  Her husband Frances Hughes is retired from [employer].  They have twin daughters, Frances Hughes who works as an Web designer in [employer] and Frances Hughes who works in [place] for the [employer] and [employer], both 63 years old; 3d daughter is Frances Hughes who works in Psychologist, educational, age 27; their  son Frances Hughes died from "colloid cysts in the brain" at the age of 25.  The patient has 6 grandchildren.  She is a Psychologist, forensic    ADVANCED DIRECTIVES: In the absence of any documents to the contrary the patient's husband is her healthcare power of attorney.   HEALTH MAINTENANCE: Social History   Tobacco Use   Smoking status: Former    Packs/day: 0.75    Years: 10.00    Total pack years: 7.50    Types: Cigarettes    Start date: 01/30/1969    Quit date: 05/23/1988    Years since quitting: 33.8    Passive exposure: Past   Smokeless tobacco: Never  Vaping Use   Vaping Use: Never used  Substance Use Topics   Alcohol use: No    Alcohol/week: 0.0 standard drinks of alcohol   Drug use: No     Colonoscopy: 03/2018 (Dr. Gala Romney), repeat recommended 2022  PAP: 01/2012, negative  Bone density: none on file   No Known Allergies  Current Outpatient Medications  Medication Sig Dispense Refill   amLODipine (NORVASC) 5 MG tablet Take 1 tablet (5 mg total) by mouth daily. 90 tablet 1   anastrozole (ARIMIDEX) 1 MG tablet Take 1 tablet (1 mg total) by mouth daily. Start 06/08/2020 90 tablet 4   aspirin 81 MG EC tablet Take 81 mg by mouth 3 (three) times a week. Swallow whole.     atorvastatin (LIPITOR) 40 MG tablet TAKE 1 TABLET(40 MG) BY MOUTH DAILY 90 tablet 0   Blood  Glucose Monitoring Suppl (BLOOD GLUCOSE MONITOR SYSTEM) W/DEVICE KIT Please dispense based on patient and insurance preference. Use to monitor fasting blood sugar 1x daily. Dx: E11.9 1 each 0   cloNIDine (CATAPRES) 0.2 MG tablet Take 1 tablet (0.2 mg total) by mouth 2 (two) times daily. 180 tablet 1   furosemide (LASIX) 20 MG tablet Take 1 tablet (20 mg total) by mouth daily as needed. 30 tablet 1   Glucose Blood (BLOOD GLUCOSE TEST STRIPS) STRP Please dispense based on patient and insurance preference. Use to monitor fasting blood sugar 1x daily. Dx: E11.9 100 each 0   Lancets 28G MISC Please dispense based on patient and insurance preference. Use to monitor fasting blood sugar 1x daily. Dx: E11.9 100 each 0   levothyroxine (SYNTHROID) 125 MCG tablet Take 1 tablet (125 mcg total) by mouth daily before breakfast. 90 tablet 1   losartan (COZAAR) 100 MG tablet TAKE 1 TABLET(100 MG) BY MOUTH DAILY 90 tablet 1   metFORMIN (GLUCOPHAGE) 500 MG tablet TAKE 1 TABLET BY MOUTH TWICE DAILY WITH A MEAL (Patient taking differently: Take 500 mg by mouth daily.) 180 tablet 1   Multiple Vitamin (MULTIVITAMIN WITH MINERALS) TABS tablet Take 1 tablet by mouth daily.     potassium chloride SA (KLOR-CON M) 20 MEQ tablet TAKE 1 TABLET BY MOUTH DAILY (Patient taking differently: Take 20 mEq by mouth daily as needed (when urinating frequently).) 90 tablet 0   spironolactone (ALDACTONE) 25 MG tablet Take 1 tablet (25 mg total) by mouth every morning. 90 tablet 1   terbinafine (LAMISIL) 250 MG tablet Take 1 tablet (250 mg total) by mouth daily. 30 tablet 2   No current facility-administered medications for this visit.    OBJECTIVE: African-American woman who appears stated age  12:   03/08/22 1311  BP: 131/78  Pulse: (!) 59  Resp: 16  Temp: 97.9 F (36.6 C)  SpO2: 96%      Body mass  index is 44.81 kg/m.   Wt Readings from Last 3 Encounters:  03/08/22 245 lb (111.1 kg)  02/09/22 252 lb 3.2 oz (114.4 kg)   01/26/22 243 lb 9.7 oz (110.5 kg)     ECOG FS:2 - Symptomatic, <50% confined to bed  Physical Exam Constitutional:      Appearance: Normal appearance.  Chest:     Comments: Asymmetry and given the left breast lumpectomy and postsurgical scarring noted in axillary region.  No other concerning palpable masses or regional adenopathy. Musculoskeletal:        General: No swelling.     Cervical back: Normal range of motion and neck supple. No rigidity.  Lymphadenopathy:     Cervical: No cervical adenopathy.  Neurological:     Mental Status: She is alert.       LAB RESULTS:  CMP     Component Value Date/Time   NA 140 01/25/2022 1214   K 3.9 01/25/2022 1214   CL 105 01/25/2022 1214   CO2 28 01/25/2022 1214   GLUCOSE 98 01/25/2022 1214   BUN 17 01/25/2022 1214   CREATININE 0.78 01/25/2022 1214   CREATININE 0.68 06/12/2020 0930   CALCIUM 9.2 01/25/2022 1214   PROT 8.2 (H) 07/28/2021 1115   ALBUMIN 3.3 (L) 07/28/2021 1115   AST 15 07/28/2021 1115   AST 17 03/02/2020 1526   ALT 12 07/28/2021 1115   ALT 13 03/02/2020 1526   ALKPHOS 111 07/28/2021 1115   BILITOT 0.3 07/28/2021 1115   BILITOT 0.3 03/02/2020 1526   GFRNONAA >60 01/25/2022 1214   GFRNONAA >60 03/02/2020 1526   GFRNONAA 94 11/07/2018 0927   GFRAA >60 02/17/2020 0930   GFRAA 109 11/07/2018 0927    No results found for: "TOTALPROTELP", "ALBUMINELP", "A1GS", "A2GS", "BETS", "BETA2SER", "GAMS", "MSPIKE", "SPEI"  Lab Results  Component Value Date   WBC 8.2 03/08/2022   NEUTROABS 5.1 03/08/2022   HGB 12.0 03/08/2022   HCT 37.6 03/08/2022   MCV 83.4 03/08/2022   PLT 223 03/08/2022    No results found for: "LABCA2"  No components found for: "XJOITG549"  No results for input(s): "INR" in the last 168 hours.  No results found for: "LABCA2"  No results found for: "IYM415"  No results found for: "CAN125"  No results found for: "CAN153"  No results found for: "CA2729"  No components found for:  "HGQUANT"  No results found for: "CEA1", "CEA" / No results found for: "CEA1", "CEA"   No results found for: "AFPTUMOR"  No results found for: "CHROMOGRNA"  No results found for: "KPAFRELGTCHN", "LAMBDASER", "KAPLAMBRATIO" (kappa/lambda light chains)  No results found for: "HGBA", "HGBA2QUANT", "HGBFQUANT", "HGBSQUAN" (Hemoglobinopathy evaluation)   No results found for: "LDH"  No results found for: "IRON", "TIBC", "IRONPCTSAT" (Iron and TIBC)  No results found for: "FERRITIN"  Urinalysis    Component Value Date/Time   COLORURINE YELLOW 07/17/2015 [place] 07/17/2015 1234   LABSPEC 1.020 07/17/2015 1234   PHURINE 6.5 07/17/2015 1234   GLUCOSEU NEGATIVE 07/17/2015 1234   HGBUR 2+ (A) 07/17/2015 1234   HGBUR trace-intact 06/13/2008 [place] 07/17/2015 1234   KETONESUR NEGATIVE 07/17/2015 1234   PROTEINUR NEGATIVE 07/17/2015 1234   UROBILINOGEN 0.2 06/13/2008 1045   NITRITE NEGATIVE 07/17/2015 1234   LEUKOCYTESUR NEGATIVE 07/17/2015 1234    STUDIES: MM DIAG BREAST TOMO BILATERAL  Result Date: 03/02/2022 CLINICAL DATA:  History of LEFT breast cancer in 2021 status post lumpectomy and radiation therapy. EXAM: DIGITAL DIAGNOSTIC  BILATERAL MAMMOGRAM WITH TOMOSYNTHESIS TECHNIQUE: Bilateral digital diagnostic mammography and breast tomosynthesis was performed. COMPARISON:  Previous exam(s). ACR Breast Density Category b: There are scattered areas of fibroglandular density. FINDINGS: There are stable postsurgical changes within the LEFT breast. There are no new dominant masses, suspicious calcifications or secondary signs of malignancy within either breast. IMPRESSION: No evidence of malignancy within either breast. Stable postsurgical changes within the LEFT breast. RECOMMENDATION: Per protocol, as the patient is now 2 or more years status post lumpectomy, she may return to annual screening mammography in 1 year. However, given the history of  breast cancer, the patient remains eligible for annual diagnostic mammography if preferred and annual diagnostic mammography is still recommended for next year in this case given the far posterior location of the surgical site in the upper LEFT breast. I have discussed the findings and recommendations with the patient. If applicable, a reminder letter will be sent to the patient regarding the next appointment. BI-RADS CATEGORY  2: Benign. Electronically Signed   By: Franki Cabot M.D.   On: 03/02/2022 10:51    ELIGIBLE FOR AVAILABLE RESEARCH PROTOCOL: Opted against COMET  ASSESSMENT: 66 y.o. [place], [place] woman status post left breast biopsy 01/06/2020 for ductal carcinoma in situ, grade 1, estrogen and progesterone receptor positive  (1) status post left lumpectomy 02/19/2020 for ductal carcinoma in situ grade 1 or 2, 1.5 cm, with negative margins.  (2) adjuvant radiation completed 04/27/2020  (3) started anastrozole 06/09/2019  (4) genetics counseling discussed--patient opts against testing   PLAN: Patient is here for follow up on adjuvant anastrozole. She is tolerating this very well except for some mild hot flashes.  She was prescribed gabapentin but she refused to use it.  She is today tells me that the hot flashes are tolerable.  She had a mammogram recently which did not show any evidence of recurrence.  Physical examination once again, postop changes with a lot of surgical scar near the axillary region but no palpable masses or nipple changes or regional adenopathy.  She will proceed with mammogram again in October 2024 and return for clinic visit.  With regards to the worsening fatigue and weight gain, I have asked her to of course discussed that titration of levothyroxine again with her primary care.  We have discussed that this is certainly a possibility contributing to her symptoms but fatigue is such a nonspecific symptom and it is hard to sometimes, the conclusion as to what exactly is  contributing to the fatigue.  Anastrozole can also contribute to some fatigue but she tells me specifically that these changes after the medications were titrated.  She expressed understanding of all the recommendations.  Self breast exam recommended monthly.  Return to clinic in 1 year Total time: 30 min *Total Encounter Time as defined by the Centers for Medicare and Medicaid Services includes, in addition to the face-to-face time of a patient visit (documented in the note above) non-face-to-face time: obtaining and reviewing outside history, ordering and reviewing medications, tests or procedures, care coordination (communications with other health care professionals or caregivers) and documentation in the medical record.

## 2022-03-09 ENCOUNTER — Other Ambulatory Visit: Payer: Medicare Other

## 2022-03-09 ENCOUNTER — Ambulatory Visit: Payer: Medicare Other | Admitting: Hematology and Oncology

## 2022-03-23 ENCOUNTER — Encounter: Payer: Self-pay | Admitting: Internal Medicine

## 2022-03-23 ENCOUNTER — Ambulatory Visit (INDEPENDENT_AMBULATORY_CARE_PROVIDER_SITE_OTHER): Payer: Medicare Other | Admitting: Internal Medicine

## 2022-03-23 VITALS — BP 136/84 | HR 60 | Resp 18 | Ht 63.0 in | Wt 250.4 lb

## 2022-03-23 DIAGNOSIS — I10 Essential (primary) hypertension: Secondary | ICD-10-CM | POA: Diagnosis not present

## 2022-03-23 DIAGNOSIS — E890 Postprocedural hypothyroidism: Secondary | ICD-10-CM | POA: Diagnosis not present

## 2022-03-23 NOTE — Patient Instructions (Signed)
Please continue taking medications as prescribed.  Please continue to follow DASH diet and perform moderate exercise/walking at least 150 mins/week.

## 2022-03-23 NOTE — Assessment & Plan Note (Signed)
Lab Results  Component Value Date   TSH 1.000 10/27/2021   On levothyroxine 125 mcg QD Followed by Endocrinology

## 2022-03-23 NOTE — Assessment & Plan Note (Signed)
Diet modification and moderate exercise/walking advised 

## 2022-03-23 NOTE — Progress Notes (Signed)
Established Patient Office Visit  Subjective:  [name], female    DOB: [birth year]  Age: 66 y.o. MRN: [id number]  CC:  Chief Complaint  Patient presents with   Follow-up    6 week follow up HTN bp at home has been running normal     HPI Frances Hughes is a 66 y.o. female with past medical history of type II DM, HTN, HLD, peripheral neuropathy, adrenal adenoma, breast cancer, hypothyroidism and morbid obesity who presents for f/u of her chronic medical conditions.  HTN: Her BP is well controlled now.  She has started taking amlodipine in addition to losartan 100 mg daily, spironolactone 25 mg daily and 0.2 mg twice daily currently.  She denies any headache, dizziness, chest pain, dyspnea or palpitations.  She has been trying to walk/exercise regularly, but has been difficult due to her leg pain from neuropathy.  She has been gaining weight lately, although her weight has been stable overall since the last visit.  Past Medical History:  Diagnosis Date   Adrenal mass, left ([place])    Breast cancer ([place])    Degenerative disc disease at L5-S1 level    Depression 07/15/2011   DM (diabetes mellitus) ([place])    Type II controlled   Enlarged heart    Family history of breast cancer    Family history of colon cancer    Family history of pancreatic cancer    Family history of stomach cancer    Grave's disease    History of kidney stones    HTN (hypertension)    Hyperlipidemia    Hypothyroidism    Lymphocytosis    Nephrolithiasis    OSA on CPAP     Past Surgical History:  Procedure Laterality Date   ABDOMINAL HYSTERECTOMY     BREAST BIOPSY Left 01/06/2020   BREAST LUMPECTOMY WITH RADIOACTIVE SEED LOCALIZATION Left 02/19/2020   Procedure: LEFT BREAST LUMPECTOMY WITH RADIOACTIVE SEED LOCALIZATION;  Surgeon: [name], MD;  Location: [place];  Service: General;  Laterality: Left;   COLONOSCOPY  2011   normal colonoscopy   COLONOSCOPY  WITH PROPOFOL N/A 04/16/2018   Procedure: COLONOSCOPY WITH PROPOFOL;  Surgeon: Daneil Dolin, MD;  [facility];  Service: Endoscopy;  Laterality: N/A;  9:45am   COLONOSCOPY WITH PROPOFOL N/A 01/26/2022   Procedure: COLONOSCOPY WITH PROPOFOL;  Surgeon: Daneil Dolin, MD;  [facility];  Service: Endoscopy;  Laterality: N/A;  11:15 am   HERNIA REPAIR  2011   LITHOTRIPSY     POLYPECTOMY  04/16/2018   Procedure: POLYPECTOMY;  Surgeon: Daneil Dolin, MD;  [facility];  Service: Endoscopy;;  (colon)   POLYPECTOMY  01/26/2022   Procedure: POLYPECTOMY;  Surgeon: Daneil Dolin, MD;  [facility];  Service: Endoscopy;;   S/P Hysterectomy  10/1996   with bilat SOO seconardt to fibroids   thyroid ablation  1986    Family History  Problem Relation Age of Onset   Colon cancer Mother        d. 53   Cancer Mother        metastisis sarcoma   Colon cancer Sister 27   Head & neck cancer Sister        roof of mouth   Colon polyps Sister    Diabetes Father    Hypertension Father    Cancer Father    Lung cancer Father  d. 40   Colon polyps Sister    Esophageal cancer Brother    Cancer Brother        cancer of eye   Aneurysm Maternal Aunt        brain   Cancer Maternal Uncle        NOS   COPD Maternal Uncle    Stomach cancer Maternal Grandfather 76   Heart attack Maternal Grandfather    Pancreatic cancer Paternal Grandmother        d. 71   Cancer Maternal Uncle        NOS - mother's pat 1/2 brother   Breast cancer Cousin        3 maternal cousins with breast cancer, some under 8, all different branches   Colon cancer Cousin        five mat first cousins, 71 were siblings   Cancer Cousin        2 mat 1/2 cousins with NOS cancer    Social History   Socioeconomic History   Marital status: Married    Spouse name: Enyla [place]   Number of children: Not on file   Years of education: 12   Highest education level: 12th grade   Occupational History   Not on file  Tobacco Use   Smoking status: Former    Packs/day: 0.75    Years: 10.00    Total pack years: 7.50    Types: Cigarettes    Start date: 01/30/1969    Quit date: 05/23/1988    Years since quitting: 33.8    Passive exposure: Past   Smokeless tobacco: Never  Vaping Use   Vaping Use: Never used  Substance and Sexual Activity   Alcohol use: No    Alcohol/week: 0.0 standard drinks of alcohol   Drug use: No   Sexual activity: Yes    Partners: Male  Other Topics Concern   Not on file  Social History Narrative   Not on file   Social Determinants of Health   Financial Resource Strain: Low Risk  (03/01/2022)   Overall Financial Resource Strain (CARDIA)    Difficulty of Paying Living Expenses: Not hard at all  Food Insecurity: No Food Insecurity (03/01/2022)   Hunger Vital Sign    Worried About Running Out of Food in the Last Year: Never true    [place] in the Last Year: Never true  Transportation Needs: No Transportation Needs (03/01/2022)   PRAPARE - Hydrologist (Medical): No    Lack of Transportation (Non-Medical): No  Physical Activity: Inactive (03/01/2022)   Exercise Vital Sign    Days of Exercise per Week: 0 days    Minutes of Exercise per Session: 0 min  Stress: No Stress Concern Present (03/01/2022)   Circle    Feeling of Stress : Not at all  Social Connections: Moderately Integrated (03/01/2022)   Social Connection and Isolation Panel [NHANES]    Frequency of Communication with Friends and Family: More than three times a week    Frequency of Social Gatherings with Friends and Family: More than three times a week    Attends Religious Services: More than 4 times per year    Active Member of [employer] or Organizations: No    Attends Archivist Meetings: Never    Marital Status: Married  Human resources officer Violence: Not At Risk  (03/01/2022)   Humiliation, Afraid, Rape, and Kick  questionnaire    Fear of Current or Ex-Partner: No    Emotionally Abused: No    Physically Abused: No    Sexually Abused: No    Outpatient Medications Prior to Visit  Medication Sig Dispense Refill   amLODipine (NORVASC) 5 MG tablet Take 1 tablet (5 mg total) by mouth daily. 90 tablet 1   anastrozole (ARIMIDEX) 1 MG tablet Take 1 tablet (1 mg total) by mouth daily. Start 06/08/2020 90 tablet 4   aspirin 81 MG EC tablet Take 81 mg by mouth 3 (three) times a week. Swallow whole.     atorvastatin (LIPITOR) 40 MG tablet TAKE 1 TABLET(40 MG) BY MOUTH DAILY 90 tablet 0   Blood Glucose Monitoring Suppl (BLOOD GLUCOSE MONITOR SYSTEM) W/DEVICE KIT Please dispense based on patient and insurance preference. Use to monitor fasting blood sugar 1x daily. Dx: E11.9 1 each 0   cloNIDine (CATAPRES) 0.2 MG tablet Take 1 tablet (0.2 mg total) by mouth 2 (two) times daily. 180 tablet 1   furosemide (LASIX) 20 MG tablet Take 1 tablet (20 mg total) by mouth daily as needed. 30 tablet 1   Glucose Blood (BLOOD GLUCOSE TEST STRIPS) STRP Please dispense based on patient and insurance preference. Use to monitor fasting blood sugar 1x daily. Dx: E11.9 100 each 0   Lancets 28G MISC Please dispense based on patient and insurance preference. Use to monitor fasting blood sugar 1x daily. Dx: E11.9 100 each 0   levothyroxine (SYNTHROID) 125 MCG tablet Take 1 tablet (125 mcg total) by mouth daily before breakfast. 90 tablet 1   losartan (COZAAR) 100 MG tablet TAKE 1 TABLET(100 MG) BY MOUTH DAILY 90 tablet 1   metFORMIN (GLUCOPHAGE) 500 MG tablet TAKE 1 TABLET BY MOUTH TWICE DAILY WITH A MEAL (Patient taking differently: Take 500 mg by mouth daily.) 180 tablet 1   Multiple Vitamin (MULTIVITAMIN WITH MINERALS) TABS tablet Take 1 tablet by mouth daily.     potassium chloride SA (KLOR-CON M) 20 MEQ tablet TAKE 1 TABLET BY MOUTH DAILY (Patient taking differently: Take 20 mEq by  mouth daily as needed (when urinating frequently).) 90 tablet 0   spironolactone (ALDACTONE) 25 MG tablet Take 1 tablet (25 mg total) by mouth every morning. 90 tablet 1   terbinafine (LAMISIL) 250 MG tablet Take 1 tablet (250 mg total) by mouth daily. 30 tablet 2   No facility-administered medications prior to visit.    No Known Allergies  ROS Review of Systems  Constitutional:  Positive for fatigue. Negative for chills and fever.  HENT:  Negative for congestion, sinus pressure, sinus pain and sore throat.   Eyes:  Negative for pain and discharge.  Respiratory:  Negative for cough and shortness of breath.   Cardiovascular:  Negative for chest pain and palpitations.  Gastrointestinal:  Negative for abdominal pain, diarrhea, nausea and vomiting.  Endocrine: Negative for polydipsia and polyuria.  Genitourinary:  Negative for dysuria and hematuria.  Musculoskeletal:  Negative for neck pain and neck stiffness.  Skin:  Negative for rash.  Neurological:  Negative for dizziness and weakness.  Psychiatric/Behavioral:  Negative for agitation and behavioral problems.       Objective:    Physical Exam Vitals reviewed.  Constitutional:      General: She is not in acute distress.    Appearance: She is obese. She is not diaphoretic.  HENT:     Head: Normocephalic and atraumatic.     Nose: Nose normal.     Mouth/Throat:  Mouth: Mucous membranes are moist.  Eyes:     General: No scleral icterus.    Extraocular Movements: Extraocular movements intact.  Cardiovascular:     Rate and Rhythm: Normal rate and regular rhythm.     Pulses: Normal pulses.     Heart sounds: Normal heart sounds. No murmur heard. Pulmonary:     Breath sounds: Normal breath sounds. No wheezing or rales.  Musculoskeletal:     Cervical back: Neck supple. No tenderness.     Right lower leg: No edema.     Left lower leg: No edema.  Skin:    General: Skin is warm.     Findings: No rash.  Neurological:      General: No focal deficit present.     Mental Status: She is alert and oriented to person, place, and time.     Sensory: No sensory deficit.     Motor: No weakness.  Psychiatric:        Mood and Affect: Mood normal.        Behavior: Behavior normal.     BP 136/84 (BP Location: Right Arm, Patient Position: Sitting, Cuff Size: Normal)   Pulse 60   Resp 18   Ht _0  (1.6 m)   Wt 250 lb 6.4 oz (113.6 kg)   SpO2 96%   BMI 44.36 kg/m  Wt Readings from Last 3 Encounters:  03/23/22 250 lb 6.4 oz (113.6 kg)  03/08/22 245 lb (111.1 kg)  02/09/22 252 lb 3.2 oz (114.4 kg)    Lab Results  Component Value Date   TSH 1.000 10/27/2021   Lab Results  Component Value Date   WBC 8.2 03/08/2022   HGB 12.0 03/08/2022   HCT 37.6 03/08/2022   MCV 83.4 03/08/2022   PLT 223 03/08/2022   Lab Results  Component Value Date   NA 140 03/08/2022   K 3.8 03/08/2022   CO2 33 (H) 03/08/2022   GLUCOSE 140 (H) 03/08/2022   BUN 24 (H) 03/08/2022   CREATININE 0.86 03/08/2022   BILITOT 0.4 03/08/2022   ALKPHOS 106 03/08/2022   AST 12 (L) 03/08/2022   ALT 9 03/08/2022   PROT 7.1 03/08/2022   ALBUMIN 3.6 03/08/2022   CALCIUM 9.3 03/08/2022   ANIONGAP 3 (L) 03/08/2022   Lab Results  Component Value Date   CHOL 180 06/12/2020   Lab Results  Component Value Date   HDL 56 06/12/2020   Lab Results  Component Value Date   LDLCALC 107 (H) 06/12/2020   Lab Results  Component Value Date   TRIG 77 06/12/2020   Lab Results  Component Value Date   CHOLHDL 3.2 06/12/2020   Lab Results  Component Value Date   HGBA1C 6.0 11/03/2021      Assessment & Plan:   Problem List Items Addressed This Visit       Cardiovascular and Mediastinum   Essential hypertension, benign - Primary    BP Readings from Last 1 Encounters:  03/23/22 136/84  Well-controlled with losartan 100 mg daily, amlodipine 5 mg, spironolactone 25 mg daily and clonidine 0.2 mg twice daily Counseled for compliance with the  medications Advised DASH diet and moderate exercise/walking, at least 150 mins/week        Endocrine   Hypothyroidism following radioiodine therapy    Lab Results  Component Value Date   TSH 1.000 10/27/2021  On levothyroxine 125 mcg QD Followed by Endocrinology        Other   Morbid obesity (  [place])    Diet modification and moderate exercise/walking advised       No orders of the defined types were placed in this encounter.   Follow-up: Return in about 5 months (around 08/22/2022) for HTN and DM.    Lindell Spar, MD

## 2022-03-23 NOTE — Assessment & Plan Note (Signed)
BP Readings from Last 1 Encounters:  03/23/22 136/84   Well-controlled with losartan 100 mg daily, amlodipine 5 mg, spironolactone 25 mg daily and clonidine 0.2 mg twice daily Counseled for compliance with the medications Advised DASH diet and moderate exercise/walking, at least 150 mins/week

## 2022-05-03 ENCOUNTER — Other Ambulatory Visit: Payer: Self-pay | Admitting: Internal Medicine

## 2022-05-03 DIAGNOSIS — B351 Tinea unguium: Secondary | ICD-10-CM

## 2022-05-03 LAB — COMPREHENSIVE METABOLIC PANEL
ALT: 10 IU/L (ref 0–32)
AST: 14 IU/L (ref 0–40)
Albumin/Globulin Ratio: 1.2 (ref 1.2–2.2)
Albumin: 3.7 g/dL — ABNORMAL LOW (ref 3.9–4.9)
Alkaline Phosphatase: 109 IU/L (ref 44–121)
BUN/Creatinine Ratio: 23 (ref 12–28)
BUN: 17 mg/dL (ref 8–27)
Bilirubin Total: 0.4 mg/dL (ref 0.0–1.2)
CO2: 28 mmol/L (ref 20–29)
Calcium: 9.2 mg/dL (ref 8.7–10.3)
Chloride: 103 mmol/L (ref 96–106)
Creatinine, Ser: 0.73 mg/dL (ref 0.57–1.00)
Globulin, Total: 3.1 g/dL (ref 1.5–4.5)
Glucose: 93 mg/dL (ref 70–99)
Potassium: 3.9 mmol/L (ref 3.5–5.2)
Sodium: 143 mmol/L (ref 134–144)
Total Protein: 6.8 g/dL (ref 6.0–8.5)
eGFR: 90 mL/min/{1.73_m2} (ref >59)

## 2022-05-03 LAB — LIPID PANEL
Chol/HDL Ratio: 3.1 ratio (ref 0.0–4.4)
Cholesterol, Total: 182 mg/dL (ref 100–199)
HDL: 59 mg/dL (ref >39)
LDL Chol Calc (NIH): 111 mg/dL — ABNORMAL HIGH (ref 0–99)
Triglycerides: 63 mg/dL (ref 0–149)
VLDL Cholesterol Cal: 12 mg/dL (ref 5–40)

## 2022-05-03 LAB — T4, FREE: Free T4: 1.49 ng/dL (ref 0.82–1.77)

## 2022-05-03 LAB — TSH: TSH: 1.98 u[IU]/mL (ref 0.450–4.500)

## 2022-05-05 ENCOUNTER — Encounter: Payer: Self-pay | Admitting: "Endocrinology

## 2022-05-05 ENCOUNTER — Ambulatory Visit: Payer: Medicare Other | Admitting: "Endocrinology

## 2022-05-05 VITALS — BP 136/82 | HR 68 | Ht 63.0 in | Wt 246.8 lb

## 2022-05-05 DIAGNOSIS — E119 Type 2 diabetes mellitus without complications: Secondary | ICD-10-CM | POA: Diagnosis not present

## 2022-05-05 DIAGNOSIS — I10 Essential (primary) hypertension: Secondary | ICD-10-CM

## 2022-05-05 DIAGNOSIS — E890 Postprocedural hypothyroidism: Secondary | ICD-10-CM

## 2022-05-05 DIAGNOSIS — E782 Mixed hyperlipidemia: Secondary | ICD-10-CM | POA: Diagnosis not present

## 2022-05-05 DIAGNOSIS — D3502 Benign neoplasm of left adrenal gland: Secondary | ICD-10-CM

## 2022-05-05 LAB — POCT GLYCOSYLATED HEMOGLOBIN (HGB A1C): HbA1c, POC (controlled diabetic range): 6 % (ref 0.0–7.0)

## 2022-05-05 MED ORDER — ATORVASTATIN CALCIUM 40 MG PO TABS: 40.0000 mg | ORAL_TABLET | Freq: Every evening | ORAL | 1 refills | Status: DC

## 2022-05-05 MED ORDER — METFORMIN HCL 500 MG PO TABS: 500.0000 mg | ORAL_TABLET | Freq: Every day | ORAL | 1 refills | Status: DC

## 2022-05-05 NOTE — Progress Notes (Signed)
05/05/2022, 6:58 PM  Endocrinology follow-up note   Subjective:    [name], female    DOB: [birth year], PCP Lindell Spar, MD   Past Medical History:  Diagnosis Date   Adrenal mass, left ([place])    Breast cancer ([place])    Degenerative disc disease at L5-S1 level    Depression 07/15/2011   DM (diabetes mellitus) ([place])    Type II controlled   Enlarged heart    Family history of breast cancer    Family history of colon cancer    Family history of pancreatic cancer    Family history of stomach cancer    Grave's disease    History of kidney stones    HTN (hypertension)    Hyperlipidemia    Hypothyroidism    Lymphocytosis    Nephrolithiasis    OSA on CPAP    Past Surgical History:  Procedure Laterality Date   ABDOMINAL HYSTERECTOMY     BREAST BIOPSY Left 01/06/2020   BREAST LUMPECTOMY WITH RADIOACTIVE SEED LOCALIZATION Left 02/19/2020   Procedure: LEFT BREAST LUMPECTOMY WITH RADIOACTIVE SEED LOCALIZATION;  Surgeon: [name], MD;  Location: [facility];  Service: General;  Laterality: Left;   COLONOSCOPY  2011   normal colonoscopy   COLONOSCOPY WITH PROPOFOL N/A 04/16/2018   Procedure: COLONOSCOPY WITH PROPOFOL;  Surgeon: Daneil Dolin, MD;  [facility];  Service: Endoscopy;  Laterality: N/A;  9:45am   COLONOSCOPY WITH PROPOFOL N/A 01/26/2022   Procedure: COLONOSCOPY WITH PROPOFOL;  Surgeon: Daneil Dolin, MD;  [facility];  Service: Endoscopy;  Laterality: N/A;  11:15 am   HERNIA REPAIR  2011   LITHOTRIPSY     POLYPECTOMY  04/16/2018   Procedure: POLYPECTOMY;  Surgeon: Daneil Dolin, MD;  [facility];  Service: Endoscopy;;  (colon)   POLYPECTOMY  01/26/2022   Procedure: POLYPECTOMY;  Surgeon: Daneil Dolin, MD;  [facility];  Service: Endoscopy;;   S/P Hysterectomy  10/1996   with bilat SOO seconardt to fibroids   thyroid  ablation  1986   Social History   Socioeconomic History   Marital status: Married    Spouse name: Jenasis Straley   Number of children: Not on file   Years of education: 12   Highest education level: 12th grade  Occupational History   Not on file  Tobacco Use   Smoking status: Former    Packs/day: 0.75    Years: 10.00    Total pack years: 7.50    Types: Cigarettes    Start date: 01/30/1969    Quit date: 05/23/1988    Years since quitting: 33.9    Passive exposure: Past   Smokeless tobacco: Never  Vaping Use   Vaping Use: Never used  Substance and Sexual Activity   Alcohol use: No    Alcohol/week: 0.0 standard drinks of alcohol   Drug use: No   Sexual activity: Yes    Partners: Male  Other Topics Concern   Not on file  Social History Narrative   Not on file   Social Determinants of Health   Financial Resource Strain: Low Risk  (03/01/2022)   Overall Financial  Resource Strain (CARDIA)    Difficulty of Paying Living Expenses: Not hard at all  Food Insecurity: No Food Insecurity (03/01/2022)   Hunger Vital Sign    Worried About Running Out of Food in the Last Year: Never true    Ran Out of Food in the Last Year: Never true  Transportation Needs: No Transportation Needs (03/01/2022)   PRAPARE - Hydrologist (Medical): No    Lack of Transportation (Non-Medical): No  Physical Activity: Inactive (03/01/2022)   Exercise Vital Sign    Days of Exercise per Week: 0 days    Minutes of Exercise per Session: 0 min  Stress: No Stress Concern Present (03/01/2022)   [place]    Feeling of Stress : Not at all  Social Connections: Moderately Integrated (03/01/2022)   Social Connection and Isolation Panel [NHANES]    Frequency of Communication with Friends and Family: More than three times a week    Frequency of Social Gatherings with Friends and Family: More than three times a week     Attends Religious Services: More than 4 times per year    Active Member of [employer] or Organizations: No    Attends Archivist Meetings: Never    Marital Status: Married   Family History  Problem Relation Age of Onset   Colon cancer Mother        d. 20   Cancer Mother        metastisis sarcoma   Colon cancer Sister 35   Head & neck cancer Sister        roof of mouth   Colon polyps Sister    Diabetes Father    Hypertension Father    Cancer Father    Lung cancer Father        d. 69   Colon polyps Sister    Esophageal cancer Brother    Cancer Brother        cancer of eye   Aneurysm Maternal Aunt        brain   Cancer Maternal Uncle        NOS   COPD Maternal Uncle    Stomach cancer Maternal Grandfather 76   Heart attack Maternal Grandfather    Pancreatic cancer Paternal Grandmother        d. 75   Cancer Maternal Uncle        NOS - mother's pat 1/2 brother   Breast cancer Cousin        3 maternal cousins with breast cancer, some under 81, all different branches   Colon cancer Cousin        five mat first cousins, 63 were siblings   Cancer Cousin        2 mat 1/2 cousins with NOS cancer   Outpatient Encounter Medications as of 05/05/2022  Medication Sig   amLODipine (NORVASC) 5 MG tablet Take 1 tablet (5 mg total) by mouth daily.   anastrozole (ARIMIDEX) 1 MG tablet Take 1 tablet (1 mg total) by mouth daily. Start 06/08/2020 (Patient not taking: Reported on 05/05/2022)   aspirin 81 MG EC tablet Take 81 mg by mouth 3 (three) times a week. Swallow whole.   atorvastatin (LIPITOR) 40 MG tablet Take 1 tablet (40 mg total) by mouth at bedtime.   Blood Glucose Monitoring Suppl (BLOOD GLUCOSE MONITOR SYSTEM) W/DEVICE KIT Please dispense based on patient and insurance preference. Use to monitor fasting blood sugar  1x daily. Dx: E11.9   cloNIDine (CATAPRES) 0.2 MG tablet Take 1 tablet (0.2 mg total) by mouth 2 (two) times daily.   furosemide (LASIX) 20 MG tablet Take 1 tablet  (20 mg total) by mouth daily as needed.   Glucose Blood (BLOOD GLUCOSE TEST STRIPS) STRP Please dispense based on patient and insurance preference. Use to monitor fasting blood sugar 1x daily. Dx: E11.9   Lancets 28G MISC Please dispense based on patient and insurance preference. Use to monitor fasting blood sugar 1x daily. Dx: E11.9   levothyroxine (SYNTHROID) 125 MCG tablet Take 1 tablet (125 mcg total) by mouth daily before breakfast.   losartan (COZAAR) 100 MG tablet TAKE 1 TABLET(100 MG) BY MOUTH DAILY   metFORMIN (GLUCOPHAGE) 500 MG tablet Take 1 tablet (500 mg total) by mouth daily.   Multiple Vitamin (MULTIVITAMIN WITH MINERALS) TABS tablet Take 1 tablet by mouth daily. (Patient not taking: Reported on 05/05/2022)   potassium chloride SA (KLOR-CON M) 20 MEQ tablet TAKE 1 TABLET BY MOUTH DAILY (Patient taking differently: Take 20 mEq by mouth daily as needed (when urinating frequently).)   spironolactone (ALDACTONE) 25 MG tablet Take 1 tablet (25 mg total) by mouth every morning.   terbinafine (LAMISIL) 250 MG tablet TAKE 1 TABLET(250 MG) BY MOUTH DAILY   [DISCONTINUED] atorvastatin (LIPITOR) 40 MG tablet TAKE 1 TABLET(40 MG) BY MOUTH DAILY (Patient not taking: Reported on 05/05/2022)   [DISCONTINUED] metFORMIN (GLUCOPHAGE) 500 MG tablet TAKE 1 TABLET BY MOUTH TWICE DAILY WITH A MEAL (Patient taking differently: Take 500 mg by mouth daily.)   No facility-administered encounter medications on file as of 05/05/2022.   ALLERGIES: No Known Allergies  VACCINATION STATUS: Immunization History  Administered Date(s) Administered   Fluad Quad(high Dose 65+) 02/08/2019, 03/05/2020, 02/09/2022   Influenza-Unspecified 03/21/2011, 03/20/2015   Moderna Sars-Covid-2 Vaccination 07/08/2019, 07/31/2019   PNEUMOCOCCAL CONJUGATE-20 09/29/2021   Pneumococcal Polysaccharide-23 04/13/2007, 03/05/2020   Tdap 09/14/2012    HPI NAZLY DIGILIO is 66 y.o. female who returns for follow-up with previsit  labs.  She was initially seen in consultation for  left adrenal adenoma requested by Lindell Spar, MD. The left adrenal adenoma was found to be stable and nonfunctioning.    She has a long and complicated medical history.  See notes from previous visits.    History is obtained directly from the patient and review of her medical records.  she has history of hypertension on multiple medications.  She is currently doing much better on losartan, clonidine, spironolactone, and low-dose amlodipine.    She also has history of Graves' disease which required radioactive iodine thyroid ablation in 2006.  She is currently on levothyroxine 125 mcg p.o. daily before breakfast.    She reports compliance and consistency.  . She continues to feel better, has normal new complaints today.   She reports more consistent energy level.  Her previsit thyroid function tests are consistent with appropriate replacement. She is known to have left adrenal mass at least since September 2005.  She has a series of abdominal and thoracic imaging studies since then and it was documented that this lesion was stable and displays features consistent with benign adenoma.  During her last abdominal CT in 2016 it measured 3.5 cm (3.2 cm in 2007) and the lesion demonstrated washout characteristics consistent with benign adrenal adenoma with absolute washout of 93%.  The right adrenal glands were normal. Over the years, she underwent various endocrine work-up for this adenoma and none of  them were significant for functioning lesion. Most recently in October 2021 she underwent 24-hour urine collection for metanephrines which were normal.  Her free plasma metanephrines before this visit were normal.   along with her hypertension, patient has had chronic hypokalemia which required intermittent supplement with potassium.  She is currently on lower dose potassium 20 mEq daily. She is not on regular diuresis with Lasix anymore, she uses Lasix  20 mg p.o. as needed.  She has well-controlled type 2 diabetes with point-of-care A1c today of 6%.  She is currently on metformin 500 mg p.o. twice daily.     She is recently diagnosed with breast cancer s/p chemo/surgery.      She has sleep apnea on CPAP machine. She does not consume licorice, no family history of premature coronary artery disease, CVA, adrenal, thyroid, parathyroid, pituitary dysfunctions.  Review of Systems  Minimally fluctuating body weight.  Objective:       05/05/2022   10:10 AM 03/23/2022   10:14 AM 03/08/2022    1:11 PM  Vitals with BMI  Height _0  _1  _2   Weight 246 lbs 13 oz 250 lbs 6 oz 245 lbs  BMI 43.73 27.51 70.0  Systolic 174 944 967  Diastolic 82 84 78  Pulse 68 60 59    BP 136/82   Pulse 68   Ht _3  (1.6 m)   Wt 246 lb 12.8 oz (111.9 kg)   BMI 43.72 kg/m   Wt Readings from Last 3 Encounters:  05/05/22 246 lb 12.8 oz (111.9 kg)  03/23/22 250 lb 6.4 oz (113.6 kg)  03/08/22 245 lb (111.1 kg)    Physical Exam She has bilateral peripheral edema.  CMP ( most recent) CMP     Component Value Date/Time   NA 143 05/02/2022 1054   K 3.9 05/02/2022 1054   CL 103 05/02/2022 1054   CO2 28 05/02/2022 1054   GLUCOSE 93 05/02/2022 1054   GLUCOSE 140 (H) 03/08/2022 1244   BUN 17 05/02/2022 1054   CREATININE 0.73 05/02/2022 1054   CREATININE 0.86 03/08/2022 1244   CREATININE 0.68 06/12/2020 0930   CALCIUM 9.2 05/02/2022 1054   PROT 6.8 05/02/2022 1054   ALBUMIN 3.7 (L) 05/02/2022 1054   AST 14 05/02/2022 1054   AST 12 (L) 03/08/2022 1244   ALT 10 05/02/2022 1054   ALT 9 03/08/2022 1244   ALKPHOS 109 05/02/2022 1054   BILITOT 0.4 05/02/2022 1054   BILITOT 0.4 03/08/2022 1244   GFRNONAA >60 03/08/2022 1244   GFRNONAA 94 11/07/2018 0927   GFRAA >60 02/17/2020 0930   GFRAA 109 11/07/2018 0927     Diabetic Labs (most recent): Lab Results  Component Value Date   HGBA1C 6.0 05/05/2022   HGBA1C 6.0 11/03/2021   HGBA1C 6.1  08/03/2021   MICROALBUR 5.3 09/09/2019   MICROALBUR 2.2 11/07/2018   MICROALBUR 0.3 04/03/2015     Lipid Panel ( most recent) Lipid Panel     Component Value Date/Time   CHOL 182 05/02/2022 1054   TRIG 63 05/02/2022 1054   HDL 59 05/02/2022 1054   CHOLHDL 3.1 05/02/2022 1054   CHOLHDL 3.2 06/12/2020 0930   VLDL 21 09/09/2016 1311   LDLCALC 111 (H) 05/02/2022 1054   LDLCALC 107 (H) 06/12/2020 0930   LDLDIRECT 110 (H) 01/21/2012 0950   LABVLDL 12 05/02/2022 1054     Recent Results (from the past 2160 hour(s))  CMP ([place] only)     Status: Abnormal  Collection Time: 03/08/22 12:44 PM  Result Value Ref Range   Sodium 140 135 - 145 mmol/L   Potassium 3.8 3.5 - 5.1 mmol/L   Chloride 104 98 - 111 mmol/L   CO2 33 (H) 22 - 32 mmol/L   Glucose, Bld 140 (H) 70 - 99 mg/dL    Comment: Glucose reference range applies only to samples taken after fasting for at least 8 hours.   BUN 24 (H) 8 - 23 mg/dL   Creatinine 0.86 0.44 - 1.00 mg/dL   Calcium 9.3 8.9 - 10.3 mg/dL   Total Protein 7.1 6.5 - 8.1 g/dL   Albumin 3.6 3.5 - 5.0 g/dL   AST 12 (L) 15 - 41 U/L   ALT 9 0 - 44 U/L   Alkaline Phosphatase 106 38 - 126 U/L   Total Bilirubin 0.4 0.3 - 1.2 mg/dL   GFR, Estimated >60 >60 mL/min    Comment: (NOTE) Calculated using the CKD-EPI Creatinine Equation (2021)    Anion gap 3 (L) 5 - 15    Comment: Performed at [facility], [place] [address], [place], [place] [address]  CBC with Differential ([place] Only)     Status: None   Collection Time: 03/08/22 12:44 PM  Result Value Ref Range   WBC Count 8.2 4.0 - 10.5 K/uL   RBC 4.51 3.87 - 5.11 MIL/uL   Hemoglobin 12.0 12.0 - 15.0 g/dL   HCT 37.6 36.0 - 46.0 %   MCV 83.4 80.0 - 100.0 fL   MCH 26.6 26.0 - 34.0 pg   MCHC 31.9 30.0 - 36.0 g/dL   RDW 14.6 11.5 - 15.5 %   Platelet Count 223 150 - 400 K/uL   nRBC 0.0 0.0 - 0.2 %   Neutrophils Relative % 62 %   Neutro Abs 5.1 1.7 - 7.7 K/uL   Lymphocytes  Relative 29 %   Lymphs Abs 2.4 0.7 - 4.0 K/uL   Monocytes Relative 6 %   Monocytes Absolute 0.5 0.1 - 1.0 K/uL   Eosinophils Relative 2 %   Eosinophils Absolute 0.1 0.0 - 0.5 K/uL   Basophils Relative 1 %   Basophils Absolute 0.0 0.0 - 0.1 K/uL   Immature Granulocytes 0 %   Abs Immature Granulocytes 0.02 0.00 - 0.07 K/uL    Comment: Performed at [facility], [place] [address], [place], [place] [address]  Comprehensive metabolic panel     Status: Abnormal   Collection Time: 05/02/22 10:54 AM  Result Value Ref Range   Glucose 93 70 - 99 mg/dL   BUN 17 8 - 27 mg/dL   Creatinine, Ser 0.73 0.57 - 1.00 mg/dL   eGFR 90 >59 mL/min/1.73   BUN/Creatinine Ratio 23 12 - 28   Sodium 143 134 - 144 mmol/L   Potassium 3.9 3.5 - 5.2 mmol/L   Chloride 103 96 - 106 mmol/L   CO2 28 20 - 29 mmol/L   Calcium 9.2 8.7 - 10.3 mg/dL   Total Protein 6.8 6.0 - 8.5 g/dL   Albumin 3.7 (L) 3.9 - 4.9 g/dL   Globulin, Total 3.1 1.5 - 4.5 g/dL   Albumin/Globulin Ratio 1.2 1.2 - 2.2   Bilirubin Total 0.4 0.0 - 1.2 mg/dL   Alkaline Phosphatase 109 44 - 121 IU/L   AST 14 0 - 40 IU/L   ALT 10 0 - 32 IU/L  TSH     Status: None   Collection Time: 05/02/22 10:54 AM  Result Value Ref Range   TSH 1.980 0.450 - 4.500 uIU/mL  T4, free     Status: None   Collection Time: 05/02/22 10:54 AM  Result Value Ref Range   Free T4 1.49 0.82 - 1.77 ng/dL  Lipid panel     Status: Abnormal   Collection Time: 05/02/22 10:54 AM  Result Value Ref Range   Cholesterol, Total 182 100 - 199 mg/dL   Triglycerides 63 0 - 149 mg/dL   HDL 59 >39 mg/dL   VLDL Cholesterol Cal 12 5 - 40 mg/dL   LDL Chol Calc (NIH) 111 (H) 0 - 99 mg/dL   Chol/HDL Ratio 3.1 0.0 - 4.4 ratio    Comment:                                   T. Chol/HDL Ratio                                             Men  Women                               1/2 Avg.Risk  3.4    3.3                                   Avg.Risk  5.0    4.4                                 2X Avg.Risk  9.6    7.1                                3X Avg.Risk 23.4   11.0   HgB A1c     Status: None   Collection Time: 05/05/22 10:24 AM  Result Value Ref Range   Hemoglobin A1C     HbA1c POC (<> result, manual entry)     HbA1c, POC (prediabetic range)     HbA1c, POC (controlled diabetic range) 6.0 0.0 - 7.0 %     Assessment & Plan:   1. Adrenal adenoma- left -Her plasma free metanephrines were normal.  Review of all of her previous work-up points to a stable nonfunctioning left adrenal adenoma.  This adenoma is not contributing to her history of hypertension.    This lesion was documented to be stable since 2005, she will not need surgical intervention at this time.     Her mild hypokalemia seems to be diuretic related, responding to low-dose potassium supplement.  She is currently on 20 mg of potassium daily.    She does not have paroxysmal symptoms to suggest pheochromocytoma.  Her recent work-up rules out  Cushing syndrome. Her recent work-up with aldosterone, plasma renin activity are not conclusive for primary hyperaldosteronism.  She will not need surgical treatment for the adenoma.  She will be considered for plasma metanephrines on subsequent visits. 2.  Essential hypertension  She is responding to a lesser number of antihypertensive medications.  She is advised to continue clonidine 0.2 mg p.o. twice daily, losartan 100 mg p.o.  daily, spironolactone 25 mg p.o. daily, and amlodipine 5 mg p.o. daily.  She has a chance to come off of amlodipine at 1 point.   Her blood pressure is controlled to target.  3.  RAI induced hypothyroidism  Regarding her RAI induced hypothyroidism: Her previsit thyroid function tests are still consistent with appropriate replacement.  She is advised to continue levothyroxine 125 mcg p.o. daily before breakfast.     - We discussed about the correct intake of her thyroid hormone, on empty stomach at fasting, with water, separated by  at least 30 minutes from breakfast and other medications,  and separated by more than 4 hours from calcium, iron, multivitamins, acid reflux medications (PPIs). -Patient is made aware of the fact that thyroid hormone replacement is needed for life, dose to be adjusted by periodic monitoring of thyroid function tests.    4.  Type 2 diabetes: Controlled with A1c 6%.   she is advised to continue metformin 500 mg p.o. twice daily. In light of her metabolic syndrome with type 2 diabetes, hypertension, hyperlipidemia obesity this patient will benefit from lifestyle medicine - she acknowledges that there is a room for improvement in her food and drink choices. - Suggestion is made for her to avoid simple carbohydrates  from her diet including Cakes, Sweet Desserts, Ice Cream, Soda (diet and regular), Sweet Tea, Candies, Chips, Cookies, Store Bought Juices, Alcohol , Artificial Sweeteners,  Coffee Creamer, and "Sugar-free" Products, Lemonade. This will help patient to have more stable blood glucose profile and potentially avoid unintended weight gain.  The following Lifestyle Medicine recommendations according to [place]  [facility]) were discussed and and offered to patient and she  agrees to start the journey:  A. Whole Foods, Plant-Based Nutrition comprising of fruits and vegetables, plant-based proteins, whole-grain carbohydrates was discussed in detail with the patient.   A list for source of those nutrients were also provided to the patient.  Patient will use only water or unsweetened tea for hydration. B.  The need to stay away from risky substances including alcohol, smoking; obtaining 7 to 9 hours of restorative sleep, at least 150 minutes of moderate intensity exercise weekly, the importance of healthy social connections,  and stress management techniques were discussed. C.  A full color page of  Calorie density of various food groups per pound showing examples of each food  groups was provided to the patient.     - she is advised to seek and establish a primary care provider in town, Dr. Posey Pronto was suggested for her primary care needs.   I spent 31 minutes in the care of the patient today including review of labs from Thyroid Function, CMP, and other relevant labs ; imaging/biopsy records (current and previous including abstractions from other facilities); face-to-face time discussing  her lab results and symptoms, medications doses, her options of short and long term treatment based on the latest standards of care / guidelines;   and documenting the encounter.  Frances Hughes  participated in the discussions, expressed understanding, and voiced agreement with the above plans.  All questions were answered to her satisfaction. she is encouraged to contact clinic should she have any questions or concerns prior to her return visit.    Follow up plan: Return in about 6 months (around 11/04/2022) for Fasting Labs  in AM B4 8, A1c -[place].   Glade Lloyd, MD [facility] [address] [place], [place] [address]  Phone: [phone]  Fax: [phone]     05/05/2022, 6:58 PM  This note was partially dictated with voice recognition software. Similar sounding words can be transcribed inadequately or may not  be corrected upon review.

## 2022-05-05 NOTE — Patient Instructions (Signed)
                                     Advice for Weight Management  -For most of us the best way to lose weight is by diet management. Generally speaking, diet management means consuming less calories intentionally which over time brings about progressive weight loss.  This can be achieved more effectively by avoiding ultra processed carbohydrates, processed meats, unhealthy fats.    It is critically important to know your numbers: how much calorie you are consuming and how much calorie you need. More importantly, our carbohydrates sources should be unprocessed naturally occurring  complex starch food items.  It is always important to balance nutrition also by  appropriate intake of proteins (mainly plant-based), healthy fats/oils, plenty of fruits and vegetables.   -The American College of Lifestyle Medicine (ACL M) recommends nutrition derived mostly from Whole Food, Plant Predominant Sources example an apple instead of applesauce or apple pie. Eat Plenty of vegetables, Mushrooms, fruits, Legumes, Whole Grains, Nuts, seeds in lieu of processed meats, processed snacks/pastries red meat, poultry, eggs.  Use only water or unsweetened tea for hydration.  The College also recommends the need to stay away from risky substances including alcohol, smoking; obtaining 7-9 hours of restorative sleep, at least 150 minutes of moderate intensity exercise weekly, importance of healthy social connections, and being mindful of stress and seek help when it is overwhelming.    -Sticking to a routine mealtime to eat 3 meals a day and avoiding unnecessary snacks is shown to have a big role in weight control. Under normal circumstances, the only time we burn stored energy is when we are hungry, so allow  some hunger to take place- hunger means no food between appropriate meal times, only water.  It is not advisable to starve.   -It is better to avoid simple carbohydrates including:  Cakes, Sweet Desserts, Ice Cream, Soda (diet and regular), Sweet Tea, Candies, Chips, Cookies, Store Bought Juices, Alcohol in Excess of  1-2 drinks a day, Lemonade,  Artificial Sweeteners, Doughnuts, Coffee Creamers, "Sugar-free" Products, etc, etc.  This is not a complete list.....    -Consulting with certified diabetes educators is proven to provide you with the most accurate and current information on diet.  Also, you may be  interested in discussing diet options/exchanges , we can schedule a visit with Frances Hughes, RDN, CDE for individualized nutrition education.  -Exercise: If you are able: 30 -60 minutes a day ,4 days a week, or 150 minutes of moderate intensity exercise weekly.    The longer the better if tolerated.  Combine stretch, strength, and aerobic activities.  If you were told in the past that you have high risk for cardiovascular diseases, or if you are currently symptomatic, you may seek evaluation by your heart doctor prior to initiating moderate to intense exercise programs.                                  Additional Care Considerations for Diabetes/Prediabetes   -Diabetes  is a chronic disease.  The most important care consideration is regular follow-up with your diabetes care provider with the goal being avoiding or delaying its complications and to take advantage of advances in medications and technology.  If appropriate actions are taken early enough, type 2 diabetes can even be   reversed.  Seek information from the right source.  - Whole Food, Plant Predominant Nutrition is highly recommended: Eat Plenty of vegetables, Mushrooms, fruits, Legumes, Whole Grains, Nuts, seeds in lieu of processed meats, processed snacks/pastries red meat, poultry, eggs as recommended by American College of  Lifestyle Medicine (ACLM).  -Type 2 diabetes is known to coexist with other important comorbidities such as high blood pressure and high cholesterol.  It is critical to control not only the  diabetes but also the high blood pressure and high cholesterol to minimize and delay the risk of complications including coronary artery disease, stroke, amputations, blindness, etc.  The good news is that this diet recommendation for type 2 diabetes is also very helpful for managing high cholesterol and high blood blood pressure.  - Studies showed that people with diabetes will benefit from a class of medications known as ACE inhibitors and statins.  Unless there are specific reasons not to be on these medications, the standard of care is to consider getting one from these groups of medications at an optimal doses.  These medications are generally considered safe and proven to help protect the heart and the kidneys.    - People with diabetes are encouraged to initiate and maintain regular follow-up with eye doctors, foot doctors, dentists , and if necessary heart and kidney doctors.     - It is highly recommended that people with diabetes quit smoking or stay away from smoking, and get yearly  flu vaccine and pneumonia vaccine at least every 5 years.  See above for additional recommendations on exercise, sleep, stress management , and healthy social connections.      

## 2022-05-07 ENCOUNTER — Other Ambulatory Visit: Payer: Self-pay | Admitting: "Endocrinology

## 2022-05-07 DIAGNOSIS — E782 Mixed hyperlipidemia: Secondary | ICD-10-CM

## 2022-05-24 ENCOUNTER — Other Ambulatory Visit: Payer: Self-pay | Admitting: "Endocrinology

## 2022-06-06 ENCOUNTER — Other Ambulatory Visit: Payer: Self-pay

## 2022-06-06 MED ORDER — ANASTROZOLE 1 MG PO TABS: 1.0000 mg | ORAL_TABLET | Freq: Every day | ORAL | 4 refills | Status: DC

## 2022-08-05 ENCOUNTER — Other Ambulatory Visit: Payer: Self-pay | Admitting: "Endocrinology

## 2022-08-05 DIAGNOSIS — E782 Mixed hyperlipidemia: Secondary | ICD-10-CM

## 2022-08-21 ENCOUNTER — Other Ambulatory Visit: Payer: Self-pay | Admitting: Internal Medicine

## 2022-08-21 DIAGNOSIS — I10 Essential (primary) hypertension: Secondary | ICD-10-CM

## 2022-08-24 ENCOUNTER — Ambulatory Visit (INDEPENDENT_AMBULATORY_CARE_PROVIDER_SITE_OTHER): Payer: Medicare Other | Admitting: Internal Medicine

## 2022-08-24 ENCOUNTER — Encounter: Payer: Self-pay | Admitting: Internal Medicine

## 2022-08-24 VITALS — BP 134/84 | HR 74 | Ht 63.0 in | Wt 249.8 lb

## 2022-08-24 DIAGNOSIS — E119 Type 2 diabetes mellitus without complications: Secondary | ICD-10-CM | POA: Diagnosis not present

## 2022-08-24 DIAGNOSIS — J0110 Acute frontal sinusitis, unspecified: Secondary | ICD-10-CM | POA: Diagnosis not present

## 2022-08-24 DIAGNOSIS — R600 Localized edema: Secondary | ICD-10-CM | POA: Diagnosis not present

## 2022-08-24 DIAGNOSIS — E782 Mixed hyperlipidemia: Secondary | ICD-10-CM | POA: Diagnosis not present

## 2022-08-24 DIAGNOSIS — D0512 Intraductal carcinoma in situ of left breast: Secondary | ICD-10-CM

## 2022-08-24 DIAGNOSIS — E890 Postprocedural hypothyroidism: Secondary | ICD-10-CM | POA: Diagnosis not present

## 2022-08-24 DIAGNOSIS — I10 Essential (primary) hypertension: Secondary | ICD-10-CM | POA: Diagnosis not present

## 2022-08-24 DIAGNOSIS — M7989 Other specified soft tissue disorders: Secondary | ICD-10-CM

## 2022-08-24 DIAGNOSIS — D3502 Benign neoplasm of left adrenal gland: Secondary | ICD-10-CM | POA: Diagnosis not present

## 2022-08-24 MED ORDER — AZITHROMYCIN 250 MG PO TABS: ORAL_TABLET | ORAL | 0 refills | Status: AC

## 2022-08-24 MED ORDER — FUROSEMIDE 20 MG PO TABS: 20.0000 mg | ORAL_TABLET | Freq: Every day | ORAL | 1 refills | Status: DC | PRN

## 2022-08-24 MED ORDER — LOSARTAN POTASSIUM 100 MG PO TABS: 100.0000 mg | ORAL_TABLET | Freq: Every day | ORAL | 1 refills | Status: DC

## 2022-08-24 MED ORDER — CLONIDINE HCL 0.2 MG PO TABS: 0.2000 mg | ORAL_TABLET | Freq: Two times a day (BID) | ORAL | 1 refills | Status: DC

## 2022-08-24 NOTE — Progress Notes (Signed)
Established Patient Office Visit  Subjective:  [name], female    DOB: 04-22-1956  Age: 67 y.o. MRN: [id number]  CC:  Chief Complaint  Patient presents with   Hypertension    Five month follow up for hypertension and diabetes. Patient states her ankles have been swollen for two days    HPI Frances Hughes is a 67 y.o. female with past medical history of type II DM, HTN, HLD, peripheral neuropathy, adrenal adenoma, breast cancer, hypothyroidism and morbid obesity who presents for f/u of her chronic medical conditions.  HTN: Her BP is well controlled now.  She has been taking amlodipine in addition to losartan 100 mg daily, and 0.2 mg twice daily currently.  She denies any headache, dizziness, chest pain, dyspnea or palpitations.  She has been trying to walk/exercise regularly, but has been difficult due to her leg pain from neuropathy.  She has been gaining weight lately, although her weight has been stable overall since the last visit.  Past Medical History:  Diagnosis Date   Adrenal mass, left    Breast cancer    Degenerative disc disease at L5-S1 level    Depression 07/15/2011   DM (diabetes mellitus)    Type II controlled   Enlarged heart    Family history of breast cancer    Family history of colon cancer    Family history of pancreatic cancer    Family history of stomach cancer    Grave's disease    History of kidney stones    HTN (hypertension)    Hyperlipidemia    Hypothyroidism    Lymphocytosis    Nephrolithiasis    OSA on CPAP     Past Surgical History:  Procedure Laterality Date   ABDOMINAL HYSTERECTOMY     BREAST BIOPSY Left 01/06/2020   BREAST LUMPECTOMY WITH RADIOACTIVE SEED LOCALIZATION Left 02/19/2020   Procedure: LEFT BREAST LUMPECTOMY WITH RADIOACTIVE SEED LOCALIZATION;  Surgeon: [name], MD;  Location: [place];  Service: General;  Laterality: Left;   COLONOSCOPY  2011   normal colonoscopy    COLONOSCOPY WITH PROPOFOL N/A 04/16/2018   Procedure: COLONOSCOPY WITH PROPOFOL;  Surgeon: Daneil Dolin, MD;  [facility];  Service: Endoscopy;  Laterality: N/A;  9:45am   COLONOSCOPY WITH PROPOFOL N/A 01/26/2022   Procedure: COLONOSCOPY WITH PROPOFOL;  Surgeon: Daneil Dolin, MD;  Location: [facility];  Service: Endoscopy;  Laterality: N/A;  11:15 am   HERNIA REPAIR  2011   LITHOTRIPSY     POLYPECTOMY  04/16/2018   Procedure: POLYPECTOMY;  Surgeon: Daneil Dolin, MD;  [facility];  Service: Endoscopy;;  (colon)   POLYPECTOMY  01/26/2022   Procedure: POLYPECTOMY;  Surgeon: Daneil Dolin, MD;  [facility];  Service: Endoscopy;;   S/P Hysterectomy  10/1996   with bilat SOO seconardt to fibroids   thyroid ablation  1986    Family History  Problem Relation Age of Onset   Colon cancer Mother        d. 74   Cancer Mother        metastisis sarcoma   Colon cancer Frances 42   Head & neck cancer Frances        roof of mouth   Colon polyps Frances    Diabetes Father    Hypertension Father    Cancer Father    Lung cancer Father        d.  81   Colon polyps Frances    Esophageal cancer Brother    Cancer Brother        cancer of eye   Aneurysm Maternal Aunt        brain   Cancer Maternal Uncle        NOS   COPD Maternal Uncle    Stomach cancer Maternal Grandfather 76   Heart attack Maternal Grandfather    Pancreatic cancer Paternal Grandmother        d. 22   Cancer Maternal Uncle        NOS - mother's pat 1/2 brother   Breast cancer Cousin        3 maternal cousins with breast cancer, some under 84, all different branches   Colon cancer Cousin        five mat first cousins, 6 were siblings   Cancer Cousin        2 mat 1/2 cousins with NOS cancer    Social History   Socioeconomic History   Marital status: Married    Spouse name: Tegan Mcfarland   Number of children: Not on file   Years of education: 12   Highest education level: 12th  grade  Occupational History   Not on file  Tobacco Use   Smoking status: Former    Packs/day: 0.75    Years: 10.00    Additional pack years: 0.00    Total pack years: 7.50    Types: Cigarettes    Start date: 01/30/1969    Quit date: 05/23/1988    Years since quitting: 34.2    Passive exposure: Past   Smokeless tobacco: Never  Vaping Use   Vaping Use: Never used  Substance and Sexual Activity   Alcohol use: No    Alcohol/week: 0.0 standard drinks of alcohol   Drug use: No   Sexual activity: Yes    Partners: Male  Other Topics Concern   Not on file  Social History Narrative   Not on file   Social Determinants of Health   Financial Resource Strain: Low Risk  (03/01/2022)   Overall Financial Resource Strain (CARDIA)    Difficulty of Paying Living Expenses: Not hard at all  Food Insecurity: No Food Insecurity (03/01/2022)   Hunger Vital Sign    Worried About Running Out of Food in the Last Year: Never true    Ran Out of Food in the Last Year: Never true  Transportation Needs: No Transportation Needs (03/01/2022)   PRAPARE - Hydrologist (Medical): No    Lack of Transportation (Non-Medical): No  Physical Activity: Inactive (03/01/2022)   Exercise Vital Sign    Days of Exercise per Week: 0 days    Minutes of Exercise per Session: 0 min  Stress: No Stress Concern Present (03/01/2022)   [place]    Feeling of Stress : Not at all  Social Connections: Moderately Integrated (03/01/2022)   Social Connection and Isolation Panel [NHANES]    Frequency of Communication with Friends and Family: More than three times a week    Frequency of Social Gatherings with Friends and Family: More than three times a week    Attends Religious Services: More than 4 times per year    Active Member of [employer] or Organizations: No    Attends Archivist Meetings: Never    Marital Status: Married   Human resources officer Violence: Not At Risk (03/01/2022)  Humiliation, Afraid, Rape, and Kick questionnaire    Fear of Current or Ex-Partner: No    Emotionally Abused: No    Physically Abused: No    Sexually Abused: No    Outpatient Medications Prior to Visit  Medication Sig Dispense Refill   amLODipine (NORVASC) 5 MG tablet TAKE 1 TABLET(5 MG) BY MOUTH DAILY 90 tablet 1   anastrozole (ARIMIDEX) 1 MG tablet Take 1 tablet (1 mg total) by mouth daily. Start 06/08/2020 90 tablet 4   aspirin 81 MG EC tablet Take 81 mg by mouth 3 (three) times a week. Swallow whole.     atorvastatin (LIPITOR) 40 MG tablet TAKE 1 TABLET(40 MG) BY MOUTH DAILY 90 tablet 0   Blood Glucose Monitoring Suppl (BLOOD GLUCOSE MONITOR SYSTEM) W/DEVICE KIT Please dispense based on patient and insurance preference. Use to monitor fasting blood sugar 1x daily. Dx: E11.9 1 each 0   cloNIDine (CATAPRES) 0.2 MG tablet Take 1 tablet (0.2 mg total) by mouth 2 (two) times daily. 180 tablet 1   furosemide (LASIX) 20 MG tablet Take 1 tablet (20 mg total) by mouth daily as needed. 30 tablet 1   Glucose Blood (BLOOD GLUCOSE TEST STRIPS) STRP Please dispense based on patient and insurance preference. Use to monitor fasting blood sugar 1x daily. Dx: E11.9 100 each 0   Lancets 28G MISC Please dispense based on patient and insurance preference. Use to monitor fasting blood sugar 1x daily. Dx: E11.9 100 each 0   levothyroxine (SYNTHROID) 125 MCG tablet TAKE 1 TABLET(125 MCG) BY MOUTH DAILY BEFORE BREAKFAST 90 tablet 1   losartan (COZAAR) 100 MG tablet TAKE 1 TABLET(100 MG) BY MOUTH DAILY 90 tablet 1   metFORMIN (GLUCOPHAGE) 500 MG tablet Take 1 tablet (500 mg total) by mouth daily. 90 tablet 1   Multiple Vitamin (MULTIVITAMIN WITH MINERALS) TABS tablet Take 1 tablet by mouth daily. (Patient not taking: Reported on 05/05/2022)     potassium chloride SA (KLOR-CON M) 20 MEQ tablet TAKE 1 TABLET BY MOUTH DAILY (Patient taking differently: Take 20  mEq by mouth daily as needed (when urinating frequently).) 90 tablet 0   spironolactone (ALDACTONE) 25 MG tablet Take 1 tablet (25 mg total) by mouth every morning. 90 tablet 1   terbinafine (LAMISIL) 250 MG tablet TAKE 1 TABLET(250 MG) BY MOUTH DAILY 30 tablet 2   No facility-administered medications prior to visit.    No Known Allergies  ROS Review of Systems  Constitutional:  Positive for fatigue. Negative for chills and fever.  HENT:  Negative for congestion, sinus pressure, sinus pain and sore throat.   Eyes:  Negative for pain and discharge.  Respiratory:  Negative for cough and shortness of breath.   Cardiovascular:  Negative for chest pain and palpitations.  Gastrointestinal:  Negative for abdominal pain, diarrhea, nausea and vomiting.  Endocrine: Negative for polydipsia and polyuria.  Genitourinary:  Negative for dysuria and hematuria.  Musculoskeletal:  Negative for neck pain and neck stiffness.  Skin:  Negative for rash.  Neurological:  Negative for dizziness and weakness.  Psychiatric/Behavioral:  Negative for agitation and behavioral problems.       Objective:    Physical Exam Vitals reviewed.  Constitutional:      General: She is not in acute distress.    Appearance: She is obese. She is not diaphoretic.  HENT:     Head: Normocephalic and atraumatic.     Nose: Nose normal.     Mouth/Throat:     Mouth:  Mucous membranes are moist.  Eyes:     General: No scleral icterus.    Extraocular Movements: Extraocular movements intact.  Cardiovascular:     Rate and Rhythm: Normal rate and regular rhythm.     Pulses: Normal pulses.     Heart sounds: Normal heart sounds. No murmur heard. Pulmonary:     Breath sounds: Normal breath sounds. No wheezing or rales.  Musculoskeletal:     Cervical back: Neck supple. No tenderness.     Right lower leg: No edema.     Left lower leg: No edema.  Skin:    General: Skin is warm.     Findings: No rash.  Neurological:      General: No focal deficit present.     Mental Status: She is alert and oriented to person, place, and time.     Sensory: No sensory deficit.     Motor: No weakness.  Psychiatric:        Mood and Affect: Mood normal.        Behavior: Behavior normal.     BP (!) 162/98 (BP Location: Left Arm, Patient Position: Sitting, Cuff Size: Large)   Pulse 74   Ht 5\' 3"  (1.6 m)   Wt 249 lb 12.8 oz (113.3 kg)   SpO2 90%   BMI 44.25 kg/m  Wt Readings from Last 3 Encounters:  08/24/22 249 lb 12.8 oz (113.3 kg)  05/05/22 246 lb 12.8 oz (111.9 kg)  03/23/22 250 lb 6.4 oz (113.6 kg)    Lab Results  Component Value Date   TSH 1.980 05/02/2022   Lab Results  Component Value Date   WBC 8.2 03/08/2022   HGB 12.0 03/08/2022   HCT 37.6 03/08/2022   MCV 83.4 03/08/2022   PLT 223 03/08/2022   Lab Results  Component Value Date   NA 143 05/02/2022   K 3.9 05/02/2022   CO2 28 05/02/2022   GLUCOSE 93 05/02/2022   BUN 17 05/02/2022   CREATININE 0.73 05/02/2022   BILITOT 0.4 05/02/2022   ALKPHOS 109 05/02/2022   AST 14 05/02/2022   ALT 10 05/02/2022   PROT 6.8 05/02/2022   ALBUMIN 3.7 (L) 05/02/2022   CALCIUM 9.2 05/02/2022   ANIONGAP 3 (L) 03/08/2022   EGFR 90 05/02/2022   Lab Results  Component Value Date   CHOL 182 05/02/2022   Lab Results  Component Value Date   HDL 59 05/02/2022   Lab Results  Component Value Date   LDLCALC 111 (H) 05/02/2022   Lab Results  Component Value Date   TRIG 63 05/02/2022   Lab Results  Component Value Date   CHOLHDL 3.1 05/02/2022   Lab Results  Component Value Date   HGBA1C 6.0 05/05/2022      Assessment & Plan:   Problem List Items Addressed This Visit   None  No orders of the defined types were placed in this encounter.   Follow-up: No follow-ups on file.    Lindell Spar, MD

## 2022-08-24 NOTE — Assessment & Plan Note (Signed)
On Lipitor 

## 2022-08-24 NOTE — Patient Instructions (Addendum)
Please do not start taking Spironolactone for now.  Please start taking Azithromycin as prescribed. Please use nasal saline spray as needed for nasal congestion.  Please continue to take medications as prescribed.  Please continue to follow low carb diet and perform moderate exercise/walking at least 150 mins/week.

## 2022-08-24 NOTE — Assessment & Plan Note (Signed)
Lab Results  Component Value Date   HGBA1C 6.0 05/05/2022   Well-controlled On metformin 500 mg twice daily Advised to follow diabetic diet On statin and ARB F/u CMP and lipid panel Diabetic eye exam: Advised to follow up with Ophthalmology for diabetic eye exam

## 2022-08-24 NOTE — Assessment & Plan Note (Signed)
S/p left lumpectomy and adjuvant radiation On Anastrazole Followed by Oncology 

## 2022-08-25 DIAGNOSIS — J0110 Acute frontal sinusitis, unspecified: Secondary | ICD-10-CM | POA: Insufficient documentation

## 2022-08-25 NOTE — Assessment & Plan Note (Signed)
Lab Results  Component Value Date   TSH 1.980 05/02/2022   On levothyroxine 125 mcg QD Followed by Endocrinology

## 2022-08-25 NOTE — Assessment & Plan Note (Signed)
Diet modification and moderate exercise/walking advised 

## 2022-08-25 NOTE — Assessment & Plan Note (Signed)
BP Readings from Last 1 Encounters:  08/24/22 134/84   Well-controlled with losartan 100 mg QD, amlodipine 5 mg QD and clonidine 0.2 mg twice daily DC spironolactone 25 mg daily as she has not been taking it and her BP is wnl without it Counseled for compliance with the medications Advised DASH diet and moderate exercise/walking, at least 150 mins/week

## 2022-08-25 NOTE — Assessment & Plan Note (Signed)
Chronic leg swelling Leg elevation and compression stocking Lasix as needed for persistent leg swelling

## 2022-08-25 NOTE — Assessment & Plan Note (Signed)
Started Azithromycin as she has persistent symptoms despite symptomatic treatment Nasal saline spray PRN for nasal congestion

## 2022-08-25 NOTE — Assessment & Plan Note (Signed)
Nonfunctioning adrenal adenoma ?Followed by Endocrinology ?

## 2022-10-05 ENCOUNTER — Other Ambulatory Visit: Payer: Self-pay

## 2022-10-05 MED ORDER — METFORMIN HCL 500 MG PO TABS: 500.0000 mg | ORAL_TABLET | Freq: Two times a day (BID) | ORAL | 0 refills | Status: DC

## 2022-11-04 ENCOUNTER — Ambulatory Visit: Payer: Medicare Other | Admitting: "Endocrinology

## 2022-11-04 ENCOUNTER — Encounter: Payer: Self-pay | Admitting: "Endocrinology

## 2022-11-04 VITALS — BP 116/82 | HR 68 | Ht 63.0 in | Wt 246.0 lb

## 2022-11-04 DIAGNOSIS — E890 Postprocedural hypothyroidism: Secondary | ICD-10-CM | POA: Diagnosis not present

## 2022-11-04 DIAGNOSIS — I10 Essential (primary) hypertension: Secondary | ICD-10-CM

## 2022-11-04 DIAGNOSIS — D3502 Benign neoplasm of left adrenal gland: Secondary | ICD-10-CM

## 2022-11-04 DIAGNOSIS — Z7984 Long term (current) use of oral hypoglycemic drugs: Secondary | ICD-10-CM | POA: Diagnosis not present

## 2022-11-04 DIAGNOSIS — E119 Type 2 diabetes mellitus without complications: Secondary | ICD-10-CM

## 2022-11-04 DIAGNOSIS — E782 Mixed hyperlipidemia: Secondary | ICD-10-CM | POA: Diagnosis not present

## 2022-11-04 LAB — POCT GLYCOSYLATED HEMOGLOBIN (HGB A1C): HbA1c, POC (controlled diabetic range): 6 % (ref 0.0–7.0)

## 2022-11-04 MED ORDER — LEVOTHYROXINE SODIUM 112 MCG PO TABS: 112.0000 ug | ORAL_TABLET | Freq: Every day | ORAL | 1 refills | Status: DC

## 2022-11-04 MED ORDER — METFORMIN HCL 500 MG PO TABS: 500.0000 mg | ORAL_TABLET | Freq: Every day | ORAL | 1 refills | Status: DC

## 2022-11-04 MED ORDER — ATORVASTATIN CALCIUM 40 MG PO TABS: ORAL_TABLET | ORAL | 1 refills | Status: DC

## 2022-11-04 NOTE — Patient Instructions (Signed)

## 2022-11-04 NOTE — Progress Notes (Signed)
11/04/2022, 1:35 PM  Endocrinology follow-up note   Subjective:    [name], female    DOB: [birth year], PCP Anabel Halon, MD   Past Medical History:  Diagnosis Date   Adrenal mass, left (HCC)    Breast cancer (HCC)    Degenerative disc disease at L5-S1 level    Depression 07/15/2011   DM (diabetes mellitus) (HCC)    Type II controlled   Enlarged heart    Family history of breast cancer    Family history of colon cancer    Family history of pancreatic cancer    Family history of stomach cancer    Grave's disease    History of kidney stones    HTN (hypertension)    Hyperlipidemia    Hypothyroidism    Lymphocytosis    Nephrolithiasis    OSA on CPAP    Past Surgical History:  Procedure Laterality Date   ABDOMINAL HYSTERECTOMY     BREAST BIOPSY Left 01/06/2020   BREAST LUMPECTOMY WITH RADIOACTIVE SEED LOCALIZATION Left 02/19/2020   Procedure: LEFT BREAST LUMPECTOMY WITH RADIOACTIVE SEED LOCALIZATION;  Surgeon: Abigail Miyamoto, MD;  Location: [facility];  Service: General;  Laterality: Left;   COLONOSCOPY  2011   normal colonoscopy   COLONOSCOPY WITH PROPOFOL N/A 04/16/2018   Procedure: COLONOSCOPY WITH PROPOFOL;  Surgeon: Corbin Ade, MD;  [facility];  Service: Endoscopy;  Laterality: N/A;  9:45am   COLONOSCOPY WITH PROPOFOL N/A 01/26/2022   Procedure: COLONOSCOPY WITH PROPOFOL;  Surgeon: Corbin Ade, MD;  Location: [facility];  Service: Endoscopy;  Laterality: N/A;  11:15 am   HERNIA REPAIR  2011   LITHOTRIPSY     POLYPECTOMY  04/16/2018   Procedure: POLYPECTOMY;  Surgeon: Corbin Ade, MD;  [facility];  Service: Endoscopy;;  (colon)   POLYPECTOMY  01/26/2022   Procedure: POLYPECTOMY;  Surgeon: Corbin Ade, MD;  [facility];  Service: Endoscopy;;   S/P Hysterectomy  10/1996   with bilat SOO seconardt to fibroids   thyroid  ablation  1986   Social History   Socioeconomic History   Marital status: Married    Spouse name: Loanne Lamarre   Number of children: Not on file   Years of education: 12   Highest education level: 12th grade  Occupational History   Not on file  Tobacco Use   Smoking status: Former    Packs/day: 0.75    Years: 10.00    Additional pack years: 0.00    Total pack years: 7.50    Types: Cigarettes    Start date: 01/30/1969    Quit date: 05/23/1988    Years since quitting: 34.4    Passive exposure: Past   Smokeless tobacco: Never  Vaping Use   Vaping Use: Never used  Substance and Sexual Activity   Alcohol use: No    Alcohol/week: 0.0 standard drinks of alcohol   Drug use: No   Sexual activity: Yes    Partners: Male  Other Topics Concern   Not on file  Social History Narrative   Not on file   Social Determinants of Health   Financial Resource Strain: Low  Risk  (03/01/2022)   Overall Financial Resource Strain (CARDIA)    Difficulty of Paying Living Expenses: Not hard at all  Food Insecurity: No Food Insecurity (03/01/2022)   Hunger Vital Sign    Worried About Running Out of Food in the Last Year: Never true    Ran Out of Food in the Last Year: Never true  Transportation Needs: No Transportation Needs (03/01/2022)   PRAPARE - Administrator, Civil Service (Medical): No    Lack of Transportation (Non-Medical): No  Physical Activity: Inactive (03/01/2022)   Exercise Vital Sign    Days of Exercise per Week: 0 days    Minutes of Exercise per Session: 0 min  Stress: No Stress Concern Present (03/01/2022)   [employer] of Occupational Health - Occupational Stress Questionnaire    Feeling of Stress : Not at all  Social Connections: Moderately Integrated (03/01/2022)   Social Connection and Isolation Panel [NHANES]    Frequency of Communication with Friends and Family: More than three times a week    Frequency of Social Gatherings with Friends and Family:  More than three times a week    Attends Religious Services: More than 4 times per year    Active Member of [employer] or Organizations: No    Attends Banker Meetings: Never    Marital Status: Married   Family History  Problem Relation Age of Onset   Colon cancer Mother        d. 70   Cancer Mother        metastisis sarcoma   Colon cancer Sister 82   Head & neck cancer Sister        roof of mouth   Colon polyps Sister    Diabetes Father    Hypertension Father    Cancer Father    Lung cancer Father        d. 51   Colon polyps Sister    Esophageal cancer Brother    Cancer Brother        cancer of eye   Aneurysm Maternal Aunt        brain   Cancer Maternal Uncle        NOS   COPD Maternal Uncle    Stomach cancer Maternal Grandfather 76   Heart attack Maternal Grandfather    Pancreatic cancer Paternal Grandmother        d. 71   Cancer Maternal Uncle        NOS - mother's pat 1/2 brother   Breast cancer Cousin        3 maternal cousins with breast cancer, some under 50, all different branches   Colon cancer Cousin        five mat first cousins, 4 were siblings   Cancer Cousin        2 mat 1/2 cousins with NOS cancer   Outpatient Encounter Medications as of 11/04/2022  Medication Sig   anastrozole (ARIMIDEX) 1 MG tablet Take 1 tablet (1 mg total) by mouth daily. Start 06/08/2020   aspirin 81 MG EC tablet Take 81 mg by mouth 3 (three) times a week. Swallow whole.   atorvastatin (LIPITOR) 40 MG tablet TAKE 1 TABLET(40 MG) BY MOUTH DAILY   Blood Glucose Monitoring Suppl (BLOOD GLUCOSE MONITOR SYSTEM) W/DEVICE KIT Please dispense based on patient and insurance preference. Use to monitor fasting blood sugar 1x daily. Dx: E11.9   cloNIDine (CATAPRES) 0.2 MG tablet Take 1 tablet (0.2 mg total) by  mouth 2 (two) times daily.   furosemide (LASIX) 20 MG tablet Take 1 tablet (20 mg total) by mouth daily as needed.   Glucose Blood (BLOOD GLUCOSE TEST STRIPS) STRP Please  dispense based on patient and insurance preference. Use to monitor fasting blood sugar 1x daily. Dx: E11.9   Lancets 28G MISC Please dispense based on patient and insurance preference. Use to monitor fasting blood sugar 1x daily. Dx: E11.9   levothyroxine (SYNTHROID) 112 MCG tablet Take 1 tablet (112 mcg total) by mouth daily before breakfast.   losartan (COZAAR) 100 MG tablet Take 1 tablet (100 mg total) by mouth daily.   metFORMIN (GLUCOPHAGE) 500 MG tablet Take 1 tablet (500 mg total) by mouth daily with breakfast.   Multiple Vitamin (MULTIVITAMIN WITH MINERALS) TABS tablet Take 1 tablet by mouth daily. (Patient not taking: Reported on 05/05/2022)   potassium chloride SA (KLOR-CON M) 20 MEQ tablet TAKE 1 TABLET BY MOUTH DAILY (Patient taking differently: Take 20 mEq by mouth daily as needed (when urinating frequently).)   [DISCONTINUED] amLODipine (NORVASC) 5 MG tablet TAKE 1 TABLET(5 MG) BY MOUTH DAILY   [DISCONTINUED] atorvastatin (LIPITOR) 40 MG tablet TAKE 1 TABLET(40 MG) BY MOUTH DAILY (Patient not taking: Reported on 11/04/2022)   [DISCONTINUED] levothyroxine (SYNTHROID) 125 MCG tablet TAKE 1 TABLET(125 MCG) BY MOUTH DAILY BEFORE BREAKFAST   [DISCONTINUED] metFORMIN (GLUCOPHAGE) 500 MG tablet Take 1 tablet (500 mg total) by mouth 2 (two) times daily with a meal. (Patient taking differently: Take 500 mg by mouth daily with breakfast.)   No facility-administered encounter medications on file as of 11/04/2022.   ALLERGIES: No Known Allergies  VACCINATION STATUS: Immunization History  Administered Date(s) Administered   Fluad Quad(high Dose 65+) 02/08/2019, 03/05/2020, 02/09/2022   Influenza-Unspecified 03/21/2011, 03/20/2015   Moderna Sars-Covid-2 Vaccination 07/08/2019, 07/31/2019   PNEUMOCOCCAL CONJUGATE-20 09/29/2021   Pneumococcal Polysaccharide-23 04/13/2007, 03/05/2020   Tdap 09/14/2012    HPI SHAULA PARADY is 67 y.o. female who returns for follow-up with previsit labs.  She  was initially seen in consultation for  left adrenal adenoma requested by Anabel Halon, MD. The left adrenal adenoma was found to be stable and nonfunctioning.    She has a long and complicated medical history.  See notes from previous visits.    History is obtained directly from the patient and review of her medical records.  she has history of hypertension on multiple medications.  She is currently doing much better on losartan, clonidine, spironolactone, and low-dose amlodipine.    She also has history of Graves' disease which required radioactive iodine thyroid ablation in 2006.  She is currently on levothyroxine 125 mcg p.o. daily before breakfast.    She reports compliance and consistency.  . She continues to feel better, has no new complaints today.   She reports more consistent energy level.  Her previsit thyroid function tests are consistent with slight over-replacement.    She is known to have left adrenal mass at least since September 2005.  She has a series of abdominal and thoracic imaging studies since then and it was documented that this lesion was stable and displays features consistent with benign adenoma.  During her last abdominal CT in 2016 it measured 3.5 cm (3.2 cm in 2007) and the lesion demonstrated washout characteristics consistent with benign adrenal adenoma with absolute washout of 93%.  The right adrenal glands were normal. Over the years, she underwent various endocrine work-up for this adenoma and none of them were significant  for functioning lesion. Most recently in October 2021 she underwent 24-hour urine collection for metanephrines which were normal.  Her free plasma metanephrines were normal.   Along with her hypertension, patient has had chronic hypokalemia which required intermittent supplement with potassium.  She is currently on lower dose potassium 20 mEq daily. She is not on regular diuresis with Lasix anymore, she uses Lasix 20 mg p.o. as needed.  She  has well-controlled type 2 diabetes with point-of-care A1c today is 6%.  He is currently on metformin 500 mg p.o. once a day.    She is recently diagnosed with breast cancer s/p chemo/surgery.      She has sleep apnea on CPAP machine. She does not consume licorice, no family history of premature coronary artery disease, CVA, adrenal, thyroid, parathyroid, pituitary dysfunctions.  Review of Systems  Minimally fluctuating body weight.  Objective:       11/04/2022   10:28 AM 08/24/2022   10:48 AM 08/24/2022   10:20 AM  Vitals with BMI  Height 5\' 3"     Weight 246 lbs    BMI 43.59    Systolic 116 134 147  Diastolic 82 84 98  Pulse 68      BP 116/82   Pulse 68   Ht 5\' 3"  (1.6 m)   Wt 246 lb (111.6 kg)   BMI 43.58 kg/m   Wt Readings from Last 3 Encounters:  11/04/22 246 lb (111.6 kg)  08/24/22 249 lb 12.8 oz (113.3 kg)  05/05/22 246 lb 12.8 oz (111.9 kg)    Physical Exam She has bilateral peripheral edema.  CMP ( most recent) CMP     Component Value Date/Time   NA 143 10/27/2022 1043   K 3.8 10/27/2022 1043   CL 101 10/27/2022 1043   CO2 28 10/27/2022 1043   GLUCOSE 103 (H) 10/27/2022 1043   GLUCOSE 140 (H) 03/08/2022 1244   BUN 17 10/27/2022 1043   CREATININE 0.72 10/27/2022 1043   CREATININE 0.86 03/08/2022 1244   CREATININE 0.68 06/12/2020 0930   CALCIUM 9.3 10/27/2022 1043   PROT 6.9 10/27/2022 1043   ALBUMIN 3.9 10/27/2022 1043   AST 14 10/27/2022 1043   AST 12 (L) 03/08/2022 1244   ALT 10 10/27/2022 1043   ALT 9 03/08/2022 1244   ALKPHOS 119 10/27/2022 1043   BILITOT 0.3 10/27/2022 1043   BILITOT 0.4 03/08/2022 1244   GFRNONAA >60 03/08/2022 1244   GFRNONAA 94 11/07/2018 0927   GFRAA >60 02/17/2020 0930   GFRAA 109 11/07/2018 0927     Diabetic Labs (most recent): Lab Results  Component Value Date   HGBA1C 6.0 11/04/2022   HGBA1C 6.0 05/05/2022   HGBA1C 6.0 11/03/2021   MICROALBUR 5.3 09/09/2019   MICROALBUR 2.2 11/07/2018   MICROALBUR 0.3  04/03/2015     Lipid Panel ( most recent) Lipid Panel     Component Value Date/Time   CHOL 165 10/27/2022 1043   TRIG 68 10/27/2022 1043   HDL 56 10/27/2022 1043   CHOLHDL 2.9 10/27/2022 1043   CHOLHDL 3.2 06/12/2020 0930   VLDL 21 09/09/2016 1311   LDLCALC 96 10/27/2022 1043   LDLCALC 107 (H) 06/12/2020 0930   LDLDIRECT 110 (H) 01/21/2012 0950   LABVLDL 13 10/27/2022 1043     Recent Results (from the past 2160 hour(s))  Metanephrines, plasma     Status: None (Preliminary result)   Collection Time: 10/27/22 10:43 AM  Result Value Ref Range   Normetanephrine, Free WILL FOLLOW  Metanephrine, Free WILL FOLLOW   Comprehensive metabolic panel     Status: Abnormal   Collection Time: 10/27/22 10:43 AM  Result Value Ref Range   Glucose 103 (H) 70 - 99 mg/dL   BUN 17 8 - 27 mg/dL   Creatinine, Ser 1.61 0.57 - 1.00 mg/dL   eGFR 91 >09 UE/AVW/0.98   BUN/Creatinine Ratio 24 12 - 28   Sodium 143 134 - 144 mmol/L   Potassium 3.8 3.5 - 5.2 mmol/L   Chloride 101 96 - 106 mmol/L   CO2 28 20 - 29 mmol/L   Calcium 9.3 8.7 - 10.3 mg/dL   Total Protein 6.9 6.0 - 8.5 g/dL   Albumin 3.9 3.9 - 4.9 g/dL   Globulin, Total 3.0 1.5 - 4.5 g/dL   Albumin/Globulin Ratio 1.3 1.2 - 2.2   Bilirubin Total 0.3 0.0 - 1.2 mg/dL   Alkaline Phosphatase 119 44 - 121 IU/L   AST 14 0 - 40 IU/L   ALT 10 0 - 32 IU/L  TSH     Status: None   Collection Time: 10/27/22 10:43 AM  Result Value Ref Range   TSH 0.529 0.450 - 4.500 uIU/mL  T4, free     Status: Abnormal   Collection Time: 10/27/22 10:43 AM  Result Value Ref Range   Free T4 1.79 (H) 0.82 - 1.77 ng/dL  Lipid panel     Status: None   Collection Time: 10/27/22 10:43 AM  Result Value Ref Range   Cholesterol, Total 165 100 - 199 mg/dL   Triglycerides 68 0 - 149 mg/dL   HDL 56 >11 mg/dL   VLDL Cholesterol Cal 13 5 - 40 mg/dL   LDL Chol Calc (NIH) 96 0 - 99 mg/dL   Chol/HDL Ratio 2.9 0.0 - 4.4 ratio    Comment:                                    T. Chol/HDL Ratio                                             Men  Women                               1/2 Avg.Risk  3.4    3.3                                   Avg.Risk  5.0    4.4                                2X Avg.Risk  9.6    7.1                                3X Avg.Risk 23.4   11.0   HgB A1c     Status: None   Collection Time: 11/04/22 10:42 AM  Result Value Ref Range   Hemoglobin A1C     HbA1c POC (<> result, manual entry)     HbA1c, POC (prediabetic range)  HbA1c, POC (controlled diabetic range) 6.0 0.0 - 7.0 %     Assessment & Plan:   1. Adrenal adenoma- left -Her plasma free metanephrines were normal.  Review of all of her previous work-up points to a stable nonfunctioning left adrenal adenoma.  This adenoma is not contributing to her history of hypertension.    This lesion was documented to be stable since 2005, she will not need surgical intervention at this time.     Her mild hypokalemia seems to be diuretic related, responding to low-dose potassium supplement.  She is currently on 20 mg of potassium daily.    She does not have paroxysmal symptoms to suggest pheochromocytoma.  Her recent work-up rules out  Cushing syndrome. Her recent work-up with aldosterone, plasma renin activity are not conclusive for primary hyperaldosteronism.  She will not need to surgical intervention for the adenoma at this time.     2.  Essential hypertension  She is responding to a lesser number of antihypertensive medications.  She is advised to continue clonidine 0.2 mg p.o. twice daily, losartan 100 mg p.o. daily, spironolactone 25 mg p.o. daily.  She is advised to discontinue amlodipine  Her blood pressure is controlled to target.  3.  RAI induced hypothyroidism  Regarding her RAI induced hypothyroidism: Her previsit thyroid function tests are consistent with slight over-replacement.  I discussed and lowered her levothyroxine to 112 mcg p.o. daily before breakfast.     - We  discussed about the correct intake of her thyroid hormone, on empty stomach at fasting, with water, separated by at least 30 minutes from breakfast and other medications,  and separated by more than 4 hours from calcium, iron, multivitamins, acid reflux medications (PPIs). -Patient is made aware of the fact that thyroid hormone replacement is needed for life, dose to be adjusted by periodic monitoring of thyroid function tests.    4.  Type 2 diabetes: Controlled with A1c 6%.   she is advised to continue metformin 500 mg p.o. once daily at breakfast.   - she acknowledges that there is a room for improvement in her food and drink choices. - Suggestion is made for her to avoid simple carbohydrates  from her diet including Cakes, Sweet Desserts, Ice Cream, Soda (diet and regular), Sweet Tea, Candies, Chips, Cookies, Store Bought Juices, Alcohol , Artificial Sweeteners,  Coffee Creamer, and "Sugar-free" Products, Lemonade. This will help patient to have more stable blood glucose profile and potentially avoid unintended weight gain.  The following Lifestyle Medicine recommendations according to American College of Lifestyle Medicine  [facility]) were discussed and and offered to patient and she  agrees to start the journey:  A. Whole Foods, Plant-Based Nutrition comprising of fruits and vegetables, plant-based proteins, whole-grain carbohydrates was discussed in detail with the patient.   A list for source of those nutrients were also provided to the patient.  Patient will use only water or unsweetened tea for hydration. B.  The need to stay away from risky substances including alcohol, smoking; obtaining 7 to 9 hours of restorative sleep, at least 150 minutes of moderate intensity exercise weekly, the importance of healthy social connections,  and stress management techniques were discussed. C.  A full color page of  Calorie density of various food groups per pound showing examples of each food groups was  provided to the patient.  For hyperlipidemia, she is advised to continue Lipitor 40 mg p.o. nightly.   - she is advised to seek and establish a  primary care provider in town, Dr. Allena Katz was suggested for her primary care needs.    I spent  25  minutes in the care of the patient today including review of labs from Thyroid Function, CMP, and other relevant labs ; imaging/biopsy records (current and previous including abstractions from other facilities); face-to-face time discussing  her lab results and symptoms, medications doses, her options of short and long term treatment based on the latest standards of care / guidelines;   and documenting the encounter.  Frances Hughes  participated in the discussions, expressed understanding, and voiced agreement with the above plans.  All questions were answered to her satisfaction. she is encouraged to contact clinic should she have any questions or concerns prior to her return visit.    Follow up plan: Return in about 6 months (around 05/06/2023) for Fasting Labs  in AM B4 8, A1c -[place].   Marquis Lunch, MD [facility] [address] [place], [place] [address] Phone: [phone]  Fax: [phone]     11/04/2022, 1:35 PM  This note was partially dictated with voice recognition software. Similar sounding words can be transcribed inadequately or may not  be corrected upon review.

## 2022-11-05 ENCOUNTER — Other Ambulatory Visit: Payer: Self-pay | Admitting: "Endocrinology

## 2022-11-05 DIAGNOSIS — E782 Mixed hyperlipidemia: Secondary | ICD-10-CM

## 2022-11-05 LAB — LIPID PANEL
Chol/HDL Ratio: 2.9 ratio (ref 0.0–4.4)
Cholesterol, Total: 165 mg/dL (ref 100–199)
HDL: 56 mg/dL (ref >39)
LDL Chol Calc (NIH): 96 mg/dL (ref 0–99)
Triglycerides: 68 mg/dL (ref 0–149)
VLDL Cholesterol Cal: 13 mg/dL (ref 5–40)

## 2022-11-05 LAB — COMPREHENSIVE METABOLIC PANEL
ALT: 10 IU/L (ref 0–32)
AST: 14 IU/L (ref 0–40)
Albumin/Globulin Ratio: 1.3 (ref 1.2–2.2)
Albumin: 3.9 g/dL (ref 3.9–4.9)
Alkaline Phosphatase: 119 IU/L (ref 44–121)
BUN/Creatinine Ratio: 24 (ref 12–28)
BUN: 17 mg/dL (ref 8–27)
Bilirubin Total: 0.3 mg/dL (ref 0.0–1.2)
CO2: 28 mmol/L (ref 20–29)
Calcium: 9.3 mg/dL (ref 8.7–10.3)
Chloride: 101 mmol/L (ref 96–106)
Creatinine, Ser: 0.72 mg/dL (ref 0.57–1.00)
Globulin, Total: 3 g/dL (ref 1.5–4.5)
Glucose: 103 mg/dL — ABNORMAL HIGH (ref 70–99)
Potassium: 3.8 mmol/L (ref 3.5–5.2)
Sodium: 143 mmol/L (ref 134–144)
Total Protein: 6.9 g/dL (ref 6.0–8.5)
eGFR: 91 mL/min/{1.73_m2} (ref >59)

## 2022-11-05 LAB — TSH: TSH: 0.529 u[IU]/mL (ref 0.450–4.500)

## 2022-11-05 LAB — T4, FREE: Free T4: 1.79 ng/dL — ABNORMAL HIGH (ref 0.82–1.77)

## 2022-11-05 LAB — METANEPHRINES, PLASMA
Metanephrine, Free: <25 pg/mL (ref 0.0–88.0)
Normetanephrine, Free: 47.3 pg/mL (ref 0.0–285.2)

## 2022-11-12 ENCOUNTER — Other Ambulatory Visit: Payer: Self-pay | Admitting: Internal Medicine

## 2022-11-12 DIAGNOSIS — R600 Localized edema: Secondary | ICD-10-CM

## 2022-11-23 DIAGNOSIS — T63441A Toxic effect of venom of bees, accidental (unintentional), initial encounter: Secondary | ICD-10-CM | POA: Diagnosis not present

## 2022-11-23 DIAGNOSIS — L03213 Periorbital cellulitis: Secondary | ICD-10-CM | POA: Diagnosis not present

## 2023-01-04 ENCOUNTER — Ambulatory Visit (INDEPENDENT_AMBULATORY_CARE_PROVIDER_SITE_OTHER): Payer: Medicare Other

## 2023-01-04 VITALS — Ht 63.0 in | Wt 237.0 lb

## 2023-01-04 DIAGNOSIS — Z780 Asymptomatic menopausal state: Secondary | ICD-10-CM

## 2023-01-04 DIAGNOSIS — Z0000 Encounter for general adult medical examination without abnormal findings: Secondary | ICD-10-CM | POA: Diagnosis not present

## 2023-01-04 NOTE — Patient Instructions (Addendum)
Frances Hughes , Thank you for taking time to come for your Medicare Wellness Visit. I appreciate your ongoing commitment to your health goals. Please review the following plan we discussed and let me know if I can assist you in the future.   These are the goals we discussed:  Goals    . Patient Stated     "Get healthier and lose a little weight."       This is a list of the screening recommended for you and due dates:  Health Maintenance  Topic Date Due  . Zoster (Shingles) Vaccine (1 of 2) Never done  . COVID-19 Vaccine (3 - Moderna risk series) 08/28/2019  . DTaP/Tdap/Td vaccine (2 - Td or Tdap) 09/15/2022  . Yearly kidney health urinalysis for diabetes  09/30/2022  . Complete foot exam   09/30/2022  . Eye exam for diabetics  12/09/2022  . Flu Shot  12/22/2022  . Mammogram  03/03/2023  . DEXA scan (bone density measurement)  03/11/2023  . Hemoglobin A1C  05/06/2023  . Yearly kidney function blood test for diabetes  10/27/2023  . Medicare Annual Wellness Visit  01/04/2024  . Colon Cancer Screening  01/27/2027  . Pneumonia Vaccine  Completed  . Hepatitis C Screening  Completed  . HPV Vaccine  Aged Out    Advanced directives: Advance directive discussed with you today. Even though you declined this today, please call our office should you change your mind, and we can give you the proper paperwork for you to fill out. Advance care planning is a way to make decisions about medical care that fits your values in case you are ever unable to make these decisions for yourself.  Information on Advanced Care Planning can be found at [facility] of [facility] Advance Health Care Directives Advance Health Care Directives ([email or web address]    Conditions/risks identified:  You are due for the vaccines checked below. You may have these done at your preferred pharmacy. Please have them fax the office proof of the vaccines so that we can update your chart.   [x]  Flu (due annually) [x]   Shingrix (Shingles vaccine) []  Pneumonia Vaccines [x]  TDAP (Tetanus) Vaccine every 10 years [x]  Covid-19  Bone Density Test A bone density test uses a type of X-ray to measure the amount of calcium and other minerals in a person's bones. It can measure bone density in the hip and the spine. The test is similar to having a regular X-ray. This test may also be called: Bone densitometry. Bone mineral density test. Dual-energy X-ray absorptiometry (DEXA). You may have this test to: Diagnose a condition that causes weak or thin bones (osteoporosis). Screen you for osteoporosis. Predict your risk for a broken bone (fracture). Determine how well your osteoporosis treatment is working. Tell a health care provider about: Any allergies you have. All medicines you are taking, including vitamins, herbs, eye drops, creams, and over-the-counter medicines. Any problems you or family members have had with anesthetic medicines. Any blood disorders you have. Any surgeries you have had. Any medical conditions you have. Whether you are pregnant or may be pregnant. Any medical tests you have had within the past 14 days that used contrast material. What are the risks? Generally, this is a safe test. However, it does expose you to a small amount of radiation, which can slightly increase your cancer risk. What happens before the test? Do not take any calcium supplements within the 24 hours before your test. You will need to remove  all metal jewelry, eyeglasses, removable dental appliances, and any other metal objects on your body. What happens during the test?  You will lie down on an exam table. There will be an X-ray generator below you and an imaging device above you. Other devices, such as boxes or braces, may be used to position your body properly for the scan. The machine will slowly scan your body. You will need to keep very still while the machine does the scan. The images will show up on a screen  in the room. Images will be examined by a specialist after your test is finished. The procedure may vary among health care providers and hospitals. What can I expect after the test? It is up to you to get the results of your test. Ask your health care provider, or the department that is doing the test, when your results will be ready. Summary A bone density test is an imaging test that uses a type of X-ray to measure the amount of calcium and other minerals in your bones. The test may be used to diagnose or screen you for a condition that causes weak or thin bones (osteoporosis), predict your risk for a broken bone (fracture), or determine how well your osteoporosis treatment is working. Do not take any calcium supplements within 24 hours before your test. Ask your health care provider, or the department that is doing the test, when your results will be ready. This information is not intended to replace advice given to you by your health care provider. Make sure you discuss any questions you have with your health care provider. Document Revised: 01/20/2021 Document Reviewed: 10/24/2019 Elsevier Patient Education  2024 Elsevier Inc.   Next appointment: VIRTUAL/TELEPHONE APPOINTMENT Follow up in one year for your annual wellness visit  February 28, 2024 at 9:30 am. This will be a telephone call. Please be prepared with a current list of your medications, a list of providers that you currently see, and any questions you may have. The nurse may call you up to 15-20 minutes before or after your scheduled appointment time, depending on how their schedule is running that day.    Preventive Care 38 Years and Older, Female Preventive care refers to lifestyle choices and visits with your health care provider that can promote health and wellness. What does preventive care include? A yearly physical exam. This is also called an annual well check. Dental exams once or twice a year. Routine eye exams. Ask  your health care provider how often you should have your eyes checked. Personal lifestyle choices, including: Daily care of your teeth and gums. Regular physical activity. Eating a healthy diet. Avoiding tobacco and drug use. Limiting alcohol use. Practicing safe sex. Taking low-dose aspirin every day. Taking vitamin and mineral supplements as recommended by your health care provider. What happens during an annual well check? The services and screenings done by your health care provider during your annual well check will depend on your age, overall health, lifestyle risk factors, and family history of disease. Counseling  Your health care provider may ask you questions about your: Alcohol use. Tobacco use. Drug use. Emotional well-being. Home and relationship well-being. Sexual activity. Eating habits. History of falls. Memory and ability to understand (cognition). Work and work Astronomer. Reproductive health. Screening  You may have the following tests or measurements: Height, weight, and BMI. Blood pressure. Lipid and cholesterol levels. These may be checked every 5 years, or more frequently if you are over 50 years  old. Skin check. Lung cancer screening. You may have this screening every year starting at age 80 if you have a 30-pack-year history of smoking and currently smoke or have quit within the past 15 years. Fecal occult blood test (FOBT) of the stool. You may have this test every year starting at age 20. Flexible sigmoidoscopy or colonoscopy. You may have a sigmoidoscopy every 5 years or a colonoscopy every 10 years starting at age 73. Hepatitis C blood test. Hepatitis B blood test. Sexually transmitted disease (STD) testing. Diabetes screening. This is done by checking your blood sugar (glucose) after you have not eaten for a while (fasting). You may have this done every 1-3 years. Bone density scan. This is done to screen for osteoporosis. You may have this done  starting at age 68. Mammogram. This may be done every 1-2 years. Talk to your health care provider about how often you should have regular mammograms. Talk with your health care provider about your test results, treatment options, and if necessary, the need for more tests. Vaccines  Your health care provider may recommend certain vaccines, such as: Influenza vaccine. This is recommended every year. Tetanus, diphtheria, and acellular pertussis (Tdap, Td) vaccine. You may need a Td booster every 10 years. Zoster vaccine. You may need this after age 43. Pneumococcal 13-valent conjugate (PCV13) vaccine. One dose is recommended after age 97. Pneumococcal polysaccharide (PPSV23) vaccine. One dose is recommended after age 58. Talk to your health care provider about which screenings and vaccines you need and how often you need them. This information is not intended to replace advice given to you by your health care provider. Make sure you discuss any questions you have with your health care provider. Document Released: 06/05/2015 Document Revised: 01/27/2016 Document Reviewed: 03/10/2015 Elsevier Interactive Patient Education  2017 [employer].  Fall Prevention in the Home Falls can cause injuries. They can happen to people of all ages. There are many things you can do to make your home safe and to help prevent falls. What can I do on the outside of my home? Regularly fix the edges of walkways and driveways and fix any cracks. Remove anything that might make you trip as you walk through a door, such as a raised step or threshold. Trim any bushes or trees on the path to your home. Use bright outdoor lighting. Clear any walking paths of anything that might make someone trip, such as rocks or tools. Regularly check to see if handrails are loose or broken. Make sure that both sides of any steps have handrails. Any raised decks and porches should have guardrails on the edges. Have any leaves, snow, or  ice cleared regularly. Use sand or salt on walking paths during winter. Clean up any spills in your garage right away. This includes oil or grease spills. What can I do in the bathroom? Use night lights. Install grab bars by the toilet and in the tub and shower. Do not use towel bars as grab bars. Use non-skid mats or decals in the tub or shower. If you need to sit down in the shower, use a plastic, non-slip stool. Keep the floor dry. Clean up any water that spills on the floor as soon as it happens. Remove soap buildup in the tub or shower regularly. Attach bath mats securely with double-sided non-slip rug tape. Do not have throw rugs and other things on the floor that can make you trip. What can I do in the bedroom? Use night  lights. Make sure that you have a light by your bed that is easy to reach. Do not use any sheets or blankets that are too big for your bed. They should not hang down onto the floor. Have a firm chair that has side arms. You can use this for support while you get dressed. Do not have throw rugs and other things on the floor that can make you trip. What can I do in the kitchen? Clean up any spills right away. Avoid walking on wet floors. Keep items that you use a lot in easy-to-reach places. If you need to reach something above you, use a strong step stool that has a grab bar. Keep electrical cords out of the way. Do not use floor polish or wax that makes floors slippery. If you must use wax, use non-skid floor wax. Do not have throw rugs and other things on the floor that can make you trip. What can I do with my stairs? Do not leave any items on the stairs. Make sure that there are handrails on both sides of the stairs and use them. Fix handrails that are broken or loose. Make sure that handrails are as long as the stairways. Check any carpeting to make sure that it is firmly attached to the stairs. Fix any carpet that is loose or worn. Avoid having throw rugs at  the top or bottom of the stairs. If you do have throw rugs, attach them to the floor with carpet tape. Make sure that you have a light switch at the top of the stairs and the bottom of the stairs. If you do not have them, ask someone to add them for you. What else can I do to help prevent falls? Wear shoes that: Do not have high heels. Have rubber bottoms. Are comfortable and fit you well. Are closed at the toe. Do not wear sandals. If you use a stepladder: Make sure that it is fully opened. Do not climb a closed stepladder. Make sure that both sides of the stepladder are locked into place. Ask someone to hold it for you, if possible. Clearly mark and make sure that you can see: Any grab bars or handrails. First and last steps. Where the edge of each step is. Use tools that help you move around (mobility aids) if they are needed. These include: Canes. Walkers. Scooters. Crutches. Turn on the lights when you go into a dark area. Replace any light bulbs as soon as they burn out. Set up your furniture so you have a clear path. Avoid moving your furniture around. If any of your floors are uneven, fix them. If there are any pets around you, be aware of where they are. Review your medicines with your doctor. Some medicines can make you feel dizzy. This can increase your chance of falling. Ask your doctor what other things that you can do to help prevent falls. This information is not intended to replace advice given to you by your health care provider. Make sure you discuss any questions you have with your health care provider. Document Released: 03/05/2009 Document Revised: 10/15/2015 Document Reviewed: 06/13/2014 Elsevier Interactive Patient Education  2017 Elsevier Inc.  Bone Density Test A bone density test uses a type of X-ray to measure the amount of calcium and other minerals in a person's bones. It can measure bone density in the hip and the spine. The test is similar to having a  regular X-ray. This test may also be called: Bone  densitometry. Bone mineral density test. Dual-energy X-ray absorptiometry (DEXA). You may have this test to: Diagnose a condition that causes weak or thin bones (osteoporosis). Screen you for osteoporosis. Predict your risk for a broken bone (fracture). Determine how well your osteoporosis treatment is working. Tell a health care provider about: Any allergies you have. All medicines you are taking, including vitamins, herbs, eye drops, creams, and over-the-counter medicines. Any problems you or family members have had with anesthetic medicines. Any blood disorders you have. Any surgeries you have had. Any medical conditions you have. Whether you are pregnant or may be pregnant. Any medical tests you have had within the past 14 days that used contrast material. What are the risks? Generally, this is a safe test. However, it does expose you to a small amount of radiation, which can slightly increase your cancer risk. What happens before the test? Do not take any calcium supplements within the 24 hours before your test. You will need to remove all metal jewelry, eyeglasses, removable dental appliances, and any other metal objects on your body. What happens during the test?  You will lie down on an exam table. There will be an X-ray generator below you and an imaging device above you. Other devices, such as boxes or braces, may be used to position your body properly for the scan. The machine will slowly scan your body. You will need to keep very still while the machine does the scan. The images will show up on a screen in the room. Images will be examined by a specialist after your test is finished. The procedure may vary among health care providers and hospitals. What can I expect after the test? It is up to you to get the results of your test. Ask your health care provider, or the department that is doing the test, when your results will  be ready. Summary A bone density test is an imaging test that uses a type of X-ray to measure the amount of calcium and other minerals in your bones. The test may be used to diagnose or screen you for a condition that causes weak or thin bones (osteoporosis), predict your risk for a broken bone (fracture), or determine how well your osteoporosis treatment is working. Do not take any calcium supplements within 24 hours before your test. Ask your health care provider, or the department that is doing the test, when your results will be ready. This information is not intended to replace advice given to you by your health care provider. Make sure you discuss any questions you have with your health care provider. Document Revised: 01/20/2021 Document Reviewed: 10/24/2019 Elsevier Patient Education  2024 Elsevier Inc.  Diabetes Mellitus and Foot Care Diabetes, also called diabetes mellitus, may cause problems with your feet and legs because of poor blood flow (circulation). Poor circulation may make your skin: Become thinner and drier. Break more easily. Heal more slowly. Peel and crack. You may also have nerve damage (neuropathy). This can cause decreased feeling in your legs and feet. This means that you may not notice minor injuries to your feet that could lead to more serious problems. Finding and treating problems early is the best way to prevent future foot problems. How to care for your feet Foot hygiene  Wash your feet daily with warm water and mild soap. Do not use hot water. Then, pat your feet and the areas between your toes until they are fully dry. Do not soak your feet. This can dry  your skin. Trim your toenails straight across. Do not dig under them or around the cuticle. File the edges of your nails with an emery board or nail file. Apply a moisturizing lotion or petroleum jelly to the skin on your feet and to dry, brittle toenails. Use lotion that does not contain alcohol and is  unscented. Do not apply lotion between your toes. Shoes and socks Wear clean socks or stockings every day. Make sure they are not too tight. Do not wear knee-high stockings. These may decrease blood flow to your legs. Wear shoes that fit well and have enough cushioning. Always look in your shoes before you put them on to be sure there are no objects inside. To break in new shoes, wear them for just a few hours a day. This prevents injuries on your feet. Wounds, scrapes, corns, and calluses  Check your feet daily for blisters, cuts, bruises, sores, and redness. If you cannot see the bottom of your feet, use a mirror or ask someone for help. Do not cut off corns or calluses or try to remove them with medicine. If you find a minor scrape, cut, or break in the skin on your feet, keep it and the skin around it clean and dry. You may clean these areas with mild soap and water. Do not clean the area with peroxide, alcohol, or iodine. If you have a wound, scrape, corn, or callus on your foot, look at it several times a day to make sure it is healing and not infected. Check for: Redness, swelling, or pain. Fluid or blood. Warmth. Pus or a bad smell. General tips Do not cross your legs. This may decrease blood flow to your feet. Do not use heating pads or hot water bottles on your feet. They may burn your skin. If you have lost feeling in your feet or legs, you may not know this is happening until it is too late. Protect your feet from hot and cold by wearing shoes, such as at the beach or on hot pavement. Schedule a complete foot exam at least once a year or more often if you have foot problems. Report any cuts, sores, or bruises to your health care provider right away. Where to find more information American Diabetes Association: diabetes.org Association of Diabetes Care & Education Specialists: diabeteseducator.org Contact a health care provider if: You have a condition that increases your risk of  infection, and you have any cuts, sores, or bruises on your feet. You have an injury that is not healing. You have redness on your legs or feet. You feel burning or tingling in your legs or feet. You have pain or cramps in your legs and feet. Your legs or feet are numb. Your feet always feel cold. You have pain around any toenails. Get help right away if: You have a wound, scrape, corn, or callus on your foot and: You have signs of infection. You have a fever. You have a red line going up your leg. This information is not intended to replace advice given to you by your health care provider. Make sure you discuss any questions you have with your health care provider. Document Revised: 11/10/2021 Document Reviewed: 11/10/2021 Elsevier Patient Education  2024 Elsevier Inc.  Screening for Type 2 Diabetes  A screening test for type 2 diabetes (type 2 diabetes mellitus) is a blood test to measure your blood sugar (glucose) level. This test is done to check for early signs of diabetes, before you develop  symptoms.  Type 2 diabetes is a long-term (chronic) disease. In type 2 diabetes, one or both of these problems may be present: The pancreas does not make enough of a hormone called insulin. Cells in the body do not respond properly to insulin that the body makes (insulin resistance). Normally, insulin allows blood sugar (glucose) to enter cells in the body. The cells use glucose for energy. Insulin resistance or lack of insulin causes excess glucose to build up in the blood instead of going into cells. This results in high blood glucose levels (hyperglycemia), which can cause many complications. You may be screened for type 2 diabetes as part of your regular health care, especially if you have a high risk for diabetes. Screening can help to identify type 2 diabetes at its early stage (prediabetes). Identifying and treating prediabetes may delay or prevent the development of type 2 diabetes. Tell a  health care provider about: All medicines you are taking, including vitamins, herbs, eye drops, creams, and over-the-counter medicines. Any bleeding problems you have. Any medical conditions you have. Whether you are pregnant or may be pregnant. Who should be screened for type 2 diabetes? Adults Adults age 88 and older. These adults should be screened once every three years. Adults who are any age, are overweight, and have one other risk factor. These adults should be screened once every three years. Adults who have normal blood glucose levels and two or more risk factors. These adults may be screened once every year (annually). Women who have had gestational diabetes in the past. These women should be screened once every three years. Pregnant women who have risk factors. These women should be screened at their first prenatal visit and again between weeks 24 and 28 of pregnancy. Children and adolescents Children and adolescents should be screened for type 2 diabetes if they are overweight and have any of the following risk factors: A family history of type 2 diabetes. Being a member of a high-risk ethnic group. Signs of insulin resistance or conditions that are associated with insulin resistance. A mother who had gestational diabetes while pregnant. Screening should be done at least once every three years, starting at age 20 or at the onset of puberty, whichever comes first. Your health care provider or your child's health care provider may recommend having a screening more or less often. What are the risk factors for type 2 diabetes? The following are factors that may make you more likely to develop type 2 diabetes and can be modified: Not getting enough exercise. Having high blood pressure. Having low levels of good cholesterol (HDL-C) or high levels of blood fats (triglycerides). Having high blood glucose in a previous blood test. Being overweight or obese. The following are factors  that may make you more likely to develop type 2 diabetes and can not be modified: Having a parent or sibling (first-degree relative) who has diabetes. Being of American-Indian, African-American, Hispanic/Latino, Asian, or Pacific Islander descent. Being older than age 5. Having a history of diabetes during pregnancy (gestational diabetes). Having certain diseases or conditions that may be caused by insulin resistance, including: Acanthosis nigricans. This is a condition that causes dark skin on the neck, armpits, and groin. Polycystic ovary syndrome (PCOS). Cardiovascular heart disease. What happens during screening? During screening, your health care provider may ask questions about: Your health and your risk factors, including your activity level and any medical conditions that you have. The health of your first-degree relatives. Past pregnancies, if this applies. Your  health care provider will also do a physical exam, including a blood pressure measurement and blood tests. There are four blood tests that can be used to screen for type 2 diabetes. You may have one or more of the following: A fasting blood glucose (FBG) test. You will not be allowed to eat (you will fast) for 8 hours or more before a blood sample is taken. A random blood glucose test. This test checks your blood glucose at any time of the day regardless of when you ate. An oral glucose tolerance test (OGTT). This test measures your blood glucose at two times: After you have not eaten (have fasted) overnight. This is your baseline glucose level. Two hours after you drink a glucose-containing beverage. An A1C (hemoglobin A1C) blood test. This test provides information about blood glucose control over the previous 2-3 months. What do the results mean? Your test results are a measurement of how much glucose is in your blood. Normal blood glucose levels mean that you do not have diabetes or prediabetes. High blood glucose levels  may mean that you have prediabetes or diabetes. Depending on the results, other tests may be needed to confirm the diagnosis. You may be diagnosed with type 2 diabetes if: Your FBG level is 126 mg/dL (7.0 mmol/L) or higher. Your random blood glucose level is 200 mg/dL (16.1 mmol/L) or higher. Your A1C level is 6.5% or higher. Your OGTT result is higher than 200 mg/dL (09.6 mmol/L). These blood tests may be repeated to confirm your diagnosis. Talk with your health care provider about what your results mean. Summary A screening test for type 2 diabetes (type 2 diabetes mellitus) is a blood test to measure your blood sugar (glucose) level. Know what your risk factors are for developing type 2 diabetes. If you are at risk, get screening tests as often as told by your health care provider. Screening may help you identify type 2 diabetes at its early stage (prediabetes). Identifying and treating prediabetes may delay or prevent the development of type 2 diabetes. This information is not intended to replace advice given to you by your health care provider. Make sure you discuss any questions you have with your health care provider. Document Revised: 08/03/2020 Document Reviewed: 08/03/2020 Elsevier Patient Education  2024 [employer].

## 2023-01-04 NOTE — Progress Notes (Signed)
 Because this visit was a virtual/telehealth visit,  certain criteria was not obtained, such a blood pressure, CBG if patient is a diabetic, and timed up and go. Any medications not marked as "taking" was not mentioned during the medication reconciliation part of the visit. Any vitals not documented were not able to be obtained due to this being a telehealth visit. Vitals documented are verbally provided by the patient.   Subjective:   Frances Hughes is a 67 y.o. female who presents for Medicare Annual (Subsequent) preventive examination.  Visit Complete: Virtual  I connected with  Frances Hughes on 01/04/23 by a audio enabled telemedicine application and verified that I am speaking with the correct person using two identifiers.  Patient Location: Home  Provider Location: Home Office  I discussed the limitations of evaluation and management by telemedicine. The patient expressed understanding and agreed to proceed.  Patient Medicare AWV questionnaire was completed by the patient on 01/02/2023; I have confirmed that all information answered by patient is correct and no changes since this date.  Review of Systems     Cardiac Risk Factors include: advanced age (>26men, >11 women);diabetes mellitus;dyslipidemia;hypertension;obesity (BMI >30kg/m2);sedentary lifestyle     Objective:    Today's Vitals   01/04/23 1115  Weight: 237 lb (107.5 kg)  Height: 5\' 3"  (1.6 m)   Body mass index is 41.98 kg/m.     01/04/2023   11:15 AM 03/01/2022    1:41 PM 01/26/2022    9:22 AM 01/25/2022   11:50 AM 12/08/2021   11:33 AM 08/31/2020   11:34 AM 03/19/2020    2:05 PM  Advanced Directives  Does Patient Have a Medical Advance Directive? No No Yes Yes Yes Yes Yes  Type of Theme park manager;Living will Healthcare Power of [place];Living will Healthcare Power of [place];Living will Living will  Does patient want to make changes to medical advance directive?    No -  Patient declined No - Patient declined No - Patient declined No - Patient declined  Copy of Healthcare Power of Attorney in Chart?   No - copy requested No - copy requested No - copy requested No - copy requested No - copy requested  Would patient like information on creating a medical advance directive? No - Patient declined No - Patient declined  No - Patient declined No - Patient declined      Current Medications (verified) Outpatient Encounter Medications as of 01/04/2023  Medication Sig   anastrozole (ARIMIDEX) 1 MG tablet Take 1 tablet (1 mg total) by mouth daily. Start 06/08/2020   aspirin 81 MG EC tablet Take 81 mg by mouth 3 (three) times a week. Swallow whole.   atorvastatin (LIPITOR) 40 MG tablet TAKE 1 TABLET(40 MG) BY MOUTH DAILY   Blood Glucose Monitoring Suppl (BLOOD GLUCOSE MONITOR SYSTEM) W/DEVICE KIT Please dispense based on patient and insurance preference. Use to monitor fasting blood sugar 1x daily. Dx: E11.9   cloNIDine (CATAPRES) 0.2 MG tablet Take 1 tablet (0.2 mg total) by mouth 2 (two) times daily.   furosemide (LASIX) 20 MG tablet TAKE 1 TABLET(20 MG) BY MOUTH DAILY AS NEEDED   Glucose Blood (BLOOD GLUCOSE TEST STRIPS) STRP Please dispense based on patient and insurance preference. Use to monitor fasting blood sugar 1x daily. Dx: E11.9   Lancets 28G MISC Please dispense based on patient and insurance preference. Use to monitor fasting blood sugar 1x daily. Dx: E11.9   levothyroxine (SYNTHROID) 112 MCG  tablet Take 1 tablet (112 mcg total) by mouth daily before breakfast.   losartan (COZAAR) 100 MG tablet Take 1 tablet (100 mg total) by mouth daily.   metFORMIN (GLUCOPHAGE) 500 MG tablet Take 1 tablet (500 mg total) by mouth daily with breakfast.   Multiple Vitamin (MULTIVITAMIN WITH MINERALS) TABS tablet Take 1 tablet by mouth daily.   potassium chloride SA (KLOR-CON M) 20 MEQ tablet TAKE 1 TABLET BY MOUTH DAILY (Patient taking differently: Take 20 mEq by mouth daily as  needed (when urinating frequently).)   No facility-administered encounter medications on file as of 01/04/2023.    Allergies (verified) Patient has no known allergies.   History: Past Medical History:  Diagnosis Date   Adrenal mass, left (HCC)    Breast cancer (HCC)    Degenerative disc disease at L5-S1 level    Depression 07/15/2011   DM (diabetes mellitus) (HCC)    Type II controlled   Enlarged heart    Family history of breast cancer    Family history of colon cancer    Family history of pancreatic cancer    Family history of stomach cancer    Grave's disease    History of kidney stones    HTN (hypertension)    Hyperlipidemia    Hypothyroidism    Lymphocytosis    Nephrolithiasis    OSA on CPAP    Past Surgical History:  Procedure Laterality Date   ABDOMINAL HYSTERECTOMY     BREAST BIOPSY Left 01/06/2020   BREAST LUMPECTOMY WITH RADIOACTIVE SEED LOCALIZATION Left 02/19/2020   Procedure: LEFT BREAST LUMPECTOMY WITH RADIOACTIVE SEED LOCALIZATION;  Surgeon: Abigail Miyamoto, MD;  Location: [facility];  Service: General;  Laterality: Left;   COLONOSCOPY  2011   normal colonoscopy   COLONOSCOPY WITH PROPOFOL N/A 04/16/2018   Procedure: COLONOSCOPY WITH PROPOFOL;  Surgeon: Corbin Ade, MD;  [facility];  Service: Endoscopy;  Laterality: N/A;  9:45am   COLONOSCOPY WITH PROPOFOL N/A 01/26/2022   Procedure: COLONOSCOPY WITH PROPOFOL;  Surgeon: Corbin Ade, MD;  Location: [facility];  Service: Endoscopy;  Laterality: N/A;  11:15 am   HERNIA REPAIR  2011   LITHOTRIPSY     POLYPECTOMY  04/16/2018   Procedure: POLYPECTOMY;  Surgeon: Corbin Ade, MD;  [facility];  Service: Endoscopy;;  (colon)   POLYPECTOMY  01/26/2022   Procedure: POLYPECTOMY;  Surgeon: Corbin Ade, MD;  [facility];  Service: Endoscopy;;   S/P Hysterectomy  10/1996   with bilat SOO seconardt to fibroids   thyroid ablation  1986   Family  History  Problem Relation Age of Onset   Colon cancer Mother        d. 28   Cancer Mother        metastisis sarcoma   Colon cancer Sister 36   Head & neck cancer Sister        roof of mouth   Colon polyps Sister    Diabetes Father    Hypertension Father    Cancer Father    Lung cancer Father        d. 62   Colon polyps Sister    Obesity Sister    Esophageal cancer Brother    Cancer Brother        cancer of eye   Aneurysm Maternal Aunt        brain   Cancer Maternal Uncle        NOS  COPD Maternal Uncle    Stomach cancer Maternal Grandfather 76   Heart attack Maternal Grandfather    Pancreatic cancer Paternal Grandmother        d. 3   Cancer Maternal Uncle        NOS - mother's pat 1/2 brother   Breast cancer Cousin        3 maternal cousins with breast cancer, some under 50, all different branches   Colon cancer Cousin        five mat first cousins, 4 were siblings   Cancer Cousin        2 mat 1/2 cousins with NOS cancer   Social History   Socioeconomic History   Marital status: Married    Spouse name: Pavandeep Lawler   Number of children: Not on file   Years of education: 12   Highest education level: 12th grade  Occupational History   Not on file  Tobacco Use   Smoking status: Former    Current packs/day: 0.00    Average packs/day: 0.8 packs/day for 19.3 years (14.5 ttl pk-yrs)    Types: Cigarettes    Start date: 01/30/1969    Quit date: 05/23/1988    Years since quitting: 34.6    Passive exposure: Past   Smokeless tobacco: Never   Tobacco comments:    I have not smoked in over 34 years  Vaping Use   Vaping status: Never Used  Substance and Sexual Activity   Alcohol use: No   Drug use: No   Sexual activity: Yes    Partners: Male    Comment: Hysterectomy  Other Topics Concern   Not on file  Social History Narrative   Not on file   Social Determinants of Health   Financial Resource Strain: Low Risk  (01/02/2023)   Overall Financial Resource  Strain (CARDIA)    Difficulty of Paying Living Expenses: Not very hard  Food Insecurity: No Food Insecurity (01/02/2023)   Hunger Vital Sign    Worried About Running Out of Food in the Last Year: Never true    Ran Out of Food in the Last Year: Never true  Transportation Needs: No Transportation Needs (01/02/2023)   PRAPARE - Administrator, Civil Service (Medical): No    Lack of Transportation (Non-Medical): No  Physical Activity: Insufficiently Active (01/02/2023)   Exercise Vital Sign    Days of Exercise per Week: 1 day    Minutes of Exercise per Session: 20 min  Stress: Stress Concern Present (01/02/2023)   [employer] of Occupational Health - Occupational Stress Questionnaire    Feeling of Stress : Rather much  Social Connections: Socially Integrated (01/02/2023)   Social Connection and Isolation Panel [NHANES]    Frequency of Communication with Friends and Family: More than three times a week    Frequency of Social Gatherings with Friends and Family: Twice a week    Attends Religious Services: More than 4 times per year    Active Member of [employer] or Organizations: Yes    Attends Engineer, structural: More than 4 times per year    Marital Status: Married    Tobacco Counseling Counseling given: Yes Tobacco comments: I have not smoked in over 34 years   Clinical Intake:  Pre-visit preparation completed: Yes  Pain : No/denies pain     BMI - recorded: 41.98 Nutritional Status: BMI > 30  Obese Nutritional Risks: None Diabetes: Yes CBG done?: No (telehealth visit. unable to obtain  cbg. pt also states she doesn't check her cbg's regularly .) Did pt. bring in CBG monitor from home?: No  How often do you need to have someone help you when you read instructions, pamphlets, or other written materials from your doctor or pharmacy?: 1 - Never  Interpreter Needed?: No  Information entered by ::  , CMA   Activities of Daily Living     01/02/2023    3:48 PM 03/01/2022    1:40 PM  In your present state of health, do you have any difficulty performing the following activities:  Hearing? 0 0  Vision? 0 0  Difficulty concentrating or making decisions? 0 0  Walking or climbing stairs? 0 1  Comment  Unsteady Balance/Gait  Dressing or bathing? 0 0  Doing errands, shopping? 0 0  Preparing Food and eating ? N N  Using the Toilet? N N  In the past six months, have you accidently leaked urine? Y Y  Comment  Urinary Incontinence  Do you have problems with loss of bowel control? N N  Managing your Medications? N N  Managing your Finances? N N  Housekeeping or managing your Housekeeping? N N    Patient Care Team: Anabel Halon, MD as PCP - General (Internal Medicine) Wyline Mood Dorothe Pea, MD as PCP - Cardiology (Cardiology) Jena Gauss Gerrit Friends, MD as Consulting Physician (Gastroenterology) Pershing Proud, RN as Oncology Nurse Navigator Donnelly Angelica, RN as Oncology Nurse Navigator Dorothy Puffer, MD as Consulting Physician (Radiation Oncology) Abigail Miyamoto, MD as Consulting Physician (General Surgery) Rachel Moulds, MD as Consulting Physician (Hematology and Oncology)  Indicate any recent Medical Services you may have received from other than [facility] providers in the past year (date may be approximate).     Assessment:   This is a routine wellness examination for [facility].  Hearing/Vision screen Hearing Screening - Comments:: Patient denies any hearing difficulties.   Vision Screening - Comments:: Patient states they wear reading glasses only.  She is UTD on yearly eye exams with [facility]  Dietary issues and exercise activities discussed:     Goals Addressed             This Visit's Progress    Patient Stated       "Get healthier and lose a little weight."       Depression Screen    01/04/2023   11:19 AM 08/24/2022   10:17 AM 03/23/2022   10:17 AM 03/01/2022    1:38 PM 02/09/2022    2:44 PM  12/08/2021   11:33 AM 12/08/2021   11:31 AM  PHQ 2/9 Scores  PHQ - 2 Score 2 0 0 0 1 0 0  PHQ- 9 Score 4 0         Fall Risk    01/02/2023    3:48 PM 08/24/2022   10:17 AM 03/23/2022   10:17 AM 03/01/2022    1:30 PM 02/09/2022    2:44 PM  Fall Risk   Falls in the past year? 0 0 0 0 0  Number falls in past yr: 0 0 0 0 0  Injury with Fall? 0 0 0 0 0  Risk for fall due to : No Fall Risks  No Fall Risks No Fall Risks No Fall Risks  Follow up Falls prevention discussed  Falls evaluation completed Falls prevention discussed;Education provided;Falls evaluation completed Falls evaluation completed    MEDICARE RISK AT HOME:   TIMED UP AND GO:  Was the test  performed?  No    Cognitive Function:    12/08/2021   11:34 AM  MMSE - Mini Mental State Exam  Not completed: Unable to complete        01/04/2023   11:18 AM 12/08/2021   11:34 AM  6CIT Screen  What Year? 0 points 0 points  What month? 0 points 0 points  What time? 0 points 0 points  Count back from 20 0 points 0 points  Months in reverse 0 points 0 points  Repeat phrase 0 points 0 points  Total Score 0 points 0 points    Immunizations Immunization History  Administered Date(s) Administered   Fluad Quad(high Dose 65+) 02/08/2019, 03/05/2020, 02/09/2022   Influenza-Unspecified 03/21/2011, 03/20/2015   Moderna Sars-Covid-2 Vaccination 07/08/2019, 07/31/2019   PNEUMOCOCCAL CONJUGATE-20 09/29/2021   Pneumococcal Polysaccharide-23 04/13/2007, 03/05/2020   Tdap 09/14/2012    TDAP status: Due, Education has been provided regarding the importance of this vaccine. Advised may receive this vaccine at local pharmacy or Health Dept. Aware to provide a copy of the vaccination record if obtained from local pharmacy or Health Dept. Verbalized acceptance and understanding.  Flu Vaccine status: Due, Education has been provided regarding the importance of this vaccine. Advised may receive this vaccine at local pharmacy or Health Dept.  Aware to provide a copy of the vaccination record if obtained from local pharmacy or Health Dept. Verbalized acceptance and understanding.  Pneumococcal vaccine status: Up to date  Covid-19 vaccine status: Information provided on how to obtain vaccines.   Qualifies for Shingles Vaccine? Yes   Zostavax completed No   Shingrix Completed?: No.    Education has been provided regarding the importance of this vaccine. Patient has been advised to call insurance company to determine out of pocket expense if they have not yet received this vaccine. Advised may also receive vaccine at local pharmacy or Health Dept. Verbalized acceptance and understanding.  Screening Tests Health Maintenance  Topic Date Due   Zoster Vaccines- Shingrix (1 of 2) Never done   COVID-19 Vaccine (3 - Moderna risk series) 08/28/2019   DTaP/Tdap/Td (2 - Td or Tdap) 09/15/2022   Diabetic kidney evaluation - Urine ACR  09/30/2022   FOOT EXAM  09/30/2022   Medicare Annual Wellness (AWV)  12/09/2022   OPHTHALMOLOGY EXAM  12/09/2022   INFLUENZA VACCINE  12/22/2022   MAMMOGRAM  03/03/2023   DEXA SCAN  03/11/2023   HEMOGLOBIN A1C  05/06/2023   Diabetic kidney evaluation - eGFR measurement  10/27/2023   Colonoscopy  01/27/2027   Pneumonia Vaccine 48+ Years old  Completed   Hepatitis C Screening  Completed   HPV VACCINES  Aged Out    Health Maintenance  Health Maintenance Due  Topic Date Due   Zoster Vaccines- Shingrix (1 of 2) Never done   COVID-19 Vaccine (3 - Moderna risk series) 08/28/2019   DTaP/Tdap/Td (2 - Td or Tdap) 09/15/2022   Diabetic kidney evaluation - Urine ACR  09/30/2022   FOOT EXAM  09/30/2022   Medicare Annual Wellness (AWV)  12/09/2022   OPHTHALMOLOGY EXAM  12/09/2022   INFLUENZA VACCINE  12/22/2022    Colorectal cancer screening: Type of screening: Colonoscopy. Completed 01/26/2022. Repeat every 10 years  Mammogram status: Completed 03/02/2022. Repeat every year  Bone Density status: Ordered  01/04/2023. Pt provided with contact info and advised to call to schedule appt. Previous bone density scan on 03/10/2021 showed osteopenia  Lung Cancer Screening: (Low Dose CT Chest recommended if Age 40-80 years, 61  pack-year currently smoking OR have quit w/in 15years.) does not qualify.    Additional Screening:  Hepatitis C Screening: does not qualify; Completed 10/13/2017  Vision Screening: Recommended annual ophthalmology exams for early detection of glaucoma and other disorders of the eye. Is the patient up to date with their annual eye exam?  Yes  Who is the provider or what is the name of the office in which the patient attends annual eye exams? [facility] If pt is not established with a provider, would they like to be referred to a provider to establish care? No .   Dental Screening: Recommended annual dental exams for proper oral hygiene  Diabetic Foot Exam: Diabetic Foot Exam: Overdue, Pt has been advised about the importance in completing this exam. Pt is scheduled for diabetic foot exam on September 2024.  Community Resource Referral / Chronic Care Management: CRR required this visit?  No   CCM required this visit?  No     Plan:     I have personally reviewed and noted the following in the patient's chart:   Medical and social history Use of alcohol, tobacco or illicit drugs  Current medications and supplements including opioid prescriptions. Patient is not currently taking opioid prescriptions. Functional ability and status Nutritional status Physical activity Advanced directives List of other physicians Hospitalizations, surgeries, and ER visits in previous 12 months Vitals Screenings to include cognitive, depression, and falls Referrals and appointments  In addition, I have reviewed and discussed with patient certain preventive protocols, quality metrics, and best practice recommendations. A written personalized care plan for preventive services as  well as general preventive health recommendations were provided to patient.     Jordan Hawks , CMA   01/04/2023   After Visit Summary: (MyChart) Due to this being a telephonic visit, the after visit summary with patients personalized plan was offered to patient via MyChart   Nurse Notes: Last office notes from patients eye doctor have been requested.

## 2023-01-24 ENCOUNTER — Other Ambulatory Visit: Payer: Self-pay | Admitting: "Endocrinology

## 2023-02-03 ENCOUNTER — Other Ambulatory Visit: Payer: Self-pay | Admitting: Internal Medicine

## 2023-02-03 DIAGNOSIS — I10 Essential (primary) hypertension: Secondary | ICD-10-CM

## 2023-02-13 ENCOUNTER — Ambulatory Visit: Payer: Medicare Other | Admitting: Internal Medicine

## 2023-02-13 ENCOUNTER — Other Ambulatory Visit: Payer: Self-pay | Admitting: Internal Medicine

## 2023-02-13 ENCOUNTER — Encounter: Payer: Self-pay | Admitting: Internal Medicine

## 2023-02-13 VITALS — BP 122/60 | HR 76 | Ht 63.0 in | Wt 250.2 lb

## 2023-02-13 DIAGNOSIS — Z23 Encounter for immunization: Secondary | ICD-10-CM | POA: Diagnosis not present

## 2023-02-13 DIAGNOSIS — R600 Localized edema: Secondary | ICD-10-CM

## 2023-02-13 DIAGNOSIS — E1169 Type 2 diabetes mellitus with other specified complication: Secondary | ICD-10-CM | POA: Diagnosis not present

## 2023-02-13 DIAGNOSIS — Z0001 Encounter for general adult medical examination with abnormal findings: Secondary | ICD-10-CM

## 2023-02-13 DIAGNOSIS — B351 Tinea unguium: Secondary | ICD-10-CM | POA: Diagnosis not present

## 2023-02-13 DIAGNOSIS — Z7984 Long term (current) use of oral hypoglycemic drugs: Secondary | ICD-10-CM | POA: Diagnosis not present

## 2023-02-13 DIAGNOSIS — I10 Essential (primary) hypertension: Secondary | ICD-10-CM | POA: Diagnosis not present

## 2023-02-13 DIAGNOSIS — G4733 Obstructive sleep apnea (adult) (pediatric): Secondary | ICD-10-CM | POA: Diagnosis not present

## 2023-02-13 DIAGNOSIS — E890 Postprocedural hypothyroidism: Secondary | ICD-10-CM | POA: Diagnosis not present

## 2023-02-13 MED ORDER — AMLODIPINE BESYLATE 5 MG PO TABS: 5.0000 mg | ORAL_TABLET | Freq: Every day | ORAL | 3 refills | Status: DC

## 2023-02-13 MED ORDER — TERBINAFINE HCL 250 MG PO TABS: 250.0000 mg | ORAL_TABLET | Freq: Every day | ORAL | 0 refills | Status: DC

## 2023-02-13 MED ORDER — CLONIDINE HCL 0.2 MG PO TABS: 0.2000 mg | ORAL_TABLET | Freq: Two times a day (BID) | ORAL | 1 refills | Status: DC

## 2023-02-13 NOTE — Assessment & Plan Note (Addendum)
Lab Results  Component Value Date   TSH 0.529 10/27/2022   On levothyroxine 112 mcg QD Followed by Endocrinology

## 2023-02-13 NOTE — Assessment & Plan Note (Signed)
Lab Results  Component Value Date   HGBA1C 6.0 11/04/2022   Well-controlled Associated with HTN and HLD On metformin 500 mg twice daily Advised to follow diabetic diet On statin and ARB F/u CMP and lipid panel Diabetic eye exam: Advised to follow up with Ophthalmology for diabetic eye exam

## 2023-02-13 NOTE — Patient Instructions (Addendum)
Please continue to take medications as prescribed.  Please continue to follow low carb diet and perform moderate exercise/walking as tolerated.  Please consider getting Shingrix and Tdap vaccine at local pharmacy.

## 2023-02-13 NOTE — Assessment & Plan Note (Addendum)
BP Readings from Last 1 Encounters:  02/13/23 122/60   Well-controlled with losartan 100 mg QD, amlodipine 5 mg QD and clonidine 0.2 mg twice daily DCed spironolactone as her BP is wnl without it Counseled for compliance with the medications Advised DASH diet and moderate exercise/walking, at least 150 mins/week

## 2023-02-13 NOTE — Assessment & Plan Note (Signed)
Uses CPAP, advised to use it regularly

## 2023-02-13 NOTE — Progress Notes (Unsigned)
Established Patient Office Visit  Subjective:  [name], female    DOB: [birth year]  Age: 67 y.o. MRN: [id number]  CC:  Chief Complaint  Patient presents with   Annual Exam    HPI Frances Hughes is a 67 y.o. female with past medical history of type II DM, HTN, HLD, peripheral neuropathy, adrenal adenoma, breast cancer, hypothyroidism and morbid obesity who presents for annual physical.  HTN: Her BP is well controlled now.  She has been taking amlodipine 5 mg QD, losartan 100 mg QD, and clonidine 0.2 mg BID currently. She has continued to take amlodipine instead of spironolactone. She denies any headache, dizziness, chest pain, dyspnea or palpitations. She has had intermittent leg swelling in the past. She denies any orthopnea or PND currently.  Type II DM: She takes metformin 500 mg QD. Her HbA1c was 6.0 in 06/24.  Denies polyuria or polyphagia currently.  Followed by Dr. Fransico Him.  Hypothyroidism: Her dose of levothyroxine was recently decreased to 112 mcg due to borderline elevated free T4.  Denies any recent change in weight or appetite.  Denies any tremors or palpitations.  She has been trying to walk/exercise regularly, but has been difficult due to her leg pain from neuropathy.  She had been gaining weight, although her weight has been stable overall since the last visit.   Past Medical History:  Diagnosis Date   Adrenal mass, left (HCC)    Breast cancer (HCC)    Degenerative disc disease at L5-S1 level    Depression 07/15/2011   DM (diabetes mellitus) (HCC)    Type II controlled   Enlarged heart    Family history of breast cancer    Family history of colon cancer    Family history of pancreatic cancer    Family history of stomach cancer    Grave's disease    History of kidney stones    HTN (hypertension)    Hyperlipidemia    Hypothyroidism    Lymphocytosis    Nephrolithiasis    OSA on CPAP     Past Surgical History:  Procedure Laterality Date    ABDOMINAL HYSTERECTOMY     BREAST BIOPSY Left 01/06/2020   BREAST LUMPECTOMY WITH RADIOACTIVE SEED LOCALIZATION Left 02/19/2020   Procedure: LEFT BREAST LUMPECTOMY WITH RADIOACTIVE SEED LOCALIZATION;  Surgeon: Abigail Miyamoto, MD;  Location: [facility];  Service: General;  Laterality: Left;   COLONOSCOPY  2011   normal colonoscopy   COLONOSCOPY WITH PROPOFOL N/A 04/16/2018   Procedure: COLONOSCOPY WITH PROPOFOL;  Surgeon: Corbin Ade, MD;  Location: [facility];  Service: Endoscopy;  Laterality: N/A;  9:45am   COLONOSCOPY WITH PROPOFOL N/A 01/26/2022   Procedure: COLONOSCOPY WITH PROPOFOL;  Surgeon: Corbin Ade, MD;  Location: [facility];  Service: Endoscopy;  Laterality: N/A;  11:15 am   HERNIA REPAIR  2011   LITHOTRIPSY     POLYPECTOMY  04/16/2018   Procedure: POLYPECTOMY;  Surgeon: Corbin Ade, MD;  Location: [facility];  Service: Endoscopy;;  (colon)   POLYPECTOMY  01/26/2022   Procedure: POLYPECTOMY;  Surgeon: Corbin Ade, MD;  Location: [facility];  Service: Endoscopy;;   S/P Hysterectomy  10/1996   with bilat SOO seconardt to fibroids   thyroid ablation  1986    Family History  Problem Relation Age of Onset   Colon cancer Mother        d. 62   Cancer Mother  metastisis sarcoma   Colon cancer Sister 46   Head & neck cancer Sister        roof of mouth   Colon polyps Sister    Diabetes Father    Hypertension Father    Cancer Father    Lung cancer Father        d. 44   Colon polyps Sister    Obesity Sister    Esophageal cancer Brother    Cancer Brother        cancer of eye   Aneurysm Maternal Aunt        brain   Cancer Maternal Uncle        NOS   COPD Maternal Uncle    Stomach cancer Maternal Grandfather 76   Heart attack Maternal Grandfather    Pancreatic cancer Paternal Grandmother        d. 76   Cancer Maternal Uncle        NOS - mother's pat 1/2 brother   Breast cancer Cousin        3 maternal cousins  with breast cancer, some under 50, all different branches   Colon cancer Cousin        five mat first cousins, 4 were siblings   Cancer Cousin        2 mat 1/2 cousins with NOS cancer    Social History   Socioeconomic History   Marital status: Married    Spouse name: Sharonna Karrer   Number of children: Not on file   Years of education: 12   Highest education level: 12th grade  Occupational History   Not on file  Tobacco Use   Smoking status: Former    Current packs/day: 0.00    Average packs/day: 0.8 packs/day for 19.3 years (14.5 ttl pk-yrs)    Types: Cigarettes    Start date: 01/30/1969    Quit date: 05/23/1988    Years since quitting: 34.7    Passive exposure: Past   Smokeless tobacco: Never   Tobacco comments:    I have not smoked in over 34 years  Vaping Use   Vaping status: Never Used  Substance and Sexual Activity   Alcohol use: No   Drug use: No   Sexual activity: Yes    Partners: Male    Comment: Hysterectomy  Other Topics Concern   Not on file  Social History Narrative   Not on file   Social Determinants of Health   Financial Resource Strain: Low Risk  (01/02/2023)   Overall Financial Resource Strain (CARDIA)    Difficulty of Paying Living Expenses: Not very hard  Food Insecurity: No Food Insecurity (01/02/2023)   Hunger Vital Sign    Worried About Running Out of Food in the Last Year: Never true    Ran Out of Food in the Last Year: Never true  Transportation Needs: No Transportation Needs (01/02/2023)   PRAPARE - Administrator, Civil Service (Medical): No    Lack of Transportation (Non-Medical): No  Physical Activity: Insufficiently Active (01/02/2023)   Exercise Vital Sign    Days of Exercise per Week: 1 day    Minutes of Exercise per Session: 20 min  Stress: Stress Concern Present (01/02/2023)   [employer] of Occupational Health - Occupational Stress Questionnaire    Feeling of Stress : Rather much  Social Connections: Socially  Integrated (01/02/2023)   Social Connection and Isolation Panel [NHANES]    Frequency of Communication with Friends and Family: More  than three times a week    Frequency of Social Gatherings with Friends and Family: Twice a week    Attends Religious Services: More than 4 times per year    Active Member of [employer] or Organizations: Yes    Attends Engineer, structural: More than 4 times per year    Marital Status: Married  Catering manager Violence: Not At Risk (01/04/2023)   Humiliation, Afraid, Rape, and Kick questionnaire    Fear of Current or Ex-Partner: No    Emotionally Abused: No    Physically Abused: No    Sexually Abused: No    Outpatient Medications Prior to Visit  Medication Sig Dispense Refill   amLODipine (NORVASC) 5 MG tablet Take 5 mg by mouth daily.     anastrozole (ARIMIDEX) 1 MG tablet Take 1 tablet (1 mg total) by mouth daily. Start 06/08/2020 90 tablet 4   aspirin 81 MG EC tablet Take 81 mg by mouth 3 (three) times a week. Swallow whole.     atorvastatin (LIPITOR) 40 MG tablet TAKE 1 TABLET(40 MG) BY MOUTH DAILY 90 tablet 1   Blood Glucose Monitoring Suppl (BLOOD GLUCOSE MONITOR SYSTEM) W/DEVICE KIT Please dispense based on patient and insurance preference. Use to monitor fasting blood sugar 1x daily. Dx: E11.9 1 each 0   furosemide (LASIX) 20 MG tablet TAKE 1 TABLET(20 MG) BY MOUTH DAILY AS NEEDED 30 tablet 1   Glucose Blood (BLOOD GLUCOSE TEST STRIPS) STRP Please dispense based on patient and insurance preference. Use to monitor fasting blood sugar 1x daily. Dx: E11.9 100 each 0   Lancets 28G MISC Please dispense based on patient and insurance preference. Use to monitor fasting blood sugar 1x daily. Dx: E11.9 100 each 0   levothyroxine (SYNTHROID) 112 MCG tablet Take 1 tablet (112 mcg total) by mouth daily before breakfast. 90 tablet 1   losartan (COZAAR) 100 MG tablet TAKE 1 TABLET(100 MG) BY MOUTH DAILY 90 tablet 1   metFORMIN (GLUCOPHAGE) 500 MG tablet Take 1  tablet (500 mg total) by mouth daily with breakfast. 90 tablet 1   Multiple Vitamin (MULTIVITAMIN WITH MINERALS) TABS tablet Take 1 tablet by mouth daily.     potassium chloride SA (KLOR-CON M) 20 MEQ tablet TAKE 1 TABLET BY MOUTH DAILY (Patient taking differently: Take 20 mEq by mouth daily as needed (when urinating frequently).) 90 tablet 0   cloNIDine (CATAPRES) 0.2 MG tablet Take 1 tablet (0.2 mg total) by mouth 2 (two) times daily. 180 tablet 1   No facility-administered medications prior to visit.    No Known Allergies  ROS Review of Systems  Constitutional:  Positive for fatigue. Negative for chills and fever.  HENT:  Negative for congestion, sinus pressure, sinus pain and sore throat.   Eyes:  Negative for pain and discharge.  Respiratory:  Negative for cough and shortness of breath.   Cardiovascular:  Negative for chest pain and palpitations.  Gastrointestinal:  Negative for abdominal pain, diarrhea, nausea and vomiting.  Endocrine: Negative for polydipsia and polyuria.  Genitourinary:  Negative for dysuria and hematuria.  Musculoskeletal:  Negative for neck pain and neck stiffness.  Skin:  Negative for rash.  Neurological:  Negative for dizziness and weakness.  Psychiatric/Behavioral:  Negative for agitation and behavioral problems.       Objective:    Physical Exam Vitals reviewed.  Constitutional:      General: She is not in acute distress.    Appearance: She is obese. She is not  diaphoretic.  HENT:     Head: Normocephalic and atraumatic.     Nose: Nose normal.     Mouth/Throat:     Mouth: Mucous membranes are moist.  Eyes:     General: No scleral icterus.    Extraocular Movements: Extraocular movements intact.  Cardiovascular:     Rate and Rhythm: Normal rate and regular rhythm.     Pulses: Normal pulses.     Heart sounds: Normal heart sounds. No murmur heard. Pulmonary:     Breath sounds: Normal breath sounds. No wheezing or rales.  Abdominal:      Palpations: Abdomen is soft.     Tenderness: There is no abdominal tenderness.  Musculoskeletal:     Cervical back: Neck supple. No tenderness.     Right lower leg: No edema.     Left lower leg: No edema.  Skin:    General: Skin is warm.     Findings: No rash.  Neurological:     General: No focal deficit present.     Mental Status: She is alert and oriented to person, place, and time.     Cranial Nerves: No cranial nerve deficit.     Sensory: No sensory deficit.     Motor: No weakness.  Psychiatric:        Mood and Affect: Mood normal.        Behavior: Behavior normal.     BP 122/60 (BP Location: Left Arm, Patient Position: Sitting, Cuff Size: Large)   Pulse 76   Ht 5\' 3"  (1.6 m)   Wt 250 lb 3.2 oz (113.5 kg)   SpO2 91%   BMI 44.32 kg/m  Wt Readings from Last 3 Encounters:  02/13/23 250 lb 3.2 oz (113.5 kg)  01/04/23 237 lb (107.5 kg)  11/04/22 246 lb (111.6 kg)    Lab Results  Component Value Date   TSH 0.529 10/27/2022   Lab Results  Component Value Date   WBC 8.2 03/08/2022   HGB 12.0 03/08/2022   HCT 37.6 03/08/2022   MCV 83.4 03/08/2022   PLT 223 03/08/2022   Lab Results  Component Value Date   NA 143 10/27/2022   K 3.8 10/27/2022   CO2 28 10/27/2022   GLUCOSE 103 (H) 10/27/2022   BUN 17 10/27/2022   CREATININE 0.72 10/27/2022   BILITOT 0.3 10/27/2022   ALKPHOS 119 10/27/2022   AST 14 10/27/2022   ALT 10 10/27/2022   PROT 6.9 10/27/2022   ALBUMIN 3.9 10/27/2022   CALCIUM 9.3 10/27/2022   ANIONGAP 3 (L) 03/08/2022   EGFR 91 10/27/2022   Lab Results  Component Value Date   CHOL 165 10/27/2022   Lab Results  Component Value Date   HDL 56 10/27/2022   Lab Results  Component Value Date   LDLCALC 96 10/27/2022   Lab Results  Component Value Date   TRIG 68 10/27/2022   Lab Results  Component Value Date   CHOLHDL 2.9 10/27/2022   Lab Results  Component Value Date   HGBA1C 6.0 11/04/2022      Assessment & Plan:   Problem List  Items Addressed This Visit       Cardiovascular and Mediastinum   Essential hypertension    BP Readings from Last 1 Encounters:  02/13/23 122/60   Well-controlled with losartan 100 mg QD, amlodipine 5 mg QD and clonidine 0.2 mg twice daily DCed spironolactone as her BP is wnl without it Counseled for compliance with the medications Advised DASH diet  and moderate exercise/walking, at least 150 mins/week      Relevant Medications   amLODipine (NORVASC) 5 MG tablet   cloNIDine (CATAPRES) 0.2 MG tablet     Respiratory   OSA (obstructive sleep apnea)    Uses CPAP, advised to use it regularly        Endocrine   Type 2 diabetes mellitus with other specified complication (HCC)    Lab Results  Component Value Date   HGBA1C 6.0 11/04/2022   Well-controlled Associated with HTN and HLD On metformin 500 mg twice daily Advised to follow diabetic diet On statin and ARB F/u CMP and lipid panel Diabetic eye exam: Advised to follow up with Ophthalmology for diabetic eye exam      Relevant Orders   Urine Microalbumin w/creat. ratio   Hypothyroidism following radioiodine therapy    Lab Results  Component Value Date   TSH 0.529 10/27/2022   On levothyroxine 112 mcg QD Followed by Endocrinology        Musculoskeletal and Integument   Onychomycosis    Started Terbinafine Has tried topical treatment, but did not improve Has seen Podiatry      Relevant Medications   terbinafine (LAMISIL) 250 MG tablet     Other   Encounter for general adult medical examination with abnormal findings - Primary    Physical exam as documented. Counseling done  re healthy lifestyle involving commitment to 150 minutes exercise per week, heart healthy diet, and attaining healthy weight.The importance of adequate sleep also discussed. Changes in health habits are decided on by the patient with goals and time frames  set for achieving them. Immunization and cancer screening needs are specifically  addressed at this visit.      Other Visit Diagnoses     Encounter for immunization       Relevant Orders   Flu Vaccine Trivalent High Dose (Fluad) (Completed)       Meds ordered this encounter  Medications   amLODipine (NORVASC) 5 MG tablet    Sig: Take 1 tablet (5 mg total) by mouth daily.    Dispense:  90 tablet    Refill:  3   cloNIDine (CATAPRES) 0.2 MG tablet    Sig: Take 1 tablet (0.2 mg total) by mouth 2 (two) times daily.    Dispense:  180 tablet    Refill:  1   terbinafine (LAMISIL) 250 MG tablet    Sig: Take 1 tablet (250 mg total) by mouth daily.    Dispense:  84 tablet    Refill:  0    Follow-up: Return in about 6 months (around 08/13/2023) for HTN and DM.    Anabel Halon, MD

## 2023-02-14 NOTE — Assessment & Plan Note (Signed)

## 2023-02-14 NOTE — Assessment & Plan Note (Signed)
Started Terbinafine ?Has tried topical treatment, but did not improve ?Has seen Podiatry ?

## 2023-02-15 LAB — MICROALBUMIN / CREATININE URINE RATIO
Creatinine, Urine: 17.6 mg/dL
Microalb/Creat Ratio: <17 mg/g creat (ref 0–29)
Microalbumin, Urine: <3 ug/mL

## 2023-03-09 ENCOUNTER — Other Ambulatory Visit: Payer: Self-pay

## 2023-03-09 ENCOUNTER — Inpatient Hospital Stay: Payer: Medicare Other | Attending: Hematology and Oncology | Admitting: Hematology and Oncology

## 2023-03-09 VITALS — BP 182/98 | HR 88 | Temp 97.7°F | Resp 18 | Wt 249.0 lb

## 2023-03-09 DIAGNOSIS — E119 Type 2 diabetes mellitus without complications: Secondary | ICD-10-CM | POA: Diagnosis not present

## 2023-03-09 DIAGNOSIS — E279 Disorder of adrenal gland, unspecified: Secondary | ICD-10-CM | POA: Insufficient documentation

## 2023-03-09 DIAGNOSIS — D0512 Intraductal carcinoma in situ of left breast: Secondary | ICD-10-CM

## 2023-03-09 DIAGNOSIS — M8580 Other specified disorders of bone density and structure, unspecified site: Secondary | ICD-10-CM | POA: Insufficient documentation

## 2023-03-09 DIAGNOSIS — Z170 Estrogen receptor positive status [ER+]: Secondary | ICD-10-CM | POA: Insufficient documentation

## 2023-03-09 DIAGNOSIS — E0500 Thyrotoxicosis with diffuse goiter without thyrotoxic crisis or storm: Secondary | ICD-10-CM | POA: Insufficient documentation

## 2023-03-09 DIAGNOSIS — Z79811 Long term (current) use of aromatase inhibitors: Secondary | ICD-10-CM | POA: Insufficient documentation

## 2023-03-09 DIAGNOSIS — I10 Essential (primary) hypertension: Secondary | ICD-10-CM | POA: Insufficient documentation

## 2023-03-09 NOTE — Progress Notes (Signed)
[facility]  [phone]) [phone] [phone]) [phone]     ID: Frances Hughes DOB: 1955/11/27  MR#: [id number]  OVF#:[id number]  Patient Care Team: Frances Halon, MD as PCP - General (Internal Medicine) Frances Hughes Frances Pea, MD as PCP - Cardiology (Cardiology) Rourk, Gerrit Friends, MD as Consulting Physician (Gastroenterology) Frances Proud, RN as Oncology Nurse Navigator Frances Angelica, RN as Oncology Nurse Navigator Frances Puffer, MD as Consulting Physician (Radiation Oncology) Frances Miyamoto, MD as Consulting Physician (General Surgery) Frances Moulds, MD as Consulting Physician (Hematology and Oncology) Patty, A. Azucena Kuba, MD (Ophthalmology) Frances Moulds, MD  CHIEF COMPLAINT: noninvasive breast cancer, estrogen receptor positive  CURRENT TREATMENT: anastrozole  INTERVAL HISTORY:  Frances Hughes returns today for follow up of her noninvasive breast cancer with her husband  The patient, with a history of Graves' disease, diabetes, and a history of breast surgery, presents for a routine follow-up. She reports that her endocrinologist recently reduced her metformin dosage from 500 mcg twice daily to once daily. She also sees a Production designer, theatre/television/film at [facility]. She reports that her Graves' disease is well controlled, but she has gained a few pounds since her medication was adjusted. She has not noticed any new breast changes, but she continues to experience some soreness and scar tissue formation post-surgery and post-radiation.  The patient also reports a nodule on her adrenal gland, which was last checked during a hospital stay. She has not noticed any changes in this nodule. She also reports bilateral hip pain, which is worse upon waking and improves throughout the day. She has been diagnosed with degenerative disc disease, but has not pursued physical therapy or surgery for this condition.  The patient also reports occasional back pain, which does not seem to  be related to movement. She has noticed this pain upon waking and sometimes during the day. She has recently started a chair-based dance exercise program.  Rest of the pertinent 10 point ROS reviewed and negative   COVID 19 VACCINATION STATUS: fully vaccinated Frances Hughes), x4 as of October 2022; had COVID November 2020   HISTORY OF CURRENT ILLNESS: From the original intake note:  Frances Hughes had routine screening mammography on 12/12/2019 showing a possible abnormality in the left breast. She underwent left diagnostic mammography with tomography at [facility] on 12/24/2019 showing: breast density category B; upper-outer left breast calcifications spanning 1.5 cm.  Accordingly on 01/06/2020 she proceeded to biopsy of the left breast area in question. The pathology from this procedure ([id number]) showed: ductal carcinoma in situ, low grade, with calcifications. Prognostic indicators significant for: estrogen receptor, 100% positive with strong staining intensity and progesterone receptor, 60% positive with weak staining intensity.   She opted to proceed with lumpectomy on 02/19/2020 under Dr. Magnus Hughes. Pathology from the procedure [id number]) showed: ductal carcinoma in situ, low to intermediate grade, 1.5 cm, with calcifications and focal necrosis; lobular neoplasia; margins not involved.  The patient's subsequent history is as detailed below.   PAST MEDICAL HISTORY: Past Medical History:  Diagnosis Date   Adrenal mass, left (HCC)    Breast cancer (HCC)    Degenerative disc disease at L5-S1 level    Depression 07/15/2011   DM (diabetes mellitus) (HCC)    Type II controlled   Enlarged heart    Family history of breast cancer    Family history of colon cancer    Family history of pancreatic cancer    Family history of stomach cancer    Grave's  disease    History of kidney stones    HTN (hypertension)    Hyperlipidemia    Hypothyroidism    Lymphocytosis    Nephrolithiasis     OSA on CPAP     PAST SURGICAL HISTORY: Past Surgical History:  Procedure Laterality Date   ABDOMINAL HYSTERECTOMY     BREAST BIOPSY Left 01/06/2020   BREAST LUMPECTOMY WITH RADIOACTIVE SEED LOCALIZATION Left 02/19/2020   Procedure: LEFT BREAST LUMPECTOMY WITH RADIOACTIVE SEED LOCALIZATION;  Surgeon: Frances Miyamoto, MD;  Location: [facility];  Service: General;  Laterality: Left;   COLONOSCOPY  2011   normal colonoscopy   COLONOSCOPY WITH PROPOFOL N/A 04/16/2018   Procedure: COLONOSCOPY WITH PROPOFOL;  Surgeon: Frances Ade, MD;  [facility];  Service: Endoscopy;  Laterality: N/A;  9:45am   COLONOSCOPY WITH PROPOFOL N/A 01/26/2022   Procedure: COLONOSCOPY WITH PROPOFOL;  Surgeon: Frances Ade, MD;  Location: [facility];  Service: Endoscopy;  Laterality: N/A;  11:15 am   HERNIA REPAIR  2011   LITHOTRIPSY     POLYPECTOMY  04/16/2018   Procedure: POLYPECTOMY;  Surgeon: Frances Ade, MD;  [facility];  Service: Endoscopy;;  (colon)   POLYPECTOMY  01/26/2022   Procedure: POLYPECTOMY;  Surgeon: Frances Ade, MD;  [facility];  Service: Endoscopy;;   S/P Hysterectomy  10/1996   with bilat SOO seconardt to fibroids   thyroid ablation  1986    FAMILY HISTORY: Family History  Problem Relation Age of Onset   Colon cancer Mother        d. 68   Cancer Mother        metastisis sarcoma   Colon cancer Sister 29   Head & neck cancer Sister        roof of mouth   Colon polyps Sister    Diabetes Father    Hypertension Father    Cancer Father    Lung cancer Father        d. 61   Colon polyps Sister    Obesity Sister    Esophageal cancer Brother    Cancer Brother        cancer of eye   Aneurysm Maternal Aunt        brain   Cancer Maternal Uncle        NOS   COPD Maternal Uncle    Stomach cancer Maternal Grandfather 76   Heart attack Maternal Grandfather    Pancreatic cancer Paternal Grandmother        d. 85    Cancer Maternal Uncle        NOS - mother's pat 1/2 brother   Breast cancer Cousin        3 maternal cousins with breast cancer, some under 50, all different branches   Colon cancer Cousin        five mat first cousins, 4 were siblings   Cancer Cousin        2 mat 1/2 cousins with NOS cancer  The patient's father died from lung cancer at the age of 9.  He worked in textile's and also smoked.  The patient's mother had "cancer all over her abdomen" and died from bowel perforation.  The patient tells me she has 4 cousins with colon cancer and a sister who died from colon cancer.  One brother died from esophageal cancer.   GYNECOLOGIC HISTORY:  No LMP recorded. Patient has had a hysterectomy. Menarche: 67  years old Age at first live birth: 67 years old GX P 4 HRT no  Hysterectomy? Yes, 10/1996, due to fibroids BSO? 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  Her husband Jerolyn Shin is retired from [employer] power.  They have twin daughters, Tamera Punt who works as an Environmental health practitioner in Acupuncturist who works in [place] for the [employer] and [employer], both 45 years old; 3d daughter is Hughes Chessman who works in Set designer, age 84; their son Denyse Amass died from "colloid cysts in the brain" at the age of 22.  The patient has 6 grandchildren.  She is a Control and instrumentation engineer    ADVANCED DIRECTIVES: In the absence of any documents to the contrary the patient's husband is her healthcare power of attorney.   HEALTH MAINTENANCE: Social History   Tobacco Use   Smoking status: Former    Current packs/day: 0.00    Average packs/day: 0.8 packs/day for 19.3 years (14.5 ttl pk-yrs)    Types: Cigarettes    Start date: 01/30/1969    Quit date: 05/23/1988    Years since quitting: 34.8    Passive exposure: Past   Smokeless tobacco: Never   Tobacco comments:    I have not smoked in over 34 years  Vaping Use   Vaping status:  Never Used  Substance Use Topics   Alcohol use: No   Drug use: No     Colonoscopy: 03/2018 (Dr. Jena Gauss), repeat recommended 2022  PAP: 01/2012, negative  Bone density: none on file   No Known Allergies  Current Outpatient Medications  Medication Sig Dispense Refill   amLODipine (NORVASC) 5 MG tablet Take 1 tablet (5 mg total) by mouth daily. 90 tablet 3   anastrozole (ARIMIDEX) 1 MG tablet Take 1 tablet (1 mg total) by mouth daily. Start 06/08/2020 90 tablet 4   aspirin 81 MG EC tablet Take 81 mg by mouth 3 (three) times a week. Swallow whole.     atorvastatin (LIPITOR) 40 MG tablet TAKE 1 TABLET(40 MG) BY MOUTH DAILY 90 tablet 1   Blood Glucose Monitoring Suppl (BLOOD GLUCOSE MONITOR SYSTEM) W/DEVICE KIT Please dispense based on patient and insurance preference. Use to monitor fasting blood sugar 1x daily. Dx: E11.9 1 each 0   cloNIDine (CATAPRES) 0.2 MG tablet Take 1 tablet (0.2 mg total) by mouth 2 (two) times daily. 180 tablet 1   furosemide (LASIX) 20 MG tablet TAKE 1 TABLET(20 MG) BY MOUTH DAILY AS NEEDED 30 tablet 1   Glucose Blood (BLOOD GLUCOSE TEST STRIPS) STRP Please dispense based on patient and insurance preference. Use to monitor fasting blood sugar 1x daily. Dx: E11.9 100 each 0   Lancets 28G MISC Please dispense based on patient and insurance preference. Use to monitor fasting blood sugar 1x daily. Dx: E11.9 100 each 0   levothyroxine (SYNTHROID) 112 MCG tablet Take 1 tablet (112 mcg total) by mouth daily before breakfast. 90 tablet 1   losartan (COZAAR) 100 MG tablet TAKE 1 TABLET(100 MG) BY MOUTH DAILY 90 tablet 1   metFORMIN (GLUCOPHAGE) 500 MG tablet Take 1 tablet (500 mg total) by mouth daily with breakfast. 90 tablet 1   Multiple Vitamin (MULTIVITAMIN WITH MINERALS) TABS tablet Take 1 tablet by mouth daily.     potassium chloride SA (KLOR-CON M) 20 MEQ tablet TAKE 1 TABLET BY MOUTH DAILY (Patient taking differently: Take 20 mEq by mouth daily as needed (when urinating  frequently).) 90 tablet 0   terbinafine (LAMISIL) 250 MG tablet Take 1 tablet (250 mg total) by mouth daily. 84 tablet 0   No current facility-administered medications for this visit.    OBJECTIVE: African-American woman who appears stated age  Vitals:   03/09/23 1305  BP: (!) 191/104  Pulse: 88  Resp: 18  Temp: 97.7 F (36.5 C)  SpO2: 92%      Body mass index is 44.11 kg/m.   Wt Readings from Last 3 Encounters:  03/09/23 249 lb (112.9 kg)  02/13/23 250 lb 3.2 oz (113.5 kg)  01/04/23 237 lb (107.5 kg)     ECOG FS:2 - Symptomatic, <50% confined to bed  Physical Exam Constitutional:      Appearance: Normal appearance.  Chest:     Comments: Asymmetry given the left breast lumpectomy and postsurgical scarring noted in axillary region.  No other concerning palpable masses or regional adenopathy. Musculoskeletal:        General: No swelling.     Cervical back: Normal range of motion and neck supple. No rigidity.  Lymphadenopathy:     Cervical: No cervical adenopathy.  Neurological:     Mental Status: She is alert.       LAB RESULTS:  CMP     Component Value Date/Time   NA 143 10/27/2022 1043   K 3.8 10/27/2022 1043   CL 101 10/27/2022 1043   CO2 28 10/27/2022 1043   GLUCOSE 103 (H) 10/27/2022 1043   GLUCOSE 140 (H) 03/08/2022 1244   BUN 17 10/27/2022 1043   CREATININE 0.72 10/27/2022 1043   CREATININE 0.86 03/08/2022 1244   CREATININE 0.68 06/12/2020 0930   CALCIUM 9.3 10/27/2022 1043   PROT 6.9 10/27/2022 1043   ALBUMIN 3.9 10/27/2022 1043   AST 14 10/27/2022 1043   AST 12 (L) 03/08/2022 1244   ALT 10 10/27/2022 1043   ALT 9 03/08/2022 1244   ALKPHOS 119 10/27/2022 1043   BILITOT 0.3 10/27/2022 1043   BILITOT 0.4 03/08/2022 1244   GFRNONAA >60 03/08/2022 1244   GFRNONAA 94 11/07/2018 0927   GFRAA >60 02/17/2020 0930   GFRAA 109 11/07/2018 0927    No results found for: "TOTALPROTELP", "ALBUMINELP", "A1GS", "A2GS", "BETS", "BETA2SER", "GAMS",  "MSPIKE", "SPEI"  Lab Results  Component Value Date   WBC 8.2 03/08/2022   NEUTROABS 5.1 03/08/2022   HGB 12.0 03/08/2022   HCT 37.6 03/08/2022   MCV 83.4 03/08/2022   PLT 223 03/08/2022    No results found for: "LABCA2"  No components found for: "GMWNUU725"  No results for input(s): "INR" in the last 168 hours.  No results found for: "LABCA2"  No results found for: "DGU440"  No results found for: "CAN125"  No results found for: "CAN153"  No results found for: "CA2729"  No components found for: "HGQUANT"  No results found for: "CEA1", "CEA" / No results found for: "CEA1", "CEA"   No results found for: "AFPTUMOR"  No results found for: "CHROMOGRNA"  No results found for: "KPAFRELGTCHN", "LAMBDASER", "KAPLAMBRATIO" (kappa/lambda light chains)  No results found for: "HGBA", "HGBA2QUANT", "HGBFQUANT", "HGBSQUAN" (Hemoglobinopathy evaluation)   No results found for: "LDH"  No results found for: "IRON", "TIBC", "IRONPCTSAT" (Iron and TIBC)  No results found for: "FERRITIN"  Urinalysis    Component Value Date/Time   COLORURINE YELLOW 07/17/2015 1234   APPEARANCEUR CLEAR 07/17/2015 1234   LABSPEC 1.020 07/17/2015 1234   PHURINE 6.5 07/17/2015 1234   GLUCOSEU NEGATIVE 07/17/2015 1234   HGBUR 2+ (  A) 07/17/2015 1234   HGBUR trace-intact 06/13/2008 1045   BILIRUBINUR NEGATIVE 07/17/2015 1234   KETONESUR NEGATIVE 07/17/2015 1234   PROTEINUR NEGATIVE 07/17/2015 1234   UROBILINOGEN 0.2 06/13/2008 1045   NITRITE NEGATIVE 07/17/2015 1234   LEUKOCYTESUR NEGATIVE 07/17/2015 1234    STUDIES: No results found.   ELIGIBLE FOR AVAILABLE RESEARCH PROTOCOL: Opted against COMET  ASSESSMENT: 67 y.o. [place], [place] woman status post left breast biopsy 01/06/2020 for ductal carcinoma in situ, grade 1, estrogen and progesterone receptor positive  (1) status post left lumpectomy 02/19/2020 for ductal carcinoma in situ grade 1 or 2, 1.5 cm, with negative margins.  (2)  adjuvant radiation completed 04/27/2020  (3) started anastrozole 06/09/2019  (4) genetics counseling discussed--patient opts against testing   PLAN: Patient is here for follow up on adjuvant anastrozole.  Breast Cancer Post-Treatment Reports of persistent soreness and scar tissue. No new changes noted on physical examination. -Continue current management and monitoring.  Graves' Disease Reports of weight gain after medication adjustment. -Recommend reevaluation by endocrinologist for potential medication adjustment.  Type 2 Diabetes Metformin dose recently reduced by endocrinologist. -Continue current management and monitoring.  Osteopenia T-score -2.0 on last bone density scan in October 2022. Currently not on calcium or vitamin D supplements. -Recommend starting calcium and vitamin D supplements. -Encourage weight-bearing exercises and walking. -Repeat bone density scan as ordered by primary care provider.  Hypertension Elevated blood pressure noted during visit. Patient reports inconsistent medication intake. -Advise patient to regularly monitor blood pressure at home. -Ensure consistent intake of antihypertensive medication.  Bilateral Hip Pain Reports of stiffness and pain, particularly upon waking and after sitting. -Recommend referral to physical therapy for evaluation and management.  Adrenal Nodule History of adrenal nodule, not recently evaluated. -Recommend follow-up evaluation to assess for potential contribution to hypertension.  General Health Maintenance -Order mammogram as it is due. -Encourage patient to continue with regular exercise program.  Total time: 30 min *Total Encounter Time as defined by the Centers for Medicare and Medicaid Services includes, in addition to the face-to-face time of a patient visit (documented in the note above) non-face-to-face time: obtaining and reviewing outside history, ordering and reviewing medications, tests or  procedures, care coordination (communications with other health care professionals or caregivers) and documentation in the medical record.

## 2023-03-25 ENCOUNTER — Ambulatory Visit
Admission: RE | Admit: 2023-03-25 | Discharge: 2023-03-25 | Disposition: A | Discharge status: Home / Self Care | Payer: Medicare Other | Source: Ambulatory Visit | Attending: Hematology and Oncology | Admitting: Hematology and Oncology

## 2023-03-25 DIAGNOSIS — Z853 Personal history of malignant neoplasm of breast: Secondary | ICD-10-CM | POA: Diagnosis not present

## 2023-03-25 DIAGNOSIS — D0512 Intraductal carcinoma in situ of left breast: Secondary | ICD-10-CM

## 2023-04-24 DIAGNOSIS — H524 Presbyopia: Secondary | ICD-10-CM | POA: Diagnosis not present

## 2023-04-24 DIAGNOSIS — E119 Type 2 diabetes mellitus without complications: Secondary | ICD-10-CM | POA: Diagnosis not present

## 2023-05-03 ENCOUNTER — Other Ambulatory Visit (HOSPITAL_COMMUNITY)
Admission: RE | Admit: 2023-05-03 | Discharge: 2023-05-03 | Disposition: A | Discharge status: Home / Self Care | Payer: Medicare Other | Source: Ambulatory Visit | Attending: "Endocrinology | Admitting: "Endocrinology

## 2023-05-03 DIAGNOSIS — D3502 Benign neoplasm of left adrenal gland: Secondary | ICD-10-CM | POA: Diagnosis not present

## 2023-05-03 DIAGNOSIS — E782 Mixed hyperlipidemia: Secondary | ICD-10-CM | POA: Diagnosis not present

## 2023-05-03 DIAGNOSIS — E890 Postprocedural hypothyroidism: Secondary | ICD-10-CM | POA: Diagnosis not present

## 2023-05-03 LAB — LIPID PANEL
Cholesterol: 176 mg/dL (ref 0–200)
HDL: 54 mg/dL (ref >40)
LDL Cholesterol: 112 mg/dL — ABNORMAL HIGH (ref 0–99)
Total CHOL/HDL Ratio: 3.3 {ratio}
Triglycerides: 50 mg/dL (ref <150)
VLDL: 10 mg/dL (ref 0–40)

## 2023-05-03 LAB — COMPREHENSIVE METABOLIC PANEL
ALT: 12 U/L (ref 0–44)
AST: 15 U/L (ref 15–41)
Albumin: 3.4 g/dL — ABNORMAL LOW (ref 3.5–5.0)
Alkaline Phosphatase: 83 U/L (ref 38–126)
Anion gap: 10 (ref 5–15)
BUN: 17 mg/dL (ref 8–23)
CO2: 27 mmol/L (ref 22–32)
Calcium: 9.2 mg/dL (ref 8.9–10.3)
Chloride: 103 mmol/L (ref 98–111)
Creatinine, Ser: 0.67 mg/dL (ref 0.44–1.00)
GFR, Estimated: >60 mL/min (ref >60)
Glucose, Bld: 107 mg/dL — ABNORMAL HIGH (ref 70–99)
Potassium: 3.3 mmol/L — ABNORMAL LOW (ref 3.5–5.1)
Sodium: 140 mmol/L (ref 135–145)
Total Bilirubin: 0.5 mg/dL (ref <1.2)
Total Protein: 7.5 g/dL (ref 6.5–8.1)

## 2023-05-03 LAB — TSH: TSH: 3.251 u[IU]/mL (ref 0.350–4.500)

## 2023-05-03 LAB — T4, FREE: Free T4: 1.23 ng/dL — ABNORMAL HIGH (ref 0.61–1.12)

## 2023-05-10 ENCOUNTER — Ambulatory Visit: Payer: Medicare Other | Admitting: "Endocrinology

## 2023-05-10 ENCOUNTER — Encounter: Payer: Self-pay | Admitting: "Endocrinology

## 2023-05-10 VITALS — BP 154/92 | HR 68 | Ht 63.0 in | Wt 242.4 lb

## 2023-05-10 DIAGNOSIS — D3502 Benign neoplasm of left adrenal gland: Secondary | ICD-10-CM

## 2023-05-10 DIAGNOSIS — E119 Type 2 diabetes mellitus without complications: Secondary | ICD-10-CM

## 2023-05-10 DIAGNOSIS — Z7984 Long term (current) use of oral hypoglycemic drugs: Secondary | ICD-10-CM

## 2023-05-10 DIAGNOSIS — I10 Essential (primary) hypertension: Secondary | ICD-10-CM

## 2023-05-10 DIAGNOSIS — E782 Mixed hyperlipidemia: Secondary | ICD-10-CM

## 2023-05-10 DIAGNOSIS — E890 Postprocedural hypothyroidism: Secondary | ICD-10-CM | POA: Diagnosis not present

## 2023-05-10 LAB — POCT GLYCOSYLATED HEMOGLOBIN (HGB A1C): HbA1c, POC (controlled diabetic range): 6.3 % (ref 0.0–7.0)

## 2023-05-10 MED ORDER — LEVOTHYROXINE SODIUM 100 MCG PO TABS: 100.0000 ug | ORAL_TABLET | Freq: Every day | ORAL | 1 refills | Status: DC

## 2023-05-10 MED ORDER — SPIRONOLACTONE 50 MG PO TABS: 50.0000 mg | ORAL_TABLET | Freq: Every day | ORAL | 1 refills | Status: DC

## 2023-05-10 MED ORDER — POTASSIUM CHLORIDE CRYS ER 20 MEQ PO TBCR: 20.0000 meq | EXTENDED_RELEASE_TABLET | Freq: Two times a day (BID) | ORAL | 1 refills | Status: DC

## 2023-05-10 MED ORDER — TIRZEPATIDE 2.5 MG/0.5ML ~~LOC~~ SOAJ: 2.5000 mg | SUBCUTANEOUS | 0 refills | Status: DC

## 2023-05-10 NOTE — Patient Instructions (Signed)
                                     Advice for Weight Management  -For most of us the best way to lose weight is by diet management. Generally speaking, diet management means consuming less calories intentionally which over time brings about progressive weight loss.  This can be achieved more effectively by avoiding ultra processed carbohydrates, processed meats, unhealthy fats.    It is critically important to know your numbers: how much calorie you are consuming and how much calorie you need. More importantly, our carbohydrates sources should be unprocessed naturally occurring  complex starch food items.  It is always important to balance nutrition also by  appropriate intake of proteins (mainly plant-based), healthy fats/oils, plenty of fruits and vegetables.   -The American College of Lifestyle Medicine (ACL M) recommends nutrition derived mostly from Whole Food, Plant Predominant Sources example an apple instead of applesauce or apple pie. Eat Plenty of vegetables, Mushrooms, fruits, Legumes, Whole Grains, Nuts, seeds in lieu of processed meats, processed snacks/pastries red meat, poultry, eggs.  Use only water or unsweetened tea for hydration.  The College also recommends the need to stay away from risky substances including alcohol, smoking; obtaining 7-9 hours of restorative sleep, at least 150 minutes of moderate intensity exercise weekly, importance of healthy social connections, and being mindful of stress and seek help when it is overwhelming.    -Sticking to a routine mealtime to eat 3 meals a day and avoiding unnecessary snacks is shown to have a big role in weight control. Under normal circumstances, the only time we burn stored energy is when we are hungry, so allow  some hunger to take place- hunger means no food between appropriate meal times, only water.  It is not advisable to starve.   -It is better to avoid simple carbohydrates including:  Cakes, Sweet Desserts, Ice Cream, Soda (diet and regular), Sweet Tea, Candies, Chips, Cookies, Store Bought Juices, Alcohol in Excess of  1-2 drinks a day, Lemonade,  Artificial Sweeteners, Doughnuts, Coffee Creamers, "Sugar-free" Products, etc, etc.  This is not a complete list.....    -Consulting with certified diabetes educators is proven to provide you with the most accurate and current information on diet.  Also, you may be  interested in discussing diet options/exchanges , we can schedule a visit with Frances Hughes, RDN, CDE for individualized nutrition education.  -Exercise: If you are able: 30 -60 minutes a day ,4 days a week, or 150 minutes of moderate intensity exercise weekly.    The longer the better if tolerated.  Combine stretch, strength, and aerobic activities.  If you were told in the past that you have high risk for cardiovascular diseases, or if you are currently symptomatic, you may seek evaluation by your heart doctor prior to initiating moderate to intense exercise programs.                                  Additional Care Considerations for Diabetes/Prediabetes   -Diabetes  is a chronic disease.  The most important care consideration is regular follow-up with your diabetes care provider with the goal being avoiding or delaying its complications and to take advantage of advances in medications and technology.  If appropriate actions are taken early enough, type 2 diabetes can even be   reversed.  Seek information from the right source.  - Whole Food, Plant Predominant Nutrition is highly recommended: Eat Plenty of vegetables, Mushrooms, fruits, Legumes, Whole Grains, Nuts, seeds in lieu of processed meats, processed snacks/pastries red meat, poultry, eggs as recommended by American College of  Lifestyle Medicine (ACLM).  -Type 2 diabetes is known to coexist with other important comorbidities such as high blood pressure and high cholesterol.  It is critical to control not only the  diabetes but also the high blood pressure and high cholesterol to minimize and delay the risk of complications including coronary artery disease, stroke, amputations, blindness, etc.  The good news is that this diet recommendation for type 2 diabetes is also very helpful for managing high cholesterol and high blood blood pressure.  - Studies showed that people with diabetes will benefit from a class of medications known as ACE inhibitors and statins.  Unless there are specific reasons not to be on these medications, the standard of care is to consider getting one from these groups of medications at an optimal doses.  These medications are generally considered safe and proven to help protect the heart and the kidneys.    - People with diabetes are encouraged to initiate and maintain regular follow-up with eye doctors, foot doctors, dentists , and if necessary heart and kidney doctors.     - It is highly recommended that people with diabetes quit smoking or stay away from smoking, and get yearly  flu vaccine and pneumonia vaccine at least every 5 years.  See above for additional recommendations on exercise, sleep, stress management , and healthy social connections.      

## 2023-05-10 NOTE — Progress Notes (Signed)
05/10/2023, 1:27 PM  Endocrinology follow-up note   Subjective:    [name], female    DOB: Jan 21, 1956, PCP Anabel Halon, MD   Past Medical History:  Diagnosis Date   Adrenal mass, left (HCC)    Breast cancer (HCC)    Degenerative disc disease at L5-S1 level    Depression 07/15/2011   DM (diabetes mellitus) (HCC)    Type II controlled   Enlarged heart    Family history of breast cancer    Family history of colon cancer    Family history of pancreatic cancer    Family history of stomach cancer    Grave's disease    History of kidney stones    HTN (hypertension)    Hyperlipidemia    Hypothyroidism    Lymphocytosis    Nephrolithiasis    OSA on CPAP    Past Surgical History:  Procedure Laterality Date   ABDOMINAL HYSTERECTOMY     BREAST BIOPSY Left 01/06/2020   BREAST LUMPECTOMY WITH RADIOACTIVE SEED LOCALIZATION Left 02/19/2020   Procedure: LEFT BREAST LUMPECTOMY WITH RADIOACTIVE SEED LOCALIZATION;  Surgeon: Abigail Miyamoto, MD;  Location: [facility];  Service: General;  Laterality: Left;   COLONOSCOPY  2011   normal colonoscopy   COLONOSCOPY WITH PROPOFOL N/A 04/16/2018   Procedure: COLONOSCOPY WITH PROPOFOL;  Surgeon: Corbin Ade, MD;  [facility];  Service: Endoscopy;  Laterality: N/A;  9:45am   COLONOSCOPY WITH PROPOFOL N/A 01/26/2022   Procedure: COLONOSCOPY WITH PROPOFOL;  Surgeon: Corbin Ade, MD;  Location: [facility];  Service: Endoscopy;  Laterality: N/A;  11:15 am   HERNIA REPAIR  2011   LITHOTRIPSY     POLYPECTOMY  04/16/2018   Procedure: POLYPECTOMY;  Surgeon: Corbin Ade, MD;  [facility];  Service: Endoscopy;;  (colon)   POLYPECTOMY  01/26/2022   Procedure: POLYPECTOMY;  Surgeon: Corbin Ade, MD;  [facility];  Service: Endoscopy;;   S/P Hysterectomy  10/1996   with bilat SOO seconardt to fibroids    thyroid ablation  1986   Social History   Socioeconomic History   Marital status: Married    Spouse name: Kameran Stringfellow   Number of children: Not on file   Years of education: 12   Highest education level: 12th grade  Occupational History   Not on file  Tobacco Use   Smoking status: Former    Current packs/day: 0.00    Average packs/day: 0.8 packs/day for 19.3 years (14.5 ttl pk-yrs)    Types: Cigarettes    Start date: 01/30/1969    Quit date: 05/23/1988    Years since quitting: 34.9    Passive exposure: Past   Smokeless tobacco: Never   Tobacco comments:    I have not smoked in over 34 years  Vaping Use   Vaping status: Never Used  Substance and Sexual Activity   Alcohol use: No   Drug use: No   Sexual activity: Yes    Partners: Male    Comment: Hysterectomy  Other Topics Concern   Not on file  Social History Narrative   Not on file   Social Drivers of Health  Financial Resource Strain: Low Risk  (01/02/2023)   Overall Financial Resource Strain (CARDIA)    Difficulty of Paying Living Expenses: Not very hard  Food Insecurity: No Food Insecurity (01/02/2023)   Hunger Vital Sign    Worried About Running Out of Food in the Last Year: Never true    Ran Out of Food in the Last Year: Never true  Transportation Needs: No Transportation Needs (01/02/2023)   PRAPARE - Administrator, Civil Service (Medical): No    Lack of Transportation (Non-Medical): No  Physical Activity: Insufficiently Active (01/02/2023)   Exercise Vital Sign    Days of Exercise per Week: 1 day    Minutes of Exercise per Session: 20 min  Stress: Stress Concern Present (01/02/2023)   [employer] of Occupational Health - Occupational Stress Questionnaire    Feeling of Stress : Rather much  Social Connections: Socially Integrated (01/02/2023)   Social Connection and Isolation Panel [NHANES]    Frequency of Communication with Friends and Family: More than three times a week    Frequency  of Social Gatherings with Friends and Family: Twice a week    Attends Religious Services: More than 4 times per year    Active Member of [employer] or Organizations: Yes    Attends Engineer, structural: More than 4 times per year    Marital Status: Married   Family History  Problem Relation Age of Onset   Colon cancer Mother        d. 39   Cancer Mother        metastisis sarcoma   Colon cancer Sister 90   Head & neck cancer Sister        roof of mouth   Colon polyps Sister    Diabetes Father    Hypertension Father    Cancer Father    Lung cancer Father        d. 62   Colon polyps Sister    Obesity Sister    Esophageal cancer Brother    Cancer Brother        cancer of eye   Aneurysm Maternal Aunt        brain   Cancer Maternal Uncle        NOS   COPD Maternal Uncle    Stomach cancer Maternal Grandfather 76   Heart attack Maternal Grandfather    Pancreatic cancer Paternal Grandmother        d. 70   Cancer Maternal Uncle        NOS - mother's pat 1/2 brother   Breast cancer Cousin        3 maternal cousins with breast cancer, some under 50, all different branches   Colon cancer Cousin        five mat first cousins, 4 were siblings   Cancer Cousin        2 mat 1/2 cousins with NOS cancer   Outpatient Encounter Medications as of 05/10/2023  Medication Sig   spironolactone (ALDACTONE) 50 MG tablet Take 1 tablet (50 mg total) by mouth daily.   tirzepatide [facility]) 2.5 MG/0.5ML Pen Inject 2.5 mg into the skin once a week.   anastrozole (ARIMIDEX) 1 MG tablet Take 1 tablet (1 mg total) by mouth daily. Start 06/08/2020   aspirin 81 MG EC tablet Take 81 mg by mouth 3 (three) times a week. Swallow whole.   atorvastatin (LIPITOR) 40 MG tablet TAKE 1 TABLET(40 MG) BY MOUTH DAILY   Blood  Glucose Monitoring Suppl (BLOOD GLUCOSE MONITOR SYSTEM) W/DEVICE KIT Please dispense based on patient and insurance preference. Use to monitor fasting blood sugar 1x daily. Dx: E11.9    cloNIDine (CATAPRES) 0.2 MG tablet Take 1 tablet (0.2 mg total) by mouth 2 (two) times daily.   furosemide (LASIX) 20 MG tablet TAKE 1 TABLET(20 MG) BY MOUTH DAILY AS NEEDED   Glucose Blood (BLOOD GLUCOSE TEST STRIPS) STRP Please dispense based on patient and insurance preference. Use to monitor fasting blood sugar 1x daily. Dx: E11.9   Lancets 28G MISC Please dispense based on patient and insurance preference. Use to monitor fasting blood sugar 1x daily. Dx: E11.9   levothyroxine (SYNTHROID) 100 MCG tablet Take 1 tablet (100 mcg total) by mouth daily before breakfast.   losartan (COZAAR) 100 MG tablet TAKE 1 TABLET(100 MG) BY MOUTH DAILY   metFORMIN (GLUCOPHAGE) 500 MG tablet Take 1 tablet (500 mg total) by mouth daily with breakfast.   Multiple Vitamin (MULTIVITAMIN WITH MINERALS) TABS tablet Take 1 tablet by mouth daily.   potassium chloride SA (KLOR-CON M) 20 MEQ tablet Take 1 tablet (20 mEq total) by mouth 2 (two) times daily.   terbinafine (LAMISIL) 250 MG tablet Take 1 tablet (250 mg total) by mouth daily.   [DISCONTINUED] amLODipine (NORVASC) 5 MG tablet Take 1 tablet (5 mg total) by mouth daily.   [DISCONTINUED] levothyroxine (SYNTHROID) 112 MCG tablet Take 1 tablet (112 mcg total) by mouth daily before breakfast.   [DISCONTINUED] potassium chloride SA (KLOR-CON M) 20 MEQ tablet TAKE 1 TABLET BY MOUTH DAILY (Patient taking differently: Take 20 mEq by mouth daily as needed (when urinating frequently).)   No facility-administered encounter medications on file as of 05/10/2023.   ALLERGIES: No Known Allergies  VACCINATION STATUS: Immunization History  Administered Date(s) Administered   Fluad Quad(high Dose 65+) 02/08/2019, 03/05/2020, 02/09/2022   Fluad Trivalent(High Dose 65+) 02/13/2023   Influenza-Unspecified 03/21/2011, 03/20/2015   Moderna Sars-Covid-2 Vaccination 07/08/2019, 07/31/2019   PNEUMOCOCCAL CONJUGATE-20 09/29/2021   Pneumococcal Polysaccharide-23 04/13/2007,  03/05/2020   Tdap 09/14/2012    HPI BETEL BERTOLA is 67 y.o. female who returns for follow-up with previsit labs.  She was initially seen in consultation for  left adrenal adenoma requested by Anabel Halon, MD. The left adrenal adenoma was found to be stable and nonfunctioning.  She has a long and complicated medical history.  See notes from previous visits.    History is obtained directly from the patient and review of her medical records.  she has history of hypertension on multiple medications.  She was supposed to be on spironolactone along with her other blood pressure medications, however for unclear reasons given to her spironolactone is not on her medication list currently.    She also has history of Graves' disease which required radioactive iodine thyroid ablation in 2006.  She is currently on levothyroxine 112 mcg p.o. daily before breakfast.  Her previsit thyroid function test are still consistent with slight over replacement.  She reports compliance and consistency.  She continues to feel better, has no new complaints today.  She would like to achieve some weight loss and asking for the GLP-1 receptor agonists. She reports more consistent energy level.  She did not try this class of medications before.  She is known to have left adrenal mass at least since September 2005.  She has a series of abdominal and thoracic imaging studies since then and it was documented that this lesion was stable and displays  features consistent with benign adenoma.  During her last abdominal CT in 2016 it measured 3.5 cm (3.2 cm in 2007) and the lesion demonstrated washout characteristics consistent with benign adrenal adenoma with absolute washout of 93%.  The right adrenal glands were normal. Over the years, she underwent various endocrine work-up for this adenoma and none of them were significant for functioning lesion. Most recently in October 2021 she underwent 24-hour urine collection for  metanephrines which were normal.  Her free plasma metanephrines were normal.   Along with her hypertension, patient has had chronic hypokalemia which required intermittent supplement with potassium.  She is currently on lower dose potassium 20 mEq daily.  Her previsit labs show hypokalemia with potassium being 3.3. She is not on regular diuresis with Lasix anymore, she uses Lasix 20 mg p.o. as needed.  She has well-controlled type 2 diabetes with point-of-care A1c today is 6.3% increasing from 6%.  She is currently on metformin 500 mg p.o. once a day, using it sparingly.    She was recently diagnosed with breast cancer s/p chemo/surgery.      She has sleep apnea on CPAP machine. She does not consume licorice, no family history of premature coronary artery disease, CVA, adrenal, thyroid, parathyroid, pituitary dysfunctions.  Review of Systems  Minimally fluctuating body weight.  Objective:       05/10/2023   11:23 AM 05/10/2023   10:43 AM 03/09/2023    1:35 PM  Vitals with BMI  Height  5\' 3"    Weight  242 lbs 6 oz   BMI  42.95   Systolic 154 130 161  Diastolic 92 92 98  Pulse  68     BP (!) 154/92 Comment: R arm with manual cuff. Dr.Corneisha Alvi made aware. Advised pt to conitnue to monitor her BP at home and contact PCP if BP continues to be elevated.  Pulse 68   Ht 5\' 3"  (1.6 m)   Wt 242 lb 6.4 oz (110 kg)   BMI 42.94 kg/m   Wt Readings from Last 3 Encounters:  05/10/23 242 lb 6.4 oz (110 kg)  03/09/23 249 lb (112.9 kg)  02/13/23 250 lb 3.2 oz (113.5 kg)    Physical Exam She has bilateral peripheral edema.  CMP ( most recent) CMP     Component Value Date/Time   NA 140 05/03/2023 1030   NA 143 10/27/2022 1043   K 3.3 (L) 05/03/2023 1030   CL 103 05/03/2023 1030   CO2 27 05/03/2023 1030   GLUCOSE 107 (H) 05/03/2023 1030   BUN 17 05/03/2023 1030   BUN 17 10/27/2022 1043   CREATININE 0.67 05/03/2023 1030   CREATININE 0.86 03/08/2022 1244   CREATININE 0.68 06/12/2020  0930   CALCIUM 9.2 05/03/2023 1030   PROT 7.5 05/03/2023 1030   PROT 6.9 10/27/2022 1043   ALBUMIN 3.4 (L) 05/03/2023 1030   ALBUMIN 3.9 10/27/2022 1043   AST 15 05/03/2023 1030   AST 12 (L) 03/08/2022 1244   ALT 12 05/03/2023 1030   ALT 9 03/08/2022 1244   ALKPHOS 83 05/03/2023 1030   BILITOT 0.5 05/03/2023 1030   BILITOT 0.3 10/27/2022 1043   BILITOT 0.4 03/08/2022 1244   GFRNONAA >60 05/03/2023 1030   GFRNONAA >60 03/08/2022 1244   GFRNONAA 94 11/07/2018 0927   GFRAA >60 02/17/2020 0930   GFRAA 109 11/07/2018 0927     Diabetic Labs (most recent): Lab Results  Component Value Date   HGBA1C 6.3 05/10/2023   HGBA1C 6.0  11/04/2022   HGBA1C 6.0 05/05/2022   MICROALBUR 5.3 09/09/2019   MICROALBUR 2.2 11/07/2018   MICROALBUR 0.3 04/03/2015     Lipid Panel ( most recent) Lipid Panel     Component Value Date/Time   CHOL 176 05/03/2023 1030   CHOL 165 10/27/2022 1043   TRIG 50 05/03/2023 1030   HDL 54 05/03/2023 1030   HDL 56 10/27/2022 1043   CHOLHDL 3.3 05/03/2023 1030   VLDL 10 05/03/2023 1030   LDLCALC 112 (H) 05/03/2023 1030   LDLCALC 96 10/27/2022 1043   LDLCALC 107 (H) 06/12/2020 0930   LDLDIRECT 110 (H) 01/21/2012 0950   LABVLDL 13 10/27/2022 1043     Recent Results (from the past 2160 hours)  Urine Microalbumin w/creat. ratio     Status: None   Collection Time: 02/13/23 11:00 AM  Result Value Ref Range   Creatinine, Urine 17.6 Not Estab. mg/dL   Microalbumin, Urine <9.5 Not Estab. ug/mL   Microalb/Creat Ratio <17 0 - 29 mg/g creat    Comment:                        Normal:                0 -  29                        Moderately increased: 30 - 300                        Severely increased:       >300   Comprehensive metabolic panel     Status: Abnormal   Collection Time: 05/03/23 10:30 AM  Result Value Ref Range   Sodium 140 135 - 145 mmol/L   Potassium 3.3 (L) 3.5 - 5.1 mmol/L   Chloride 103 98 - 111 mmol/L   CO2 27 22 - 32 mmol/L   Glucose,  Bld 107 (H) 70 - 99 mg/dL    Comment: Glucose reference range applies only to samples taken after fasting for at least 8 hours.   BUN 17 8 - 23 mg/dL   Creatinine, Ser 1.88 0.44 - 1.00 mg/dL   Calcium 9.2 8.9 - 41.6 mg/dL   Total Protein 7.5 6.5 - 8.1 g/dL   Albumin 3.4 (L) 3.5 - 5.0 g/dL   AST 15 15 - 41 U/L   ALT 12 0 - 44 U/L   Alkaline Phosphatase 83 38 - 126 U/L   Total Bilirubin 0.5 <1.2 mg/dL   GFR, Estimated >60 >63 mL/min    Comment: (NOTE) Calculated using the CKD-EPI Creatinine Equation (2021)    Anion gap 10 5 - 15    Comment: Performed at [facility], [address] [place], [place] [address]  TSH     Status: None   Collection Time: 05/03/23 10:30 AM  Result Value Ref Range   TSH 3.251 0.350 - 4.500 uIU/mL    Comment: Performed by a 3rd Generation assay with a functional sensitivity of <=0.01 uIU/mL. Performed at [facility], [address], [place], [place] [address]   T4, free     Status: Abnormal   Collection Time: 05/03/23 10:30 AM  Result Value Ref Range   Free T4 1.23 (H) 0.61 - 1.12 ng/dL    Comment: (NOTE) Biotin ingestion may interfere with free T4 tests. If the results are inconsistent with the TSH level, previous test results, or the  clinical presentation, then consider biotin interference. If needed, order repeat testing after stopping biotin. Performed at [facility], [address], [place], [place] [address]   Lipid panel     Status: Abnormal   Collection Time: 05/03/23 10:30 AM  Result Value Ref Range   Cholesterol 176 0 - 200 mg/dL   Triglycerides 50 <621 mg/dL   HDL 54 >30 mg/dL   Total CHOL/HDL Ratio 3.3 RATIO   VLDL 10 0 - 40 mg/dL   LDL Cholesterol 865 (H) 0 - 99 mg/dL    Comment:        Total Cholesterol/HDL:CHD Risk Coronary Heart Disease Risk Table                     Men   Women  1/2 Average Risk   3.4   3.3  Average Risk       5.0   4.4  2 X Average Risk   9.6   7.1  3 X Average Risk  23.4   11.0        Use the  calculated Patient Ratio above and the CHD Risk Table to determine the patient's CHD Risk.        ATP III CLASSIFICATION (LDL):  <100     mg/dL   Optimal  784-696  mg/dL   Near or Above                    Optimal  130-159  mg/dL   Borderline  295-284  mg/dL   High  >132     mg/dL   Very High Performed at [facility], [address] [place], [place] [address]   HgB A1c     Status: None   Collection Time: 05/10/23 10:54 AM  Result Value Ref Range   Hemoglobin A1C     HbA1c POC (<> result, manual entry)     HbA1c, POC (prediabetic range)     HbA1c, POC (controlled diabetic range) 6.3 0.0 - 7.0 %     Assessment & Plan:   1. Adrenal adenoma- left -Her plasma free metanephrines were normal.  Review of all of her previous work-up points to a stable nonfunctioning left adrenal adenoma.  This adenoma is not contributing to her history of hypertension.    This lesion was documented to be stable since 2005, she will not need surgical intervention at this time.     Her mild hypokalemia seems to be diuretic related, advised her to increase her potassium to 20 mEq twice a day.    She does not have paroxysmal symptoms to suggest pheochromocytoma.  Her recent work-up rules out  Cushing syndrome. Her recent work-up with aldosterone, plasma renin activity are not conclusive for primary hyperaldosteronism.  She will not need to surgical intervention for the adenoma at this time.   She will have repeat aldosterone, plasma renin activity and ratio before her next visit.   2.  Essential hypertension  She has responded when her blood pressure regimen included spironolactone.  I advised her to discontinue amlodipine and resume spironolactone 50 mg p.o. daily instead.  This is in addition to her other blood pressure medications including clonidine 0.2 mg p.o. twice daily, losartan 100 mg p.o. daily. Her blood pressure is above target at 154/92 mmHg today.  3.  RAI induced hypothyroidism  Regarding  her RAI induced hypothyroidism: Her previsit thyroid function tests are consistent with over replacement.  I discussed and lowered her  levothyroxine to 100 mcg p.o. daily before breakfast.    - We discussed about the correct intake of her thyroid hormone, on empty stomach at fasting, with water, separated by at least 30 minutes from breakfast and other medications,  and separated by more than 4 hours from calcium, iron, multivitamins, acid reflux medications (PPIs). -Patient is made aware of the fact that thyroid hormone replacement is needed for life, dose to be adjusted by periodic monitoring of thyroid function tests.    4.  Type 2 diabetes: Controlled with point-of-care A1c of 6.3% today.   she is advised to continue metformin 500 mg p.o. once daily at breakfast.   - she acknowledges that there is a room for improvement in her food and drink choices. - Suggestion is made for her to avoid simple carbohydrates  from her diet including Cakes, Sweet Desserts, Ice Cream, Soda (diet and regular), Sweet Tea, Candies, Chips, Cookies, Store Bought Juices, Alcohol , Artificial Sweeteners,  Coffee Creamer, and "Sugar-free" Products, Lemonade. This will help patient to have more stable blood glucose profile and potentially avoid unintended weight gain.  The following Lifestyle Medicine recommendations according to American College of Lifestyle Medicine  [facility]) were discussed and and offered to patient and she  agrees to start the journey:  A. Whole Foods, Plant-Based Nutrition comprising of fruits and vegetables, plant-based proteins, whole-grain carbohydrates was discussed in detail with the patient.   A list for source of those nutrients were also provided to the patient.  Patient will use only water or unsweetened tea for hydration. B.  The need to stay away from risky substances including alcohol, smoking; obtaining 7 to 9 hours of restorative sleep, at least 150 minutes of moderate intensity exercise  weekly, the importance of healthy social connections,  and stress management techniques were discussed. C.  A full color page of  Calorie density of various food groups per pound showing examples of each food groups was provided to the patient.    5) morbid obesity: This is a new and ongoing concern for her.  She is not engaged with lifestyle medicine recommendations optimally.  She is interested to explore GLP-1 receptor agonist.  I discussed and prescribed Mounjaro 2.5 mg subcutaneously weekly.  Side effects and precautions discussed with her.  This medication will be advanced as she tolerates.  6)  hyperlipidemia:  Not controlled with LDL 112.  she is advised to continue Lipitor 40 mg p.o. nightly.   - she is advised to seek and establish a primary care provider in town, Dr. Allena Katz was suggested for her primary care needs.    I spent  41  minutes in the care of the patient today including review of labs from Thyroid Function, CMP, and other relevant labs ; imaging/biopsy records (current and previous including abstractions from other facilities); face-to-face time discussing  her lab results and symptoms, medications doses, her options of short and long term treatment based on the latest standards of care / guidelines;   and documenting the encounter.  Frances Hughes  participated in the discussions, expressed understanding, and voiced agreement with the above plans.  All questions were answered to her satisfaction. she is encouraged to contact clinic should she have any questions or concerns prior to her return visit.     Follow up plan: Return in about 3 months (around 08/08/2023) for F/U with Pre-visit Labs, A1c -NV.   Marquis Lunch, MD [facility] [address]  [address] [place], [place] [address] Phone: [phone]  Fax: [phone]     05/10/2023, 1:27 PM  This note was partially dictated with voice recognition software. Similar  sounding words can be transcribed inadequately or may not  be corrected upon review.

## 2023-05-23 ENCOUNTER — Other Ambulatory Visit: Payer: Self-pay | Admitting: "Endocrinology

## 2023-06-26 ENCOUNTER — Ambulatory Visit: Payer: Self-pay | Admitting: Internal Medicine

## 2023-06-26 ENCOUNTER — Ambulatory Visit (INDEPENDENT_AMBULATORY_CARE_PROVIDER_SITE_OTHER): Payer: Medicare Other | Admitting: Physician Assistant

## 2023-06-26 VITALS — BP 174/90 | HR 72 | Temp 97.9°F | Ht 63.0 in | Wt 237.0 lb

## 2023-06-26 DIAGNOSIS — N3001 Acute cystitis with hematuria: Secondary | ICD-10-CM | POA: Diagnosis not present

## 2023-06-26 DIAGNOSIS — R300 Dysuria: Secondary | ICD-10-CM

## 2023-06-26 LAB — POCT URINALYSIS DIP (CLINITEK)
Bilirubin, UA: NEGATIVE
Glucose, UA: NEGATIVE mg/dL
Ketones, POC UA: NEGATIVE mg/dL
Nitrite, UA: NEGATIVE
POC PROTEIN,UA: 30 — AB
Spec Grav, UA: 1.02 (ref 1.010–1.025)
Urobilinogen, UA: 0.2 U/dL
pH, UA: 6 (ref 5.0–8.0)

## 2023-06-26 MED ORDER — NITROFURANTOIN MONOHYD MACRO 100 MG PO CAPS: 100.0000 mg | ORAL_CAPSULE | Freq: Two times a day (BID) | ORAL | 0 refills | Status: DC

## 2023-06-26 NOTE — Progress Notes (Cosign Needed)
Acute Office Visit  Subjective:     [name], female    DOB: 1955/06/07, 68 y.o.   MRN: [id number]   HPI Patient is in today for Urinary symptoms  She reports new onset dysuria, urinary urgency, and pelvic pressure . The current episode started yesterday and is staying constant. Patient states symptoms are 7/10 in intensity, occurring intermittently. She  has not been recently treated for similar symptoms.    Associated symptoms: Yes abdominal pain, suprapubic No back pain  No chills No constipation  No cramping No diarrhea  No discharge No fever  No hematuria No nausea  No vomiting      Review of Systems  Constitutional:  Negative for chills and fever.  Eyes:  Negative for blurred vision.  Respiratory:  Negative for cough and shortness of breath.   Cardiovascular:  Negative for chest pain and palpitations.  Gastrointestinal:  Positive for abdominal pain. Negative for nausea and vomiting.  Genitourinary:  Positive for dysuria and urgency. Negative for flank pain and hematuria.        Objective:     BP (!) 174/90   Pulse 72   Temp 97.9 F (36.6 C)   Ht 5\' 3"  (1.6 m)   Wt 237 lb (107.5 kg)   SpO2 95%   BMI 41.98 kg/m   Physical Exam Vitals reviewed.  Constitutional:      General: She is not in acute distress.    Appearance: Normal appearance. She is obese.  HENT:     Nose: Nose normal.     Mouth/Throat:     Mouth: Mucous membranes are moist.     Pharynx: Oropharynx is clear.  Eyes:     Extraocular Movements: Extraocular movements intact.     Conjunctiva/sclera: Conjunctivae normal.  Cardiovascular:     Rate and Rhythm: Normal rate and regular rhythm.     Heart sounds: No murmur heard.    No friction rub. No gallop.  Pulmonary:     Effort: Pulmonary effort is normal.     Breath sounds: Normal breath sounds. No stridor. No wheezing, rhonchi or rales.  Abdominal:     General: Abdomen is flat.     Palpations: Abdomen is soft.      Tenderness: There is abdominal tenderness in the suprapubic area. There is no right CVA tenderness or left CVA tenderness.  Musculoskeletal:        General: Normal range of motion.  Lymphadenopathy:     Cervical: No cervical adenopathy.  Skin:    General: Skin is warm and dry.     Capillary Refill: Capillary refill takes less than 2 seconds.  Neurological:     General: No focal deficit present.     Mental Status: She is alert and oriented to person, place, and time.  Psychiatric:        Mood and Affect: Mood normal.        Behavior: Behavior normal.     Results for orders placed or performed in visit on 06/26/23  POCT URINALYSIS DIP (CLINITEK)  Result Value Ref Range   Color, UA yellow yellow   Clarity, UA clear clear   Glucose, UA negative negative mg/dL   Bilirubin, UA negative negative   Ketones, POC UA negative negative mg/dL   Spec Grav, UA 2.952 8.413 - 1.025   Blood, UA large (A) negative   pH, UA 6.0 5.0 - 8.0   POC PROTEIN,UA =30 (A) negative, trace   Urobilinogen, UA  0.2 0.2 or 1.0 E.U./dL   Nitrite, UA Negative Negative   Leukocytes, UA Trace (A) Negative        Assessment & Plan:  Acute cystitis with hematuria -     Nitrofurantoin Monohyd Macro; Take 1 capsule (100 mg total) by mouth 2 (two) times daily.  Dispense: 10 capsule; Refill: 0  Dysuria -     POCT URINALYSIS DIP (CLINITEK) -     Urine Culture    Patient appears stable today.  Physical exam without abnormal findings.  No CVA tenderness, but she does have suprapubic tenderness.  Urine dipstick done in office today. Urine sent for culture, will review sensitivities and treat as indicated.  Due to urine dipstick results and symptoms of dysuria and urgency, patient started on Macrobid today.  Patient should return to office if she experiences nausea, vomiting, fevers, flank pain.   Blood pressure elevated on multiple readings. Patient denies headaches, chest pain, SOB, palpitations, or vision changes.  Patient encouraged to follow up with PCP for management of blood pressure.    Return if symptoms worsen or fail to improve.  Toni Amend Damione Robideau, PA-C

## 2023-06-26 NOTE — Telephone Encounter (Signed)
Copied from CRM [id number]. Topic: Clinical - Red Word Triage >> Jun 26, 2023  8:34 AM Dennison Nancy wrote: Red Word that prompted transfer to Nurse Triage: Pressure below belly with a lot of pain , urge to urinate , if go very little urine  Possible uti  Chief Complaint: Urinary pain Symptoms: pain with urinating Frequency: started yesterday Pertinent Negatives: Patient denies fever Disposition: [] ED /[] Urgent Care (no appt availability in office) / [x] Appointment(In office/virtual)/ []  [facility]/ [] Home Care/ [] Refused Recommended Disposition /[] [facility]/ []  Follow-up with PCP Additional Notes: States symptoms started yesterday.  Apt made for this afternoon.  Care advice given, instructed to go to er if becomes worse.  Denies questions.   Reason for Disposition  Side (flank) or lower back pain present  Answer Assessment - Initial Assessment Questions 1. SYMPTOM: "What's the main symptom you're concerned about?" (e.g., frequency, incontinence)     pain 2. ONSET: "When did the  pain  start?"     Below belly in pelvic are, started yesterday 3. PAIN: "Is there any pain?" If Yes, ask: "How bad is it?" (Scale: 1-10; mild, moderate, severe)     7/10 4. CAUSE: "What do you think is causing the symptoms?"     uti 5. OTHER SYMPTOMS: "Do you have any other symptoms?" (e.g., blood in urine, fever, flank pain, pain with urination)     Denies.  Protocols used: Urinary Symptoms-A-AH

## 2023-06-28 ENCOUNTER — Encounter: Payer: Self-pay | Admitting: Physician Assistant

## 2023-06-28 LAB — SPECIMEN STATUS REPORT

## 2023-06-28 LAB — URINE CULTURE

## 2023-07-11 ENCOUNTER — Other Ambulatory Visit: Payer: Self-pay | Admitting: Hematology and Oncology

## 2023-07-31 ENCOUNTER — Other Ambulatory Visit (HOSPITAL_COMMUNITY)
Admission: RE | Admit: 2023-07-31 | Discharge: 2023-07-31 | Disposition: A | Discharge status: Home / Self Care | Source: Ambulatory Visit | Attending: "Endocrinology | Admitting: "Endocrinology

## 2023-07-31 DIAGNOSIS — E119 Type 2 diabetes mellitus without complications: Secondary | ICD-10-CM | POA: Diagnosis not present

## 2023-07-31 LAB — TSH: TSH: 4.28 u[IU]/mL (ref 0.350–4.500)

## 2023-07-31 LAB — COMPREHENSIVE METABOLIC PANEL
ALT: 11 U/L (ref 0–44)
AST: 16 U/L (ref 15–41)
Albumin: 3.7 g/dL (ref 3.5–5.0)
Alkaline Phosphatase: 75 U/L (ref 38–126)
Anion gap: 10 (ref 5–15)
BUN: 18 mg/dL (ref 8–23)
CO2: 28 mmol/L (ref 22–32)
Calcium: 9.6 mg/dL (ref 8.9–10.3)
Chloride: 103 mmol/L (ref 98–111)
Creatinine, Ser: 0.81 mg/dL (ref 0.44–1.00)
GFR, Estimated: >60 mL/min (ref >60)
Glucose, Bld: 96 mg/dL (ref 70–99)
Potassium: 3.9 mmol/L (ref 3.5–5.1)
Sodium: 141 mmol/L (ref 135–145)
Total Bilirubin: 0.5 mg/dL (ref 0.0–1.2)
Total Protein: 7.8 g/dL (ref 6.5–8.1)

## 2023-07-31 LAB — T4, FREE: Free T4: 1.1 ng/dL (ref 0.61–1.12)

## 2023-08-03 ENCOUNTER — Other Ambulatory Visit: Payer: Self-pay | Admitting: Internal Medicine

## 2023-08-03 ENCOUNTER — Other Ambulatory Visit: Payer: Self-pay | Admitting: "Endocrinology

## 2023-08-03 DIAGNOSIS — R600 Localized edema: Secondary | ICD-10-CM

## 2023-08-03 DIAGNOSIS — I10 Essential (primary) hypertension: Secondary | ICD-10-CM

## 2023-08-03 DIAGNOSIS — E782 Mixed hyperlipidemia: Secondary | ICD-10-CM

## 2023-08-08 ENCOUNTER — Encounter: Payer: Self-pay | Admitting: "Endocrinology

## 2023-08-08 ENCOUNTER — Ambulatory Visit: Payer: Medicare Other | Admitting: "Endocrinology

## 2023-08-08 VITALS — BP 150/106 | HR 92 | Ht 63.0 in | Wt 232.0 lb

## 2023-08-08 DIAGNOSIS — E782 Mixed hyperlipidemia: Secondary | ICD-10-CM

## 2023-08-08 DIAGNOSIS — Z7985 Long-term (current) use of injectable non-insulin antidiabetic drugs: Secondary | ICD-10-CM | POA: Diagnosis not present

## 2023-08-08 DIAGNOSIS — E119 Type 2 diabetes mellitus without complications: Secondary | ICD-10-CM

## 2023-08-08 DIAGNOSIS — I10 Essential (primary) hypertension: Secondary | ICD-10-CM

## 2023-08-08 DIAGNOSIS — D3502 Benign neoplasm of left adrenal gland: Secondary | ICD-10-CM | POA: Diagnosis not present

## 2023-08-08 DIAGNOSIS — E890 Postprocedural hypothyroidism: Secondary | ICD-10-CM

## 2023-08-08 LAB — POCT GLYCOSYLATED HEMOGLOBIN (HGB A1C): HbA1c, POC (controlled diabetic range): 5.8 % (ref 0.0–7.0)

## 2023-08-08 MED ORDER — HYDROCHLOROTHIAZIDE 25 MG PO TABS: 25.0000 mg | ORAL_TABLET | Freq: Every day | ORAL | 1 refills | Status: DC

## 2023-08-08 NOTE — Patient Instructions (Signed)

## 2023-08-08 NOTE — Progress Notes (Signed)
 08/08/2023, 2:13 PM  Endocrinology follow-up note   Subjective:    [name], female    DOB: [birth year], PCP Anabel Halon, MD   Past Medical History:  Diagnosis Date   Adrenal mass, left (HCC)    Breast cancer (HCC)    Degenerative disc disease at L5-S1 level    Depression 07/15/2011   DM (diabetes mellitus) (HCC)    Type II controlled   Enlarged heart    Family history of breast cancer    Family history of colon cancer    Family history of pancreatic cancer    Family history of stomach cancer    Grave's disease    History of kidney stones    HTN (hypertension)    Hyperlipidemia    Hypothyroidism    Lymphocytosis    Nephrolithiasis    OSA on CPAP    Past Surgical History:  Procedure Laterality Date   ABDOMINAL HYSTERECTOMY     BREAST BIOPSY Left 01/06/2020   BREAST LUMPECTOMY WITH RADIOACTIVE SEED LOCALIZATION Left 02/19/2020   Procedure: LEFT BREAST LUMPECTOMY WITH RADIOACTIVE SEED LOCALIZATION;  Surgeon: Abigail Miyamoto, MD;  Location: [facility];  Service: General;  Laterality: Left;   COLONOSCOPY  2011   normal colonoscopy   COLONOSCOPY WITH PROPOFOL N/A 04/16/2018   Procedure: COLONOSCOPY WITH PROPOFOL;  Surgeon: Corbin Ade, MD;  [facility];  Service: Endoscopy;  Laterality: N/A;  9:45am   COLONOSCOPY WITH PROPOFOL N/A 01/26/2022   Procedure: COLONOSCOPY WITH PROPOFOL;  Surgeon: Corbin Ade, MD;  [facility];  Service: Endoscopy;  Laterality: N/A;  11:15 am   HERNIA REPAIR  2011   LITHOTRIPSY     POLYPECTOMY  04/16/2018   Procedure: POLYPECTOMY;  Surgeon: Corbin Ade, MD;  [facility];  Service: Endoscopy;;  (colon)   POLYPECTOMY  01/26/2022   Procedure: POLYPECTOMY;  Surgeon: Corbin Ade, MD;  [facility];  Service: Endoscopy;;   S/P Hysterectomy  10/1996   with bilat SOO seconardt to fibroids   thyroid  ablation  1986   Social History   Socioeconomic History   Marital status: Married    Spouse name: Daralyn Bert   Number of children: Not on file   Years of education: 12   Highest education level: Bachelor's degree (e.g., BA, AB, BS)  Occupational History   Not on file  Tobacco Use   Smoking status: Former    Current packs/day: 0.00    Average packs/day: 0.8 packs/day for 19.3 years (14.5 ttl pk-yrs)    Types: Cigarettes    Start date: 01/30/1969    Quit date: 05/23/1988    Years since quitting: 35.2    Passive exposure: Past   Smokeless tobacco: Never   Tobacco comments:    I have not smoked in over 34 years  Vaping Use   Vaping status: Never Used  Substance and Sexual Activity   Alcohol use: No   Drug use: No   Sexual activity: Yes    Partners: Male    Comment: Hysterectomy  Other Topics Concern   Not on file  Social History Narrative   Not on file  Social Drivers of Corporate investment banker Strain: Low Risk  (06/26/2023)   Overall Financial Resource Strain (CARDIA)    Difficulty of Paying Living Expenses: Not very hard  Food Insecurity: No Food Insecurity (06/26/2023)   Hunger Vital Sign    Worried About Running Out of Food in the Last Year: Never true    Ran Out of Food in the Last Year: Never true  Transportation Needs: No Transportation Needs (06/26/2023)   PRAPARE - Administrator, Civil Service (Medical): No    Lack of Transportation (Non-Medical): No  Physical Activity: Sufficiently Active (06/26/2023)   Exercise Vital Sign    Days of Exercise per Week: 3 days    Minutes of Exercise per Session: 90 min  Stress: Stress Concern Present (01/02/2023)   [employer] of Occupational Health - Occupational Stress Questionnaire    Feeling of Stress : Rather much  Social Connections: Socially Integrated (06/26/2023)   Social Connection and Isolation Panel [NHANES]    Frequency of Communication with Friends and Family: More than three times a week     Frequency of Social Gatherings with Friends and Family: More than three times a week    Attends Religious Services: More than 4 times per year    Active Member of [employer] or Organizations: Yes    Attends Engineer, structural: More than 4 times per year    Marital Status: Married   Family History  Problem Relation Age of Onset   Colon cancer Mother        d. 49   Cancer Mother        metastisis sarcoma   Colon cancer Sister 4   Head & neck cancer Sister        roof of mouth   Colon polyps Sister    Diabetes Father    Hypertension Father    Cancer Father    Lung cancer Father        d. 60   Colon polyps Sister    Obesity Sister    Esophageal cancer Brother    Cancer Brother        cancer of eye   Aneurysm Maternal Aunt        brain   Cancer Maternal Uncle        NOS   COPD Maternal Uncle    Stomach cancer Maternal Grandfather 76   Heart attack Maternal Grandfather    Pancreatic cancer Paternal Grandmother        d. 57   Cancer Maternal Uncle        NOS - mother's pat 1/2 brother   Breast cancer Cousin        3 maternal cousins with breast cancer, some under 50, all different branches   Colon cancer Cousin        five mat first cousins, 4 were siblings   Cancer Cousin        2 mat 1/2 cousins with NOS cancer   Outpatient Encounter Medications as of 08/08/2023  Medication Sig   hydrochlorothiazide (HYDRODIURIL) 25 MG tablet Take 1 tablet (25 mg total) by mouth daily with breakfast.   anastrozole (ARIMIDEX) 1 MG tablet TAKE 1 TABLET(1 MG) BY MOUTH DAILY. START 06/08/2020   aspirin 81 MG EC tablet Take 81 mg by mouth 3 (three) times a week. Swallow whole.   atorvastatin (LIPITOR) 40 MG tablet TAKE 1 TABLET(40 MG) BY MOUTH DAILY   Blood Glucose Monitoring Suppl (BLOOD GLUCOSE MONITOR SYSTEM)  W/DEVICE KIT Please dispense based on patient and insurance preference. Use to monitor fasting blood sugar 1x daily. Dx: E11.9   cloNIDine (CATAPRES) 0.2 MG tablet Take 1 tablet  (0.2 mg total) by mouth 2 (two) times daily.   furosemide (LASIX) 20 MG tablet TAKE 1 TABLET(20 MG) BY MOUTH DAILY AS NEEDED   Glucose Blood (BLOOD GLUCOSE TEST STRIPS) STRP Please dispense based on patient and insurance preference. Use to monitor fasting blood sugar 1x daily. Dx: E11.9   Lancets 28G MISC Please dispense based on patient and insurance preference. Use to monitor fasting blood sugar 1x daily. Dx: E11.9   levothyroxine (SYNTHROID) 100 MCG tablet Take 1 tablet (100 mcg total) by mouth daily before breakfast.   losartan (COZAAR) 100 MG tablet TAKE 1 TABLET(100 MG) BY MOUTH DAILY   metFORMIN (GLUCOPHAGE) 500 MG tablet Take 1 tablet (500 mg total) by mouth daily with breakfast.   Multiple Vitamin (MULTIVITAMIN WITH MINERALS) TABS tablet Take 1 tablet by mouth daily.   nitrofurantoin, macrocrystal-monohydrate, (MACROBID) 100 MG capsule Take 1 capsule (100 mg total) by mouth 2 (two) times daily.   potassium chloride SA (KLOR-CON M) 20 MEQ tablet Take 1 tablet (20 mEq total) by mouth 2 (two) times daily.   spironolactone (ALDACTONE) 50 MG tablet Take 1 tablet (50 mg total) by mouth daily.   terbinafine (LAMISIL) 250 MG tablet Take 1 tablet (250 mg total) by mouth daily.   tirzepatide [facility]) 2.5 MG/0.5ML Pen Inject 2.5 mg into the skin once a week.   No facility-administered encounter medications on file as of 08/08/2023.   ALLERGIES: No Known Allergies  VACCINATION STATUS: Immunization History  Administered Date(s) Administered   Fluad Quad(high Dose 65+) 02/08/2019, 03/05/2020, 02/09/2022   Fluad Trivalent(High Dose 65+) 02/13/2023   Influenza-Unspecified 03/21/2011, 03/20/2015   Moderna Sars-Covid-2 Vaccination 07/08/2019, 07/31/2019   PNEUMOCOCCAL CONJUGATE-20 09/29/2021   Pneumococcal Polysaccharide-23 04/13/2007, 03/05/2020   Tdap 09/14/2012    HPI JOSELINE MCCAMPBELL is 68 y.o. female who returns for follow-up with previsit labs.  She was initially seen in consultation  for  left adrenal adenoma requested by Anabel Halon, MD. The left adrenal adenoma was found to be stable and nonfunctioning.  She has a long and complicated medical history.  See notes from previous visits.    History is obtained directly from the patient and review of her medical records.  she has history of hypertension on multiple medications.  She is currently on clonidine 0.2 milligram p.o. twice daily, losartan 100 mg p.o. daily, spironolactone 50 mg p.o. daily, and Lasix as needed. Her blood pressure is not controlled to target. Her previous workup with aldosterone, plasma renin activity were not conclusive for primary hyperaldosteronism.    She also has history of Graves' disease which required radioactive iodine thyroid ablation in 2006.  She is currently on levothyroxine 100 mcg p.o. daily before breakfast.  Her previsit thyroid function tests are consistent with appropriate replacement.    she reports compliance and consistency.  She continues to feel better, has no new complaints today.  She has achieved some weight loss, tolerating Mounjaro 2.5 mg subcutaneously weekly.   She is known to have left adrenal mass at least since September 2005.  She has a series of abdominal and thoracic imaging studies since then and it was documented that this lesion was stable and displays features consistent with benign adenoma.  During her last abdominal CT in 2016 it measured 3.5 cm (3.2 cm in 2007) and the  lesion demonstrated washout characteristics consistent with benign adrenal adenoma with absolute washout of 93%.  The right adrenal glands were normal. Over the years, she underwent various endocrine work-up for this adenoma and none of them were significant for functioning lesion. Most recently in October 2021 she underwent 24-hour urine collection for metanephrines which were normal.  Her free plasma metanephrines were normal.   Along with her hypertension, patient has had chronic hypokalemia  which required intermittent supplement with potassium.  She is currently on lower dose potassium 20 mEq daily.  Her previsit labs show corrected potassium at 3.9.   She remains on spironolactone. She is not on regular diuresis with Lasix anymore, she uses Lasix 20 mg p.o. as needed.  She has well-controlled type 2 diabetes with point-of-care A1c today is 5.8%.   She is currently on metformin 500 mg p.o. once a day, using it sparingly, in addition to her Mounjaro.  She was recently diagnosed with breast cancer s/p chemo/surgery.      She has sleep apnea on CPAP machine. She does not consume licorice, no family history of premature coronary artery disease, CVA, adrenal, thyroid, parathyroid, pituitary dysfunctions.  Review of Systems  Minimally fluctuating body weight.  Objective:       08/08/2023   11:22 AM 08/08/2023   10:59 AM 06/26/2023    1:28 PM  Vitals with BMI  Height  5\' 3"    Weight  232 lbs   BMI  41.11   Systolic 150 162 696  Diastolic 106 106 90  Pulse  92     BP (!) 150/106 Comment: L arm with manual cuff. Dr.Hiliana Eilts made aware and made changes to BP medication. Pt advised to continue to monitor BP at home and contact office if BP continues to be persistantly elevated.  Pulse 92   Ht 5\' 3"  (1.6 m)   Wt 232 lb (105.2 kg)   BMI 41.10 kg/m   Wt Readings from Last 3 Encounters:  08/08/23 232 lb (105.2 kg)  06/26/23 237 lb (107.5 kg)  05/10/23 242 lb 6.4 oz (110 kg)    Physical Exam She has bilateral peripheral edema.  CMP ( most recent) CMP     Component Value Date/Time   NA 141 07/31/2023 1143   NA 143 10/27/2022 1043   K 3.9 07/31/2023 1143   CL 103 07/31/2023 1143   CO2 28 07/31/2023 1143   GLUCOSE 96 07/31/2023 1143   BUN 18 07/31/2023 1143   BUN 17 10/27/2022 1043   CREATININE 0.81 07/31/2023 1143   CREATININE 0.86 03/08/2022 1244   CREATININE 0.68 06/12/2020 0930   CALCIUM 9.6 07/31/2023 1143   PROT 7.8 07/31/2023 1143   PROT 6.9 10/27/2022 1043    ALBUMIN 3.7 07/31/2023 1143   ALBUMIN 3.9 10/27/2022 1043   AST 16 07/31/2023 1143   AST 12 (L) 03/08/2022 1244   ALT 11 07/31/2023 1143   ALT 9 03/08/2022 1244   ALKPHOS 75 07/31/2023 1143   BILITOT 0.5 07/31/2023 1143   BILITOT 0.3 10/27/2022 1043   BILITOT 0.4 03/08/2022 1244   GFRNONAA >60 07/31/2023 1143   GFRNONAA >60 03/08/2022 1244   GFRNONAA 94 11/07/2018 0927   GFRAA >60 02/17/2020 0930   GFRAA 109 11/07/2018 0927     Diabetic Labs (most recent): Lab Results  Component Value Date   HGBA1C 5.8 08/08/2023   HGBA1C 6.3 05/10/2023   HGBA1C 6.0 11/04/2022   MICROALBUR 5.3 09/09/2019   MICROALBUR 2.2 11/07/2018   MICROALBUR 0.3  04/03/2015     Lipid Panel ( most recent) Lipid Panel     Component Value Date/Time   CHOL 176 05/03/2023 1030   CHOL 165 10/27/2022 1043   TRIG 50 05/03/2023 1030   HDL 54 05/03/2023 1030   HDL 56 10/27/2022 1043   CHOLHDL 3.3 05/03/2023 1030   VLDL 10 05/03/2023 1030   LDLCALC 112 (H) 05/03/2023 1030   LDLCALC 96 10/27/2022 1043   LDLCALC 107 (H) 06/12/2020 0930   LDLDIRECT 110 (H) 01/21/2012 0950   LABVLDL 13 10/27/2022 1043     Recent Results (from the past 2160 hours)  Urine Culture     Status: None   Collection Time: 06/26/23 12:00 AM   Specimen: Urine   Urine  Result Value Ref Range   Urine Culture, Routine Final report    Organism ID, Bacteria Comment     Comment: Mixed urogenital flora Less than 10,000 colonies/mL   Specimen status report     Status: None   Collection Time: 06/26/23 12:00 AM  Result Value Ref Range   specimen status report Comment     Comment: Please note Please note The date and/or time of collection was not indicated on the requisition as required by state and federal law.  The date of receipt of the specimen was used as the collection date if not supplied.   POCT URINALYSIS DIP (CLINITEK)     Status: Abnormal   Collection Time: 06/26/23  1:24 PM  Result Value Ref Range   Color, UA yellow  yellow   Clarity, UA clear clear   Glucose, UA negative negative mg/dL   Bilirubin, UA negative negative   Ketones, POC UA negative negative mg/dL   Spec Grav, UA 1.478 2.956 - 1.025   Blood, UA large (A) negative   pH, UA 6.0 5.0 - 8.0   POC PROTEIN,UA =30 (A) negative, trace   Urobilinogen, UA 0.2 0.2 or 1.0 E.U./dL   Nitrite, UA Negative Negative   Leukocytes, UA Trace (A) Negative  Comprehensive metabolic panel     Status: None   Collection Time: 07/31/23 11:43 AM  Result Value Ref Range   Sodium 141 135 - 145 mmol/L   Potassium 3.9 3.5 - 5.1 mmol/L   Chloride 103 98 - 111 mmol/L   CO2 28 22 - 32 mmol/L   Glucose, Bld 96 70 - 99 mg/dL    Comment: Glucose reference range applies only to samples taken after fasting for at least 8 hours.   BUN 18 8 - 23 mg/dL   Creatinine, Ser 2.13 0.44 - 1.00 mg/dL   Calcium 9.6 8.9 - 08.6 mg/dL   Total Protein 7.8 6.5 - 8.1 g/dL   Albumin 3.7 3.5 - 5.0 g/dL   AST 16 15 - 41 U/L   ALT 11 0 - 44 U/L   Alkaline Phosphatase 75 38 - 126 U/L   Total Bilirubin 0.5 0.0 - 1.2 mg/dL   GFR, Estimated >57 >84 mL/min    Comment: (NOTE) Calculated using the CKD-EPI Creatinine Equation (2021)    Anion gap 10 5 - 15    Comment: Performed at [facility], [address] [place], [place] [address]  TSH     Status: None   Collection Time: 07/31/23 11:43 AM  Result Value Ref Range   TSH 4.280 0.350 - 4.500 uIU/mL    Comment: Performed by a 3rd Generation assay with a functional sensitivity of <=0.01 uIU/mL. Performed at [facility], [address],  [place], [place] [address]   T4, free     Status: None   Collection Time: 07/31/23 11:43 AM  Result Value Ref Range   Free T4 1.10 0.61 - 1.12 ng/dL    Comment: (NOTE) Biotin ingestion may interfere with free T4 tests. If the results are inconsistent with the TSH level, previous test results, or the clinical presentation, then consider biotin interference. If needed, order repeat testing after stopping  biotin. Performed at [facility], [address], [place], [place] [address]   HgB A1c     Status: None   Collection Time: 08/08/23 11:56 AM  Result Value Ref Range   Hemoglobin A1C     HbA1c POC (<> result, manual entry)     HbA1c, POC (prediabetic range)     HbA1c, POC (controlled diabetic range) 5.8 0.0 - 7.0 %     Assessment & Plan:   1. Adrenal adenoma- left -Her plasma free metanephrines were normal.  Review of all of her previous work-up points to a stable nonfunctioning left adrenal adenoma.  This adenoma is not contributing to her history of hypertension.    This lesion was documented to be stable since 2005. Now that her hypokalemia is corrected, after next visit, she will be taken off of spironolactone to do aldosterone, PRA measurements to rule out hyperaldosteronism.  she does not have paroxysmal symptoms to suggest pheochromocytoma.  Her recent work-up rules out  Cushing syndrome. Her recent work-up with aldosterone, plasma renin activity are not conclusive for primary hyperaldosteronism.  She will not need to surgical intervention for the adenoma at this time.   She will have repeat aldosterone, plasma renin activity and ratio before her next visit.   2.  Essential hypertension -After better blood pressure control, she will have repeat aldosterone, PRA measurements after stopping spironolactone.  For now, she is advised to continue spironolactone 50 mg p.o. daily , clonidine 0.2 mg p.o. twice daily, losartan 100 mg p.o. daily, discussed and added hydrochlorothiazide 25 mg p.o. daily at breakfast.  Reportedly, she was taking clonidine only once, advised to take it twice a day.  3.  RAI induced hypothyroidism  Regarding her RAI induced hypothyroidism: Her previsit thyroid function tests are consistent with appropriate replacement.  She is advised to continue levothyroxine 100 mcg p.o. daily before breakfast     - We discussed about the correct intake of her  thyroid hormone, on empty stomach at fasting, with water, separated by at least 30 minutes from breakfast and other medications,  and separated by more than 4 hours from calcium, iron, multivitamins, acid reflux medications (PPIs). -Patient is made aware of the fact that thyroid hormone replacement is needed for life, dose to be adjusted by periodic monitoring of thyroid function tests.    4.  Type 2 diabetes: Controlled with point-of-care A1c of 5.8% improving from 6.3% during her last visit.   -She is responding to her Mounjaro, discussed and increased to 5 mg subcutaneously weekly.  Side effects and precautions discussed with her.  This is in addition to her metformin 500 mg p.o. once a day.   - she acknowledges that there is a room for improvement in her food and drink choices. - Suggestion is made for her to avoid simple carbohydrates  from her diet including Cakes, Sweet Desserts, Ice Cream, Soda (diet and regular), Sweet Tea, Candies, Chips, Cookies, Store Bought Juices, Alcohol , Artificial Sweeteners,  Coffee Creamer, and "Sugar-free" Products, Lemonade. This will help patient to  have more stable blood glucose profile and potentially avoid unintended weight gain.  The following Lifestyle Medicine recommendations according to American College of Lifestyle Medicine  [facility]) were discussed and and offered to patient and she  agrees to start the journey:  A. Whole Foods, Plant-Based Nutrition comprising of fruits and vegetables, plant-based proteins, whole-grain carbohydrates was discussed in detail with the patient.   A list for source of those nutrients were also provided to the patient.  Patient will use only water or unsweetened tea for hydration. B.  The need to stay away from risky substances including alcohol, smoking; obtaining 7 to 9 hours of restorative sleep, at least 150 minutes of moderate intensity exercise weekly, the importance of healthy social connections,  and stress management  techniques were discussed. C.  A full color page of  Calorie density of various food groups per pound showing examples of each food groups was provided to the patient.   5) morbid obesity: Her engagement with lifestyle medicine is suboptimal.  She is responding.  Darlina Rumpf above.     6)  hyperlipidemia:  Not controlled with LDL 112.  She is advised to continue Lipitor 40 mg p.o. nightly.  Side effects and precautions discussed with her.   - she is advised to seek and establish a primary care provider in town, Dr. Allena Katz was suggested for her primary care needs.    I spent  42  minutes in the care of the patient today including review of labs from Thyroid Function, CMP, and other relevant labs ; imaging/biopsy records (current and previous including abstractions from other facilities); face-to-face time discussing  her lab results and symptoms, medications doses, her options of short and long term treatment based on the latest standards of care / guidelines;   and documenting the encounter.  Frances Hughes  participated in the discussions, expressed understanding, and voiced agreement with the above plans.  All questions were answered to her satisfaction. she is encouraged to contact clinic should she have any questions or concerns prior to her return visit.    Follow up plan: Return in about 4 months (around 12/08/2023) for A1c -NV.   Marquis Lunch, MD [facility] [address] [place], [place] [address] Phone: [phone]  Fax: [phone]     08/08/2023, 2:13 PM  This note was partially dictated with voice recognition software. Similar sounding words can be transcribed inadequately or may not  be corrected upon review.

## 2023-08-09 LAB — ALDOSTERONE + RENIN ACTIVITY W/ RATIO
ALDO / PRA Ratio: 60.9 — ABNORMAL HIGH (ref 0.0–30.0)
Aldosterone: 10.6 ng/dL (ref 0.0–30.0)
PRA LC/MS/MS: 0.174 ng/mL/h (ref 0.167–5.380)

## 2023-08-14 ENCOUNTER — Encounter: Payer: Self-pay | Admitting: Internal Medicine

## 2023-08-14 ENCOUNTER — Telehealth: Payer: Self-pay | Admitting: "Endocrinology

## 2023-08-14 ENCOUNTER — Other Ambulatory Visit: Payer: Self-pay

## 2023-08-14 ENCOUNTER — Ambulatory Visit (INDEPENDENT_AMBULATORY_CARE_PROVIDER_SITE_OTHER): Payer: Medicare Other | Admitting: Internal Medicine

## 2023-08-14 VITALS — BP 123/82 | HR 72 | Ht 63.0 in | Wt 226.4 lb

## 2023-08-14 DIAGNOSIS — I10 Essential (primary) hypertension: Secondary | ICD-10-CM

## 2023-08-14 DIAGNOSIS — E119 Type 2 diabetes mellitus without complications: Secondary | ICD-10-CM

## 2023-08-14 DIAGNOSIS — E1169 Type 2 diabetes mellitus with other specified complication: Secondary | ICD-10-CM

## 2023-08-14 DIAGNOSIS — E782 Mixed hyperlipidemia: Secondary | ICD-10-CM

## 2023-08-14 DIAGNOSIS — G4733 Obstructive sleep apnea (adult) (pediatric): Secondary | ICD-10-CM | POA: Diagnosis not present

## 2023-08-14 DIAGNOSIS — E890 Postprocedural hypothyroidism: Secondary | ICD-10-CM

## 2023-08-14 DIAGNOSIS — Z7984 Long term (current) use of oral hypoglycemic drugs: Secondary | ICD-10-CM | POA: Diagnosis not present

## 2023-08-14 DIAGNOSIS — D0512 Intraductal carcinoma in situ of left breast: Secondary | ICD-10-CM

## 2023-08-14 MED ORDER — MOUNJARO 5 MG/0.5ML ~~LOC~~ SOAJ: 5.0000 mg | SUBCUTANEOUS | 0 refills | Status: DC

## 2023-08-14 NOTE — Assessment & Plan Note (Addendum)
 BP Readings from Last 1 Encounters:  08/14/23 123/82   Well-controlled with losartan 100 mg QD, hydrochlorothiazide 25 mg once daily, Spironolactone 50 mg once daily, and clonidine 0.2 mg twice daily Decrease dose of Clonidine to 0.2 mg qHS for now due to drowsiness with the morning dose Counseled for compliance with the medications Advised DASH diet and moderate exercise/walking, at least 150 mins/week

## 2023-08-14 NOTE — Telephone Encounter (Signed)
 Pt states she was supposed to have new prescription for [facility] sent to [facility] on [address]

## 2023-08-14 NOTE — Progress Notes (Unsigned)
 Established Patient Office Visit  Subjective:  [name], female    DOB: 08-17-1955  Age: 68 y.o. MRN: [id number]  CC:  Chief Complaint  Patient presents with   Care Management    6 month f/u, reports clonidine has been making her drowsy the next day.    HPI Frances Hughes is a 68 y.o. female with past medical history of type II DM, HTN, HLD, peripheral neuropathy, adrenal adenoma, breast cancer, hypothyroidism and morbid obesity who presents for f/u of her chronic medical conditions.  HTN: Her BP is well controlled now.  She has been taking losartan 100 mg QD, and clonidine 0.2 mg BID currently. She has started taking spironolactone 50 mg QD now. She denies any headache, chest pain, dyspnea or palpitations.  She tried taking clonidine only at nighttime, but her BP was elevated and was advised to take it BID by Dr. Fransico Him.  She has been feeling dizzy with the morning dose of clonidine and has to avoid going outside due to it.  She has had intermittent leg swelling in the past. She denies any orthopnea or PND currently.  Type II DM: She takes metformin 500 mg once daily and Mounjaro 5 mg QW. Her HbA1c was 5.8 in 03/25.  Denies polyuria or polyphagia currently.  Followed by Dr. Fransico Him.  Hypothyroidism: Her dose of levothyroxine was recently decreased to 100 mcg due to borderline elevated free T4.  Denies any recent change in weight or appetite.  Denies any tremors or palpitations.  She has been trying to walk/exercise regularly, but has been difficult due to her leg pain from neuropathy.  She has lost 24 lbs since the last visit.   Past Medical History:  Diagnosis Date   Adrenal mass, left (HCC)    Breast cancer (HCC)    Degenerative disc disease at L5-S1 level    Depression 07/15/2011   DM (diabetes mellitus) (HCC)    Type II controlled   Enlarged heart    Family history of breast cancer    Family history of colon cancer    Family history of pancreatic cancer     Family history of stomach cancer    Grave's disease    History of kidney stones    HTN (hypertension)    Hyperlipidemia    Hypothyroidism    Lymphocytosis    Nephrolithiasis    OSA on CPAP     Past Surgical History:  Procedure Laterality Date   ABDOMINAL HYSTERECTOMY     BREAST BIOPSY Left 01/06/2020   BREAST LUMPECTOMY WITH RADIOACTIVE SEED LOCALIZATION Left 02/19/2020   Procedure: LEFT BREAST LUMPECTOMY WITH RADIOACTIVE SEED LOCALIZATION;  Surgeon: Abigail Miyamoto, MD;  Location: [facility];  Service: General;  Laterality: Left;   COLONOSCOPY  2011   normal colonoscopy   COLONOSCOPY WITH PROPOFOL N/A 04/16/2018   Procedure: COLONOSCOPY WITH PROPOFOL;  Surgeon: Corbin Ade, MD;  Location: [facility];  Service: Endoscopy;  Laterality: N/A;  9:45am   COLONOSCOPY WITH PROPOFOL N/A 01/26/2022   Procedure: COLONOSCOPY WITH PROPOFOL;  Surgeon: Corbin Ade, MD;  Location: [facility];  Service: Endoscopy;  Laterality: N/A;  11:15 am   HERNIA REPAIR  2011   LITHOTRIPSY     POLYPECTOMY  04/16/2018   Procedure: POLYPECTOMY;  Surgeon: Corbin Ade, MD;  Location: [facility];  Service: Endoscopy;;  (colon)   POLYPECTOMY  01/26/2022   Procedure: POLYPECTOMY;  Surgeon: Corbin Ade, MD;  Location: [facility];  Service: Endoscopy;;   S/P Hysterectomy  10/1996   with bilat SOO seconardt to fibroids   thyroid ablation  1986    Family History  Problem Relation Age of Onset   Colon cancer Mother        d. 30   Cancer Mother        metastisis sarcoma   Colon cancer Sister 72   Head & neck cancer Sister        roof of mouth   Colon polyps Sister    Diabetes Father    Hypertension Father    Cancer Father    Lung cancer Father        d. 81   Colon polyps Sister    Obesity Sister    Esophageal cancer Brother    Cancer Brother        cancer of eye   Aneurysm Maternal Aunt        brain   Cancer Maternal Uncle        NOS   COPD Maternal Uncle     Stomach cancer Maternal Grandfather 76   Heart attack Maternal Grandfather    Pancreatic cancer Paternal Grandmother        d. 95   Cancer Maternal Uncle        NOS - mother's pat 1/2 brother   Breast cancer Cousin        3 maternal cousins with breast cancer, some under 50, all different branches   Colon cancer Cousin        five mat first cousins, 4 were siblings   Cancer Cousin        2 mat 1/2 cousins with NOS cancer    Social History   Socioeconomic History   Marital status: Married    Spouse name: Archita Lomeli   Number of children: Not on file   Years of education: 12   Highest education level: Bachelor's degree (e.g., BA, AB, BS)  Occupational History   Not on file  Tobacco Use   Smoking status: Former    Current packs/day: 0.00    Average packs/day: 0.8 packs/day for 19.3 years (14.5 ttl pk-yrs)    Types: Cigarettes    Start date: 01/30/1969    Quit date: 05/23/1988    Years since quitting: 35.2    Passive exposure: Past   Smokeless tobacco: Never   Tobacco comments:    I have not smoked in over 34 years  Vaping Use   Vaping status: Never Used  Substance and Sexual Activity   Alcohol use: No   Drug use: No   Sexual activity: Yes    Partners: Male    Comment: Hysterectomy  Other Topics Concern   Not on file  Social History Narrative   Not on file   Social Drivers of Health   Financial Resource Strain: Low Risk  (06/26/2023)   Overall Financial Resource Strain (CARDIA)    Difficulty of Paying Living Expenses: Not very hard  Food Insecurity: No Food Insecurity (06/26/2023)   Hunger Vital Sign    Worried About Running Out of Food in the Last Year: Never true    Ran Out of Food in the Last Year: Never true  Transportation Needs: No Transportation Needs (06/26/2023)   PRAPARE - Administrator, Civil Service (Medical): No    Lack of Transportation (Non-Medical): No  Physical Activity: Sufficiently Active (06/26/2023)   Exercise Vital Sign    Days  of Exercise per Week: 3 days    Minutes of Exercise per Session: 90 min  Stress: Stress Concern Present (01/02/2023)   [employer] of Occupational Health - Occupational Stress Questionnaire    Feeling of Stress : Rather much  Social Connections: Socially Integrated (06/26/2023)   Social Connection and Isolation Panel [NHANES]    Frequency of Communication with Friends and Family: More than three times a week    Frequency of Social Gatherings with Friends and Family: More than three times a week    Attends Religious Services: More than 4 times per year    Active Member of [employer] or Organizations: Yes    Attends Engineer, structural: More than 4 times per year    Marital Status: Married  Catering manager Violence: Not At Risk (01/04/2023)   Humiliation, Afraid, Rape, and Kick questionnaire    Fear of Current or Ex-Partner: No    Emotionally Abused: No    Physically Abused: No    Sexually Abused: No    Outpatient Medications Prior to Visit  Medication Sig Dispense Refill   anastrozole (ARIMIDEX) 1 MG tablet TAKE 1 TABLET(1 MG) BY MOUTH DAILY. START 06/08/2020 90 tablet 4   aspirin 81 MG EC tablet Take 81 mg by mouth 3 (three) times a week. Swallow whole.     atorvastatin (LIPITOR) 40 MG tablet TAKE 1 TABLET(40 MG) BY MOUTH DAILY 90 tablet 1   Blood Glucose Monitoring Suppl (BLOOD GLUCOSE MONITOR SYSTEM) W/DEVICE KIT Please dispense based on patient and insurance preference. Use to monitor fasting blood sugar 1x daily. Dx: E11.9 1 each 0   cloNIDine (CATAPRES) 0.2 MG tablet Take 1 tablet (0.2 mg total) by mouth 2 (two) times daily. 180 tablet 1   furosemide (LASIX) 20 MG tablet TAKE 1 TABLET(20 MG) BY MOUTH DAILY AS NEEDED 30 tablet 1   Glucose Blood (BLOOD GLUCOSE TEST STRIPS) STRP Please dispense based on patient and insurance preference. Use to monitor fasting blood sugar 1x daily. Dx: E11.9 100 each 0   hydrochlorothiazide (HYDRODIURIL) 25 MG tablet Take 1 tablet (25 mg total)  by mouth daily with breakfast. 90 tablet 1   Lancets 28G MISC Please dispense based on patient and insurance preference. Use to monitor fasting blood sugar 1x daily. Dx: E11.9 100 each 0   levothyroxine (SYNTHROID) 100 MCG tablet Take 1 tablet (100 mcg total) by mouth daily before breakfast. 90 tablet 1   losartan (COZAAR) 100 MG tablet TAKE 1 TABLET(100 MG) BY MOUTH DAILY 90 tablet 1   metFORMIN (GLUCOPHAGE) 500 MG tablet Take 1 tablet (500 mg total) by mouth daily with breakfast. 90 tablet 1   Multiple Vitamin (MULTIVITAMIN WITH MINERALS) TABS tablet Take 1 tablet by mouth daily.     potassium chloride SA (KLOR-CON M) 20 MEQ tablet Take 1 tablet (20 mEq total) by mouth 2 (two) times daily. 180 tablet 1   spironolactone (ALDACTONE) 50 MG tablet Take 1 tablet (50 mg total) by mouth daily. 90 tablet 1   nitrofurantoin, macrocrystal-monohydrate, (MACROBID) 100 MG capsule Take 1 capsule (100 mg total) by mouth 2 (two) times daily. 10 capsule 0   terbinafine (LAMISIL) 250 MG tablet Take 1 tablet (250 mg total) by mouth daily. 84 tablet 0   tirzepatide (MOUNJARO) 2.5 MG/0.5ML Pen Inject 2.5 mg into the skin once a week. 6 mL 0   No facility-administered medications prior to visit.    No Known Allergies  ROS Review of Systems  Constitutional:  Positive  for fatigue. Negative for chills and fever.  HENT:  Negative for congestion, sinus pressure, sinus pain and sore throat.   Eyes:  Negative for pain and discharge.  Respiratory:  Negative for cough and shortness of breath.   Cardiovascular:  Negative for chest pain and palpitations.  Gastrointestinal:  Negative for abdominal pain, diarrhea, nausea and vomiting.  Endocrine: Negative for polydipsia and polyuria.  Genitourinary:  Negative for dysuria and hematuria.  Musculoskeletal:  Negative for neck pain and neck stiffness.  Skin:  Negative for rash.  Neurological:  Positive for dizziness. Negative for weakness.  Psychiatric/Behavioral:  Negative  for agitation and behavioral problems.       Objective:    Physical Exam Vitals reviewed.  Constitutional:      General: She is not in acute distress.    Appearance: She is obese. She is not diaphoretic.  HENT:     Head: Normocephalic and atraumatic.     Nose: Nose normal.     Mouth/Throat:     Mouth: Mucous membranes are moist.  Eyes:     General: No scleral icterus.    Extraocular Movements: Extraocular movements intact.  Cardiovascular:     Rate and Rhythm: Normal rate and regular rhythm.     Heart sounds: Normal heart sounds. No murmur heard. Pulmonary:     Breath sounds: Normal breath sounds. No wheezing or rales.  Abdominal:     Palpations: Abdomen is soft.     Tenderness: There is no abdominal tenderness.  Musculoskeletal:     Cervical back: Neck supple. No tenderness.     Right lower leg: No edema.     Left lower leg: No edema.  Skin:    General: Skin is warm.     Findings: No rash.  Neurological:     General: No focal deficit present.     Mental Status: She is alert and oriented to person, place, and time.     Sensory: No sensory deficit.     Motor: No weakness.  Psychiatric:        Mood and Affect: Mood normal.        Behavior: Behavior normal.     BP 123/82   Pulse 72   Ht 5\' 3"  (1.6 m)   Wt 226 lb 6.4 oz (102.7 kg)   SpO2 94%   BMI 40.10 kg/m  Wt Readings from Last 3 Encounters:  08/14/23 226 lb 6.4 oz (102.7 kg)  08/08/23 232 lb (105.2 kg)  06/26/23 237 lb (107.5 kg)    Lab Results  Component Value Date   TSH 4.280 07/31/2023   Lab Results  Component Value Date   WBC 8.2 03/08/2022   HGB 12.0 03/08/2022   HCT 37.6 03/08/2022   MCV 83.4 03/08/2022   PLT 223 03/08/2022   Lab Results  Component Value Date   NA 141 07/31/2023   K 3.9 07/31/2023   CO2 28 07/31/2023   GLUCOSE 96 07/31/2023   BUN 18 07/31/2023   CREATININE 0.81 07/31/2023   BILITOT 0.5 07/31/2023   ALKPHOS 75 07/31/2023   AST 16 07/31/2023   ALT 11 07/31/2023    PROT 7.8 07/31/2023   ALBUMIN 3.7 07/31/2023   CALCIUM 9.6 07/31/2023   ANIONGAP 10 07/31/2023   EGFR 91 10/27/2022   Lab Results  Component Value Date   CHOL 176 05/03/2023   Lab Results  Component Value Date   HDL 54 05/03/2023   Lab Results  Component Value Date   LDLCALC  112 (H) 05/03/2023   Lab Results  Component Value Date   TRIG 50 05/03/2023   Lab Results  Component Value Date   CHOLHDL 3.3 05/03/2023   Lab Results  Component Value Date   HGBA1C 5.8 08/08/2023      Assessment & Plan:   Problem List Items Addressed This Visit       Cardiovascular and Mediastinum   Essential hypertension, benign - Primary   BP Readings from Last 1 Encounters:  08/14/23 123/82   Well-controlled with losartan 100 mg QD, hydrochlorothiazide 25 mg once daily, Spironolactone 50 mg once daily, and clonidine 0.2 mg twice daily Decrease dose of Clonidine to 0.2 mg qHS for now due to drowsiness with the morning dose Counseled for compliance with the medications Advised DASH diet and moderate exercise/walking, at least 150 mins/week        Respiratory   OSA (obstructive sleep apnea)   Uses CPAP, advised to use it regularly        Endocrine   Diabetes mellitus, stable (HCC)   Lab Results  Component Value Date   HGBA1C 5.8 08/08/2023   Well-controlled Associated with HTN and HLD On metformin 500 mg QD and Mounjaro 5 mg qw Followed by Dr. 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   Follow-up: Return in about 6 months (around 02/14/2024) for Annual physical (after 02/13/24).    Anabel Halon, MD

## 2023-08-14 NOTE — Assessment & Plan Note (Addendum)
 Lab Results  Component Value Date   HGBA1C 5.8 08/08/2023   Well-controlled Associated with HTN and HLD On metformin 500 mg QD and Mounjaro 5 mg qw Followed by Dr. Fransico Him Advised to follow diabetic diet On statin and ARB F/u CMP and lipid panel Diabetic eye exam: Advised to follow up with Ophthalmology for diabetic eye exam

## 2023-08-14 NOTE — Assessment & Plan Note (Signed)
 Lab Results  Component Value Date   TSH 4.280 07/31/2023   On levothyroxine 112 mcg QD Followed by Endocrinology

## 2023-08-14 NOTE — Telephone Encounter (Signed)
 Rx for Mounjaro 5mg  SQ weekly sent to [facility] on [address] in [place].

## 2023-08-14 NOTE — Assessment & Plan Note (Signed)
Uses CPAP, advised to use it regularly

## 2023-08-14 NOTE — Patient Instructions (Addendum)
 Please take Clonidine only in the evening.  Please continue to take medications as prescribed.  Please continue to follow low carb diet and perform moderate exercise/walking at least 150 mins/week.

## 2023-08-17 NOTE — Assessment & Plan Note (Signed)
S/p left lumpectomy and adjuvant radiation On Anastrazole Followed by Oncology 

## 2023-08-17 NOTE — Assessment & Plan Note (Signed)
On Lipitor 40 mg QD

## 2023-08-28 ENCOUNTER — Ambulatory Visit (HOSPITAL_COMMUNITY)
Admission: RE | Admit: 2023-08-28 | Discharge: 2023-08-28 | Disposition: A | Discharge status: Home / Self Care | Source: Ambulatory Visit | Attending: Internal Medicine | Admitting: Internal Medicine

## 2023-08-28 DIAGNOSIS — Z0000 Encounter for general adult medical examination without abnormal findings: Secondary | ICD-10-CM | POA: Diagnosis not present

## 2023-08-28 DIAGNOSIS — Z780 Asymptomatic menopausal state: Secondary | ICD-10-CM | POA: Diagnosis not present

## 2023-08-28 DIAGNOSIS — M810 Age-related osteoporosis without current pathological fracture: Secondary | ICD-10-CM | POA: Diagnosis not present

## 2023-08-28 NOTE — Telephone Encounter (Signed)
 Copied from CRM [id number]. Topic: Clinical - Lab/Test Results >> Aug 28, 2023 11:58 AM Shelah Lewandowsky wrote: Reason for CRM: returning call about results- please call [phone]

## 2023-10-11 ENCOUNTER — Ambulatory Visit: Payer: Self-pay

## 2023-10-11 ENCOUNTER — Ambulatory Visit
Admission: EM | Admit: 2023-10-11 | Discharge: 2023-10-11 | Disposition: A | Discharge status: Home / Self Care | Attending: Nurse Practitioner | Admitting: Nurse Practitioner

## 2023-10-11 ENCOUNTER — Telehealth: Payer: Self-pay | Admitting: Emergency Medicine

## 2023-10-11 DIAGNOSIS — I10 Essential (primary) hypertension: Secondary | ICD-10-CM

## 2023-10-11 DIAGNOSIS — K6289 Other specified diseases of anus and rectum: Secondary | ICD-10-CM | POA: Diagnosis not present

## 2023-10-11 DIAGNOSIS — K5900 Constipation, unspecified: Secondary | ICD-10-CM

## 2023-10-11 MED ORDER — POLYETHYLENE GLYCOL 3350 17 GM/SCOOP PO POWD: 17.0000 g | Freq: Two times a day (BID) | ORAL | 0 refills | Status: AC | PRN

## 2023-10-11 MED ORDER — HYDROCORTISONE ACETATE 25 MG RE SUPP: 25.0000 mg | Freq: Two times a day (BID) | RECTAL | 0 refills | Status: AC

## 2023-10-11 MED ORDER — SENNOSIDES-DOCUSATE SODIUM 8.6-50 MG PO TABS: 2.0000 | ORAL_TABLET | Freq: Every evening | ORAL | 0 refills | Status: AC | PRN

## 2023-10-11 MED ORDER — POLYETHYLENE GLYCOL 3350 17 GM/SCOOP PO POWD: 510.0000 g | Freq: Once | ORAL | 0 refills | Status: DC

## 2023-10-11 NOTE — ED Triage Notes (Signed)
 Pt reports constipation and hemorrhoids, pt states she has been trying OTC Maalox, and stool softeners has found no relief.

## 2023-10-11 NOTE — Discharge Instructions (Addendum)
 Take medication as prescribed. Increase fluids.  Make sure you are drinking at least 8-10 8 ounce glasses of water  daily. Make sure you are eating a diet that contains lots of fruits and vegetables to help prevent constipation. Try to remain active. May try warm Epsom salt soaks as needed to help with rectal pain or discomfort. May apply cool compresses to the rectal area to help with pain or discomfort. If symptoms continue to persist, please speak with your primary care physician to determine if referral to gastroenterology is necessary.  For your blood pressure: Take your blood pressure medication as soon as you get home. Go to the emergency department immediately if you develop chest pain, shortness of breath, or difficulty breathing. Follow-up as needed.

## 2023-10-11 NOTE — Telephone Encounter (Signed)
  Chief Complaint: Constipation, abdominal pain, rectal pain Symptoms: see above Frequency: 'last few days' Pertinent Negatives: Patient denies blood in stool Disposition: [] ED /[x] Urgent Care (no appt availability in office) / [] Appointment(In office/virtual)/ []  [place]/ [] Home Care/ [] Refused Recommended Disposition /[] [facility]/ []  Follow-up with PCP Additional Notes: Patient's daughter called in with patient on the line stating patient has had constipation for the last few days, accompanied by stomach burning and discomfort. Patient is also reporting severe rectal pain and inability to have a bowel movement. Patient took a laxative and does not have relief. Patient appt [facility] at [facility].   Copied from CRM [id number]. Topic: Clinical - Red Word Triage >> Oct 11, 2023  8:35 AM Turkey B wrote: [facility] that prompted transfer to Nurse Triage: pt has burning in stomach, can't move bowels, severe pain in buttocks and stomach Reason for Disposition  [1] Constant abdominal pain AND [2] present > 2 hours  Answer Assessment - Initial Assessment Questions 1. STOOL PATTERN OR FREQUENCY: "How often do you have a bowel movement (BM)?"  (Normal range: 3 times a day to every 3 days)  "When was your last BM?"       Daily 2. STRAINING: "Do you have to strain to have a BM?"      Yes  3. RECTAL PAIN: "Does your rectum hurt when the stool comes out?" If Yes, ask: "Do you have hemorrhoids? How bad is the pain?"  (Scale 1-10; or mild, moderate, severe)     Rectal pain - 9 4. STOOL COMPOSITION: "Are the stools hard?"      No - patient states they're soft because of laxative 5. BLOOD ON STOOLS: "Has there been any blood on the toilet tissue or on the surface of the BM?" If Yes, ask: "When was the last time?"     Not that patient can tell 6. CHRONIC CONSTIPATION: "Is this a new problem for you?"  If No, ask: "How long have you had this problem?" (days, weeks, months)      Worsened last  few  7. CHANGES IN DIET OR HYDRATION: "Have there been any recent changes in your diet?" "How much fluids are you drinking on a daily basis?"  "How much have you had to drink today?"     Patient states she's been drinking a lot of water  8. MEDICINES: "Have you been taking any new medicines?" "Are you taking any narcotic pain medicines?" (e.g., Dilaudid , morphine, Percocet, Vicodin)     On weight loss injections - Mounjaro - about 6 months 9. LAXATIVES: "Have you been using any stool softeners, laxatives, or enemas?"  If Yes, ask "What, how often, and when was the last time?"     Used laxative yesterday 11. CAUSE: "What do you think is causing the constipation?"        Patient thinks maybe Mounjaro 12. OTHER SYMPTOMS: "Do you have any other symptoms?" (e.g., abdomen pain, bloating, fever, vomiting)       Abdominal pain/burning, upper thigh pain, rectal pain 13. MEDICAL HISTORY: "Do you have a history of hemorrhoids, rectal fissures, or rectal surgery or rectal abscess?"         Hx of hemorrhoids with pregnancy  Protocols used: Constipation-A-AH

## 2023-10-11 NOTE — ED Provider Notes (Addendum)
 [facility]    CSN: [id number] Arrival date & time: 10/11/23  1007      History   Chief Complaint No chief complaint on file.   HPI Frances Hughes is a 68 y.o. female.   The history is provided by the patient.   Patient presents for complaints of constipation and rectal pain is been present for the past 3 days.  She also complains of pain in her lower abdomen.  Patient states that she is currently taking Mounjaro, and was unaware that it could cause constipation.  States that she recently started a stool softener.  She states that she does have pain in her rectum that is causing her to avoid a bowel movement.  She complains of pain with sitting.  She states that she was in so much pain over the last day or so, that she has not taken her blood pressure medication.  She is moderately to severely hypertensive in triage.  Patient states her last episode of hemorrhoids was when she was pregnant with her twins 45 years ago.  Patient denies gas, bloating, bloody stools, diarrhea, urinary symptoms, or low back pain.  Patient has tried over-the-counter Dulcolax, use an over-the-counter stool softener, and a warm bath for her symptoms with minimal relief.  Past Medical History:  Diagnosis Date   Adrenal mass, left (HCC)    Breast cancer (HCC)    Degenerative disc disease at L5-S1 level    Depression 07/15/2011   DM (diabetes mellitus) (HCC)    Type II controlled   Enlarged heart    Family history of breast cancer    Family history of colon cancer    Family history of pancreatic cancer    Family history of stomach cancer    Grave's disease    History of kidney stones    HTN (hypertension)    Hyperlipidemia    Hypothyroidism    Lymphocytosis    Nephrolithiasis    OSA on CPAP     Patient Active Problem List   Diagnosis Date Noted   Long-term (current) use of injectable non-insulin  antidiabetic drugs 08/08/2023   Long term (current) use of oral hypoglycemic drugs  11/04/2022   Acute non-recurrent frontal sinusitis 08/25/2022   Onychomycosis 09/29/2021   Family history of colon cancer    Family history of breast cancer    Family history of pancreatic cancer    Family history of stomach cancer    Hypothyroidism following radioiodine therapy 04/15/2020   Adrenal adenoma, left 03/17/2020   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   GERD (gastroesophageal reflux disease) 07/17/2015   Encounter for general adult medical examination with abnormal findings 12/06/2013   Diastolic dysfunction 12/06/2013   Systolic murmur 11/08/2013   Urinary incontinence 09/16/2012   Leg edema 04/05/2012   Localized swelling, mass and lump, neck 07/15/2011   Morbid obesity (HCC) 07/15/2011   Diabetes mellitus, stable (HCC) 04/05/2006   Mixed hyperlipidemia 04/05/2006   OSA (obstructive sleep apnea) 04/05/2006   Essential hypertension, benign 04/05/2006    Past Surgical History:  Procedure Laterality Date   ABDOMINAL HYSTERECTOMY     BREAST BIOPSY Left 01/06/2020   BREAST LUMPECTOMY WITH RADIOACTIVE SEED LOCALIZATION Left 02/19/2020   Procedure: LEFT BREAST LUMPECTOMY WITH RADIOACTIVE SEED LOCALIZATION;  Surgeon: Oza Blumenthal, MD;  Location: [facility];  Service: General;  Laterality: Left;   COLONOSCOPY  2011   normal colonoscopy   COLONOSCOPY WITH PROPOFOL  N/A 04/16/2018   Procedure:  COLONOSCOPY WITH PROPOFOL ;  Surgeon: Suzette Espy, MD;  Location: [facility];  Service: Endoscopy;  Laterality: N/A;  9:45am   COLONOSCOPY WITH PROPOFOL  N/A 01/26/2022   Procedure: COLONOSCOPY WITH PROPOFOL ;  Surgeon: Suzette Espy, MD;  Location: [facility];  Service: Endoscopy;  Laterality: N/A;  11:15 am   HERNIA REPAIR  2011   LITHOTRIPSY     POLYPECTOMY  04/16/2018   Procedure: POLYPECTOMY;  Surgeon: Suzette Espy, MD;  Location: [facility];  Service: Endoscopy;;  (colon)   POLYPECTOMY  01/26/2022   Procedure: POLYPECTOMY;   Surgeon: Suzette Espy, MD;  Location: [facility];  Service: Endoscopy;;   S/P Hysterectomy  10/1996   with bilat SOO seconardt to fibroids   thyroid  ablation  1986    OB History   No obstetric history on file.      Home Medications    Prior to Admission medications   Medication Sig Start Date End Date Taking? Authorizing Provider  anastrozole  (ARIMIDEX ) 1 MG tablet TAKE 1 TABLET(1 MG) BY MOUTH DAILY. START 06/08/2020 07/11/23   Iruku, Praveena, MD  aspirin  81 MG EC tablet Take 81 mg by mouth 3 (three) times a week. Swallow whole.    [provider]  atorvastatin  (LIPITOR) 40 MG tablet TAKE 1 TABLET(40 MG) BY MOUTH DAILY 08/03/23   Nida, Gebreselassie W, MD  Blood Glucose Monitoring Suppl (BLOOD GLUCOSE MONITOR SYSTEM) W/DEVICE KIT Please dispense based on patient and insurance preference. Use to monitor fasting blood sugar 1x daily. Dx: E11.9 10/09/14   Mathis Som, MD  cloNIDine  (CATAPRES ) 0.2 MG tablet Take 1 tablet (0.2 mg total) by mouth 2 (two) times daily. 02/13/23   Meldon Sport, MD  furosemide  (LASIX ) 20 MG tablet TAKE 1 TABLET(20 MG) BY MOUTH DAILY AS NEEDED 08/03/23   Meldon Sport, MD  Glucose Blood (BLOOD GLUCOSE TEST STRIPS) STRP Please dispense based on patient and insurance preference. Use to monitor fasting blood sugar 1x daily. Dx: E11.9 01/29/16   Mathis Som, MD  hydrochlorothiazide  (HYDRODIURIL ) 25 MG tablet Take 1 tablet (25 mg total) by mouth daily with breakfast. 08/08/23   Nida, Jaynee Meyer, MD  Lancets 28G MISC Please dispense based on patient and insurance preference. Use to monitor fasting blood sugar 1x daily. Dx: E11.9 10/09/14   Mathis Som, MD  levothyroxine  (SYNTHROID ) 100 MCG tablet Take 1 tablet (100 mcg total) by mouth daily before breakfast. 05/10/23   Nida, Gebreselassie W, MD  losartan  (COZAAR ) 100 MG tablet TAKE 1 TABLET(100 MG) BY MOUTH DAILY 08/03/23   Meldon Sport, MD  metFORMIN  (GLUCOPHAGE ) 500 MG tablet Take 1  tablet (500 mg total) by mouth daily with breakfast. 01/24/23   Nida, Gebreselassie W, MD  Multiple Vitamin (MULTIVITAMIN WITH MINERALS) TABS tablet Take 1 tablet by mouth daily.    [provider]  potassium chloride  SA (KLOR-CON  M) 20 MEQ tablet Take 1 tablet (20 mEq total) by mouth 2 (two) times daily. 05/10/23   Nida, Gebreselassie W, MD  spironolactone  (ALDACTONE ) 50 MG tablet Take 1 tablet (50 mg total) by mouth daily. 05/10/23   Nida, Gebreselassie W, MD  tirzepatide [facility]) 5 MG/0.5ML Pen Inject 5 mg into the skin once a week. 08/14/23   Baby Bolt, MD    Family History Family History  Problem Relation Age of Onset   Colon cancer Mother        d. 29   Cancer Mother  metastisis sarcoma   Colon cancer Sister 76   Head & neck cancer Sister        roof of mouth   Colon polyps Sister    Diabetes Father    Hypertension Father    Cancer Father    Lung cancer Father        d. 25   Colon polyps Sister    Obesity Sister    Esophageal cancer Brother    Cancer Brother        cancer of eye   Aneurysm Maternal Aunt        brain   Cancer Maternal Uncle        NOS   COPD Maternal Uncle    Stomach cancer Maternal Grandfather 76   Heart attack Maternal Grandfather    Pancreatic cancer Paternal Grandmother        d. 31   Cancer Maternal Uncle        NOS - mother's pat 1/2 brother   Breast cancer Cousin        3 maternal cousins with breast cancer, some under 50, all different branches   Colon cancer Cousin        five mat first cousins, 4 were siblings   Cancer Cousin        2 mat 1/2 cousins with NOS cancer    Social History Social History   Tobacco Use   Smoking status: Former    Current packs/day: 0.00    Average packs/day: 0.8 packs/day for 19.3 years (14.5 ttl pk-yrs)    Types: Cigarettes    Start date: 01/30/1969    Quit date: 05/23/1988    Years since quitting: 35.4    Passive exposure: Past   Smokeless tobacco: Never   Tobacco comments:     I have not smoked in over 34 years  Vaping Use   Vaping status: Never Used  Substance Use Topics   Alcohol use: No   Drug use: No     Allergies   Patient has no known allergies.   Review of Systems Review of Systems Per HPI  Physical Exam Triage Vital Signs ED Triage Vitals  Encounter Vitals Group     BP 10/11/23 1013 (!) 186/122     Systolic BP Percentile --      Diastolic BP Percentile --      Pulse Rate 10/11/23 1013 (!) 117     Resp 10/11/23 1013 16     Temp 10/11/23 1013 98.3 F (36.8 C)     Temp Source 10/11/23 1013 Oral     SpO2 10/11/23 1013 94 %     Weight --      Height --      Head Circumference --      Peak Flow --      Pain Score 10/11/23 1016 9     Pain Loc --      Pain Education --      Exclude from Growth Chart --    No data found.  Updated Vital Signs BP (!) 186/122 (BP Location: Right Arm)   Pulse (!) 117   Temp 98.3 F (36.8 C) (Oral)   Resp 16   SpO2 94%   Visual Acuity Right Eye Distance:   Left Eye Distance:   Bilateral Distance:    Right Eye Near:   Left Eye Near:    Bilateral Near:     Physical Exam Vitals and nursing note reviewed.  Constitutional:  General: She is not in acute distress.    Appearance: Normal appearance.  HENT:     Head: Normocephalic.  Eyes:     Extraocular Movements: Extraocular movements intact.     Conjunctiva/sclera: Conjunctivae normal.     Pupils: Pupils are equal, round, and reactive to light.  Cardiovascular:     Rate and Rhythm: Normal rate and regular rhythm.     Pulses: Normal pulses.     Heart sounds: Normal heart sounds.  Pulmonary:     Effort: Pulmonary effort is normal.     Breath sounds: Normal breath sounds.  Abdominal:     General: Bowel sounds are normal.     Palpations: Abdomen is soft.     Tenderness: There is no abdominal tenderness.  Musculoskeletal:     Cervical back: Normal range of motion.  Skin:    General: Skin is warm and dry.  Neurological:     General:  No focal deficit present.     Mental Status: She is alert and oriented to person, place, and time.  Psychiatric:        Mood and Affect: Mood normal.        Behavior: Behavior normal.      UC Treatments / Results  Labs (all labs ordered are listed, but only abnormal results are displayed) Labs Reviewed - No data to display  EKG   Radiology No results found.  Procedures Procedures (including critical care time)  Medications Ordered in UC Medications - No data to display  Initial Impression / Assessment and Plan / UC Course  I have reviewed the triage vital signs and the nursing notes.  Pertinent labs & imaging results that were available during my care of the patient were reviewed by me and considered in my medical decision making (see chart for details).  Patient presents for complaints of constipation and rectal pain, defer evaluation of rectum at this time.  Will start Anusol 25 mg suppositories for rectal pain and discomfort, MiraLAX 17 g daily for constipation, and Senokot 8.6/50 mcg tablets for constipation.  Supportive care recommendations were provided and discussed with the patient to include fluids, warm Epsom salt soaks, and cool compresses to the rectum to help with pain or discomfort.  With regard to patient's blood pressure, patient advised to take blood pressure medicine as soon as she gets home.  BP rechecked at time of disc charge, BP was 166/120, heart rate 106.  Patient was given strict ER follow-up precautions to include chest pain, shortness of breath, or difficulty breathing.  Patient was in agreement with this plan of care and verbalizes understanding.  All questions were answered.  Patient stable for discharge.  Final Clinical Impressions(s) / UC Diagnoses   Final diagnoses:  None   Discharge Instructions   None    ED Prescriptions   None    PDMP not reviewed this encounter.   Hardy Lia, NP 10/11/23 1051    Leath-Warren,  Belen Bowers, NP 10/11/23 1052

## 2023-10-12 ENCOUNTER — Encounter: Payer: Self-pay | Admitting: Internal Medicine

## 2023-10-12 ENCOUNTER — Ambulatory Visit (INDEPENDENT_AMBULATORY_CARE_PROVIDER_SITE_OTHER): Admitting: Internal Medicine

## 2023-10-12 VITALS — BP 120/76 | HR 68 | Ht 63.0 in | Wt 221.6 lb

## 2023-10-12 DIAGNOSIS — K5904 Chronic idiopathic constipation: Secondary | ICD-10-CM | POA: Diagnosis not present

## 2023-10-12 DIAGNOSIS — M816 Localized osteoporosis [Lequesne]: Secondary | ICD-10-CM | POA: Insufficient documentation

## 2023-10-12 DIAGNOSIS — G8929 Other chronic pain: Secondary | ICD-10-CM | POA: Diagnosis not present

## 2023-10-12 DIAGNOSIS — K649 Unspecified hemorrhoids: Secondary | ICD-10-CM | POA: Diagnosis not present

## 2023-10-12 DIAGNOSIS — M810 Age-related osteoporosis without current pathological fracture: Secondary | ICD-10-CM | POA: Insufficient documentation

## 2023-10-12 DIAGNOSIS — M25512 Pain in left shoulder: Secondary | ICD-10-CM | POA: Diagnosis not present

## 2023-10-12 HISTORY — DX: Age-related osteoporosis without current pathological fracture: M81.0

## 2023-10-12 NOTE — Assessment & Plan Note (Signed)
 Likely related to trapezius strain, due to sleeping posture Advised to apply IcyHot or Bengay locally If persistent, will consider Flexeril

## 2023-10-12 NOTE — Progress Notes (Signed)
 Acute Office Visit  Subjective:    [name], female    DOB: 1956/05/12, 68 y.o.   MRN: [id number]  Chief Complaint  Patient presents with   Constipation    Pt reports sx of constipation and hemorrhoids.    Shoulder Pain    Pt reports left shoulder pain ongoing for a 3 months.     HPI Patient is in today for complaint of constipation and rectal discomfort for the last 5 days. She went to urgent care yesterday. She did not have a bowel movement for the 3 days prior to urgent care visit.  She had difficulty passing stool due to rectal pain. She was given Senokot-S and MiraLAX  for constipation.  She was also given Anusol  HC suppository for hemorrhoids.  She tried sitz bath yesterday with some relief.  She noticed improvement in rectal pain and had a BM this morning.  Denies any melena or hematochezia.  She has a history of internal hemorrhoids, noted on colonoscopy.  Denies any nausea or vomiting.  Denies fever or chills.  She reports chronic, recurrent neck pain, radiating towards left shoulder.  She has neck movement pain, especially with flexion.  Denies any new onset numbness or tingling of the UE.  Past Medical History:  Diagnosis Date   Adrenal mass, left (HCC)    Age-related osteoporosis without current pathological fracture 10/12/2023   Breast cancer (HCC)    Degenerative disc disease at L5-S1 level    Depression 07/15/2011   DM (diabetes mellitus) (HCC)    Type II controlled   Enlarged heart    Family history of breast cancer    Family history of colon cancer    Family history of pancreatic cancer    Family history of stomach cancer    Grave's disease    History of kidney stones    HTN (hypertension)    Hyperlipidemia    Hypothyroidism    Lymphocytosis    Nephrolithiasis    OSA on CPAP     Past Surgical History:  Procedure Laterality Date   ABDOMINAL HYSTERECTOMY     BREAST BIOPSY Left 01/06/2020   BREAST LUMPECTOMY WITH RADIOACTIVE SEED  LOCALIZATION Left 02/19/2020   Procedure: LEFT BREAST LUMPECTOMY WITH RADIOACTIVE SEED LOCALIZATION;  Surgeon: Oza Blumenthal, MD;  Location: [facility];  Service: General;  Laterality: Left;   COLONOSCOPY  2011   normal colonoscopy   COLONOSCOPY WITH PROPOFOL  N/A 04/16/2018   Procedure: COLONOSCOPY WITH PROPOFOL ;  Surgeon: Suzette Espy, MD;  [facility];  Service: Endoscopy;  Laterality: N/A;  9:45am   COLONOSCOPY WITH PROPOFOL  N/A 01/26/2022   Procedure: COLONOSCOPY WITH PROPOFOL ;  Surgeon: Suzette Espy, MD;  [facility];  Service: Endoscopy;  Laterality: N/A;  11:15 am   HERNIA REPAIR  2011   LITHOTRIPSY     POLYPECTOMY  04/16/2018   Procedure: POLYPECTOMY;  Surgeon: Suzette Espy, MD;  [facility];  Service: Endoscopy;;  (colon)   POLYPECTOMY  01/26/2022   Procedure: POLYPECTOMY;  Surgeon: Suzette Espy, MD;  [facility];  Service: Endoscopy;;   S/P Hysterectomy  10/1996   with bilat SOO seconardt to fibroids   thyroid  ablation  1986    Family History  Problem Relation Age of Onset   Colon cancer Mother        d. 95   Cancer Mother        metastisis sarcoma   Colon  cancer Sister 58   Head & neck cancer Sister        roof of mouth   Colon polyps Sister    Diabetes Father    Hypertension Father    Cancer Father    Lung cancer Father        d. 35   Colon polyps Sister    Obesity Sister    Esophageal cancer Brother    Cancer Brother        cancer of eye   Aneurysm Maternal Aunt        brain   Cancer Maternal Uncle        NOS   COPD Maternal Uncle    Stomach cancer Maternal Grandfather 76   Heart attack Maternal Grandfather    Pancreatic cancer Paternal Grandmother        d. 46   Cancer Maternal Uncle        NOS - mother's pat 1/2 brother   Breast cancer Cousin        3 maternal cousins with breast cancer, some under 50, all different branches   Colon cancer Cousin        five mat first  cousins, 4 were siblings   Cancer Cousin        2 mat 1/2 cousins with NOS cancer    Social History   Socioeconomic History   Marital status: Married    Spouse name: Adryan Druckenmiller   Number of children: Not on file   Years of education: 12   Highest education level: Bachelor's degree (e.g., BA, AB, BS)  Occupational History   Not on file  Tobacco Use   Smoking status: Former    Current packs/day: 0.00    Average packs/day: 0.8 packs/day for 19.3 years (14.5 ttl pk-yrs)    Types: Cigarettes    Start date: 01/30/1969    Quit date: 05/23/1988    Years since quitting: 35.4    Passive exposure: Past   Smokeless tobacco: Never   Tobacco comments:    I have not smoked in over 34 years  Vaping Use   Vaping status: Never Used  Substance and Sexual Activity   Alcohol use: No   Drug use: No   Sexual activity: Yes    Partners: Male    Comment: Hysterectomy  Other Topics Concern   Not on file  Social History Narrative   Not on file   Social Drivers of Health   Financial Resource Strain: Low Risk  (06/26/2023)   Overall Financial Resource Strain (CARDIA)    Difficulty of Paying Living Expenses: Not very hard  Food Insecurity: No Food Insecurity (06/26/2023)   Hunger Vital Sign    Worried About Running Out of Food in the Last Year: Never true    Ran Out of Food in the Last Year: Never true  Transportation Needs: No Transportation Needs (06/26/2023)   PRAPARE - Administrator, Civil Service (Medical): No    Lack of Transportation (Non-Medical): No  Physical Activity: Sufficiently Active (06/26/2023)   Exercise Vital Sign    Days of Exercise per Week: 3 days    Minutes of Exercise per Session: 90 min  Stress: Stress Concern Present (01/02/2023)   [employer] of Occupational Health - Occupational Stress Questionnaire    Feeling of Stress : Rather much  Social Connections: Socially Integrated (06/26/2023)   Social Connection and Isolation Panel [NHANES]    Frequency of  Communication with Friends and Family: More than  three times a week    Frequency of Social Gatherings with Friends and Family: More than three times a week    Attends Religious Services: More than 4 times per year    Active Member of [employer] or Organizations: Yes    Attends Engineer, structural: More than 4 times per year    Marital Status: Married  Catering manager Violence: Not At Risk (01/04/2023)   Humiliation, Afraid, Rape, and Kick questionnaire    Fear of Current or Ex-Partner: No    Emotionally Abused: No    Physically Abused: No    Sexually Abused: No    Outpatient Medications Prior to Visit  Medication Sig Dispense Refill   anastrozole  (ARIMIDEX ) 1 MG tablet TAKE 1 TABLET(1 MG) BY MOUTH DAILY. START 06/08/2020 90 tablet 4   aspirin  81 MG EC tablet Take 81 mg by mouth 3 (three) times a week. Swallow whole.     atorvastatin  (LIPITOR) 40 MG tablet TAKE 1 TABLET(40 MG) BY MOUTH DAILY 90 tablet 1   Blood Glucose Monitoring Suppl (BLOOD GLUCOSE MONITOR SYSTEM) W/DEVICE KIT Please dispense based on patient and insurance preference. Use to monitor fasting blood sugar 1x daily. Dx: E11.9 1 each 0   cloNIDine  (CATAPRES ) 0.2 MG tablet Take 1 tablet (0.2 mg total) by mouth 2 (two) times daily. 180 tablet 1   Glucose Blood (BLOOD GLUCOSE TEST STRIPS) STRP Please dispense based on patient and insurance preference. Use to monitor fasting blood sugar 1x daily. Dx: E11.9 100 each 0   hydrochlorothiazide  (HYDRODIURIL ) 25 MG tablet Take 1 tablet (25 mg total) by mouth daily with breakfast. 90 tablet 1   hydrocortisone (ANUSOL-HC) 25 MG suppository Place 1 suppository (25 mg total) rectally 2 (two) times daily. 24 suppository 0   Lancets 28G MISC Please dispense based on patient and insurance preference. Use to monitor fasting blood sugar 1x daily. Dx: E11.9 100 each 0   levothyroxine  (SYNTHROID ) 100 MCG tablet Take 1 tablet (100 mcg total) by mouth daily before breakfast. 90 tablet 1    losartan  (COZAAR ) 100 MG tablet TAKE 1 TABLET(100 MG) BY MOUTH DAILY 90 tablet 1   metFORMIN  (GLUCOPHAGE ) 500 MG tablet Take 1 tablet (500 mg total) by mouth daily with breakfast. 90 tablet 1   Multiple Vitamin (MULTIVITAMIN WITH MINERALS) TABS tablet Take 1 tablet by mouth daily.     polyethylene glycol powder (MIRALAX) 17 GM/SCOOP powder Take 17 g by mouth 2 (two) times daily as needed for moderate constipation. 255 g 0   potassium chloride  SA (KLOR-CON  M) 20 MEQ tablet Take 1 tablet (20 mEq total) by mouth 2 (two) times daily. 180 tablet 1   senna-docusate (SENOKOT-S) 8.6-50 MG tablet Take 2 tablets by mouth at bedtime as needed for mild constipation. 60 tablet 0   spironolactone  (ALDACTONE ) 50 MG tablet Take 1 tablet (50 mg total) by mouth daily. 90 tablet 1   tirzepatide (MOUNJARO) 5 MG/0.5ML Pen Inject 5 mg into the skin once a week. 6 mL 0   furosemide  (LASIX ) 20 MG tablet TAKE 1 TABLET(20 MG) BY MOUTH DAILY AS NEEDED (Patient not taking: Reported on 10/12/2023) 30 tablet 1   No facility-administered medications prior to visit.    No Known Allergies  Review of Systems  Constitutional:  Positive for fatigue. Negative for chills and fever.  HENT:  Negative for congestion, sinus pressure, sinus pain and sore throat.   Eyes:  Negative for pain and discharge.  Respiratory:  Negative for cough and  shortness of breath.   Cardiovascular:  Negative for chest pain and palpitations.  Gastrointestinal:  Positive for constipation and rectal pain. Negative for abdominal pain, diarrhea, nausea and vomiting.  Endocrine: Negative for polydipsia and polyuria.  Genitourinary:  Negative for dysuria and hematuria.  Musculoskeletal:  Negative for neck pain and neck stiffness.  Skin:  Negative for rash.  Neurological:  Negative for dizziness and weakness.  Psychiatric/Behavioral:  Negative for agitation and behavioral problems.        Objective:     Physical Exam Vitals reviewed.  Constitutional:       General: She is not in acute distress.    Appearance: She is obese. She is not diaphoretic.  HENT:     Head: Normocephalic and atraumatic.     Nose: Nose normal.     Mouth/Throat:     Mouth: Mucous membranes are moist.  Eyes:     General: No scleral icterus.    Extraocular Movements: Extraocular movements intact.  Cardiovascular:     Rate and Rhythm: Normal rate and regular rhythm.     Heart sounds: Normal heart sounds. No murmur heard. Pulmonary:     Breath sounds: Normal breath sounds. No wheezing or rales.  Abdominal:     General: Bowel sounds are normal.     Palpations: Abdomen is soft.     Tenderness: There is no abdominal tenderness.  Musculoskeletal:     Cervical back: Neck supple. Pain with movement present.     Right lower leg: No edema.     Left lower leg: No edema.  Skin:    General: Skin is warm.     Findings: No rash.  Neurological:     General: No focal deficit present.     Mental Status: She is alert and oriented to person, place, and time.     Sensory: No sensory deficit.     Motor: No weakness.  Psychiatric:        Mood and Affect: Mood normal.        Behavior: Behavior normal.     BP 120/76   Pulse 68   Ht 5\' 3"  (1.6 m)   Wt 221 lb 9.6 oz (100.5 kg)   SpO2 95%   BMI 39.25 kg/m  Wt Readings from Last 3 Encounters:  10/12/23 221 lb 9.6 oz (100.5 kg)  08/14/23 226 lb 6.4 oz (102.7 kg)  08/08/23 232 lb (105.2 kg)        Assessment & Plan:   Problem List Items Addressed This Visit       Cardiovascular and Mediastinum   Hemorrhoids   Has history of internal hemorrhoids Needs to take stool softeners to avoid constipation Maintain adequate hydration Anusol HC suppository for hemorrhoids        Digestive   Chronic idiopathic constipation - Primary   Urgent care chart reviewed Advised to take Senokot-S for constipation MiraLAX as needed for persistent constipation Advised to maintain at least 64 ounces of fluid intake in a  day Increase portion of green vegetables in diet        Other   Chronic left shoulder pain   Likely related to trapezius strain, due to sleeping posture Advised to apply IcyHot or Bengay locally If persistent, will consider Flexeril        No orders of the defined types were placed in this encounter.    Meldon Sport, MD

## 2023-10-12 NOTE — Patient Instructions (Addendum)
 Please start taking Senokot-S once daily. If persistent constipation, please start taking Miralax as needed, up to twice daily.  Please use Anusol suppository for hemorrhoid.

## 2023-10-12 NOTE — Assessment & Plan Note (Addendum)
 Urgent care chart reviewed Advised to take Senokot-S for constipation MiraLAX as needed for persistent constipation Advised to maintain at least 64 ounces of fluid intake in a day Increase portion of green vegetables in diet

## 2023-10-12 NOTE — Assessment & Plan Note (Signed)
 Has history of internal hemorrhoids Needs to take stool softeners to avoid constipation Maintain adequate hydration Anusol HC suppository for hemorrhoids

## 2023-10-30 ENCOUNTER — Other Ambulatory Visit: Payer: Self-pay | Admitting: "Endocrinology

## 2023-10-30 DIAGNOSIS — E119 Type 2 diabetes mellitus without complications: Secondary | ICD-10-CM

## 2023-11-05 ENCOUNTER — Other Ambulatory Visit: Payer: Self-pay | Admitting: Internal Medicine

## 2023-11-05 DIAGNOSIS — R600 Localized edema: Secondary | ICD-10-CM

## 2023-11-30 ENCOUNTER — Other Ambulatory Visit: Payer: Self-pay | Admitting: Internal Medicine

## 2023-11-30 DIAGNOSIS — I10 Essential (primary) hypertension: Secondary | ICD-10-CM

## 2023-12-04 ENCOUNTER — Other Ambulatory Visit: Payer: Self-pay | Admitting: "Endocrinology

## 2023-12-04 ENCOUNTER — Other Ambulatory Visit: Payer: Self-pay | Admitting: Internal Medicine

## 2023-12-04 DIAGNOSIS — I10 Essential (primary) hypertension: Secondary | ICD-10-CM

## 2023-12-13 ENCOUNTER — Telehealth: Payer: Self-pay

## 2023-12-13 NOTE — Telephone Encounter (Signed)
 Tried to return pt's call, did not receive an answer and was unable to leave a message.

## 2023-12-19 ENCOUNTER — Ambulatory Visit: Admitting: "Endocrinology

## 2023-12-19 ENCOUNTER — Encounter: Payer: Self-pay | Admitting: "Endocrinology

## 2023-12-19 VITALS — BP 122/76 | HR 56 | Ht 63.0 in | Wt 212.8 lb

## 2023-12-19 DIAGNOSIS — I10 Essential (primary) hypertension: Secondary | ICD-10-CM

## 2023-12-19 DIAGNOSIS — D3502 Benign neoplasm of left adrenal gland: Secondary | ICD-10-CM | POA: Diagnosis not present

## 2023-12-19 DIAGNOSIS — M816 Localized osteoporosis [Lequesne]: Secondary | ICD-10-CM | POA: Diagnosis not present

## 2023-12-19 DIAGNOSIS — E119 Type 2 diabetes mellitus without complications: Secondary | ICD-10-CM | POA: Diagnosis not present

## 2023-12-19 DIAGNOSIS — E890 Postprocedural hypothyroidism: Secondary | ICD-10-CM | POA: Diagnosis not present

## 2023-12-19 DIAGNOSIS — Z7985 Long-term (current) use of injectable non-insulin antidiabetic drugs: Secondary | ICD-10-CM | POA: Diagnosis not present

## 2023-12-19 DIAGNOSIS — E782 Mixed hyperlipidemia: Secondary | ICD-10-CM

## 2023-12-19 LAB — POCT GLYCOSYLATED HEMOGLOBIN (HGB A1C): HbA1c, POC (controlled diabetic range): 5.9 % (ref 0.0–7.0)

## 2023-12-19 MED ORDER — ALENDRONATE SODIUM 70 MG PO TABS: 70.0000 mg | ORAL_TABLET | ORAL | 3 refills | Status: AC

## 2023-12-19 MED ORDER — TIRZEPATIDE 7.5 MG/0.5ML ~~LOC~~ SOAJ: 7.5000 mg | SUBCUTANEOUS | 1 refills | Status: DC

## 2023-12-19 NOTE — Patient Instructions (Signed)

## 2023-12-19 NOTE — Progress Notes (Signed)
 12/19/2023, 4:14 PM  Endocrinology follow-up note   Subjective:    [name], female    DOB: [birth year], PCP Tobie Suzzane POUR, MD   Past Medical History:  Diagnosis Date   Adrenal mass, left [facility])    Age-related osteoporosis without current pathological fracture 10/12/2023   Breast cancer (HCC)    Degenerative disc disease at L5-S1 level    Depression 07/15/2011   DM (diabetes mellitus) (HCC)    Type II controlled   Enlarged heart    Family history of breast cancer    Family history of colon cancer    Family history of pancreatic cancer    Family history of stomach cancer    Grave's disease    History of kidney stones    HTN (hypertension)    Hyperlipidemia    Hypothyroidism    Lymphocytosis    Nephrolithiasis    OSA on CPAP    Past Surgical History:  Procedure Laterality Date   ABDOMINAL HYSTERECTOMY     BREAST BIOPSY Left 01/06/2020   BREAST LUMPECTOMY WITH RADIOACTIVE SEED LOCALIZATION Left 02/19/2020   Procedure: LEFT BREAST LUMPECTOMY WITH RADIOACTIVE SEED LOCALIZATION;  Surgeon: Vernetta Berg, MD;  Location: [facility];  Service: General;  Laterality: Left;   COLONOSCOPY  2011   normal colonoscopy   COLONOSCOPY WITH PROPOFOL  N/A 04/16/2018   Procedure: COLONOSCOPY WITH PROPOFOL ;  Surgeon: Shaaron Lamar HERO, MD;  [facility];  Service: Endoscopy;  Laterality: N/A;  9:45am   COLONOSCOPY WITH PROPOFOL  N/A 01/26/2022   Procedure: COLONOSCOPY WITH PROPOFOL ;  Surgeon: Shaaron Lamar HERO, MD;  [facility];  Service: Endoscopy;  Laterality: N/A;  11:15 am   HERNIA REPAIR  2011   LITHOTRIPSY     POLYPECTOMY  04/16/2018   Procedure: POLYPECTOMY;  Surgeon: Shaaron Lamar HERO, MD;  [facility];  Service: Endoscopy;;  (colon)   POLYPECTOMY  01/26/2022   Procedure: POLYPECTOMY;  Surgeon: Shaaron Lamar HERO, MD;  [facility];  Service: Endoscopy;;    S/P Hysterectomy  10/1996   with bilat SOO seconardt to fibroids   thyroid  ablation  1986   Social History   Socioeconomic History   Marital status: Married    Spouse name: Nikitha Mode   Number of children: Not on file   Years of education: 12   Highest education level: Bachelor's degree (e.g., BA, AB, BS)  Occupational History   Not on file  Tobacco Use   Smoking status: Former    Current packs/day: 0.00    Average packs/day: 0.8 packs/day for 19.3 years (14.5 ttl pk-yrs)    Types: Cigarettes    Start date: 01/30/1969    Quit date: 05/23/1988    Years since quitting: 35.5    Passive exposure: Past   Smokeless tobacco: Never   Tobacco comments:    I have not smoked in over 34 years  Vaping Use   Vaping status: Never Used  Substance and Sexual Activity   Alcohol use: Hughes   Drug use: Hughes   Sexual activity: Yes    Partners: Male    Comment: Hysterectomy  Other Topics Concern   Not on file  Social  History Narrative   Not on file   Social Drivers of Health   Financial Resource Strain: Low Risk  (06/26/2023)   Overall Financial Resource Strain (CARDIA)    Difficulty of Paying Living Expenses: Not very hard  Food Insecurity: Hughes Food Insecurity (06/26/2023)   Hunger Vital Sign    Worried About Running Out of Food in the Last Year: Never true    Ran Out of Food in the Last Year: Never true  Transportation Needs: Hughes Transportation Needs (06/26/2023)   PRAPARE - Administrator, Civil Service (Medical): Hughes    Lack of Transportation (Non-Medical): Hughes  Physical Activity: Sufficiently Active (06/26/2023)   Exercise Vital Sign    Days of Exercise per Week: 3 days    Minutes of Exercise per Session: 90 min  Stress: Stress Concern Present (01/02/2023)   Frances Hughes of Occupational Health - Occupational Stress Questionnaire    Feeling of Stress : Rather much  Social Connections: Socially Integrated (06/26/2023)   Social Connection and Isolation Panel    Frequency of  Communication with Friends and Family: More than three times a week    Frequency of Social Gatherings with Friends and Family: More than three times a week    Attends Religious Services: More than 4 times per year    Active Member of [employer] or Organizations: Yes    Attends Engineer, structural: More than 4 times per year    Marital Status: Married   Family History  Problem Relation Age of Onset   Colon cancer Mother        d. 49   Cancer Mother        metastisis sarcoma   Colon cancer Sister 74   Head & neck cancer Sister        roof of mouth   Colon polyps Sister    Diabetes Father    Hypertension Father    Cancer Father    Lung cancer Father        d. 71   Colon polyps Sister    Obesity Sister    Esophageal cancer Brother    Cancer Brother        cancer of eye   Aneurysm Maternal Aunt        brain   Cancer Maternal Uncle        NOS   COPD Maternal Uncle    Stomach cancer Maternal Grandfather 76   Heart attack Maternal Grandfather    Pancreatic cancer Paternal Grandmother        d. 65   Cancer Maternal Uncle        NOS - mother's pat 1/2 brother   Breast cancer Cousin        3 maternal cousins with breast cancer, some under 50, all different branches   Colon cancer Cousin        five mat first cousins, 4 were siblings   Cancer Cousin        2 mat 1/2 cousins with NOS cancer   Outpatient Encounter Medications as of 12/19/2023  Medication Sig   alendronate  (FOSAMAX ) 70 MG tablet Take 1 tablet (70 mg total) by mouth every 7 (seven) days. Take with a full glass of water  on an empty stomach.   tirzepatide  (MOUNJARO ) 7.5 MG/0.5ML Pen Inject 7.5 mg into the skin once a week.   anastrozole  (ARIMIDEX ) 1 MG tablet TAKE 1 TABLET(1 MG) BY MOUTH DAILY. START 06/08/2020   aspirin  81 MG EC tablet  Take 81 mg by mouth 3 (three) times a week. Swallow whole.   atorvastatin  (LIPITOR) 40 MG tablet TAKE 1 TABLET(40 MG) BY MOUTH DAILY   Blood Glucose Monitoring Suppl (BLOOD  GLUCOSE MONITOR SYSTEM) W/DEVICE KIT Please dispense based on patient and insurance preference. Use to monitor fasting blood sugar 1x daily. Dx: E11.9   cloNIDine  (CATAPRES ) 0.2 MG tablet TAKE 1 TABLET(0.2 MG) BY MOUTH TWICE DAILY   furosemide  (LASIX ) 20 MG tablet TAKE 1 TABLET(20 MG) BY MOUTH DAILY AS NEEDED   Glucose Blood (BLOOD GLUCOSE TEST STRIPS) STRP Please dispense based on patient and insurance preference. Use to monitor fasting blood sugar 1x daily. Dx: E11.9   hydrochlorothiazide  (HYDRODIURIL ) 25 MG tablet Take 1 tablet (25 mg total) by mouth daily with breakfast.   hydrocortisone  (ANUSOL -HC) 25 MG suppository Place 1 suppository (25 mg total) rectally 2 (two) times daily.   Lancets 28G MISC Please dispense based on patient and insurance preference. Use to monitor fasting blood sugar 1x daily. Dx: E11.9   levothyroxine  (SYNTHROID ) 100 MCG tablet Take 1 tablet (100 mcg total) by mouth daily before breakfast.   losartan  (COZAAR ) 100 MG tablet TAKE 1 TABLET(100 MG) BY MOUTH DAILY   Multiple Vitamin (MULTIVITAMIN WITH MINERALS) TABS tablet Take 1 tablet by mouth daily.   polyethylene glycol powder (MIRALAX ) 17 GM/SCOOP powder Take 17 g by mouth 2 (two) times daily as needed for moderate constipation.   potassium chloride  SA (KLOR-CON  M) 20 MEQ tablet Take 1 tablet (20 mEq total) by mouth 2 (two) times daily.   senna-docusate (SENOKOT-S) 8.6-50 MG tablet Take 2 tablets by mouth at bedtime as needed for mild constipation.   spironolactone  (ALDACTONE ) 50 MG tablet TAKE 1 TABLET(50 MG) BY MOUTH DAILY   [DISCONTINUED] metFORMIN  (GLUCOPHAGE ) 500 MG tablet Take 1 tablet (500 mg total) by mouth daily with breakfast.   [DISCONTINUED] MOUNJARO  5 MG/0.5ML Pen ADMINISTER 5 MG UNDER THE SKIN 1 TIME A WEEK   Hughes facility-administered encounter medications on file as of 12/19/2023.   ALLERGIES: Hughes Known Allergies  VACCINATION STATUS: Immunization History  Administered Date(s) Administered   Fluad  Quad(high Dose 65+) 02/08/2019, 03/05/2020, 02/09/2022   Fluad Trivalent(High Dose 65+) 02/13/2023   Influenza-Unspecified 03/21/2011, 03/20/2015   Moderna Sars-Covid-2 Vaccination 07/08/2019, 07/31/2019   PNEUMOCOCCAL CONJUGATE-20 09/29/2021   Pneumococcal Polysaccharide-23 04/13/2007, 03/05/2020   Tdap 09/14/2012    HPI BRITYN MASTROGIOVANNI is 68 y.o. female who returns for follow-up with previsit labs.  She was initially seen in consultation for  left adrenal adenoma requested by Tobie Suzzane POUR, MD. 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  She has a long and complicated medical history.  See notes from previous visits.     she has history of hypertension on multiple medications.  She is currently on clonidine  0.2 mg twice a day, losartan  100 mg once daily, spironolactone  50 mg once a day as well as hydrochlorothiazide  25 mg once a day.   Her blood pressure is controlled to target. Her previous workup with aldosterone, plasma renin activity were not conclusive for primary hyperaldosteronism.    She also has history of Graves' disease which required radioactive iodine thyroid  ablation in 2006.  She is currently on levothyroxine  100 mcg p.o. daily before breakfast.  Her previsit thyroid  function tests are consistent with appropriate replacement.    she reports compliance and consistency.  She continues to feel better, has Hughes new complaints today.  She  has type 2 diabetes being managed with Mounjaro  and metformin .  She has achieved some weight loss, tolerating Mounjaro  5 mg subcutaneously weekly.  Her point-of-care A1c is 5.9% today.  She is known to have left adrenal mass at least since September 2005.  She has a series of abdominal and thoracic imaging studies since then and it was documented that this lesion was stable and displays features consistent with benign adenoma.  During her last abdominal CT in 2016 it measured 3.5 cm (3.2  cm in 2007) and the lesion demonstrated washout characteristics consistent with benign adrenal adenoma with absolute washout of 93%.  The right adrenal glands were normal. Over the years, she underwent various endocrine work-ups for this adenoma and none of them were significant for functioning lesion. Most recently in October 2021 she underwent 24-hour urine collection for metanephrines which were normal.  Her free plasma metanephrines were normal.  Her potassium is lately stable with current potassium supplement 20 mEq twice a day.   She is not on regular diuresis with Lasix  anymore, she uses Lasix  20 mg p.o. as needed.  She was recently diagnosed with breast cancer s/p chemo/surgery.      She has sleep apnea on CPAP machine. She does not consume licorice, Hughes family history of premature coronary artery disease, CVA, adrenal, thyroid , parathyroid, pituitary dysfunctions.  Review of Systems  Minimally fluctuating body weight.  Objective:       12/19/2023   11:10 AM 10/12/2023   11:08 AM 10/11/2023   10:36 AM  Vitals with BMI  Height 5' 3 5' 3   Weight 212 lbs 13 oz 221 lbs 10 oz   BMI 37.71 39.26   Systolic 122 120 833  Diastolic 76 76 120  Pulse 56 68 106    BP 122/76   Pulse (!) 56   Ht 5' 3 (1.6 m)   Wt 212 lb 12.8 oz (96.5 kg)   BMI 37.70 kg/m   Wt Readings from Last 3 Encounters:  12/19/23 212 lb 12.8 oz (96.5 kg)  10/12/23 221 lb 9.6 oz (100.5 kg)  08/14/23 226 lb 6.4 oz (102.7 kg)    Physical Exam She has bilateral peripheral edema.  CMP ( most recent) CMP     Component Value Date/Time   NA 141 07/31/2023 1143   NA 143 10/27/2022 1043   K 3.9 07/31/2023 1143   CL 103 07/31/2023 1143   CO2 28 07/31/2023 1143   GLUCOSE 96 07/31/2023 1143   BUN 18 07/31/2023 1143   BUN 17 10/27/2022 1043   CREATININE 0.81 07/31/2023 1143   CREATININE 0.86 03/08/2022 1244   CREATININE 0.68 06/12/2020 0930   CALCIUM  9.6 07/31/2023 1143   PROT 7.8 07/31/2023 1143   PROT  6.9 10/27/2022 1043   ALBUMIN 3.7 07/31/2023 1143   ALBUMIN 3.9 10/27/2022 1043   AST 16 07/31/2023 1143   AST 12 (L) 03/08/2022 1244   ALT 11 07/31/2023 1143   ALT 9 03/08/2022 1244   ALKPHOS 75 07/31/2023 1143   BILITOT 0.5 07/31/2023 1143   BILITOT 0.3 10/27/2022 1043   BILITOT 0.4 03/08/2022 1244   GFRNONAA >60 07/31/2023 1143   GFRNONAA >60 03/08/2022 1244   GFRNONAA 94 11/07/2018 0927   GFRAA >60 02/17/2020 0930   GFRAA 109 11/07/2018 0927     Diabetic Labs (most recent): Lab Results  Component Value Date   HGBA1C 5.9 12/19/2023   HGBA1C 5.8 08/08/2023   HGBA1C 6.3 05/10/2023   MICROALBUR 5.3 09/09/2019  MICROALBUR 2.2 11/07/2018   MICROALBUR 0.3 04/03/2015     Lipid Panel ( most recent) Lipid Panel     Component Value Date/Time   CHOL 176 05/03/2023 1030   CHOL 165 10/27/2022 1043   TRIG 50 05/03/2023 1030   HDL 54 05/03/2023 1030   HDL 56 10/27/2022 1043   CHOLHDL 3.3 05/03/2023 1030   VLDL 10 05/03/2023 1030   LDLCALC 112 (H) 05/03/2023 1030   LDLCALC 96 10/27/2022 1043   LDLCALC 107 (H) 06/12/2020 0930   LDLDIRECT 110 (H) 01/21/2012 0950   LABVLDL 13 10/27/2022 1043     Recent Results (from the past 2160 hours)  HgB A1c     Status: None   Collection Time: 12/19/23 11:41 AM  Result Value Ref Range   Hemoglobin A1C     HbA1c POC (<> result, manual entry)     HbA1c, POC (prediabetic range)     HbA1c, POC (controlled diabetic range) 5.9 0.0 - 7.0 %     Assessment & Plan:   1. Adrenal adenoma- left -Her plasma free metanephrines were normal.  Review of all of her previous work-up points to a stable nonfunctioning left adrenal adenoma.  This adenoma is not contributing to her history of hypertension.   The adrenal lesion was documented to be stable since 2005.  she does not have paroxysmal symptoms to suggest pheochromocytoma.  Her recent work-up rules out  Cushing syndrome. Her recent work-up with aldosterone, plasma renin activity are not  conclusive for primary hyperaldosteronism.  She will not need to surgical intervention for the adenoma at this time.     2.  Essential hypertension - Her blood pressure is controlled on multiple medications, advised to continue spironolactone  50 mg once daily, hydrochlorothiazide  25 mg p.o. daily, losartan  100 mg p.o. daily at breakfast with his clonidine  0.2 mg twice daily.    3.  RAI induced hypothyroidism  Regarding her RAI induced hypothyroidism: Her previsit thyroid  function tests are consistent with appropriate replacement. She is advised to continue levothyroxine  100 mcg p.o. daily before breakfast     - We discussed about the correct intake of her thyroid  hormone, on empty stomach at fasting, with water , separated by at least 30 minutes from breakfast and other medications,  and separated by more than 4 hours from calcium , iron, multivitamins, acid reflux medications (PPIs). -Patient is made aware of the fact that thyroid  hormone replacement is needed for life, dose to be adjusted by periodic monitoring of thyroid  function tests.   4.  Type 2 diabetes: Controlled with point-of-care A1c of 5.9% improving from 6.3% during her last visit.   -She is responding to her Mounjaro , Hughes reported side effects.  She will benefit from a higher dose.  I discussed and increase her Mounjaro  to 7.5 mg subcutaneously weekly.  Side effects and precautions discussed with her.  At this time I advised her to hold metformin  for now.    - she acknowledges that there is a room for improvement in her food and drink choices. - Suggestion is made for her to avoid simple carbohydrates  from her diet including Cakes, Sweet Desserts, Ice Cream, Soda (diet and regular), Sweet Tea, Candies, Chips, Cookies, Store Bought Juices, Alcohol , Artificial Sweeteners,  Coffee Creamer, and Sugar-free Products, Lemonade. This will help patient to have more stable blood glucose profile and potentially avoid unintended weight  gain.   5) morbid obesity: Her engagement with lifestyle medicine is suboptimal.  She is responding to GLP-1  receptor agonists-see above.     6)  hyperlipidemia:  Not controlled with LDL 112.  He is advised to continue Lipitor 40 mg p.o. nightly.  Side effects and precautions discussed with her.  She will have fasting lipid panel before her next visit.   7) osteoporosis-new diagnosis. - Patient denies any history of fragility fractures.  Her T-score was measured only at hip to be -2.5.  Her spine was excluded due to degenerative joint disease.  She was started on vitamin D  by her PMD.  She will also benefit from early intervention with anti-osteoporosis treatment.  Options were discussed with her.  She is initiated on Fosamax  70 mg p.o. weekly and she agrees.  Side effects and precautions discussed with her.      - she is advised to seek and establish a primary care provider in town, Dr. Tobie was suggested for her primary care needs.   I spent  41  minutes in the care of the patient today including review of labs from CMP, Lipids, Thyroid  Function, Hematology (current and previous including abstractions from other facilities); face-to-face time discussing  her blood glucose readings/logs, discussing hypoglycemia and hyperglycemia episodes and symptoms, medications doses, her options of short and long term treatment based on the latest standards of care / guidelines;  discussion about incorporating lifestyle medicine;  and documenting the encounter. Risk reduction counseling performed per USPSTF guidelines to reduce  obesity and cardiovascular risk factors.     Please refer to Patient Instructions for Blood Glucose Monitoring and Insulin /Medications Dosing Guide  in media tab for additional information. Please  also refer to  Patient Self Inventory in the Media  tab for reviewed elements of pertinent patient history.  Frances Hughes participated in the discussions, expressed understanding,  and voiced agreement with the above plans.  All questions were answered to her satisfaction. she is encouraged to contact clinic should she have any questions or concerns prior to her return visit.    Follow up plan: Return in about 6 months (around 06/20/2024) for Fasting Labs  in AM B4 8, A1c -NV.   Ranny Earl, MD [facility] [address] [place], [place] [address] Phone: [phone]  Fax: [phone]     12/19/2023, 4:14 PM  This note was partially dictated with voice recognition software. Similar sounding words can be transcribed inadequately or may not  be corrected upon review.

## 2024-01-08 ENCOUNTER — Ambulatory Visit (INDEPENDENT_AMBULATORY_CARE_PROVIDER_SITE_OTHER): Payer: Medicare Other

## 2024-01-08 VITALS — Ht 63.0 in | Wt 219.0 lb

## 2024-01-08 DIAGNOSIS — Z0001 Encounter for general adult medical examination with abnormal findings: Secondary | ICD-10-CM

## 2024-01-08 DIAGNOSIS — Z0000 Encounter for general adult medical examination without abnormal findings: Secondary | ICD-10-CM

## 2024-01-08 DIAGNOSIS — I10 Essential (primary) hypertension: Secondary | ICD-10-CM

## 2024-01-08 DIAGNOSIS — E119 Type 2 diabetes mellitus without complications: Secondary | ICD-10-CM

## 2024-01-08 DIAGNOSIS — Z1231 Encounter for screening mammogram for malignant neoplasm of breast: Secondary | ICD-10-CM

## 2024-01-08 MED ORDER — BLOOD GLUCOSE TEST VI STRP: 1.0000 | ORAL_STRIP | Freq: Three times a day (TID) | 0 refills | Status: AC

## 2024-01-08 MED ORDER — BLOOD GLUCOSE MONITORING SUPPL DEVI: 1.0000 | Freq: Three times a day (TID) | 0 refills | Status: AC

## 2024-01-08 MED ORDER — LANCET DEVICE MISC
1.0000 | Freq: Three times a day (TID) | 0 refills | Status: AC
Start: 2024-01-08 — End: 2024-02-07

## 2024-01-08 MED ORDER — LANCETS MISC. MISC: 1.0000 | Freq: Three times a day (TID) | 0 refills | Status: AC

## 2024-01-08 MED ORDER — BLOOD PRESSURE MONITOR 3 DEVI: 0 refills | Status: AC

## 2024-01-08 NOTE — Patient Instructions (Addendum)
 Ms. Frances Hughes , Thank you for taking time out of your busy schedule to complete your Annual Wellness Visit with me. I enjoyed our conversation and look forward to speaking with you again next year. I, as well as your care team,  appreciate your ongoing commitment to your health goals. Please review the following plan we discussed and let me know if I can assist you in the future. Your Game plan/ To Do List    Referrals: If you haven't heard from the office you've been referred to, please reach out to them at the phone provided.   Mammogram Order: Please call and make your appt or schedule it through my chart [facility] [address] [place], [place] [address] [phone] Prescriptions for a home blood pressure monitor and home glucose meter have been sent to your local pharmacy.  Please see the letter of dental resources under the LETTER tab in your mychart. I will also have the front office mail you the letter.   Follow up Visits: We will see or speak with you next year for your Next Medicare AWV with our clinical staff  January 08, 2025 at 9:20 video visit.   Clinician Recommendations:  Aim for 30 minutes of exercise or brisk walking, 6-8 glasses of water , and 5 servings of fruits and vegetables each day.    Wishing you many blessings and good health during the next year until our next visit.  -Bion Todorov   This is a list of the screenings recommended for you:  Health Maintenance  Topic Date Due   Zoster (Shingles) Vaccine (1 of 2) Never done   COVID-19 Vaccine (3 - Moderna risk series) 08/28/2019   DTaP/Tdap/Td vaccine (2 - Td or Tdap) 09/15/2022   Eye exam for diabetics  12/09/2022   Flu Shot  12/22/2023   Yearly kidney health urinalysis for diabetes  02/13/2024   Mammogram  03/24/2024   Hemoglobin A1C  06/20/2024   Yearly kidney function blood test for diabetes  07/30/2024   Complete foot exam   08/13/2024   Medicare Annual Wellness Visit  01/07/2025    DEXA scan (bone density measurement)  08/27/2025   Colon Cancer Screening  01/27/2027   Pneumococcal Vaccine for age over 35  Completed   Hepatitis C Screening  Completed   HPV Vaccine  Aged Out   Meningitis B Vaccine  Aged Out   Pneumococcal Vaccine  Discontinued    Advanced directives: (Declined) Advance directive discussed with you today. Even though you declined this today, please call our office should you change your mind, and we can give you the proper paperwork for you to fill out. Advance Care Planning is important because it:  [x]  Makes sure you receive the medical care that is consistent with your values, goals, and preferences  [x]  It provides guidance to your family and loved ones and reduces their decisional burden about whether or not they are making the right decisions based on your wishes.  Follow the link provided in your after visit summary or read over the paperwork we have mailed to you to help you started getting your Advance Directives in place. If you need assistance in completing these, please reach out to us  so that we can help you!  See attachments for Preventive Care and Fall Prevention Tips.

## 2024-01-08 NOTE — Progress Notes (Signed)
 Subjective:   Frances Hughes is a 68 y.o. who presents for a Medicare Wellness preventive visit.  As a reminder, Annual Wellness Visits don't include a physical exam, and some assessments may be limited, especially if this visit is performed virtually. We may recommend an in-person follow-up visit with your provider if needed.  Visit Complete: Virtual I connected with  Frances Hughes No on 01/08/24 by a audio enabled telemedicine application and verified that I am speaking with the correct person using two identifiers.  Patient Location: Home  Provider Location: Home Office  I discussed the limitations of evaluation and management by telemedicine. The patient expressed understanding and agreed to proceed.  Vital Signs: Because this visit was a virtual/telehealth visit, some criteria may be missing or patient reported. Any vitals not documented were not able to be obtained and vitals that have been documented are patient reported.  VideoDeclined- This patient declined Librarian, academic. Therefore the visit was completed with audio only.  Persons Participating in Visit: Patient.  AWV Questionnaire: No: Patient Medicare AWV questionnaire was not completed prior to this visit.  Cardiac Risk Factors include: advanced age (>33men, >31 women);diabetes mellitus;hypertension;obesity (BMI >30kg/m2)     Objective:    Today's Vitals   01/08/24 0917  Weight: 219 lb (99.3 kg)  Height: 5' 3 (1.6 m)   Body mass index is 38.79 kg/m.     01/08/2024    9:27 AM 01/08/2024    9:16 AM 01/04/2023   11:15 AM 03/01/2022    1:41 PM 01/26/2022    9:22 AM 01/25/2022   11:50 AM 12/08/2021   11:33 AM  Advanced Directives  Does Patient Have a Medical Advance Directive? No No No No Yes Yes Yes  Type of Careers adviser;Living will Healthcare Power of [place];Living will  Does patient want to make changes to medical advance directive?       No - Patient declined No - Patient declined  Copy of Healthcare Power of Attorney in Chart?     No - copy requested No - copy requested No - copy requested  Would patient like information on creating a medical advance directive? No - Patient declined No - Patient declined No - Patient declined No - Patient declined  No - Patient declined No - Patient declined    Current Medications (verified) Outpatient Encounter Medications as of 01/08/2024  Medication Sig   alendronate  (FOSAMAX ) 70 MG tablet Take 1 tablet (70 mg total) by mouth every 7 (seven) days. Take with a full glass of water  on an empty stomach.   anastrozole  (ARIMIDEX ) 1 MG tablet TAKE 1 TABLET(1 MG) BY MOUTH DAILY. START 06/08/2020   aspirin  81 MG EC tablet Take 81 mg by mouth 3 (three) times a week. Swallow whole.   atorvastatin  (LIPITOR) 40 MG tablet TAKE 1 TABLET(40 MG) BY MOUTH DAILY   Blood Glucose Monitoring Suppl (BLOOD GLUCOSE MONITOR SYSTEM) W/DEVICE KIT Please dispense based on patient and insurance preference. Use to monitor fasting blood sugar 1x daily. Dx: E11.9   Blood Glucose Monitoring Suppl DEVI 1 each by Does not apply route in the morning, at noon, and at bedtime. May substitute to any manufacturer covered by patient's insurance.   Blood Pressure Monitoring (BLOOD PRESSURE MONITOR 3) DEVI Check blood pressure once daily 1 hour after taking blood pressure medication   cloNIDine  (CATAPRES ) 0.2 MG tablet TAKE 1 TABLET(0.2 MG) BY MOUTH TWICE DAILY  furosemide  (LASIX ) 20 MG tablet TAKE 1 TABLET(20 MG) BY MOUTH DAILY AS NEEDED   Glucose Blood (BLOOD GLUCOSE TEST STRIPS) STRP 1 each by In Vitro route in the morning, at noon, and at bedtime. May substitute to any manufacturer covered by patient's insurance.   hydrochlorothiazide  (HYDRODIURIL ) 25 MG tablet Take 1 tablet (25 mg total) by mouth daily with breakfast.   hydrocortisone  (ANUSOL -HC) 25 MG suppository Place 1 suppository (25 mg total) rectally 2 (two) times daily.    Lancet Device MISC 1 each by Does not apply route in the morning, at noon, and at bedtime. May substitute to any manufacturer covered by patient's insurance.   Lancets 28G MISC Please dispense based on patient and insurance preference. Use to monitor fasting blood sugar 1x daily. Dx: E11.9   Lancets Misc. MISC 1 each by Does not apply route in the morning, at noon, and at bedtime. May substitute to any manufacturer covered by patient's insurance.   levothyroxine  (SYNTHROID ) 100 MCG tablet Take 1 tablet (100 mcg total) by mouth daily before breakfast.   losartan  (COZAAR ) 100 MG tablet TAKE 1 TABLET(100 MG) BY MOUTH DAILY   Multiple Vitamin (MULTIVITAMIN WITH MINERALS) TABS tablet Take 1 tablet by mouth daily.   polyethylene glycol powder (MIRALAX ) 17 GM/SCOOP powder Take 17 g by mouth 2 (two) times daily as needed for moderate constipation.   potassium chloride  SA (KLOR-CON  M) 20 MEQ tablet Take 1 tablet (20 mEq total) by mouth 2 (two) times daily.   senna-docusate (SENOKOT-S) 8.6-50 MG tablet Take 2 tablets by mouth at bedtime as needed for mild constipation.   spironolactone  (ALDACTONE ) 50 MG tablet TAKE 1 TABLET(50 MG) BY MOUTH DAILY   tirzepatide  (MOUNJARO ) 7.5 MG/0.5ML Pen Inject 7.5 mg into the skin once a week.   [DISCONTINUED] Glucose Blood (BLOOD GLUCOSE TEST STRIPS) STRP Please dispense based on patient and insurance preference. Use to monitor fasting blood sugar 1x daily. Dx: E11.9   No facility-administered encounter medications on file as of 01/08/2024.    Allergies (verified) Patient has no known allergies.   History: Past Medical History:  Diagnosis Date   Adrenal mass, left (HCC)    Age-related osteoporosis without current pathological fracture 10/12/2023   Arthritis    Breast cancer (HCC)    Degenerative disc disease at L5-S1 level    Depression 07/15/2011   DM (diabetes mellitus) (HCC)    Type II controlled   Enlarged heart    Family history of breast cancer    Family  history of colon cancer    Family history of pancreatic cancer    Family history of stomach cancer    Grave's disease    History of kidney stones    HTN (hypertension)    Hyperlipidemia    Hypothyroidism    Lymphocytosis    Nephrolithiasis    OSA on CPAP    Past Surgical History:  Procedure Laterality Date   ABDOMINAL HYSTERECTOMY     BREAST BIOPSY Left 01/06/2020   BREAST LUMPECTOMY WITH RADIOACTIVE SEED LOCALIZATION Left 02/19/2020   Procedure: LEFT BREAST LUMPECTOMY WITH RADIOACTIVE SEED LOCALIZATION;  Surgeon: Vernetta Berg, MD;  Location: [facility];  Service: General;  Laterality: Left;   COLONOSCOPY  2011   normal colonoscopy   COLONOSCOPY WITH PROPOFOL  N/A 04/16/2018   Procedure: COLONOSCOPY WITH PROPOFOL ;  Surgeon: Shaaron Lamar HERO, MD;  [facility];  Service: Endoscopy;  Laterality: N/A;  9:45am   COLONOSCOPY WITH PROPOFOL  N/A 01/26/2022   Procedure:  COLONOSCOPY WITH PROPOFOL ;  Surgeon: Shaaron Lamar HERO, MD;  Location: [facility];  Service: Endoscopy;  Laterality: N/A;  11:15 am   HERNIA REPAIR  2011   LITHOTRIPSY     POLYPECTOMY  04/16/2018   Procedure: POLYPECTOMY;  Surgeon: Shaaron Lamar HERO, MD;  [facility];  Service: Endoscopy;;  (colon)   POLYPECTOMY  01/26/2022   Procedure: POLYPECTOMY;  Surgeon: Shaaron Lamar HERO, MD;  Location: [facility];  Service: Endoscopy;;   S/P Hysterectomy  10/1996   with bilat SOO seconardt to fibroids   thyroid  ablation  1986   Family History  Problem Relation Age of Onset   Colon cancer Mother        d. 21   Cancer Mother        metastisis sarcoma   Colon cancer Sister 71   COPD Sister    Head & neck cancer Sister        roof of mouth   Colon polyps Sister    Diabetes Father    Hypertension Father    Cancer Father    Lung cancer Father        d. 14   Colon polyps Sister    Obesity Sister    Cancer Sister    Esophageal cancer Brother    Cancer Brother        cancer of eye    Aneurysm Maternal Aunt        brain   Cancer Maternal Uncle        NOS   COPD Maternal Uncle    Stomach cancer Maternal Grandfather 76   Heart attack Maternal Grandfather    Pancreatic cancer Paternal Grandmother        d. 27   Cancer Maternal Uncle        NOS - mother's pat 1/2 brother   Breast cancer Cousin        3 maternal cousins with breast cancer, some under 50, all different branches   Colon cancer Cousin        five mat first cousins, 4 were siblings   Cancer Cousin        2 mat 1/2 cousins with NOS cancer   Cancer Brother    Social History   Socioeconomic History   Marital status: Married    Spouse name: Jacqeline Broers   Number of children: Not on file   Years of education: 12   Highest education level: Bachelor's degree (e.g., BA, AB, BS)  Occupational History   Not on file  Tobacco Use   Smoking status: Former    Current packs/day: 0.00    Average packs/day: 0.8 packs/day for 19.3 years (14.5 ttl pk-yrs)    Types: Cigarettes    Start date: 01/30/1969    Quit date: 05/23/1988    Years since quitting: 35.6    Passive exposure: Past   Smokeless tobacco: Never   Tobacco comments:    I have not smoked in over 34 years  Vaping Use   Vaping status: Never Used  Substance and Sexual Activity   Alcohol use: No   Drug use: No   Sexual activity: Yes    Partners: Male    Comment: Hysterectomy  Other Topics Concern   Not on file  Social History Narrative   Not on file   Social Drivers of Health   Financial Resource Strain: Low Risk  (01/08/2024)   Overall Financial Resource Strain (CARDIA)    Difficulty of Paying Living  Expenses: Not hard at all  Food Insecurity: No Food Insecurity (01/08/2024)   Hunger Vital Sign    Worried About Running Out of Food in the Last Year: Never true    Ran Out of Food in the Last Year: Never true  Transportation Needs: No Transportation Needs (01/08/2024)   PRAPARE - Administrator, Civil Service (Medical): No    Lack  of Transportation (Non-Medical): No  Physical Activity: Sufficiently Active (01/08/2024)   Exercise Vital Sign    Days of Exercise per Week: 3 days    Minutes of Exercise per Session: 90 min  Stress: No Stress Concern Present (01/08/2024)   [employer] of Occupational Health - Occupational Stress Questionnaire    Feeling of Stress: Not at all  Social Connections: Socially Integrated (01/08/2024)   Social Connection and Isolation Panel    Frequency of Communication with Friends and Family: More than three times a week    Frequency of Social Gatherings with Friends and Family: More than three times a week    Attends Religious Services: More than 4 times per year    Active Member of [employer] or Organizations: Yes    Attends Engineer, structural: More than 4 times per year    Marital Status: Married    Tobacco Counseling Counseling given: Yes Tobacco comments: I have not smoked in over 34 years    Clinical Intake:  Pre-visit preparation completed: Yes  Pain : No/denies pain     BMI - recorded: 38.79 Nutritional Status: BMI > 30  Obese Diabetes: Yes CBG done?: No (telehealth visit.) Did pt. bring in CBG monitor from home?: No  Lab Results  Component Value Date   HGBA1C 5.9 12/19/2023   HGBA1C 5.8 08/08/2023   HGBA1C 6.3 05/10/2023     How often do you need to have someone help you when you read instructions, pamphlets, or other written materials from your doctor or pharmacy?: 1 - Never  Interpreter Needed?: No  Information entered by :: [facility]   Activities of Daily Living     01/08/2024    9:39 AM  In your present state of health, do you have any difficulty performing the following activities:  Hearing? 0  Vision? 0  Difficulty concentrating or making decisions? 0  Walking or climbing stairs? 0  Dressing or bathing? 0  Doing errands, shopping? 0  Preparing Food and eating ? N  Using the Toilet? N  In the past six months, have you accidently  leaked urine? N  Do you have problems with loss of bowel control? N  Managing your Medications? N  Managing your Finances? N  Housekeeping or managing your Housekeeping? N    Patient Care Team: Tobie Suzzane POUR, MD as PCP - General (Internal Medicine) Shaaron, Lamar HERO, MD as Consulting Physician (Gastroenterology) Glean Stephane BROCKS, RN (Inactive) as Oncology Nurse Navigator Tyree Nanetta SAILOR, RN as Oncology Nurse Navigator Dewey Rush, MD as Consulting Physician (Radiation Oncology) Loretha Ash, MD as Consulting Physician (Hematology and Oncology) Patty, A. Robynn, MD (Ophthalmology) Alvan Dorn FALCON, MD as Consulting Physician (Cardiology)  I have updated your Care Teams any recent Medical Services you may have received from other providers in the past year.     Assessment:   This is a routine wellness examination for [facility].  Hearing/Vision screen Hearing Screening - Comments:: Patient denies any hearing difficulties.   Vision Screening - Comments:: Wears rx glasses - up to date with routine eye exams  with  [facility]   Goals Addressed               This Visit's Progress     Effective Oral Hygiene Routine (pt-stated)        I want to have some dental work done.       Patient Stated (pt-stated)        Get healthier and lose a little weight.       Depression Screen     01/08/2024    9:40 AM 10/12/2023   11:15 AM 08/14/2023   10:46 AM 08/14/2023   10:40 AM 06/26/2023    1:51 PM 02/13/2023   10:14 AM 01/04/2023   11:19 AM  PHQ 2/9 Scores  PHQ - 2 Score 1 2 2  0 4 2 2   PHQ- 9 Score 1 4 4  0 14 4 4     Fall Risk     01/08/2024    9:38 AM 08/14/2023   10:40 AM 06/26/2023    1:51 PM 02/13/2023   10:14 AM 01/02/2023    3:48 PM  Fall Risk   Falls in the past year? 0 0 0 0 0  Number falls in past yr: 0 0 0 0 0  Injury with Fall? 0 0 0 0 0  Risk for fall due to : No Fall Risks No Fall Risks No Fall Risks  No Fall Risks  Follow up Falls evaluation  completed;Education provided;Falls prevention discussed Falls evaluation completed Falls evaluation completed  Falls prevention discussed    MEDICARE RISK AT HOME:  Medicare Risk at Home Any stairs in or around the home?: Yes If so, are there any without handrails?: No Home free of loose throw rugs in walkways, pet beds, electrical cords, etc?: Yes Adequate lighting in your home to reduce risk of falls?: Yes Life alert?: No Use of a cane, walker or w/c?: No Grab bars in the bathroom?: No Shower chair or bench in shower?: Yes Elevated toilet seat or a handicapped toilet?: Yes  TIMED UP AND GO:  Was the test performed?  No  Cognitive Function: 6CIT completed    12/08/2021   11:34 AM  MMSE - Mini Mental State Exam  Not completed: Unable to complete        01/08/2024    9:39 AM 01/04/2023   11:18 AM 12/08/2021   11:34 AM  6CIT Screen  What Year? 0 points 0 points 0 points  What month? 0 points 0 points 0 points  What time? 0 points 0 points 0 points  Count back from 20 0 points 0 points 0 points  Months in reverse 0 points 0 points 0 points  Repeat phrase 0 points 0 points 0 points  Total Score 0 points 0 points 0 points    Immunizations Immunization History  Administered Date(s) Administered   Fluad Quad(high Dose 65+) 02/08/2019, 03/05/2020, 02/09/2022   Fluad Trivalent(High Dose 65+) 02/13/2023   Influenza-Unspecified 03/21/2011, 03/20/2015   Moderna Sars-Covid-2 Vaccination 07/08/2019, 07/31/2019   PNEUMOCOCCAL CONJUGATE-20 09/29/2021   Pneumococcal Polysaccharide-23 04/13/2007, 03/05/2020   Tdap 09/14/2012    Screening Tests Health Maintenance  Topic Date Due   Zoster Vaccines- Shingrix (1 of 2) Never done   COVID-19 Vaccine (3 - Moderna risk series) 08/28/2019   DTaP/Tdap/Td (2 - Td or Tdap) 09/15/2022   OPHTHALMOLOGY EXAM  12/09/2022   INFLUENZA VACCINE  12/22/2023   Diabetic kidney evaluation - Urine ACR  02/13/2024   MAMMOGRAM  03/24/2024   HEMOGLOBIN  A1C  06/20/2024   Diabetic kidney evaluation - eGFR measurement  07/30/2024   FOOT EXAM  08/13/2024   Medicare Annual Wellness (AWV)  01/07/2025   DEXA SCAN  08/27/2025   Colonoscopy  01/27/2027   Pneumococcal Vaccine: 50+ Years  Completed   Hepatitis C Screening  Completed   HPV VACCINES  Aged Out   Meningococcal B Vaccine  Aged Out   Pneumococcal Vaccine  Discontinued    Health Maintenance  Health Maintenance Due  Topic Date Due   Zoster Vaccines- Shingrix (1 of 2) Never done   COVID-19 Vaccine (3 - Moderna risk series) 08/28/2019   DTaP/Tdap/Td (2 - Td or Tdap) 09/15/2022   OPHTHALMOLOGY EXAM  12/09/2022   INFLUENZA VACCINE  12/22/2023   Health Maintenance Items Addressed: Mammogram ordered, Labs Ordered: Urine acr  Additional Screening:  Vision Screening: Recommended annual ophthalmology exams for early detection of glaucoma and other disorders of the eye. Would you like a referral to an eye doctor? No    Dental Screening: Recommended annual dental exams for proper oral hygiene  Community Resource Referral / Chronic Care Management: CRR required this visit?  No   CCM required this visit?  No   Plan:    I have personally reviewed and noted the following in the patient's chart:   Medical and social history Use of alcohol, tobacco or illicit drugs  Current medications and supplements including opioid prescriptions. Patient is not currently taking opioid prescriptions. Functional ability and status Nutritional status Physical activity Advanced directives List of other physicians Hospitalizations, surgeries, and ER visits in previous 12 months Vitals Screenings to include cognitive, depression, and falls Referrals and appointments  In addition, I have reviewed and discussed with patient certain preventive protocols, quality metrics, and best practice recommendations. A written personalized care plan for preventive services as well as general preventive health  recommendations were provided to patient.   Akiva Brassfield, CMA   01/08/2024   After Visit Summary: (MyChart) Due to this being a telephonic visit, the after visit summary with patients personalized plan was offered to patient via MyChart   Notes: Nothing significant to report at this time.

## 2024-01-18 ENCOUNTER — Other Ambulatory Visit: Payer: Self-pay | Admitting: "Endocrinology

## 2024-01-26 ENCOUNTER — Other Ambulatory Visit: Payer: Self-pay | Admitting: "Endocrinology

## 2024-01-30 ENCOUNTER — Other Ambulatory Visit: Payer: Self-pay | Admitting: "Endocrinology

## 2024-01-31 ENCOUNTER — Other Ambulatory Visit: Payer: Self-pay | Admitting: "Endocrinology

## 2024-01-31 ENCOUNTER — Other Ambulatory Visit: Payer: Self-pay | Admitting: Internal Medicine

## 2024-01-31 DIAGNOSIS — E782 Mixed hyperlipidemia: Secondary | ICD-10-CM

## 2024-01-31 DIAGNOSIS — I10 Essential (primary) hypertension: Secondary | ICD-10-CM

## 2024-02-16 ENCOUNTER — Encounter: Admitting: Internal Medicine

## 2024-02-28 ENCOUNTER — Ambulatory Visit: Admitting: Internal Medicine

## 2024-02-28 ENCOUNTER — Encounter: Payer: Self-pay | Admitting: Internal Medicine

## 2024-02-28 VITALS — BP 135/83 | HR 79 | Ht 63.0 in | Wt 202.4 lb

## 2024-02-28 DIAGNOSIS — I10 Essential (primary) hypertension: Secondary | ICD-10-CM | POA: Diagnosis not present

## 2024-02-28 DIAGNOSIS — E890 Postprocedural hypothyroidism: Secondary | ICD-10-CM | POA: Diagnosis not present

## 2024-02-28 DIAGNOSIS — D0512 Intraductal carcinoma in situ of left breast: Secondary | ICD-10-CM

## 2024-02-28 DIAGNOSIS — L858 Other specified epidermal thickening: Secondary | ICD-10-CM | POA: Diagnosis not present

## 2024-02-28 DIAGNOSIS — K5904 Chronic idiopathic constipation: Secondary | ICD-10-CM | POA: Diagnosis not present

## 2024-02-28 DIAGNOSIS — Z0001 Encounter for general adult medical examination with abnormal findings: Secondary | ICD-10-CM

## 2024-02-28 DIAGNOSIS — Z23 Encounter for immunization: Secondary | ICD-10-CM | POA: Diagnosis not present

## 2024-02-28 DIAGNOSIS — E1169 Type 2 diabetes mellitus with other specified complication: Secondary | ICD-10-CM

## 2024-02-28 DIAGNOSIS — M816 Localized osteoporosis [Lequesne]: Secondary | ICD-10-CM | POA: Diagnosis not present

## 2024-02-28 DIAGNOSIS — E782 Mixed hyperlipidemia: Secondary | ICD-10-CM

## 2024-02-28 DIAGNOSIS — G4733 Obstructive sleep apnea (adult) (pediatric): Secondary | ICD-10-CM | POA: Diagnosis not present

## 2024-02-28 MED ORDER — CLONIDINE HCL 0.2 MG PO TABS: 0.2000 mg | ORAL_TABLET | Freq: Every evening | ORAL | 3 refills | Status: AC

## 2024-02-28 NOTE — Progress Notes (Signed)
 Established Patient Office Visit  Subjective:  [name], female    DOB: Jul 18, 1955  Age: 68 y.o. MRN: [id number]  CC:  Chief Complaint  Patient presents with   Annual Exam    Cpe    Skin Problem    Has concerns about her back has itchiness. Ongoing for 2 months.     HPI Frances Hughes is a 68 y.o. female with past medical history of type II DM, HTN, HLD, peripheral neuropathy, adrenal adenoma, breast cancer, hypothyroidism and morbid obesity who presents for f/u of her chronic medical conditions.  HTN: Her BP is well controlled now.  She has been taking losartan  100 mg QD, spironolactone  50 mg QD and clonidine  0.2 mg QD currently. She denies any headache, chest pain, dyspnea or palpitations. She has had intermittent leg swelling in the past. She denies any orthopnea or PND currently.  Type II DM: She takes Mounjaro  7.5 mg QW. Her HbA1c was 5.9 in 07/25.  Denies polyuria or polyphagia currently.  Followed by Dr. Lenis.  Hypothyroidism: Her dose of levothyroxine  was decreased to 100 mcg due to borderline elevated free T4.  Denies any recent change in weight or appetite.  Denies any tremors or palpitations.  She has been trying to walk/exercise regularly, but has been difficult due to her leg pain from neuropathy.  She has lost 24 lbs since the last visit.  She reports itching on the mid back area for the last 2 months.  Denies noticing any rash.   Past Medical History:  Diagnosis Date   Adrenal mass, left    Age-related osteoporosis without current pathological fracture 10/12/2023   Arthritis    Breast cancer (HCC)    Degenerative disc disease at L5-S1 level    Depression 07/15/2011   DM (diabetes mellitus) (HCC)    Type II controlled   Enlarged heart    Family history of breast cancer    Family history of colon cancer    Family history of pancreatic cancer    Family history of stomach cancer    Grave's disease    History of kidney stones    HTN  (hypertension)    Hyperlipidemia    Hypothyroidism    Lymphocytosis    Nephrolithiasis    OSA on CPAP     Past Surgical History:  Procedure Laterality Date   ABDOMINAL HYSTERECTOMY     BREAST BIOPSY Left 01/06/2020   BREAST LUMPECTOMY WITH RADIOACTIVE SEED LOCALIZATION Left 02/19/2020   Procedure: LEFT BREAST LUMPECTOMY WITH RADIOACTIVE SEED LOCALIZATION;  Surgeon: Vernetta Berg, MD;  [facility]  [facility];  Service: General;  Laterality: Left;   COLONOSCOPY  2011   normal colonoscopy   COLONOSCOPY WITH PROPOFOL  N/A 04/16/2018   Procedure: COLONOSCOPY WITH PROPOFOL ;  Surgeon: Shaaron Lamar HERO, MD;  Location: [facility];  Service: Endoscopy;  Laterality: N/A;  9:45am   COLONOSCOPY WITH PROPOFOL  N/A 01/26/2022   Procedure: COLONOSCOPY WITH PROPOFOL ;  Surgeon: Shaaron Lamar HERO, MD;  Location: [facility];  Service: Endoscopy;  Laterality: N/A;  11:15 am   HERNIA REPAIR  2011   LITHOTRIPSY     POLYPECTOMY  04/16/2018   Procedure: POLYPECTOMY;  Surgeon: Shaaron Lamar HERO, MD;  Location: [facility];  Service: Endoscopy;;  (colon)   POLYPECTOMY  01/26/2022   Procedure: POLYPECTOMY;  Surgeon: Shaaron Lamar HERO, MD;  Location: [facility];  Service: Endoscopy;;   S/P Hysterectomy  10/1996   with bilat SOO  seconardt to fibroids   thyroid  ablation  1986    Family History  Problem Relation Age of Onset   Colon cancer Mother        d. 3   Cancer Mother        metastisis sarcoma   Colon cancer Sister 24   COPD Sister    Head & neck cancer Sister        roof of mouth   Colon polyps Sister    Diabetes Father    Hypertension Father    Cancer Father    Lung cancer Father        d. 61   Colon polyps Sister    Obesity Sister    Cancer Sister    Esophageal cancer Brother    Cancer Brother        cancer of eye   Aneurysm Maternal Aunt        brain   Cancer Maternal Uncle        NOS   COPD Maternal Uncle    Stomach cancer Maternal Grandfather 76   Heart  attack Maternal Grandfather    Pancreatic cancer Paternal Grandmother        d. 12   Cancer Maternal Uncle        NOS - mother's pat 1/2 brother   Breast cancer Cousin        3 maternal cousins with breast cancer, some under 50, all different branches   Colon cancer Cousin        five mat first cousins, 4 were siblings   Cancer Cousin        2 mat 1/2 cousins with NOS cancer   Cancer Brother     Social History   Socioeconomic History   Marital status: Married    Spouse name: Shalva Rozycki   Number of children: Not on file   Years of education: 12   Highest education level: Bachelor's degree (e.g., BA, AB, BS)  Occupational History   Not on file  Tobacco Use   Smoking status: Former    Current packs/day: 0.00    Average packs/day: 0.8 packs/day for 19.3 years (14.5 ttl pk-yrs)    Types: Cigarettes    Start date: 01/30/1969    Quit date: 05/23/1988    Years since quitting: 35.7    Passive exposure: Past   Smokeless tobacco: Never   Tobacco comments:    I have not smoked in over 34 years  Vaping Use   Vaping status: Never Used  Substance and Sexual Activity   Alcohol use: No   Drug use: No   Sexual activity: Yes    Partners: Male    Comment: Hysterectomy  Other Topics Concern   Not on file  Social History Narrative   Not on file   Social Drivers of Health   Financial Resource Strain: Low Risk  (02/26/2024)   Overall Financial Resource Strain (CARDIA)    Difficulty of Paying Living Expenses: Not very hard  Food Insecurity: No Food Insecurity (02/26/2024)   Hunger Vital Sign    Worried About Running Out of Food in the Last Year: Never true    Ran Out of Food in the Last Year: Never true  Transportation Needs: No Transportation Needs (02/26/2024)   PRAPARE - Administrator, Civil Service (Medical): No    Lack of Transportation (Non-Medical): No  Physical Activity: Sufficiently Active (02/26/2024)   Exercise Vital Sign    Days of Exercise per Week:  3  days    Minutes of Exercise per Session: 120 min  Stress: No Stress Concern Present (02/26/2024)   [employer] of Occupational Health - Occupational Stress Questionnaire    Feeling of Stress: Only a little  Social Connections: Socially Integrated (02/26/2024)   Social Connection and Isolation Panel    Frequency of Communication with Friends and Family: More than three times a week    Frequency of Social Gatherings with Friends and Family: More than three times a week    Attends Religious Services: More than 4 times per year    Active Member of [employer] or Organizations: Yes    Attends Engineer, structural: More than 4 times per year    Marital Status: Married  Catering manager Violence: Not At Risk (01/08/2024)   Humiliation, Afraid, Rape, and Kick questionnaire    Fear of Current or Ex-Partner: No    Emotionally Abused: No    Physically Abused: No    Sexually Abused: No    Outpatient Medications Prior to Visit  Medication Sig Dispense Refill   alendronate  (FOSAMAX ) 70 MG tablet Take 1 tablet (70 mg total) by mouth every 7 (seven) days. Take with a full glass of water  on an empty stomach. 12 tablet 3   anastrozole  (ARIMIDEX ) 1 MG tablet TAKE 1 TABLET(1 MG) BY MOUTH DAILY. START 06/08/2020 90 tablet 4   aspirin  81 MG EC tablet Take 81 mg by mouth 3 (three) times a week. Swallow whole.     atorvastatin  (LIPITOR) 40 MG tablet TAKE 1 TABLET(40 MG) BY MOUTH DAILY 90 tablet 0   Blood Glucose Monitoring Suppl (BLOOD GLUCOSE MONITOR SYSTEM) W/DEVICE KIT Please dispense based on patient and insurance preference. Use to monitor fasting blood sugar 1x daily. Dx: E11.9 1 each 0   Blood Glucose Monitoring Suppl DEVI 1 each by Does not apply route in the morning, at noon, and at bedtime. May substitute to any manufacturer covered by patient's insurance. 1 each 0   Blood Pressure Monitoring (BLOOD PRESSURE MONITOR 3) DEVI Check blood pressure once daily 1 hour after taking blood pressure  medication 1 each 0   furosemide  (LASIX ) 20 MG tablet TAKE 1 TABLET(20 MG) BY MOUTH DAILY AS NEEDED 90 tablet 0   hydrochlorothiazide  (HYDRODIURIL ) 25 MG tablet TAKE 1 TABLET(25 MG) BY MOUTH DAILY WITH BREAKFAST 90 tablet 0   hydrocortisone  (ANUSOL -HC) 25 MG suppository Place 1 suppository (25 mg total) rectally 2 (two) times daily. 24 suppository 0   Lancets 28G MISC Please dispense based on patient and insurance preference. Use to monitor fasting blood sugar 1x daily. Dx: E11.9 100 each 0   levothyroxine  (SYNTHROID ) 100 MCG tablet TAKE 1 TABLET(100 MCG) BY MOUTH DAILY BEFORE BREAKFAST 90 tablet 0   losartan  (COZAAR ) 100 MG tablet TAKE 1 TABLET(100 MG) BY MOUTH DAILY 90 tablet 1   Multiple Vitamin (MULTIVITAMIN WITH MINERALS) TABS tablet Take 1 tablet by mouth daily.     polyethylene glycol powder (MIRALAX ) 17 GM/SCOOP powder Take 17 g by mouth 2 (two) times daily as needed for moderate constipation. 255 g 0   potassium chloride  SA (KLOR-CON  M) 20 MEQ tablet TAKE 1 TABLET(20 MEQ) BY MOUTH TWICE DAILY 180 tablet 0   senna-docusate (SENOKOT-S) 8.6-50 MG tablet Take 2 tablets by mouth at bedtime as needed for mild constipation. 60 tablet 0   spironolactone  (ALDACTONE ) 50 MG tablet TAKE 1 TABLET(50 MG) BY MOUTH DAILY 90 tablet 1   tirzepatide  (MOUNJARO ) 7.5 MG/0.5ML Pen Inject 7.5  mg into the skin once a week. 6 mL 1   cloNIDine  (CATAPRES ) 0.2 MG tablet TAKE 1 TABLET(0.2 MG) BY MOUTH TWICE DAILY 180 tablet 1   No facility-administered medications prior to visit.    No Known Allergies  ROS Review of Systems  Constitutional:  Positive for fatigue. Negative for chills and fever.  HENT:  Negative for congestion, sinus pressure, sinus pain and sore throat.   Eyes:  Negative for pain and discharge.  Respiratory:  Negative for cough and shortness of breath.   Cardiovascular:  Negative for chest pain and palpitations.  Gastrointestinal:  Negative for abdominal pain, diarrhea, nausea and vomiting.   Endocrine: Negative for polydipsia and polyuria.  Genitourinary:  Negative for dysuria and hematuria.  Musculoskeletal:  Negative for neck pain and neck stiffness.  Skin:  Negative for rash.  Neurological:  Negative for dizziness and weakness.  Psychiatric/Behavioral:  Negative for agitation and behavioral problems.       Objective:    Physical Exam Vitals reviewed.  Constitutional:      General: She is not in acute distress.    Appearance: She is obese. She is not diaphoretic.  HENT:     Head: Normocephalic and atraumatic.     Nose: Nose normal.     Mouth/Throat:     Mouth: Mucous membranes are moist.  Eyes:     General: No scleral icterus.    Extraocular Movements: Extraocular movements intact.  Cardiovascular:     Rate and Rhythm: Normal rate and regular rhythm.     Heart sounds: Normal heart sounds. No murmur heard. Pulmonary:     Breath sounds: Normal breath sounds. No wheezing or rales.  Abdominal:     Palpations: Abdomen is soft.     Tenderness: There is no abdominal tenderness.  Musculoskeletal:     Cervical back: Neck supple. No tenderness.     Right lower leg: No edema.     Left lower leg: No edema.  Skin:    General: Skin is warm.     Findings: No rash.  Neurological:     General: No focal deficit present.     Mental Status: She is alert and oriented to person, place, and time.     Cranial Nerves: No cranial nerve deficit.     Sensory: No sensory deficit.     Motor: No weakness.  Psychiatric:        Mood and Affect: Mood normal.        Behavior: Behavior normal.     BP 135/83   Pulse 79   Ht 5' 3 (1.6 m)   Wt 202 lb 6.4 oz (91.8 kg)   SpO2 95%   BMI 35.85 kg/m  Wt Readings from Last 3 Encounters:  02/28/24 202 lb 6.4 oz (91.8 kg)  01/08/24 219 lb (99.3 kg)  12/19/23 212 lb 12.8 oz (96.5 kg)    Lab Results  Component Value Date   TSH 4.280 07/31/2023   Lab Results  Component Value Date   WBC 8.2 03/08/2022   HGB 12.0 03/08/2022    HCT 37.6 03/08/2022   MCV 83.4 03/08/2022   PLT 223 03/08/2022   Lab Results  Component Value Date   NA 141 07/31/2023   K 3.9 07/31/2023   CO2 28 07/31/2023   GLUCOSE 96 07/31/2023   BUN 18 07/31/2023   CREATININE 0.81 07/31/2023   BILITOT 0.5 07/31/2023   ALKPHOS 75 07/31/2023   AST 16 07/31/2023   ALT 11 07/31/2023  PROT 7.8 07/31/2023   ALBUMIN 3.7 07/31/2023   CALCIUM  9.6 07/31/2023   ANIONGAP 10 07/31/2023   EGFR 91 10/27/2022   Lab Results  Component Value Date   CHOL 176 05/03/2023   Lab Results  Component Value Date   HDL 54 05/03/2023   Lab Results  Component Value Date   LDLCALC 112 (H) 05/03/2023   Lab Results  Component Value Date   TRIG 50 05/03/2023   Lab Results  Component Value Date   CHOLHDL 3.3 05/03/2023   Lab Results  Component Value Date   HGBA1C 5.9 12/19/2023      Assessment & Plan:   Problem List Items Addressed This Visit       Cardiovascular and Mediastinum   Essential hypertension   BP Readings from Last 1 Encounters:  02/28/24 135/83   Well-controlled with losartan  100 mg QD, hydrochlorothiazide  25 mg once daily, Spironolactone  50 mg once daily, and clonidine  0.2 mg qHS Dizziness improved since stopping morning dose of clonidine  Counseled for compliance with the medications Advised DASH diet and moderate exercise/walking, at least 150 mins/week      Relevant Medications   cloNIDine  (CATAPRES ) 0.2 MG tablet   Other Relevant Orders   CMP14+EGFR   CBC with Differential/Platelet     Respiratory   OSA (obstructive sleep apnea)   Uses CPAP, advised to use it regularly On Mounjaro  for type II DM, can also be helpful for OSA        Digestive   Chronic idiopathic constipation   Advised to take Senokot-S for constipation MiraLAX  as needed for persistent constipation Advised to maintain at least 64 ounces of fluid intake in a day Increase portion of green vegetables in diet        Endocrine   Type 2 diabetes  mellitus with other specified complication (HCC)   Lab Results  Component Value Date   HGBA1C 5.9 12/19/2023   Well-controlled Associated with HTN and HLD On Mounjaro  7.5 mg qw Followed by Dr. Lenis Advised to follow diabetic diet On statin and ARB F/u CMP and lipid panel Diabetic eye exam: Advised to follow up with Ophthalmology for diabetic eye exam      Relevant Orders   Microalbumin / creatinine urine ratio   Hypothyroidism following radioiodine therapy   Lab Results  Component Value Date   TSH 4.280 07/31/2023   On levothyroxine  100 mcg QD Followed by Endocrinology        Musculoskeletal and Integument   Localized osteoporosis without current pathological fracture   DEXA scan reviewed On alendronate  now Continue vitamin D  supplement      Relevant Orders   VITAMIN D  25 Hydroxy (Vit-D Deficiency, Fractures)   Keratosis pilaris   Itching on mid back likely due to keratosis pilaris Advised to apply lotion or moisturizer for now Benadryl as needed for itching If persistent itching or new onset rash, will refer to dermatology        Other   Mixed hyperlipidemia (Chronic)   On Lipitor 40 mg QD Check lipid profile      Relevant Medications   cloNIDine  (CATAPRES ) 0.2 MG tablet   Other Relevant Orders   Lipid panel   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 Counseling done  re healthy lifestyle involving commitment  to 150 minutes exercise per week, heart healthy diet, and attaining healthy weight.The importance of adequate sleep also discussed. Changes in health habits are decided on by the patient with goals and time frames  set for achieving them. Immunization and cancer screening needs are specifically addressed at this visit.      Other  Visit Diagnoses       Encounter for immunization       Relevant Orders   Flu vaccine HIGH DOSE PF(Fluzone Trivalent) (Completed)        Meds ordered this encounter  Medications   cloNIDine  (CATAPRES ) 0.2 MG tablet    Sig: Take 1 tablet (0.2 mg total) by mouth every evening.    Dispense:  90 tablet    Refill:  3    Follow-up: Return in about 6 months (around 08/28/2024) for HTN and DM.    Suzzane MARLA Blanch, MD

## 2024-02-28 NOTE — Assessment & Plan Note (Signed)

## 2024-02-28 NOTE — Assessment & Plan Note (Signed)
 Lab Results  Component Value Date   TSH 4.280 07/31/2023   On levothyroxine  100 mcg QD Followed by Endocrinology

## 2024-02-28 NOTE — Assessment & Plan Note (Signed)
 Advised to take Senokot-S for constipation MiraLAX  as needed for persistent constipation Advised to maintain at least 64 ounces of fluid intake in a day Increase portion of green vegetables in diet

## 2024-02-28 NOTE — Assessment & Plan Note (Signed)
 DEXA scan reviewed On alendronate  now Continue vitamin D  supplement

## 2024-02-28 NOTE — Assessment & Plan Note (Signed)
 Uses CPAP, advised to use it regularly On Mounjaro  for type II DM, can also be helpful for OSA

## 2024-02-28 NOTE — Assessment & Plan Note (Addendum)
 Itching on mid back likely due to keratosis pilaris Advised to apply lotion or moisturizer for now Benadryl as needed for itching If persistent itching or new onset rash, will refer to dermatology

## 2024-02-28 NOTE — Patient Instructions (Addendum)
 Please apply lotion or moisturizer for back itching.  Please continue to take medications as prescribed.  Please continue to follow low carb diet and perform moderate exercise/walking at least 150 mins/week.  Please consider getting Shingrix and Tdap vaccine at local pharmacy.

## 2024-02-28 NOTE — Assessment & Plan Note (Addendum)
 Lab Results  Component Value Date   HGBA1C 5.9 12/19/2023   Well-controlled Associated with HTN and HLD On Mounjaro  7.5 mg qw Followed by Dr. Lenis Advised to follow diabetic diet On statin and ARB F/u CMP and lipid panel Diabetic eye exam: Advised to follow up with Ophthalmology for diabetic eye exam

## 2024-02-28 NOTE — Assessment & Plan Note (Signed)
On Lipitor 40 mg QD Check lipid profile

## 2024-02-28 NOTE — Assessment & Plan Note (Signed)
S/p left lumpectomy and adjuvant radiation On Anastrazole Followed by Oncology 

## 2024-02-28 NOTE — Assessment & Plan Note (Addendum)
 BP Readings from Last 1 Encounters:  02/28/24 135/83   Well-controlled with losartan  100 mg QD, hydrochlorothiazide  25 mg once daily, Spironolactone  50 mg once daily, and clonidine  0.2 mg qHS Dizziness improved since stopping morning dose of clonidine  Counseled for compliance with the medications Advised DASH diet and moderate exercise/walking, at least 150 mins/week

## 2024-02-29 ENCOUNTER — Ambulatory Visit: Payer: Self-pay | Admitting: Internal Medicine

## 2024-02-29 LAB — CBC WITH DIFFERENTIAL/PLATELET
Basophils Absolute: 0 x10E3/uL (ref 0.0–0.2)
Basos: 1 %
EOS (ABSOLUTE): 0.1 x10E3/uL (ref 0.0–0.4)
Eos: 2 %
Hematocrit: 38 % (ref 34.0–46.6)
Hemoglobin: 12.3 g/dL (ref 11.1–15.9)
Immature Grans (Abs): 0 x10E3/uL (ref 0.0–0.1)
Immature Granulocytes: 0 %
Lymphocytes Absolute: 2.1 x10E3/uL (ref 0.7–3.1)
Lymphs: 31 %
MCH: 27.8 pg (ref 26.6–33.0)
MCHC: 32.4 g/dL (ref 31.5–35.7)
MCV: 86 fL (ref 79–97)
Monocytes Absolute: 0.4 x10E3/uL (ref 0.1–0.9)
Monocytes: 6 %
Neutrophils Absolute: 3.9 x10E3/uL (ref 1.4–7.0)
Neutrophils: 60 %
Platelets: 243 x10E3/uL (ref 150–450)
RBC: 4.43 x10E6/uL (ref 3.77–5.28)
RDW: 12.4 % (ref 11.7–15.4)
WBC: 6.6 x10E3/uL (ref 3.4–10.8)

## 2024-02-29 LAB — CMP14+EGFR
ALT: 12 IU/L (ref 0–32)
AST: 18 IU/L (ref 0–40)
Albumin: 4 g/dL (ref 3.9–4.9)
Alkaline Phosphatase: 91 IU/L (ref 49–135)
BUN/Creatinine Ratio: 20 (ref 12–28)
BUN: 22 mg/dL (ref 8–27)
Bilirubin Total: 0.4 mg/dL (ref 0.0–1.2)
CO2: 24 mmol/L (ref 20–29)
Calcium: 9.7 mg/dL (ref 8.7–10.3)
Chloride: 100 mmol/L (ref 96–106)
Creatinine, Ser: 1.08 mg/dL — ABNORMAL HIGH (ref 0.57–1.00)
Globulin, Total: 3 g/dL (ref 1.5–4.5)
Glucose: 89 mg/dL (ref 70–99)
Potassium: 4.2 mmol/L (ref 3.5–5.2)
Sodium: 141 mmol/L (ref 134–144)
Total Protein: 7 g/dL (ref 6.0–8.5)
eGFR: 55 mL/min/1.73 — ABNORMAL LOW (ref >59)

## 2024-02-29 LAB — LIPID PANEL
Chol/HDL Ratio: 2.9 ratio (ref 0.0–4.4)
Cholesterol, Total: 141 mg/dL (ref 100–199)
HDL: 49 mg/dL (ref >39)
LDL Chol Calc (NIH): 79 mg/dL (ref 0–99)
Triglycerides: 64 mg/dL (ref 0–149)
VLDL Cholesterol Cal: 13 mg/dL (ref 5–40)

## 2024-02-29 LAB — VITAMIN D 25 HYDROXY (VIT D DEFICIENCY, FRACTURES): Vit D, 25-Hydroxy: 48.1 ng/mL (ref 30.0–100.0)

## 2024-03-01 LAB — MICROALBUMIN / CREATININE URINE RATIO
Creatinine, Urine: 138.9 mg/dL
Microalb/Creat Ratio: 5 mg/g{creat} (ref 0–29)
Microalbumin, Urine: 7.1 ug/mL

## 2024-03-25 NOTE — Progress Notes (Signed)
 Frances Hughes                                          MRN: [id number]   03/25/2024   The [facility] Quality Team Specialist reviewed this patient medical record for the purposes of chart review for care gap closure. The following were reviewed: chart review for care gap closure-kidney health evaluation for diabetes:eGFR  and uACR.    [facility] Quality Team

## 2024-03-26 ENCOUNTER — Ambulatory Visit
Admission: RE | Admit: 2024-03-26 | Discharge: 2024-03-26 | Disposition: A | Source: Ambulatory Visit | Attending: Internal Medicine

## 2024-03-26 DIAGNOSIS — Z1231 Encounter for screening mammogram for malignant neoplasm of breast: Secondary | ICD-10-CM

## 2024-04-25 ENCOUNTER — Other Ambulatory Visit: Payer: Self-pay | Admitting: Internal Medicine

## 2024-04-25 DIAGNOSIS — R600 Localized edema: Secondary | ICD-10-CM

## 2024-05-01 ENCOUNTER — Other Ambulatory Visit: Payer: Self-pay | Admitting: "Endocrinology

## 2024-05-01 DIAGNOSIS — E782 Mixed hyperlipidemia: Secondary | ICD-10-CM

## 2024-05-07 ENCOUNTER — Other Ambulatory Visit: Payer: Self-pay | Admitting: "Endocrinology

## 2024-05-14 ENCOUNTER — Other Ambulatory Visit: Payer: Self-pay | Admitting: "Endocrinology

## 2024-06-20 ENCOUNTER — Ambulatory Visit: Admitting: "Endocrinology

## 2024-06-20 ENCOUNTER — Encounter: Payer: Self-pay | Admitting: "Endocrinology

## 2024-06-20 VITALS — BP 118/76 | HR 68 | Ht 63.0 in | Wt 194.4 lb

## 2024-06-20 DIAGNOSIS — I10 Essential (primary) hypertension: Secondary | ICD-10-CM

## 2024-06-20 DIAGNOSIS — M816 Localized osteoporosis [Lequesne]: Secondary | ICD-10-CM

## 2024-06-20 DIAGNOSIS — Z7985 Long-term (current) use of injectable non-insulin antidiabetic drugs: Secondary | ICD-10-CM

## 2024-06-20 DIAGNOSIS — E782 Mixed hyperlipidemia: Secondary | ICD-10-CM

## 2024-06-20 DIAGNOSIS — E890 Postprocedural hypothyroidism: Secondary | ICD-10-CM

## 2024-06-20 DIAGNOSIS — D3500 Benign neoplasm of unspecified adrenal gland: Secondary | ICD-10-CM

## 2024-06-20 DIAGNOSIS — E119 Type 2 diabetes mellitus without complications: Secondary | ICD-10-CM

## 2024-06-20 LAB — POCT GLYCOSYLATED HEMOGLOBIN (HGB A1C): HbA1c, POC (controlled diabetic range): 5.7 % (ref 0.0–7.0)

## 2024-06-20 MED ORDER — TIRZEPATIDE 10 MG/0.5ML ~~LOC~~ SOAJ: 10.0000 mg | SUBCUTANEOUS | 1 refills | Status: AC

## 2024-06-20 MED ORDER — LEVOTHYROXINE SODIUM 112 MCG PO TABS: 112.0000 ug | ORAL_TABLET | Freq: Every day | ORAL | 1 refills | Status: AC

## 2024-06-20 NOTE — Patient Instructions (Signed)

## 2024-06-20 NOTE — Progress Notes (Signed)
 "                                                     06/20/2024, 1:19 PM  Endocrinology follow-up note   Subjective:    [name] No, female    DOB: [birth year], PCP Tobie Suzzane POUR, MD   Past Medical History:  Diagnosis Date   Adrenal mass, left    Age-related osteoporosis without current pathological fracture 10/12/2023   Arthritis    Breast cancer (HCC)    Degenerative disc disease at L5-S1 level    Depression 07/15/2011   DM (diabetes mellitus) (HCC)    Type II controlled   Enlarged heart    Family history of breast cancer    Family history of colon cancer    Family history of pancreatic cancer    Family history of stomach cancer    Grave's disease    History of kidney stones    HTN (hypertension)    Hyperlipidemia    Hypothyroidism    Lymphocytosis    Nephrolithiasis    OSA on CPAP    Past Surgical History:  Procedure Laterality Date   ABDOMINAL HYSTERECTOMY     BREAST BIOPSY Left 01/06/2020   BREAST LUMPECTOMY WITH RADIOACTIVE SEED LOCALIZATION Left 02/19/2020   Procedure: LEFT BREAST LUMPECTOMY WITH RADIOACTIVE SEED LOCALIZATION;  Surgeon: Vernetta Berg, MD;  Location: [facility];  Service: General;  Laterality: Left;   COLONOSCOPY  2011   normal colonoscopy   COLONOSCOPY WITH PROPOFOL  N/A 04/16/2018   Procedure: COLONOSCOPY WITH PROPOFOL ;  Surgeon: Shaaron Lamar HERO, MD;  [facility];  Service: Endoscopy;  Laterality: N/A;  9:45am   COLONOSCOPY WITH PROPOFOL  N/A 01/26/2022   Procedure: COLONOSCOPY WITH PROPOFOL ;  Surgeon: Shaaron Lamar HERO, MD;  [facility]: [facility];  Service: Endoscopy;  Laterality: N/A;  11:15 am   HERNIA REPAIR  2011   LITHOTRIPSY     POLYPECTOMY  04/16/2018   Procedure: POLYPECTOMY;  Surgeon: Shaaron Lamar HERO, MD;  [facility]: [facility];  Service: Endoscopy;;  (colon)   POLYPECTOMY  01/26/2022   Procedure: POLYPECTOMY;  Surgeon: Shaaron Lamar HERO, MD;  Location: [facility];  Service:  Endoscopy;;   S/P Hysterectomy  10/1996   with bilat SOO seconardt to fibroids   thyroid  ablation  1986   Social History   Socioeconomic History   Marital status: Married    Spouse name: Tomesha Sargent   Number of children: Not on file   Years of education: 12   Highest education level: Bachelor's degree (e.g., BA, AB, BS)  Occupational History   Not on file  Tobacco Use   Smoking status: Former    Current packs/day: 0.00    Average packs/day: 0.8 packs/day for 19.3 years (14.5 ttl pk-yrs)    Types: Cigarettes    Start date: 01/30/1969    Quit date: 05/23/1988    Years since quitting: 36.1    Passive exposure: Past   Smokeless tobacco: Never   Tobacco comments:    I have not smoked in over 34 years  Vaping Use   Vaping status: Never Used  Substance and Sexual Activity   Alcohol use: No   Drug use: No   Sexual activity: Yes    Partners: Male    Comment: Hysterectomy  Other Topics  Concern   Not on file  Social History Narrative   Not on file   Social Drivers of Health   Tobacco Use: Medium Risk (06/20/2024)   Patient History    Smoking Tobacco Use: Former    Smokeless Tobacco Use: Never    Passive Exposure: Past  Physicist, Medical Strain: Low Risk (02/26/2024)   Overall Financial Resource Strain (CARDIA)    Difficulty of Paying Living Expenses: Not very hard  Food Insecurity: No Food Insecurity (02/26/2024)   Epic    Worried About Programme Researcher, Broadcasting/film/video in the Last Year: Never true    Ran Out of Food in the Last Year: Never true  Transportation Needs: No Transportation Needs (02/26/2024)   Epic    Lack of Transportation (Medical): No    Lack of Transportation (Non-Medical): No  Physical Activity: Sufficiently Active (02/26/2024)   Exercise Vital Sign    Days of Exercise per Week: 3 days    Minutes of Exercise per Session: 120 min  Stress: No Stress Concern Present (02/26/2024)   [employer] of Occupational Health - Occupational Stress Questionnaire    Feeling  of Stress: Only a little  Social Connections: Socially Integrated (02/26/2024)   Social Connection and Isolation Panel    Frequency of Communication with Friends and Family: More than three times a week    Frequency of Social Gatherings with Friends and Family: More than three times a week    Attends Religious Services: More than 4 times per year    Active Member of Clubs or Organizations: Yes    Attends Banker Meetings: More than 4 times per year    Marital Status: Married  Depression (PHQ2-9): Low Risk (02/28/2024)   Depression (PHQ2-9)    PHQ-2 Score: 0  Alcohol Screen: Low Risk (01/08/2024)   Alcohol Screen    Last Alcohol Screening Score (AUDIT): 0  Housing: Low Risk (02/26/2024)   Epic    Unable to Pay for Housing in the Last Year: No    Number of Times Moved in the Last Year: 0    Homeless in the Last Year: No  Utilities: Not At Risk (01/08/2024)   Epic    Threatened with loss of utilities: No  Health Literacy: Adequate Health Literacy (01/08/2024)   B1300 Health Literacy    Frequency of need for help with medical instructions: Never   Family History  Problem Relation Age of Onset   Colon cancer Mother        d. 61   Cancer Mother        metastisis sarcoma   Colon cancer Sister 54   COPD Sister    Head & neck cancer Sister        roof of mouth   Colon polyps Sister    Diabetes Father    Hypertension Father    Cancer Father    Lung cancer Father        d. 26   Colon polyps Sister    Obesity Sister    Cancer Sister    Esophageal cancer Brother    Cancer Brother        cancer of eye   Aneurysm Maternal Aunt        brain   Cancer Maternal Uncle        NOS   COPD Maternal Uncle    Stomach cancer Maternal Grandfather 76   Heart attack Maternal Grandfather    Pancreatic cancer Paternal Grandmother  d. 28   Cancer Maternal Uncle        NOS - mother's pat 1/2 brother   Breast cancer Cousin        3 maternal cousins with breast cancer, some  under 50, all different branches   Colon cancer Cousin        five mat first cousins, 4 were siblings   Cancer Cousin        2 mat 1/2 cousins with NOS cancer   Cancer Brother    Outpatient Encounter Medications as of 06/20/2024  Medication Sig   tirzepatide  (MOUNJARO ) 10 MG/0.5ML Pen Inject 10 mg into the skin once a week.   alendronate  (FOSAMAX ) 70 MG tablet Take 1 tablet (70 mg total) by mouth every 7 (seven) days. Take with a full glass of water  on an empty stomach.   anastrozole  (ARIMIDEX ) 1 MG tablet TAKE 1 TABLET(1 MG) BY MOUTH DAILY. START 06/08/2020   aspirin  81 MG EC tablet Take 81 mg by mouth 3 (three) times a week. Swallow whole.   atorvastatin  (LIPITOR) 40 MG tablet TAKE 1 TABLET(40 MG) BY MOUTH DAILY   Blood Glucose Monitoring Suppl (BLOOD GLUCOSE MONITOR SYSTEM) W/DEVICE KIT Please dispense based on patient and insurance preference. Use to monitor fasting blood sugar 1x daily. Dx: E11.9   Blood Glucose Monitoring Suppl DEVI 1 each by Does not apply route in the morning, at noon, and at bedtime. May substitute to any manufacturer covered by patient's insurance.   Blood Pressure Monitoring (BLOOD PRESSURE MONITOR 3) DEVI Check blood pressure once daily 1 hour after taking blood pressure medication   cloNIDine  (CATAPRES ) 0.2 MG tablet Take 1 tablet (0.2 mg total) by mouth every evening.   furosemide  (LASIX ) 20 MG tablet TAKE 1 TABLET(20 MG) BY MOUTH DAILY AS NEEDED   hydrochlorothiazide  (HYDRODIURIL ) 25 MG tablet TAKE 1 TABLET(25 MG) BY MOUTH DAILY WITH BREAKFAST   hydrocortisone  (ANUSOL -HC) 25 MG suppository Place 1 suppository (25 mg total) rectally 2 (two) times daily.   KLOR-CON  M20 20 MEQ tablet TAKE 1 TABLET(20 MEQ) BY MOUTH TWICE DAILY   Lancets 28G MISC Please dispense based on patient and insurance preference. Use to monitor fasting blood sugar 1x daily. Dx: E11.9   levothyroxine  (SYNTHROID ) 112 MCG tablet Take 1 tablet (112 mcg total) by mouth daily before breakfast.    losartan  (COZAAR ) 100 MG tablet TAKE 1 TABLET(100 MG) BY MOUTH DAILY   Multiple Vitamin (MULTIVITAMIN WITH MINERALS) TABS tablet Take 1 tablet by mouth daily.   polyethylene glycol powder (MIRALAX ) 17 GM/SCOOP powder Take 17 g by mouth 2 (two) times daily as needed for moderate constipation.   senna-docusate (SENOKOT-S) 8.6-50 MG tablet Take 2 tablets by mouth at bedtime as needed for mild constipation.   spironolactone  (ALDACTONE ) 50 MG tablet TAKE 1 TABLET(50 MG) BY MOUTH DAILY   [DISCONTINUED] levothyroxine  (SYNTHROID ) 100 MCG tablet TAKE 1 TABLET(100 MCG) BY MOUTH DAILY BEFORE BREAKFAST   [DISCONTINUED] tirzepatide  (MOUNJARO ) 7.5 MG/0.5ML Pen Inject 7.5 mg into the skin once a week.   No facility-administered encounter medications on file as of 06/20/2024.   ALLERGIES: No Known Allergies  VACCINATION STATUS: Immunization History  Administered Date(s) Administered   Fluad Quad(high Dose 65+) 02/08/2019, 03/05/2020, 02/09/2022   Fluad Trivalent(High Dose 65+) 02/13/2023   INFLUENZA, HIGH DOSE SEASONAL PF 02/28/2024   Influenza-Unspecified 03/21/2011, 03/20/2015   Moderna Sars-Covid-2 Vaccination 07/08/2019, 07/31/2019   PNEUMOCOCCAL CONJUGATE-20 09/29/2021   Pneumococcal Polysaccharide-23 04/13/2007, 03/05/2020   Tdap 09/14/2012    HPI  KRYSTELLE PRASHAD is 69 y.o. female who returns for follow-up with previsit labs.  She was initially seen in consultation for  left adrenal adenoma requested by Tobie Suzzane POUR, MD. 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  She has a long and complicated medical history.  See notes from previous visits.     she has history of hypertension on multiple medications.  She is currently on clonidine  0.2 mg once a day, hydrochlorothiazide  25 mg p.o. daily, losartan  100 mg p.o. daily, spironolactone  50 mg p.o. daily.    Her blood pressure is controlled to target. Her previous workup with aldosterone,  plasma renin activity were not conclusive for primary hyperaldosteronism.    She also has history of Graves' disease which required radioactive iodine thyroid  ablation in 2006.  She is currently on levothyroxine  100 mcg p.o. daily before breakfast.  Her previsit thyroid  function tests are consistent with slight under replacement.    she reports compliance and consistency.  She continues to feel better, has no new complaints today.  She has type 2 diabetes being managed with Mounjaro  and metformin .  She has achieved significant weight loss, wishes to achieve some more weight loss.  Her point-of-care A1c is 5.7%, progressively improving.    She is known to have left adrenal mass at least since September 2005.  She has a series of abdominal and thoracic imaging studies since then and it was documented that this lesion was stable and displays features consistent with benign adenoma.  During her last abdominal CT in 2016 it measured 3.5 cm (3.2 cm in 2007) and the lesion demonstrated washout characteristics consistent with benign adrenal adenoma with absolute washout of 93%.  The right adrenal glands were normal. Over the years, she underwent various endocrine work-ups for this adenoma and none of them were significant for functioning lesion. Most recently in October 2021 she underwent 24-hour urine collection for metanephrines which were normal.  Her free plasma metanephrines were normal.  Her potassium is lately stable with current potassium supplement 20 mEq twice a day.   She is not on regular diuresis with Lasix  anymore, she uses Lasix  20 mg p.o. as needed.  She was recently diagnosed with breast cancer s/p chemo/surgery.      She has sleep apnea on CPAP machine. She does not consume licorice, no family history of premature coronary artery disease, CVA, adrenal, thyroid , parathyroid, pituitary dysfunctions.  Review of Systems  Minimally fluctuating body weight.  Objective:       06/20/2024    11:03 AM 02/28/2024    8:57 AM 01/08/2024    9:17 AM  Vitals with BMI  Height 5' 3 5' 3 5' 3  Weight 194 lbs 6 oz 202 lbs 6 oz 219 lbs  BMI 34.44 35.86 38.8  Systolic 118 135 --  Diastolic 76 83 --  Pulse 68 79     BP 118/76   Pulse 68   Ht 5' 3 (1.6 m)   Wt 194 lb 6.4 oz (88.2 kg)   BMI 34.44 kg/m   Wt Readings from Last 3 Encounters:  06/20/24 194 lb 6.4 oz (88.2 kg)  02/28/24 202 lb 6.4 oz (91.8 kg)  01/08/24 219 lb (99.3 kg)    Physical Exam She has bilateral peripheral edema.  CMP ( most recent) CMP     Component Value Date/Time   NA 139 06/19/2024 1113   K 4.3 06/19/2024 1113   CL 100  06/19/2024 1113   CO2 26 06/19/2024 1113   GLUCOSE 82 06/19/2024 1113   GLUCOSE 96 07/31/2023 1143   BUN 19 06/19/2024 1113   CREATININE 1.03 (H) 06/19/2024 1113   CREATININE 0.86 03/08/2022 1244   CREATININE 0.68 06/12/2020 0930   CALCIUM  9.1 06/19/2024 1113   PROT 6.7 06/19/2024 1113   ALBUMIN 3.8 (L) 06/19/2024 1113   AST 16 06/19/2024 1113   AST 12 (L) 03/08/2022 1244   ALT 8 06/19/2024 1113   ALT 9 03/08/2022 1244   ALKPHOS 75 06/19/2024 1113   BILITOT 0.5 06/19/2024 1113   BILITOT 0.4 03/08/2022 1244   GFRNONAA >60 07/31/2023 1143   GFRNONAA >60 03/08/2022 1244   GFRNONAA 94 11/07/2018 0927   GFRAA >60 02/17/2020 0930   GFRAA 109 11/07/2018 0927     Diabetic Labs (most recent): Lab Results  Component Value Date   HGBA1C 5.7 06/20/2024   HGBA1C 5.9 12/19/2023   HGBA1C 5.8 08/08/2023   MICROALBUR 5.3 09/09/2019   MICROALBUR 2.2 11/07/2018   MICROALBUR 0.3 04/03/2015     Lipid Panel ( most recent) Lipid Panel     Component Value Date/Time   CHOL 168 06/19/2024 1113   TRIG 54 06/19/2024 1113   HDL 55 06/19/2024 1113   CHOLHDL 3.1 06/19/2024 1113   CHOLHDL 3.3 05/03/2023 1030   VLDL 10 05/03/2023 1030   LDLCALC 102 (H) 06/19/2024 1113   LDLCALC 107 (H) 06/12/2020 0930   LDLDIRECT 110 (H) 01/21/2012 0950   LABVLDL 11 06/19/2024 1113      Recent Results (from the past 2160 hours)  Comprehensive metabolic panel with GFR     Status: Abnormal   Collection Time: 06/19/24 11:13 AM  Result Value Ref Range   Glucose 82 70 - 99 mg/dL   BUN 19 8 - 27 mg/dL   Creatinine, Ser 8.96 (H) 0.57 - 1.00 mg/dL   eGFR 58 (L) >40 fO/fpw/8.26   BUN/Creatinine Ratio 18 12 - 28   Sodium 139 134 - 144 mmol/L   Potassium 4.3 3.5 - 5.2 mmol/L   Chloride 100 96 - 106 mmol/L   CO2 26 20 - 29 mmol/L   Calcium  9.1 8.7 - 10.3 mg/dL   Total Protein 6.7 6.0 - 8.5 g/dL   Albumin 3.8 (L) 3.9 - 4.9 g/dL   Globulin, Total 2.9 1.5 - 4.5 g/dL   Bilirubin Total 0.5 0.0 - 1.2 mg/dL   Alkaline Phosphatase 75 49 - 135 IU/L   AST 16 0 - 40 IU/L   ALT 8 0 - 32 IU/L  Lipid panel     Status: Abnormal   Collection Time: 06/19/24 11:13 AM  Result Value Ref Range   Cholesterol, Total 168 100 - 199 mg/dL   Triglycerides 54 0 - 149 mg/dL   HDL 55 >60 mg/dL   VLDL Cholesterol Cal 11 5 - 40 mg/dL   LDL Chol Calc (NIH) 897 (H) 0 - 99 mg/dL   Chol/HDL Ratio 3.1 0.0 - 4.4 ratio    Comment:                                   T. Chol/HDL Ratio  Men  Women                               1/2 Avg.Risk  3.4    3.3                                   Avg.Risk  5.0    4.4                                2X Avg.Risk  9.6    7.1                                3X Avg.Risk 23.4   11.0   TSH     Status: Abnormal   Collection Time: 06/19/24 11:13 AM  Result Value Ref Range   TSH 5.190 (H) 0.450 - 4.500 uIU/mL  T4, free     Status: None   Collection Time: 06/19/24 11:13 AM  Result Value Ref Range   Free T4 1.69 0.82 - 1.77 ng/dL  FIB-4 W/REFLEX TO ELF     Status: None (Preliminary result)   Collection Time: 06/19/24 11:13 AM  Result Value Ref Range   Platelets 221 150 - 450 x10E3/uL   FIB-4 Index 1.79 0.00 - 2.67    Comment:       0.00 - 1.29 Low risk for advanced liver fibrosis       1.30 - 2.67 Indeterminate risk for  advanced liver                   fibrosis             >2.67 High risk for advanced fibrosis and                   for the development of other liver                   related events   Enhanced Liver Fibrosis (ELF)     Status: None (Preliminary result)   Collection Time: 06/19/24 11:13 AM  Result Value Ref Range   ELF(TM) Score WILL FOLLOW   HgB A1c     Status: None   Collection Time: 06/20/24 11:16 AM  Result Value Ref Range   Hemoglobin A1C     HbA1c POC (<> result, manual entry)     HbA1c, POC (prediabetic range)     HbA1c, POC (controlled diabetic range) 5.7 0.0 - 7.0 %     Assessment & Plan:   1. Adrenal adenoma- left -Her plasma free metanephrines were normal.  Review of all of her previous work-up points to a stable nonfunctioning left adrenal adenoma.  This adenoma is not contributing to her history of hypertension.   The adrenal lesion was documented to be stable since 2005.  she does not have paroxysmal symptoms to suggest pheochromocytoma.  Her recent work-up rules out  Cushing syndrome. Her recent work-up with aldosterone, plasma renin activity are not conclusive for primary hyperaldosteronism.  She will not need to surgical intervention for the adenoma at this time.     2.  Essential hypertension - Her blood pressure is controlled on multiple medications, advised to continue spironolactone  50 mg once daily, hydrochlorothiazide  25 mg p.o. daily,  losartan  100 mg p.o. daily at breakfast with his clonidine  0.2 mg once daily.     3.  RAI induced hypothyroidism  Regarding her RAI induced hypothyroidism: Her previsit thyroid  function tests are consistent with slight under replacement.  I discussed and increased her levothyroxine  112 mcg p.o. daily before breakfast.     - We discussed about the correct intake of her thyroid  hormone, on empty stomach at fasting, with water , separated by at least 30 minutes from breakfast and other medications,  and separated by more than 4  hours from calcium , iron, multivitamins, acid reflux medications (PPIs). -Patient is made aware of the fact that thyroid  hormone replacement is needed for life, dose to be adjusted by periodic monitoring of thyroid  function tests.  4.  Type 2 diabetes: Controlled with point-of-care A1c of 5.7% improving from 6.3% during her last visit.   -She is responding to her Mounjaro , no reported side effects.  She wishes to achieve some more weight loss.  I discussed and increased her Mounjaro  to 10 mg subcutaneously weekly.   Side effects and precautions discussed with her.  At this time I advised her to hold metformin  for now.    - she acknowledges that there is a room for improvement in her food and drink choices. - Suggestion is made for her to avoid simple carbohydrates  from her diet including Cakes, Sweet Desserts, Ice Cream, Soda (diet and regular), Sweet Tea, Candies, Chips, Cookies, Store Bought Juices, Alcohol , Artificial Sweeteners,  Coffee Creamer, and Sugar-free Products, Lemonade. This will help patient to have more stable blood glucose profile and potentially avoid unintended weight gain.   5) morbid obesity: Her engagement with lifestyle medicine is suboptimal.  She is responding to GLP-1 receptor agonists-see above.  She has achieved 50+ pounds of weight loss so far.   6)  hyperlipidemia:  Not controlled with LDL 102.  He is advised to continue Lipitor 40 mg p.o. nightly.    Side effects and precautions discussed with her.  She will have fasting lipid panel before her next visit.   7) osteoporosis-new diagnosis. - Patient denies any history of fragility fractures.  Her T-score was measured only at hip to be -2.5.  Her spine was excluded due to degenerative joint disease.  She was started on vitamin D  by her PMD.  She will also benefit from early intervention with anti-osteoporosis treatment.  Options were discussed with her.  She is initiated on Fosamax  70 mg p.o. weekly and she agrees.   Side effects and precautions discussed with her.  Her next bone density is due in April 2027.    - she is advised to seek and establish a primary care provider in town, Dr. Tobie was suggested for her primary care needs.  I spent  40  minutes in the care of the patient today including review of labs from CMP, Lipids, Thyroid  Function, Hematology (current and previous including abstractions from other facilities); face-to-face time discussing  her blood glucose readings/logs, discussing hypoglycemia and hyperglycemia episodes and symptoms, medications doses, her options of short and long term treatment based on the latest standards of care / guidelines;  discussion about incorporating lifestyle medicine;  and documenting the encounter. Risk reduction counseling performed per USPSTF guidelines to reduce  obesity and cardiovascular risk factors.     Please refer to Patient Instructions for Blood Glucose Monitoring and Insulin /Medications Dosing Guide  in media tab for additional information. Please  also refer to  Patient Self Inventory  in the Media  tab for reviewed elements of pertinent patient history.  Frances Hughes No participated in the discussions, expressed understanding, and voiced agreement with the above plans.  All questions were answered to her satisfaction. she is encouraged to contact clinic should she have any questions or concerns prior to her return visit. Dear Patient: Feel free to review your progress notes.  If you are reviewing this progress note and have questions about the meaning of /or medical terms being used, please make a note and address it at your next follow-up appointment.  Medical notes are meant to be a communication tool between medical professionals and require medical terms to be used for efficiency and insurance approval.    Follow up plan: Return in about 4 months (around 10/18/2024) for A1c -NV, F/U with no Labs.   Ranny Earl, MD [facility]  [facility] [address] [place], [place] [address] Phone: [phone]  Fax: [phone]     06/20/2024, 1:19 PM  This note was partially dictated with voice recognition software. Similar sounding words can be transcribed inadequately or may not  be corrected upon review.  "

## 2024-06-28 LAB — COMPREHENSIVE METABOLIC PANEL WITH GFR
ALT: 8 [IU]/L (ref 0–32)
AST: 16 [IU]/L (ref 0–40)
Albumin: 3.8 g/dL — ABNORMAL LOW (ref 3.9–4.9)
Alkaline Phosphatase: 75 [IU]/L (ref 49–135)
BUN/Creatinine Ratio: 18 (ref 12–28)
BUN: 19 mg/dL (ref 8–27)
Bilirubin Total: 0.5 mg/dL (ref 0.0–1.2)
CO2: 26 mmol/L (ref 20–29)
Calcium: 9.1 mg/dL (ref 8.7–10.3)
Chloride: 100 mmol/L (ref 96–106)
Creatinine, Ser: 1.03 mg/dL — ABNORMAL HIGH (ref 0.57–1.00)
Globulin, Total: 2.9 g/dL (ref 1.5–4.5)
Glucose: 82 mg/dL (ref 70–99)
Potassium: 4.3 mmol/L (ref 3.5–5.2)
Sodium: 139 mmol/L (ref 134–144)
Total Protein: 6.7 g/dL (ref 6.0–8.5)
eGFR: 58 mL/min/{1.73_m2} — ABNORMAL LOW

## 2024-06-28 LAB — ENHANCED LIVER FIBROSIS (ELF): ELF(TM) Score: 9.63

## 2024-06-28 LAB — LIPID PANEL
Chol/HDL Ratio: 3.1 ratio (ref 0.0–4.4)
Cholesterol, Total: 168 mg/dL (ref 100–199)
HDL: 55 mg/dL
LDL Chol Calc (NIH): 102 mg/dL — ABNORMAL HIGH (ref 0–99)
Triglycerides: 54 mg/dL (ref 0–149)
VLDL Cholesterol Cal: 11 mg/dL (ref 5–40)

## 2024-06-28 LAB — FIB-4 W/REFLEX TO ELF
FIB-4 Index: 1.79 (ref 0.00–2.67)
Platelets: 221 10*3/uL (ref 150–450)

## 2024-06-28 LAB — T4, FREE: Free T4: 1.69 ng/dL (ref 0.82–1.77)

## 2024-06-28 LAB — TSH: TSH: 5.19 u[IU]/mL — ABNORMAL HIGH (ref 0.450–4.500)

## 2024-08-28 ENCOUNTER — Ambulatory Visit: Admitting: Internal Medicine

## 2024-10-25 ENCOUNTER — Ambulatory Visit: Admitting: "Endocrinology

## 2025-01-08 ENCOUNTER — Ambulatory Visit
